# Patient Record
Sex: Female | Born: 1935 | Race: White | Hispanic: No | Smoking: Never smoker
Health system: Southern US, Community
[De-identification: ages and names within clinical notes are randomized; demographics above are authoritative.]

## PROBLEM LIST (undated history)

## (undated) DIAGNOSIS — D689 Coagulation defect, unspecified: Secondary | ICD-10-CM

## (undated) DIAGNOSIS — Z5189 Encounter for other specified aftercare: Secondary | ICD-10-CM

## (undated) DIAGNOSIS — K750 Abscess of liver: Secondary | ICD-10-CM

## (undated) DIAGNOSIS — I38 Endocarditis, valve unspecified: Secondary | ICD-10-CM

## (undated) DIAGNOSIS — I10 Essential (primary) hypertension: Secondary | ICD-10-CM

## (undated) DIAGNOSIS — E785 Hyperlipidemia, unspecified: Secondary | ICD-10-CM

## (undated) DIAGNOSIS — J189 Pneumonia, unspecified organism: Secondary | ICD-10-CM

## (undated) DIAGNOSIS — T7840XA Allergy, unspecified, initial encounter: Secondary | ICD-10-CM

## (undated) DIAGNOSIS — J45909 Unspecified asthma, uncomplicated: Secondary | ICD-10-CM

## (undated) DIAGNOSIS — F419 Anxiety disorder, unspecified: Secondary | ICD-10-CM

## (undated) DIAGNOSIS — S22009A Unspecified fracture of unspecified thoracic vertebra, initial encounter for closed fracture: Secondary | ICD-10-CM

## (undated) DIAGNOSIS — N3090 Cystitis, unspecified without hematuria: Secondary | ICD-10-CM

## (undated) DIAGNOSIS — M1990 Unspecified osteoarthritis, unspecified site: Secondary | ICD-10-CM

## (undated) DIAGNOSIS — M810 Age-related osteoporosis without current pathological fracture: Secondary | ICD-10-CM

## (undated) HISTORY — DX: Coagulation defect, unspecified: D68.9

## (undated) HISTORY — DX: Encounter for other specified aftercare: Z51.89

## (undated) HISTORY — DX: Anxiety disorder, unspecified: F41.9

## (undated) HISTORY — PX: COLONOSCOPY: SHX174

## (undated) HISTORY — PX: APPENDECTOMY: SHX54

## (undated) HISTORY — DX: Hyperlipidemia, unspecified: E78.5

## (undated) HISTORY — PX: FRACTURE SURGERY: SHX138

## (undated) HISTORY — DX: Unspecified asthma, uncomplicated: J45.909

## (undated) HISTORY — DX: Abscess of liver: K75.0

## (undated) HISTORY — PX: SHOULDER FUSION SURGERY: SHX775

## (undated) HISTORY — DX: Unspecified osteoarthritis, unspecified site: M19.90

## (undated) HISTORY — DX: Allergy, unspecified, initial encounter: T78.40XA

## (undated) HISTORY — DX: Age-related osteoporosis without current pathological fracture: M81.0

## (undated) HISTORY — DX: Endocarditis, valve unspecified: I38

---

## 2013-12-10 ENCOUNTER — Encounter (HOSPITAL_COMMUNITY): Payer: Self-pay | Admitting: Emergency Medicine

## 2013-12-10 ENCOUNTER — Emergency Department (HOSPITAL_COMMUNITY): Payer: Medicare Other

## 2013-12-10 ENCOUNTER — Ambulatory Visit (HOSPITAL_COMMUNITY): Payer: Medicare Other

## 2013-12-10 ENCOUNTER — Inpatient Hospital Stay (HOSPITAL_COMMUNITY)
Admission: EM | Admit: 2013-12-10 | Discharge: 2013-12-13 | DRG: 194 | Disposition: A | Discharge status: Home / Self Care | Payer: Medicare Other | Attending: Internal Medicine | Admitting: Internal Medicine

## 2013-12-10 DIAGNOSIS — I10 Essential (primary) hypertension: Secondary | ICD-10-CM | POA: Diagnosis present

## 2013-12-10 DIAGNOSIS — J1289 Other viral pneumonia: Secondary | ICD-10-CM | POA: Diagnosis present

## 2013-12-10 DIAGNOSIS — J101 Influenza due to other identified influenza virus with other respiratory manifestations: Secondary | ICD-10-CM | POA: Diagnosis present

## 2013-12-10 DIAGNOSIS — R112 Nausea with vomiting, unspecified: Secondary | ICD-10-CM

## 2013-12-10 DIAGNOSIS — E871 Hypo-osmolality and hyponatremia: Secondary | ICD-10-CM | POA: Diagnosis present

## 2013-12-10 DIAGNOSIS — Z79899 Other long term (current) drug therapy: Secondary | ICD-10-CM

## 2013-12-10 DIAGNOSIS — D72829 Elevated white blood cell count, unspecified: Secondary | ICD-10-CM | POA: Diagnosis present

## 2013-12-10 DIAGNOSIS — R197 Diarrhea, unspecified: Secondary | ICD-10-CM

## 2013-12-10 DIAGNOSIS — J1000 Influenza due to other identified influenza virus with unspecified type of pneumonia: Principal | ICD-10-CM | POA: Diagnosis present

## 2013-12-10 DIAGNOSIS — J159 Unspecified bacterial pneumonia: Secondary | ICD-10-CM | POA: Diagnosis present

## 2013-12-10 DIAGNOSIS — J189 Pneumonia, unspecified organism: Secondary | ICD-10-CM | POA: Diagnosis present

## 2013-12-10 HISTORY — DX: Essential (primary) hypertension: I10

## 2013-12-10 HISTORY — DX: Pneumonia, unspecified organism: J18.9

## 2013-12-10 HISTORY — DX: Cystitis, unspecified without hematuria: N30.90

## 2013-12-10 LAB — COMPREHENSIVE METABOLIC PANEL
ALT: 32 U/L (ref 0–35)
AST: 40 U/L — ABNORMAL HIGH (ref 0–37)
CO2: 26 mEq/L (ref 19–32)
Calcium: 9 mg/dL (ref 8.4–10.5)
Chloride: 89 mEq/L — ABNORMAL LOW (ref 96–112)
GFR calc non Af Amer: 78 mL/min — ABNORMAL LOW (ref >90)
Sodium: 126 mEq/L — ABNORMAL LOW (ref 135–145)

## 2013-12-10 LAB — URINE MICROSCOPIC-ADD ON

## 2013-12-10 LAB — CBC WITH DIFFERENTIAL/PLATELET
Eosinophils Relative: 0 % (ref 0–5)
Lymphocytes Relative: 6 % — ABNORMAL LOW (ref 12–46)
Monocytes Absolute: 1.4 10*3/uL — ABNORMAL HIGH (ref 0.1–1.0)
Monocytes Relative: 5 % (ref 3–12)
Neutro Abs: 24 10*3/uL — ABNORMAL HIGH (ref 1.7–7.7)
Neutrophils Relative %: 89 % — ABNORMAL HIGH (ref 43–77)
Platelets: 269 10*3/uL (ref 150–400)
RBC: 3.16 MIL/uL — ABNORMAL LOW (ref 3.87–5.11)
WBC: 27 10*3/uL — ABNORMAL HIGH (ref 4.0–10.5)

## 2013-12-10 LAB — URINALYSIS, ROUTINE W REFLEX MICROSCOPIC
Bilirubin Urine: NEGATIVE
Specific Gravity, Urine: 1.018 (ref 1.005–1.030)
Urobilinogen, UA: 1 mg/dL (ref 0.0–1.0)
pH: 6.5 (ref 5.0–8.0)

## 2013-12-10 LAB — INFLUENZA PANEL BY PCR (TYPE A & B): Influenza B By PCR: NEGATIVE

## 2013-12-10 MED ORDER — SODIUM CHLORIDE 0.9 % IV SOLN
INTRAVENOUS | Status: DC
  Administered 2013-12-10 – 2013-12-12 (×4): via INTRAVENOUS

## 2013-12-10 MED ORDER — ALBUTEROL SULFATE (2.5 MG/3ML) 0.083% IN NEBU
2.5000 mg | INHALATION_SOLUTION | Freq: Four times a day (QID) | RESPIRATORY_TRACT | Status: DC
  Administered 2013-12-10: 2.5 mg via RESPIRATORY_TRACT
  Filled 2013-12-10 (×2): qty 3

## 2013-12-10 MED ORDER — ENOXAPARIN SODIUM 40 MG/0.4ML ~~LOC~~ SOLN
40.0000 mg | SUBCUTANEOUS | Status: DC
  Administered 2013-12-10 – 2013-12-12 (×3): 40 mg via SUBCUTANEOUS
  Filled 2013-12-10 (×4): qty 0.4

## 2013-12-10 MED ORDER — HYDROCOD POLST-CHLORPHEN POLST 10-8 MG/5ML PO LQCR
5.0000 mL | Freq: Every day | ORAL | Status: DC
  Filled 2013-12-10 (×2): qty 5

## 2013-12-10 MED ORDER — SODIUM CHLORIDE 0.9 % IV SOLN: INTRAVENOUS | Status: DC

## 2013-12-10 MED ORDER — METOPROLOL SUCCINATE ER 25 MG PO TB24
25.0000 mg | ORAL_TABLET | Freq: Every day | ORAL | Status: DC
  Administered 2013-12-10 – 2013-12-13 (×4): 25 mg via ORAL
  Filled 2013-12-10 (×5): qty 1

## 2013-12-10 MED ORDER — DOXYCYCLINE HYCLATE 100 MG IV SOLR
100.0000 mg | Freq: Two times a day (BID) | INTRAVENOUS | Status: DC
  Administered 2013-12-10 – 2013-12-12 (×6): 100 mg via INTRAVENOUS
  Filled 2013-12-10 (×10): qty 100

## 2013-12-10 MED ORDER — NITROFURANTOIN MACROCRYSTAL 50 MG PO CAPS
50.0000 mg | ORAL_CAPSULE | ORAL | Status: DC
  Filled 2013-12-10: qty 1

## 2013-12-10 MED ORDER — ONDANSETRON HCL 4 MG/2ML IJ SOLN
4.0000 mg | Freq: Once | INTRAMUSCULAR | Status: AC
  Administered 2013-12-10: 4 mg via INTRAVENOUS
  Filled 2013-12-10: qty 2

## 2013-12-10 MED ORDER — OSELTAMIVIR PHOSPHATE 75 MG PO CAPS
75.0000 mg | ORAL_CAPSULE | Freq: Every day | ORAL | Status: DC
  Administered 2013-12-11: 75 mg via ORAL
  Filled 2013-12-10 (×3): qty 1

## 2013-12-10 MED ORDER — ONDANSETRON HCL 4 MG PO TABS: 4.0000 mg | ORAL_TABLET | Freq: Four times a day (QID) | ORAL | Status: DC | PRN

## 2013-12-10 MED ORDER — SODIUM CHLORIDE 0.9 % IV BOLUS (SEPSIS)
1000.0000 mL | Freq: Once | INTRAVENOUS | Status: AC
  Administered 2013-12-10: 1000 mL via INTRAVENOUS

## 2013-12-10 MED ORDER — ONDANSETRON HCL 4 MG/2ML IJ SOLN: 4.0000 mg | Freq: Four times a day (QID) | INTRAMUSCULAR | Status: DC | PRN

## 2013-12-10 MED ORDER — METOCLOPRAMIDE HCL 5 MG/ML IJ SOLN: 10.0000 mg | Freq: Four times a day (QID) | INTRAMUSCULAR | Status: DC | PRN

## 2013-12-10 MED ORDER — GUAIFENESIN ER 600 MG PO TB12
1200.0000 mg | ORAL_TABLET | Freq: Two times a day (BID) | ORAL | Status: DC
  Administered 2013-12-10 – 2013-12-13 (×7): 1200 mg via ORAL
  Filled 2013-12-10 (×8): qty 2

## 2013-12-10 MED ORDER — PROCHLORPERAZINE EDISYLATE 5 MG/ML IJ SOLN
10.0000 mg | INTRAMUSCULAR | Status: DC | PRN
Start: 2013-12-10 — End: 2013-12-13
  Administered 2013-12-10 – 2013-12-12 (×3): 10 mg via INTRAVENOUS
  Filled 2013-12-10 (×3): qty 2

## 2013-12-10 MED ORDER — PROMETHAZINE HCL 25 MG PO TABS: 12.5000 mg | ORAL_TABLET | Freq: Four times a day (QID) | ORAL | Status: DC | PRN

## 2013-12-10 MED ORDER — DEXTROSE 5 % IV SOLN
1.0000 g | INTRAVENOUS | Status: DC
  Administered 2013-12-10 – 2013-12-12 (×3): 1 g via INTRAVENOUS
  Filled 2013-12-10 (×4): qty 10

## 2013-12-10 MED ORDER — AMLODIPINE BESYLATE 5 MG PO TABS
5.0000 mg | ORAL_TABLET | Freq: Every day | ORAL | Status: DC
  Administered 2013-12-10 – 2013-12-13 (×4): 5 mg via ORAL
  Filled 2013-12-10 (×5): qty 1

## 2013-12-10 MED ORDER — PROMETHAZINE HCL 25 MG/ML IJ SOLN
12.5000 mg | Freq: Four times a day (QID) | INTRAMUSCULAR | Status: DC | PRN
  Administered 2013-12-10 – 2013-12-11 (×2): 12.5 mg via INTRAVENOUS
  Filled 2013-12-10 (×3): qty 1

## 2013-12-10 MED ORDER — IOHEXOL 300 MG/ML  SOLN
80.0000 mL | Freq: Once | INTRAMUSCULAR | Status: AC | PRN
  Administered 2013-12-10: 80 mL via INTRAVENOUS

## 2013-12-10 MED ORDER — PROMETHAZINE HCL 12.5 MG RE SUPP
12.5000 mg | Freq: Four times a day (QID) | RECTAL | Status: DC | PRN
  Filled 2013-12-10: qty 2

## 2013-12-10 MED ORDER — HEPARIN SODIUM (PORCINE) 5000 UNIT/ML IJ SOLN
5000.0000 [IU] | Freq: Three times a day (TID) | INTRAMUSCULAR | Status: DC
  Filled 2013-12-10 (×4): qty 1

## 2013-12-10 MED ORDER — OSELTAMIVIR PHOSPHATE 75 MG PO CAPS
75.0000 mg | ORAL_CAPSULE | Freq: Two times a day (BID) | ORAL | Status: DC
  Administered 2013-12-10: 75 mg via ORAL
  Filled 2013-12-10 (×2): qty 1

## 2013-12-10 NOTE — Progress Notes (Signed)
[facility] PROGRESS NOTE  Shonna Deiter ZOX:[id number] DOB: July 03, 1924 DOA: 12/10/2013 PCP: No primary provider on file.  Assessment/Plan:  Influenza PCR positive Low grade fever Tami flu  Bilateral CAP Pneumonia Patient with history of pneumonia CT scan significant for multilobar ground glass opacities On Rocephin and Doxy.  Allergic to Azith. Added Nebs, Mucinex, Tussionex.  Pancreatic / Kidney lesions Being followed by an MD in [place].   Will send copy of CT with patient at discharge for comparison to previous scans.   DVT Prophylaxis: lovenox  Code Status: full Family Communication: Dtr at bedside Disposition Plan: From [place].  Visiting daughter locally.   Antibiotics: Doxy rocephin   HPI/Subjective: Patient improving.  "I felt fine at christmas and got bad SOO quickly"  Objective: Filed Vitals:   12/10/13 0303 12/10/13 0727  BP: 153/69 154/72  Pulse: 105 115  Temp: 99.4 F (37.4 C) 99.9 F (37.7 C)  TempSrc: Oral Oral  Resp: 16 20  Height: 5\' 6"  (1.676 m)   Weight: 58.968 kg (130 lb)   SpO2: 95% 93%    Intake/Output Summary (Last 24 hours) at 12/10/13 1313 Last data filed at 12/10/13 0830  Gross per 24 hour  Intake      0 ml  Output    200 ml  Net   -200 ml   Filed Weights   12/10/13 0303  Weight: 58.968 kg (130 lb)    Exam: General: Well developed, well nourished, NAD, appears stated age  HEENT:  PERR, EOMI, Anicteic Sclera, MMM. No pharyngeal erythema or exudates  Neck: Supple, no JVD, no masses  Cardiovascular: RRR, S1 S2 auscultated, no rubs, murmurs or gallops.   Respiratory: bilateral wheeze, no accessory muscle use.  Hacking cough. Abdomen: Soft, nontender, nondistended, + bowel sounds  Extremities: warm dry without cyanosis clubbing or edema.  Neuro: AAOx3, cranial nerves grossly intact. Strength 5/5 in upper and lower extremities  Skin: Without rashes exudates or nodules.   Psych: Normal affect and demeanor with intact  judgement and insight       Data Reviewed: Basic Metabolic Panel:  Recent Labs Lab 12/10/13 0440  NA 126*  K 3.9  CL 89*  CO2 26  GLUCOSE 123*  BUN 12  CREATININE 0.64  CALCIUM 9.0   Liver Function Tests:  Recent Labs Lab 12/10/13 0440  AST 40*  ALT 32  ALKPHOS 60  BILITOT 3.5*  PROT 6.8  ALBUMIN 3.2*   CBC:  Recent Labs Lab 12/10/13 0440  WBC 27.0*  NEUTROABS 24.0*  HGB 10.2*  HCT 29.3*  MCV 92.7  PLT 269     Studies: Dg Chest 2 View  12/10/2013   CLINICAL DATA:  Flu symptoms.  EXAM: CHEST  2 VIEW  COMPARISON:  None available for comparison at time of study interpretation.  FINDINGS: The cardiac silhouette appears mildly enlarged, mediastinal silhouette is nonsuspicious ; mildly calcified aortic knob. Moderate chronic interstitial changes. 13 mm nodular density projects in right upper lobe. Patchy airspace opacity in right middle lobe. No pneumothorax.  Patient is osteopenic. Soft tissue planes are nonsuspicious. Mild symmetric calcifications in the neck are likely vascular.  IMPRESSION: Right middle lobe airspace opacity may reflect pneumonia. Recommend followup chest radiograph after treatment to verify improvement.  Mild cardiomegaly and moderate chronic interstitial changes.  13 mm right upper lobe nodule would be better characterized on CT of the chest as clinically indicated ; if not performed, recommend close attention to this area on follow-up imaging.   Electronically Signed  By: Awilda Metro   On: 12/10/2013 04:15   Ct Chest W Contrast  12/10/2013   CLINICAL DATA:  Nausea, vomiting and diarrhea for 2 days. Influenza. Right middle lobe airspace disease and pulmonary nodule on chest radiographs.  EXAM: CT CHEST WITH CONTRAST  TECHNIQUE: Multidetector CT imaging of the chest was performed during intravenous contrast administration.  CONTRAST:  80mL OMNIPAQUE IOHEXOL 300 MG/ML  SOLN  COMPARISON:  Chest radiographs 12/10/2013.  FINDINGS: Small right  hilar and subcarinal lymph nodes are likely reactive. No pathologically enlarged mediastinal, hilar or axillary lymph nodes are identified. There is mild atherosclerosis of the aorta, great vessels and coronary arteries.  Minimal pleural fluid is present on the right. There is no significant left pleural or pericardial effusion.  There are patchy airspace and ground-glass opacities in both lungs with areas of consolidation in the right upper, middle and lower lobes as well as the lingula. There is a 1.4 cm nodular density in the right middle lobe on image number 36. In the right lower lobe, there is a 6 mm nodular density on image 47. No endobronchial lesion is identified.  Images through the upper abdomen demonstrate no abnormality of the liver or adrenal glands. There are low density lesions within the pancreatic tail measuring 6 mm on image 5 of series 5 and 13 mm on image 2 of series 7. There is another smaller lesion within the pancreatic body on image number 4 of series 7. There is a 1.4 cm low-density lesion in the interpolar region of the left kidney, measuring 23 HU on image 4 of series 9.  Mild thoracic spondylosis is noted. There are no worrisome osseous findings.  IMPRESSION: 1. There are patchy airspace and ground-glass opacities in both lungs most consistent with multilobar pneumonia. Radiographic follow up recommended. If these opacities fail to resolve, CT follow-up may be warranted. 2. Indeterminate low-density lesions within the pancreatic tail and left kidney. In this 79 year old, these findings are of questionable significance. Management options include in further characterization with abdominal MRI or CT follow up in 6 months (which could be performed in conjunction with follow-up chest CT) to assess stability.   Electronically Signed   By: Roxy Horseman M.D.   On: 12/10/2013 10:38    Scheduled Meds: . albuterol  2.5 mg Nebulization Q6H  . amLODipine  5 mg Oral Daily  . cefTRIAXone  (ROCEPHIN)  IV  1 g Intravenous Q24H  . chlorpheniramine-HYDROcodone  5 mL Oral QHS  . doxycycline (VIBRAMYCIN) IV  100 mg Intravenous Q12H  . enoxaparin (LOVENOX) injection  40 mg Subcutaneous Q24H  . guaiFENesin  1,200 mg Oral BID  . metoprolol succinate  25 mg Oral Daily  . oseltamivir  75 mg Oral BID   Continuous Infusions: . sodium chloride 125 mL/hr at 12/10/13 1204    Principal Problem:   Influenza A (H1N1) Active Problems:   Nausea vomiting and diarrhea   CAP (community acquired pneumonia)    Conley Canal  [facility] Pager [phone]. If 7PM-7AM, please contact night-coverage at [email or web address], password [facility] 12/10/2013, 1:13 PM  LOS: 0 days

## 2013-12-10 NOTE — ED Notes (Signed)
Pt presents with to ED, having been diagnosed with flu in [facility] 3 days ago, c/o n/v and weakness.

## 2013-12-10 NOTE — ED Notes (Signed)
LACTIC ACID RESULTS GIVEN TO EDP [place] FOR REVIEW

## 2013-12-10 NOTE — ED Notes (Signed)
Symptoms started on Saturday. DX flu.. N/V/D started thereafter. Unable to tolerate PO. Here to be evaluated

## 2013-12-10 NOTE — H&P (Signed)
[facility] History and Physical  Artha Stavros ZOX:[id number] DOB: 21-Dec-1923 DOA: 12/10/2013  Referring physician: EDP PCP: No primary provider on file.   Chief Complaint: Influenza, N/V/D   HPI: Brianna [name] is a 79 y.o. female who presents to the ED with uncontrolled N/V/D.  She has been having just over 48 hours of ILI symptoms including cough, rhinorrhea, sore throat.  No blood in stool or vomit, no melena.  This occurs in the context of laboratory confirmed influenza A H1N1 virus infection as seen in an ED in [place] yesterday.  Unfortunately, the pharmacy she tried to fill her tamiflu prescription at was out of tamiflu due to a massive outbreak of influenza in the local area.  Her N/V/D onset after receiving Levaquin in the ED yesterday and has been persistent since that time.  Levaquin was given to treat presumptive PNA seen on CXR.  Review of Systems: Systems reviewed.  As above, otherwise negative  Past Medical History  Diagnosis Date  . Hypertension   . Pneumonia   . Bladder infection    Past Surgical History  Procedure Laterality Date  . Joint replacement    . Appendectomy     Social History:  reports that she has never smoked. She does not have any smokeless tobacco history on file. She reports that she drinks alcohol. Her drug history is not on file.  Allergies  Allergen Reactions  . Erythromycin     "passed out"    No family history on file.   Prior to Admission medications   Medication Sig Start Date End Date Taking? Authorizing Provider  amLODipine (NORVASC) 5 MG tablet Take 5 mg by mouth daily.  10/01/13  Yes Historical Provider, MD  metoprolol succinate (TOPROL-XL) 25 MG 24 hr tablet Take 25 mg by mouth daily.  10/01/13  Yes Historical Provider, MD  nitrofurantoin (MACRODANTIN) 50 MG capsule Take 50 mg by mouth 3 (three) times a week. Take on Mondays Wednesday and Fridays-Name brand only   Yes Historical Provider, MD  ondansetron (ZOFRAN-ODT)  4 MG disintegrating tablet Take 4 mg by mouth every 8 (eight) hours as needed.  12/08/13  Yes Historical Provider, MD   Physical Exam: Filed Vitals:   12/10/13 0303  BP: 153/69  Pulse: 105  Temp: 99.4 F (37.4 C)  Resp: 16    BP 153/69  Pulse 105  Temp(Src) 99.4 F (37.4 C) (Oral)  Resp 16  Ht 5\' 6"  (1.676 m)  Wt 58.968 kg (130 lb)  BMI 20.99 kg/m2  SpO2 95%  General Appearance:    Alert, oriented, no distress, appears stated age  Head:    Normocephalic, atraumatic  Eyes:    PERRL, EOMI, sclera non-icteric        Nose:   Nares without drainage or epistaxis. Mucosa, turbinates normal  Throat:   Moist mucous membranes. Oropharynx without erythema or exudate.  Neck:   Supple. No carotid bruits.  No thyromegaly.  No lymphadenopathy.   Back:     No CVA tenderness, no spinal tenderness  Lungs:     Clear to auscultation bilaterally, without wheezes, rhonchi or rales  Chest wall:    No tenderness to palpitation  Heart:    Regular rate and rhythm without murmurs, gallops, rubs  Abdomen:     Soft, non-tender, nondistended, normal bowel sounds, no organomegaly  Genitalia:    deferred  Rectal:    deferred  Extremities:   No clubbing, cyanosis or edema.  Pulses:   2+ and symmetric  all extremities  Skin:   Skin color, texture, turgor normal, no rashes or lesions  Lymph nodes:   Cervical, supraclavicular, and axillary nodes normal  Neurologic:   CNII-XII intact. Normal strength, sensation and reflexes      throughout    Labs on Admission:  Basic Metabolic Panel:  Recent Labs Lab 12/10/13 0440  NA 126*  K 3.9  CL 89*  CO2 26  GLUCOSE 123*  BUN 12  CREATININE 0.64  CALCIUM 9.0   Liver Function Tests:  Recent Labs Lab 12/10/13 0440  AST 40*  ALT 32  ALKPHOS 60  BILITOT 3.5*  PROT 6.8  ALBUMIN 3.2*   No results found for this basename: LIPASE, AMYLASE,  in the last 168 hours No results found for this basename: AMMONIA,  in the last 168 hours CBC: No results  found for this basename: WBC, NEUTROABS, HGB, HCT, MCV, PLT,  in the last 168 hours Cardiac Enzymes: No results found for this basename: CKTOTAL, CKMB, CKMBINDEX, TROPONINI,  in the last 168 hours  BNP (last 3 results) No results found for this basename: PROBNP,  in the last 8760 hours CBG: No results found for this basename: GLUCAP,  in the last 168 hours  Radiological Exams on Admission: Dg Chest 2 View  12/10/2013   CLINICAL DATA:  Flu symptoms.  EXAM: CHEST  2 VIEW  COMPARISON:  None available for comparison at time of study interpretation.  FINDINGS: The cardiac silhouette appears mildly enlarged, mediastinal silhouette is nonsuspicious ; mildly calcified aortic knob. Moderate chronic interstitial changes. 13 mm nodular density projects in right upper lobe. Patchy airspace opacity in right middle lobe. No pneumothorax.  Patient is osteopenic. Soft tissue planes are nonsuspicious. Mild symmetric calcifications in the neck are likely vascular.  IMPRESSION: Right middle lobe airspace opacity may reflect pneumonia. Recommend followup chest radiograph after treatment to verify improvement.  Mild cardiomegaly and moderate chronic interstitial changes.  13 mm right upper lobe nodule would be better characterized on CT of the chest as clinically indicated ; if not performed, recommend close attention to this area on follow-up imaging.   Electronically Signed   By: Awilda Metro   On: 12/10/2013 04:15    EKG: Independently reviewed.  Assessment/Plan Principal Problem:   Influenza A (H1N1) Active Problems:   Nausea vomiting and diarrhea   CAP (community acquired pneumonia)   1. Influenza A H1N1 - lab confirmed, starting tamiflu. 2. N/V/D - either due to upset stomach from levaquin or due to problem #1, will hold levaquin and use rocephin instead to treat PNA.  IVF, monitor intake and output especially as patient reports very poor UOP recently and no PO fluid intake. 3. CAP - most likely due  to influenza A H1N1 but will empirically cover bacterial organisms as well given WBC 27k, using rocephin and doxycycline (patient allergic to erythromycin, and dont want to use a fluroquinolone since that seems to have set off her N/V/D yesterday).    Code Status: Full  Family Communication: Daughter at bedside Disposition Plan: Admit to inpatient   Time spent: 70 min  GARDNER, JARED M. [facility] Pager [phone]  If 7AM-7PM, please contact the day team taking care of the patient [email or web address] Password [facility] 12/10/2013, 5:39 AM

## 2013-12-10 NOTE — ED Provider Notes (Signed)
CSN: [id number]     Arrival date & time 12/10/13  0246 History   First MD Initiated Contact with Patient 12/10/13 0258     Chief Complaint  Patient presents with  . Influenza  . Nausea  . Diarrhea  . Emesis  . Abdominal Pain   (Consider location/radiation/quality/duration/timing/severity/associated sxs/prior Treatment) HPI Comments: 79 year old female presents with uncontrolled nausea and vomiting. She has been having just over 48 hours of flulike symptoms. These include cough, rhinorrhea, sore throat, as well as nausea and vomiting and diarrhea. There are no bloody stools. She feels some crampy abdominal pain that is worse when she coughs. She's not have any shortness of breath. She was seen in [place] in the ED yesterday and diagnosed with influenza. She is while positive for H1N1. Unfortunately when she went to pharmacy they had run out of Tamiflu. She did pick up Levaquin with which they gave her to treat presumptively for pneumonia. She vomited approximately 45 minutes after taking her first dose today. She's been taking ODT Zofran at home without success. She took up to 8 mg a time but is still having vomiting.   Past Medical History  Diagnosis Date  . Hypertension   . Pneumonia   . Bladder infection    Past Surgical History  Procedure Laterality Date  . Joint replacement    . Appendectomy     No family history on file. History  Substance Use Topics  . Smoking status: Never Smoker   . Smokeless tobacco: Not on file  . Alcohol Use: Yes   OB History   Grav Para Term Preterm Abortions TAB SAB Ect Mult Living                 Review of Systems  Constitutional: Positive for fever.  HENT: Positive for rhinorrhea and sore throat.   Respiratory: Positive for cough. Negative for shortness of breath.   Gastrointestinal: Positive for nausea, vomiting, abdominal pain and diarrhea. Negative for blood in stool.  Genitourinary: Positive for decreased urine volume. Negative for  dysuria.       Dark urine  Neurological: Positive for weakness.  All other systems reviewed and are negative.    Allergies  Erythromycin  Home Medications   Current Outpatient Rx  Name  Route  Sig  Dispense  Refill  . amLODipine (NORVASC) 5 MG tablet   Oral   Take 5 mg by mouth daily.          . metoprolol succinate (TOPROL-XL) 25 MG 24 hr tablet   Oral   Take 25 mg by mouth daily.          . nitrofurantoin (MACRODANTIN) 50 MG capsule   Oral   Take 50 mg by mouth 3 (three) times a week. Take on Mondays Wednesday and Fridays-Name brand only         . ondansetron (ZOFRAN-ODT) 4 MG disintegrating tablet   Oral   Take 4 mg by mouth every 8 (eight) hours as needed.           BP 153/69  Pulse 105  Temp(Src) 99.4 F (37.4 C) (Oral)  Resp 16  Ht 5\' 6"  (1.676 m)  Wt 130 lb (58.968 kg)  BMI 20.99 kg/m2  SpO2 95% Physical Exam  Nursing note and vitals reviewed. Constitutional: She is oriented to person, place, and time. She appears well-developed and well-nourished.  HENT:  Head: Normocephalic and atraumatic.  Right Ear: External ear normal.  Left Ear: External ear normal.  Nose:  Nose normal.  Eyes: Right eye exhibits no discharge. Left eye exhibits no discharge.  Cardiovascular: Regular rhythm and normal heart sounds.  Tachycardia present.   Pulmonary/Chest: Effort normal and breath sounds normal.  Abdominal: Soft. There is tenderness (mild).  Neurological: She is alert and oriented to person, place, and time.  Skin: Skin is warm and dry.    ED Course  Procedures (including critical care time) Labs Review Labs Reviewed  CBC WITH DIFFERENTIAL - Abnormal; Notable for the following:    WBC 27.0 (*)    RBC 3.16 (*)    Hemoglobin 10.2 (*)    HCT 29.3 (*)    Neutrophils Relative % 89 (*)    Lymphocytes Relative 6 (*)    Neutro Abs 24.0 (*)    Monocytes Absolute 1.4 (*)    All other components within normal limits  COMPREHENSIVE METABOLIC PANEL - Abnormal;  Notable for the following:    Sodium 126 (*)    Chloride 89 (*)    Glucose, Bld 123 (*)    Albumin 3.2 (*)    AST 40 (*)    Total Bilirubin 3.5 (*)    GFR calc non Af Amer 78 (*)    All other components within normal limits  URINALYSIS, ROUTINE W REFLEX MICROSCOPIC - Abnormal; Notable for the following:    Hgb urine dipstick MODERATE (*)    Ketones, ur 40 (*)    Protein, ur 30 (*)    Leukocytes, UA TRACE (*)    All other components within normal limits  URINE MICROSCOPIC-ADD ON  CG4 I-STAT (LACTIC ACID)   Imaging Review Dg Chest 2 View  12/10/2013   CLINICAL DATA:  Flu symptoms.  EXAM: CHEST  2 VIEW  COMPARISON:  None available for comparison at time of study interpretation.  FINDINGS: The cardiac silhouette appears mildly enlarged, mediastinal silhouette is nonsuspicious ; mildly calcified aortic knob. Moderate chronic interstitial changes. 13 mm nodular density projects in right upper lobe. Patchy airspace opacity in right middle lobe. No pneumothorax.  Patient is osteopenic. Soft tissue planes are nonsuspicious. Mild symmetric calcifications in the neck are likely vascular.  IMPRESSION: Right middle lobe airspace opacity may reflect pneumonia. Recommend followup chest radiograph after treatment to verify improvement.  Mild cardiomegaly and moderate chronic interstitial changes.  13 mm right upper lobe nodule would be better characterized on CT of the chest as clinically indicated ; if not performed, recommend close attention to this area on follow-up imaging.   Electronically Signed   By: Awilda Metro   On: 12/10/2013 04:15    EKG Interpretation   None       MDM   1. Influenza A (H1N1)   2. Nausea vomiting and diarrhea   3. CAP (community acquired pneumonia)    Patient will need to be admitted for fluids and nausea control. Discussed with Dr. Julian Reil who will admit. Patient tested positive for H1N1 yesterday. Discussed chest x-ray results with the hospitalist who  recommended holding off on antibiotics at this time as we know she has influenza    Audree Camel, MD 12/10/13 [phone]

## 2013-12-10 NOTE — ED Notes (Signed)
Family at bedside. 

## 2013-12-10 NOTE — Progress Notes (Signed)
[facility] PROGRESS NOTE  Ilya Ess ZOX:[id number] DOB: January 26, 1924 DOA: 12/10/2013 PCP: No primary provider on file.  Brief narrative: 79 y.o. female who presented to the [facility] ED 12/10/2013 with uncontrolled N/V/D. She was found to have positive influenza test was started on tamiflu. In addition, her CXR was significant for right middle lobe airspace opacity concerning for pneumonia. She was additionally started on doxycyline and rocephin. Her WBC count was elevated at 27 and hemoglobin was 1. Sodium was 126 on the admission. Further evaluation included CT chest which was significant for patchy airspace and ground-glass opacities in both lungs most consistent with multilobar pneumonia.There were also indeterminate low-density lesions within the pancreatic tail and left kidney of questionable significance.   Assessment/Plan:  Principal Problem:   Influenza A (H1N1) - on tamiflu - continue supportive care with IV fluids, antiemetics (zofran and phenergan did not help so we started compazine) Active Problems:   Nausea vomiting and diarrhea - likely due to viral illness - continue IV fluids, antiemetics    CAP (community acquired pneumonia) - blood cultures not obtained at the time of the admission - check legionella and strep pneumonia   Leukocytosis - due to viral and bacterial pneumonia - management as above    Hyponatremia - likely dehydration - continue IV fluids - follow up BMP in am   Code Status: full code Family Communication: daughter at the bedside Disposition Plan: home when stable   Manson Passey, MD  [facility] Pager [phone]  If 7PM-7AM, please contact night-coverage [email or web address] Password [email or web address] 12/10/2013, 4:09 PM   LOS: 0 days   Consultants:  None   Procedures:  None   Antibiotics:  Rocephin 12/10/2013 -->  Doxycycline 12/10/2013 -->  Tamiflu 12/10/2013 -->  HPI/Subjective: Still congested, nauseous..   Objective: Filed  Vitals:   12/10/13 0303 12/10/13 0727 12/10/13 1455  BP: 153/69 154/72 156/65  Pulse: 105 115 84  Temp: 99.4 F (37.4 C) 99.9 F (37.7 C) 99 F (37.2 C)  TempSrc: Oral Oral Oral  Resp: 16 20 18   Height: 5\' 6"  (1.676 m)    Weight: 58.968 kg (130 lb)    SpO2: 95% 93% 91%    Intake/Output Summary (Last 24 hours) at 12/10/13 1609 Last data filed at 12/10/13 1457  Gross per 24 hour  Intake    480 ml  Output    400 ml  Net     80 ml    Exam:   General:  Pt is alert, follows commands appropriately, not in acute distress  Cardiovascular: Regular rhythm, tachycardic, S1/S2 appreciated   Respiratory: Congested, rhodochrous throughout   Abdomen: Soft, non tender, non distended, bowel sounds present, no guarding  Extremities: No edema, pulses DP and PT palpable bilaterally  Neuro: Grossly nonfocal  Data Reviewed: Basic Metabolic Panel:  Recent Labs Lab 12/10/13 0440  NA 126*  K 3.9  CL 89*  CO2 26  GLUCOSE 123*  BUN 12  CREATININE 0.64  CALCIUM 9.0   Liver Function Tests:  Recent Labs Lab 12/10/13 0440  AST 40*  ALT 32  ALKPHOS 60  BILITOT 3.5*  PROT 6.8  ALBUMIN 3.2*   No results found for this basename: LIPASE, AMYLASE,  in the last 168 hours No results found for this basename: AMMONIA,  in the last 168 hours CBC:  Recent Labs Lab 12/10/13 0440  WBC 27.0*  NEUTROABS 24.0*  HGB 10.2*  HCT 29.3*  MCV 92.7  PLT 269   Cardiac Enzymes: No results found  for this basename: CKTOTAL, CKMB, CKMBINDEX, TROPONINI,  in the last 168 hours BNP: No components found with this basename: POCBNP,  CBG: No results found for this basename: GLUCAP,  in the last 168 hours  No results found for this or any previous visit (from the past 240 hour(s)).   Studies: Dg Chest 2 View 12/10/2013     IMPRESSION: Right middle lobe airspace opacity may reflect pneumonia. Recommend followup chest radiograph after treatment to verify improvement.  Mild cardiomegaly and  moderate chronic interstitial changes.  13 mm right upper lobe nodule would be better characterized on CT of the chest as clinically indicated ; if not performed, recommend close attention to this area on follow-up imaging.   Electronically Signed   By: Awilda Metro   On: 12/10/2013 04:15   Ct Chest W Contrast 12/10/2013    IMPRESSION: 1. There are patchy airspace and ground-glass opacities in both lungs most consistent with multilobar pneumonia. Radiographic follow up recommended. If these opacities fail to resolve, CT follow-up may be warranted. 2. Indeterminate low-density lesions within the pancreatic tail and left kidney. In this 79 year old, these findings are of questionable significance. Management options include in further characterization with abdominal MRI or CT follow up in 6 months (which could be performed in conjunction with follow-up chest CT) to assess stability.      Scheduled Meds: . albuterol  2.5 mg Nebulization Q6H  . amLODipine  5 mg Oral Daily  . cefTRIAXone (ROCEPHIN)  IV  1 g Intravenous Q24H  . chlorpheniramine-HYDROcodone  5 mL Oral QHS  . doxycycline (VIBRAMYCIN) IV  100 mg Intravenous Q12H  . enoxaparin (LOVENOX) injection  40 mg Subcutaneous Q24H  . guaiFENesin  1,200 mg Oral BID  . metoprolol succinate  25 mg Oral Daily  . oseltamivir  75 mg Oral BID   Continuous Infusions: . sodium chloride 125 mL/hr at 12/10/13 1204

## 2013-12-11 LAB — BASIC METABOLIC PANEL
BUN: 6 mg/dL (ref 6–23)
Calcium: 8.5 mg/dL (ref 8.4–10.5)
Chloride: 97 mEq/L (ref 96–112)
GFR calc Af Amer: >90 mL/min (ref >90)
GFR calc non Af Amer: 82 mL/min — ABNORMAL LOW (ref >90)
Glucose, Bld: 110 mg/dL — ABNORMAL HIGH (ref 70–99)
Potassium: 4 mEq/L (ref 3.7–5.3)
Sodium: 133 mEq/L — ABNORMAL LOW (ref 137–147)

## 2013-12-11 LAB — STREP PNEUMONIAE URINARY ANTIGEN: Strep Pneumo Urinary Antigen: NEGATIVE

## 2013-12-11 LAB — CBC
HCT: 28 % — ABNORMAL LOW (ref 36.0–46.0)
Hemoglobin: 9.6 g/dL — ABNORMAL LOW (ref 12.0–15.0)
MCH: 32.3 pg (ref 26.0–34.0)
Platelets: 291 10*3/uL (ref 150–400)
RBC: 2.97 MIL/uL — ABNORMAL LOW (ref 3.87–5.11)

## 2013-12-11 MED ORDER — BENZONATATE 100 MG PO CAPS
200.0000 mg | ORAL_CAPSULE | Freq: Two times a day (BID) | ORAL | Status: DC | PRN
  Administered 2013-12-11 – 2013-12-13 (×5): 200 mg via ORAL
  Filled 2013-12-11 (×4): qty 2

## 2013-12-11 MED ORDER — PROMETHAZINE HCL 25 MG/ML IJ SOLN: 6.2500 mg | Freq: Four times a day (QID) | INTRAMUSCULAR | Status: DC | PRN

## 2013-12-11 MED ORDER — DIPHENHYDRAMINE HCL 25 MG PO CAPS
25.0000 mg | ORAL_CAPSULE | Freq: Every day | ORAL | Status: DC
  Administered 2013-12-11: 25 mg via ORAL
  Filled 2013-12-11 (×4): qty 1

## 2013-12-11 MED ORDER — ALBUTEROL SULFATE (2.5 MG/3ML) 0.083% IN NEBU
2.5000 mg | INHALATION_SOLUTION | Freq: Three times a day (TID) | RESPIRATORY_TRACT | Status: DC
  Administered 2013-12-11 (×3): 2.5 mg via RESPIRATORY_TRACT
  Filled 2013-12-11 (×3): qty 3

## 2013-12-11 MED ORDER — ALBUTEROL SULFATE (2.5 MG/3ML) 0.083% IN NEBU: 2.5000 mg | INHALATION_SOLUTION | RESPIRATORY_TRACT | Status: DC | PRN

## 2013-12-11 NOTE — Progress Notes (Signed)
[facility] PROGRESS NOTE  Brianna [name] EAV:[id number] DOB: March 13, 1924 DOA: 12/10/2013 PCP: No primary provider on file.  Brief narrative: 79 y.o. female who presented to the [facility] ED 12/10/2013 with uncontrolled N/V/D. She was found to have positive influenza test was started on tamiflu. In addition, her CXR was significant for right middle lobe airspace opacity concerning for pneumonia. She was additionally started on doxycyline and rocephin. Her WBC count was elevated at 27 and hemoglobin was 1. Sodium was 126 on the admission. Further evaluation included CT chest which was significant for patchy airspace and ground-glass opacities in both lungs most consistent with multilobar pneumonia.There were also indeterminate low-density lesions within the pancreatic tail and left kidney of questionable significance.   Assessment/Plan:   Principal Problem:  Influenza A (H1N1)  - on tamiflu  - continue supportive care with IV fluids, antiemetics  - diet as tolerated   Active Problems:  Nausea vomiting and diarrhea  - likely due to viral illness  - continue IV fluids, antiemetics  - feels better this am CAP (community acquired pneumonia)  - blood cultures not obtained at the time of the admission  - legionella pending and strep pneumonia negative Leukocytosis  - due to viral and bacterial pneumonia  - management as above  Hyponatremia  - likely dehydration  - continue IV fluids  - sodium improve and 133 this am   Code Status: full code  Family Communication: daughter at the bedside  Disposition Plan: home when stable   Consultants:  None  Procedures:  None  Antibiotics:  Rocephin 12/10/2013 -->  Doxycycline 12/10/2013 -->  Tamiflu 12/10/2013 -->   Manson Passey, MD  [facility] Pager [phone]  If 7PM-7AM, please contact night-coverage [email or web address] Password [facility] 12/11/2013, 5:51 PM   LOS: 1 day    HPI/Subjective: Feels congested with dry cough but no  nausea or vomiting.   Objective: Filed Vitals:   12/11/13 0600 12/11/13 0853 12/11/13 1427 12/11/13 1445  BP: 136/74  135/74   Pulse: 100  95   Temp: 98.4 F (36.9 C)  98.4 F (36.9 C)   TempSrc: Oral  Oral   Resp: 20  18   Height:      Weight:      SpO2: 93% 95% 94% 94%    Intake/Output Summary (Last 24 hours) at 12/11/13 1751 Last data filed at 12/11/13 1428  Gross per 24 hour  Intake 2762.92 ml  Output      0 ml  Net 2762.92 ml    Exam:   General:  Pt is alert, follows commands appropriately, not in acute distress  Cardiovascular: Regular rate and rhythm, S1/S2, no murmurs, no rubs, no gallops  Respiratory: Congested but no wheezing   Abdomen: Soft, non tender, non distended, bowel sounds present, no guarding  Extremities: No edema, pulses DP and PT palpable bilaterally  Neuro: Grossly nonfocal  Data Reviewed: Basic Metabolic Panel:  Recent Labs Lab 12/10/13 0440 12/11/13 0540  NA 126* 133*  K 3.9 4.0  CL 89* 97  CO2 26 24  GLUCOSE 123* 110*  BUN 12 6  CREATININE 0.64 0.54  CALCIUM 9.0 8.5   Liver Function Tests:  Recent Labs Lab 12/10/13 0440  AST 40*  ALT 32  ALKPHOS 60  BILITOT 3.5*  PROT 6.8  ALBUMIN 3.2*   No results found for this basename: LIPASE, AMYLASE,  in the last 168 hours No results found for this basename: AMMONIA,  in the last 168 hours CBC:  Recent  Labs Lab 12/10/13 0440 12/11/13 0540  WBC 27.0* 17.5*  NEUTROABS 24.0*  --   HGB 10.2* 9.6*  HCT 29.3* 28.0*  MCV 92.7 94.3  PLT 269 291   Cardiac Enzymes: No results found for this basename: CKTOTAL, CKMB, CKMBINDEX, TROPONINI,  in the last 168 hours BNP: No components found with this basename: POCBNP,  CBG: No results found for this basename: GLUCAP,  in the last 168 hours  No results found for this or any previous visit (from the past 240 hour(s)).   Studies: Dg Chest 2 View  12/10/2013   CLINICAL DATA:  Flu symptoms.  EXAM: CHEST  2 VIEW  COMPARISON:   None available for comparison at time of study interpretation.  FINDINGS: The cardiac silhouette appears mildly enlarged, mediastinal silhouette is nonsuspicious ; mildly calcified aortic knob. Moderate chronic interstitial changes. 13 mm nodular density projects in right upper lobe. Patchy airspace opacity in right middle lobe. No pneumothorax.  Patient is osteopenic. Soft tissue planes are nonsuspicious. Mild symmetric calcifications in the neck are likely vascular.  IMPRESSION: Right middle lobe airspace opacity may reflect pneumonia. Recommend followup chest radiograph after treatment to verify improvement.  Mild cardiomegaly and moderate chronic interstitial changes.  13 mm right upper lobe nodule would be better characterized on CT of the chest as clinically indicated ; if not performed, recommend close attention to this area on follow-up imaging.   Electronically Signed   By: Awilda Metro   On: 12/10/2013 04:15   Ct Chest W Contrast  12/10/2013   CLINICAL DATA:  Nausea, vomiting and diarrhea for 2 days. Influenza. Right middle lobe airspace disease and pulmonary nodule on chest radiographs.  EXAM: CT CHEST WITH CONTRAST  TECHNIQUE: Multidetector CT imaging of the chest was performed during intravenous contrast administration.  CONTRAST:  80mL OMNIPAQUE IOHEXOL 300 MG/ML  SOLN  COMPARISON:  Chest radiographs 12/10/2013.  FINDINGS: Small right hilar and subcarinal lymph nodes are likely reactive. No pathologically enlarged mediastinal, hilar or axillary lymph nodes are identified. There is mild atherosclerosis of the aorta, great vessels and coronary arteries.  Minimal pleural fluid is present on the right. There is no significant left pleural or pericardial effusion.  There are patchy airspace and ground-glass opacities in both lungs with areas of consolidation in the right upper, middle and lower lobes as well as the lingula. There is a 1.4 cm nodular density in the right middle lobe on image number  36. In the right lower lobe, there is a 6 mm nodular density on image 47. No endobronchial lesion is identified.  Images through the upper abdomen demonstrate no abnormality of the liver or adrenal glands. There are low density lesions within the pancreatic tail measuring 6 mm on image 5 of series 5 and 13 mm on image 2 of series 7. There is another smaller lesion within the pancreatic body on image number 4 of series 7. There is a 1.4 cm low-density lesion in the interpolar region of the left kidney, measuring 23 HU on image 4 of series 9.  Mild thoracic spondylosis is noted. There are no worrisome osseous findings.  IMPRESSION: 1. There are patchy airspace and ground-glass opacities in both lungs most consistent with multilobar pneumonia. Radiographic follow up recommended. If these opacities fail to resolve, CT follow-up may be warranted. 2. Indeterminate low-density lesions within the pancreatic tail and left kidney. In this 79 year old, these findings are of questionable significance. Management options include in further characterization with abdominal MRI or CT  follow up in 6 months (which could be performed in conjunction with follow-up chest CT) to assess stability.   Electronically Signed   By: Roxy Horseman M.D.   On: 12/10/2013 10:38    Scheduled Meds: . albuterol  2.5 mg Nebulization TID  . amLODipine  5 mg Oral Daily  . cefTRIAXone (ROCEPHIN)  IV  1 g Intravenous Q24H  . chlorpheniramine-HYDROcodone  5 mL Oral QHS  . diphenhydrAMINE  25 mg Oral QHS  . doxycycline (VIBRAMYCIN) IV  100 mg Intravenous Q12H  . enoxaparin (LOVENOX) injection  40 mg Subcutaneous Q24H  . guaiFENesin  1,200 mg Oral BID  . metoprolol succinate  25 mg Oral Daily  . oseltamivir  75 mg Oral Daily   Continuous Infusions: . sodium chloride 75 mL/hr at 12/10/13 1829

## 2013-12-11 NOTE — Care Management Note (Signed)
    Page 1 of 1   12/11/2013     2:05:45 PM   CARE MANAGEMENT NOTE 12/11/2013  Patient:  Brianna [name], Brianna [name]   Account Number:  [id number]  Date Initiated:  12/11/2013  Documentation initiated by:  Lanier Clam  Subjective/Objective Assessment:   79 Y/O F ADMITTED W/INFLUENZA, N/V/D.     Action/Plan:   FROM HOME, ALONE.DTR-NURSE.   Anticipated DC Date:  12/14/2013   Anticipated DC Plan:  HOME W HOME HEALTH SERVICES      DC Planning Services  CM consult      Choice offered to / List presented to:             Status of service:  In process, will continue to follow Medicare Important Message given?   (If response is "NO", the following Medicare IM given date fields will be blank) Date Medicare IM given:   Date Additional Medicare IM given:    Discharge Disposition:    Per UR Regulation:  Reviewed for med. necessity/level of care/duration of stay  If discussed at Long Length of Stay Meetings, dates discussed:    Comments:  12/11/13 Challen Spainhour RN,BSN NCM [phone] WOULD RECOMMEND PT/OT CONS.

## 2013-12-12 LAB — COMPREHENSIVE METABOLIC PANEL
ALT: 67 U/L — ABNORMAL HIGH (ref 0–35)
Alkaline Phosphatase: 82 U/L (ref 39–117)
BUN: 7 mg/dL (ref 6–23)
Calcium: 8.4 mg/dL (ref 8.4–10.5)
GFR calc Af Amer: >90 mL/min (ref >90)
GFR calc non Af Amer: 81 mL/min — ABNORMAL LOW (ref >90)
Glucose, Bld: 120 mg/dL — ABNORMAL HIGH (ref 70–99)
Potassium: 3.3 mEq/L — ABNORMAL LOW (ref 3.7–5.3)
Total Protein: 6.6 g/dL (ref 6.0–8.3)

## 2013-12-12 MED ORDER — HYDROCOD POLST-CHLORPHEN POLST 10-8 MG/5ML PO LQCR
5.0000 mL | Freq: Two times a day (BID) | ORAL | Status: DC
  Administered 2013-12-12 (×2): 5 mL via ORAL
  Filled 2013-12-12 (×2): qty 5

## 2013-12-12 MED ORDER — DOCUSATE SODIUM 100 MG PO CAPS
100.0000 mg | ORAL_CAPSULE | Freq: Two times a day (BID) | ORAL | Status: DC
  Administered 2013-12-12 – 2013-12-13 (×2): 100 mg via ORAL
  Filled 2013-12-12 (×4): qty 1

## 2013-12-12 NOTE — Progress Notes (Signed)
[facility] PROGRESS NOTE  Brianna [name] ZOX:[id number] DOB: [birth year] DOA: 12/10/2013 PCP: No primary provider on file.  Brief narrative: 79 y.o. female who presented to the [facility] ED 12/10/2013 with uncontrolled N/V/D. She was found to have positive influenza test was started on tamiflu. In addition, her CXR was significant for right middle lobe airspace opacity concerning for pneumonia. She was additionally started on doxycyline and rocephin. Her WBC count was elevated at 27 and hemoglobin was 1. Sodium was 126 on the admission. Further evaluation included CT chest which was significant for patchy airspace and ground-glass opacities in both lungs most consistent with multilobar pneumonia.There were also indeterminate low-density lesions within the pancreatic tail and left kidney of questionable significance.   Assessment/Plan:   Principal Problem:  Influenza A (H1N1)  - never got tamiflu because she vomited  - flu test negative on this admission - continue supportive care with antiemetics  - diet as tolerated   Active Problems:  Nausea vomiting and diarrhea  - likely due to viral illness  - continue IV fluids, antiemetics  CAP (community acquired pneumonia)  - blood cultures not obtained at the time of the admission  - legionella and strep pneumonia negative  Leukocytosis  - due to viral and bacterial pneumonia  - management as above  Hyponatremia  - likely dehydration   - sodium improved  Code Status: full code  Family Communication: daughter at the bedside  Disposition Plan: home when stable   Consultants:  None  Procedures:  None  Antibiotics:  Rocephin 12/10/2013 -->  Doxycycline 12/10/2013 -->  Tamiflu 12/10/2013 -->  Manson Passey, MD  [facility] Pager [phone]  If 7PM-7AM, please contact night-coverage [email or web address] Password [email or web address] 12/12/2013, 5:10 PM   LOS: 2 days   HPI/Subjective: Still congested, dry cough.   Objective: Filed Vitals:    12/11/13 1947 12/11/13 2116 12/12/13 0610 12/12/13 1513  BP:  149/69 156/76 159/67  Pulse:  105 94 94  Temp:  100 F (37.8 C) 98.7 F (37.1 C) 98.8 F (37.1 C)  TempSrc:  Oral Oral Oral  Resp:  16 18 20   Height:      Weight:      SpO2: 92% 92% 97% 94%    Intake/Output Summary (Last 24 hours) at 12/12/13 1710 Last data filed at 12/12/13 0830  Gross per 24 hour  Intake    360 ml  Output      0 ml  Net    360 ml    Exam:   General:  Pt is alert, follows commands appropriately, not in acute distress  Cardiovascular: Regular rate and rhythm, S1/S2, no murmurs, no rubs, no gallops  Respiratory: Congested, no wheezing  Abdomen: Soft, non tender, non distended, bowel sounds present, no guarding  Extremities: No edema, pulses DP and PT palpable bilaterally  Neuro: Grossly nonfocal  Data Reviewed: Basic Metabolic Panel:  Recent Labs Lab 12/10/13 0440 12/11/13 0540 12/12/13 0842  NA 126* 133* 132*  K 3.9 4.0 3.3*  CL 89* 97 94*  CO2 26 24 25   GLUCOSE 123* 110* 120*  BUN 12 6 7   CREATININE 0.64 0.54 0.57  CALCIUM 9.0 8.5 8.4   Liver Function Tests:  Recent Labs Lab 12/10/13 0440 12/12/13 0842  AST 40* 66*  ALT 32 67*  ALKPHOS 60 82  BILITOT 3.5* 1.5*  PROT 6.8 6.6  ALBUMIN 3.2* 3.0*   No results found for this basename: LIPASE, AMYLASE,  in the last 168 hours No results found for  this basename: AMMONIA,  in the last 168 hours CBC:  Recent Labs Lab 12/10/13 0440 12/11/13 0540  WBC 27.0* 17.5*  NEUTROABS 24.0*  --   HGB 10.2* 9.6*  HCT 29.3* 28.0*  MCV 92.7 94.3  PLT 269 291   Cardiac Enzymes: No results found for this basename: CKTOTAL, CKMB, CKMBINDEX, TROPONINI,  in the last 168 hours BNP: No components found with this basename: POCBNP,  CBG: No results found for this basename: GLUCAP,  in the last 168 hours  No results found for this or any previous visit (from the past 240 hour(s)).   Studies: No results found.  Scheduled  Meds: . amLODipine  5 mg Oral Daily  . cefTRIAXone (ROCEPHIN)  IV  1 g Intravenous Q24H  . chlorpheniramine-HYDROcodone  5 mL Oral Q12H  . diphenhydrAMINE  25 mg Oral QHS  . docusate sodium  100 mg Oral BID  . doxycycline (VIBRAMYCIN) IV  100 mg Intravenous Q12H  . enoxaparin (LOVENOX) injection  40 mg Subcutaneous Q24H  . guaiFENesin  1,200 mg Oral BID  . metoprolol succinate  25 mg Oral Daily  . oseltamivir  75 mg Oral Daily   Continuous Infusions: . sodium chloride 75 mL/hr at 12/12/13 1012

## 2013-12-13 MED ORDER — PROCHLORPERAZINE MALEATE 10 MG PO TABS: 10.0000 mg | ORAL_TABLET | Freq: Four times a day (QID) | ORAL | Status: DC | PRN

## 2013-12-13 MED ORDER — DSS 100 MG PO CAPS: 100.0000 mg | ORAL_CAPSULE | Freq: Two times a day (BID) | ORAL | Status: DC

## 2013-12-13 MED ORDER — HYDROCOD POLST-CHLORPHEN POLST 10-8 MG/5ML PO LQCR: 5.0000 mL | Freq: Two times a day (BID) | ORAL | Status: DC

## 2013-12-13 MED ORDER — METOPROLOL SUCCINATE ER 25 MG PO TB24: 25.0000 mg | ORAL_TABLET | Freq: Every day | ORAL | Status: DC

## 2013-12-13 MED ORDER — NITROFURANTOIN MACROCRYSTAL 50 MG PO CAPS: 50.0000 mg | ORAL_CAPSULE | ORAL | Status: DC

## 2013-12-13 MED ORDER — LEVOFLOXACIN 750 MG PO TABS
750.0000 mg | ORAL_TABLET | Freq: Every day | ORAL | Status: DC
Start: 2013-12-13 — End: 2015-10-17

## 2013-12-13 MED ORDER — AMLODIPINE BESYLATE 5 MG PO TABS: 5.0000 mg | ORAL_TABLET | Freq: Every day | ORAL | Status: DC

## 2013-12-13 MED ORDER — GUAIFENESIN ER 600 MG PO TB12: 1200.0000 mg | ORAL_TABLET | Freq: Two times a day (BID) | ORAL | Status: DC | PRN

## 2013-12-13 NOTE — Discharge Summary (Signed)
Physician Discharge Summary  Brianna [name] ZHG:[id number] DOB: 1924/10/20 DOA: 12/10/2013  PCP: No primary provider on file.  Admit date: 12/10/2013 Discharge date: 12/13/2013  Recommendations for Outpatient Follow-up:  1. Take Levaquin for 5 more days on discharge for pneumonia 2. Influenza PCR on this admission is negative  Discharge Diagnoses:  Principal Problem:   Influenza A (H1N1) Active Problems:   Nausea vomiting and diarrhea   CAP (community acquired pneumonia)    Discharge Condition: medically stable for discharge home today   Diet recommendation: as tolerated   History of present illness:  80 y.o. female who presented to the [facility] ED 12/10/2013 with uncontrolled N/V/D. She was found to have positive influenza test was started on tamiflu. In addition, her CXR was significant for right middle lobe airspace opacity concerning for pneumonia. She was additionally started on doxycyline and rocephin. Her WBC count was elevated at 27 and hemoglobin was 1. Sodium was 126 on the admission. Further evaluation included CT chest which was significant for patchy airspace and ground-glass opacities in both lungs most consistent with multilobar pneumonia.There were also indeterminate low-density lesions within the pancreatic tail and left kidney of questionable significance.   Assessment/Plan:   Principal Problem:  Influenza A (H1N1)  - never got tamiflu because she vomited  - flu test negative on this admission  - continued supportive care with antiemetics  - diet as tolerated  Active Problems:  Nausea vomiting and diarrhea  - likely due to viral illness  - continued IV fluids, antiemetics in hospital  CAP (community acquired pneumonia)  - blood cultures not obtained at the time of the admission  - legionella and strep pneumonia negative  Leukocytosis  - due to viral and bacterial pneumonia  - management as above  Hyponatremia  - likely dehydration  - sodium improved   Code  Status: full code  Family Communication: daughter at the bedside   Consultants:  None  Procedures:  None  Antibiotics:  Rocephin 12/10/2013 -->  Doxycycline 12/10/2013 -->    Signed:  Leisa Lenz, MD  [facility] 12/13/2013, 8:14 AM  Pager #: [phone]    Discharge Exam: Filed Vitals:   12/13/13 0551  BP: 154/66  Pulse: 87  Temp: 98.2 F (36.8 C)  Resp: 18   Filed Vitals:   12/12/13 0610 12/12/13 1513 12/12/13 2104 12/13/13 0551  BP: 156/76 159/67 144/68 154/66  Pulse: 94 94 104 87  Temp: 98.7 F (37.1 C) 98.8 F (37.1 C) 100 F (37.8 C) 98.2 F (36.8 C)  TempSrc: Oral Oral Oral   Resp: 18 20 18 18   Height:      Weight:      SpO2: 97% 94% 98% 97%   Exam:  General: Pt is alert, follows commands appropriately, not in acute distress  Cardiovascular: Regular rate and rhythm, S1/S2, no murmurs, no rubs, no gallops  Respiratory: Congested, no wheezing  Abdomen: Soft, non tender, non distended, bowel sounds present, no guarding  Extremities: No edema, pulses DP and PT palpable bilaterally  Neuro: Grossly nonfocal   Discharge Instructions  Discharge Orders   Future Orders Complete By Expires   Call MD for:  difficulty breathing, headache or visual disturbances  As directed    Call MD for:  persistant dizziness or light-headedness  As directed    Call MD for:  persistant nausea and vomiting  As directed    Call MD for:  severe uncontrolled pain  As directed    Diet - low sodium heart  healthy  As directed    Discharge instructions  As directed    Comments:     1. Take Levaquin for 5 more days on discharge for pneumonia 2. Influenza PCR on this admission is negative   Increase activity slowly  As directed        Medication List    STOP taking these medications       ondansetron 4 MG disintegrating tablet  Commonly known as:  ZOFRAN-ODT      TAKE these medications       amLODipine 5 MG tablet  Commonly known as:  NORVASC  Take 1 tablet  (5 mg total) by mouth daily.     chlorpheniramine-HYDROcodone 10-8 MG/5ML Lqcr  Commonly known as:  TUSSIONEX  Take 5 mLs by mouth every 12 (twelve) hours.     DSS 100 MG Caps  Take 100 mg by mouth 2 (two) times daily.     guaiFENesin 600 MG 12 hr tablet  Commonly known as:  MUCINEX  Take 2 tablets (1,200 mg total) by mouth 2 (two) times daily as needed for to loosen phlegm.     levofloxacin 750 MG tablet  Commonly known as:  LEVAQUIN  Take 1 tablet (750 mg total) by mouth daily.     metoprolol succinate 25 MG 24 hr tablet  Commonly known as:  TOPROL-XL  Take 1 tablet (25 mg total) by mouth daily.     nitrofurantoin 50 MG capsule  Commonly known as:  MACRODANTIN  Take 1 capsule (50 mg total) by mouth 3 (three) times a week. Take on Mondays Wednesday and Fridays-Name brand only     prochlorperazine 10 MG tablet  Commonly known as:  COMPAZINE  Take 1 tablet (10 mg total) by mouth every 6 (six) hours as needed for nausea or vomiting.           Follow-up Information   Call [facility]    . (With Dr. Charlies Silvers - call to sch appt if needed)    Contact information:   [place] [place] [address] [phone]       The results of significant diagnostics from this hospitalization (including imaging, microbiology, ancillary and laboratory) are listed below for reference.    Significant Diagnostic Studies: Dg Chest 2 View  12/10/2013   CLINICAL DATA:  Flu symptoms.  EXAM: CHEST  2 VIEW  COMPARISON:  None available for comparison at time of study interpretation.  FINDINGS: The cardiac silhouette appears mildly enlarged, mediastinal silhouette is nonsuspicious ; mildly calcified aortic knob. Moderate chronic interstitial changes. 13 mm nodular density projects in right upper lobe. Patchy airspace opacity in right middle lobe. No pneumothorax.  Patient is osteopenic. Soft tissue planes are nonsuspicious. Mild symmetric calcifications in the  neck are likely vascular.  IMPRESSION: Right middle lobe airspace opacity may reflect pneumonia. Recommend followup chest radiograph after treatment to verify improvement.  Mild cardiomegaly and moderate chronic interstitial changes.  13 mm right upper lobe nodule would be better characterized on CT of the chest as clinically indicated ; if not performed, recommend close attention to this area on follow-up imaging.   Electronically Signed   By: Elon Alas   On: 12/10/2013 04:15   Ct Chest W Contrast  12/10/2013   CLINICAL DATA:  Nausea, vomiting and diarrhea for 2 days. Influenza. Right middle lobe airspace disease and pulmonary nodule on chest radiographs.  EXAM: CT CHEST WITH CONTRAST  TECHNIQUE: Multidetector CT imaging of  the chest was performed during intravenous contrast administration.  CONTRAST:  37mL OMNIPAQUE IOHEXOL 300 MG/ML  SOLN  COMPARISON:  Chest radiographs 12/10/2013.  FINDINGS: Small right hilar and subcarinal lymph nodes are likely reactive. No pathologically enlarged mediastinal, hilar or axillary lymph nodes are identified. There is mild atherosclerosis of the aorta, great vessels and coronary arteries.  Minimal pleural fluid is present on the right. There is no significant left pleural or pericardial effusion.  There are patchy airspace and ground-glass opacities in both lungs with areas of consolidation in the right upper, middle and lower lobes as well as the lingula. There is a 1.4 cm nodular density in the right middle lobe on image number 36. In the right lower lobe, there is a 6 mm nodular density on image 47. No endobronchial lesion is identified.  Images through the upper abdomen demonstrate no abnormality of the liver or adrenal glands. There are low density lesions within the pancreatic tail measuring 6 mm on image 5 of series 5 and 13 mm on image 2 of series 7. There is another smaller lesion within the pancreatic body on image number 4 of series 7. There is a 1.4 cm  low-density lesion in the interpolar region of the left kidney, measuring 23 HU on image 4 of series 9.  Mild thoracic spondylosis is noted. There are no worrisome osseous findings.  IMPRESSION: 1. There are patchy airspace and ground-glass opacities in both lungs most consistent with multilobar pneumonia. Radiographic follow up recommended. If these opacities fail to resolve, CT follow-up may be warranted. 2. Indeterminate low-density lesions within the pancreatic tail and left kidney. In this 80 year old, these findings are of questionable significance. Management options include in further characterization with abdominal MRI or CT follow up in 6 months (which could be performed in conjunction with follow-up chest CT) to assess stability.   Electronically Signed   By: Camie Patience M.D.   On: 12/10/2013 10:38    Microbiology: No results found for this or any previous visit (from the past 240 hour(s)).   Labs: Basic Metabolic Panel:  Recent Labs Lab 12/10/13 0440 12/11/13 0540 12/12/13 0842  NA 126* 133* 132*  K 3.9 4.0 3.3*  CL 89* 97 94*  CO2 26 24 25   GLUCOSE 123* 110* 120*  BUN 12 6 7   CREATININE 0.64 0.54 0.57  CALCIUM 9.0 8.5 8.4   Liver Function Tests:  Recent Labs Lab 12/10/13 0440 12/12/13 0842  AST 40* 66*  ALT 32 67*  ALKPHOS 60 82  BILITOT 3.5* 1.5*  PROT 6.8 6.6  ALBUMIN 3.2* 3.0*   No results found for this basename: LIPASE, AMYLASE,  in the last 168 hours No results found for this basename: AMMONIA,  in the last 168 hours CBC:  Recent Labs Lab 12/10/13 0440 12/11/13 0540  WBC 27.0* 17.5*  NEUTROABS 24.0*  --   HGB 10.2* 9.6*  HCT 29.3* 28.0*  MCV 92.7 94.3  PLT 269 291   Cardiac Enzymes: No results found for this basename: CKTOTAL, CKMB, CKMBINDEX, TROPONINI,  in the last 168 hours BNP: BNP (last 3 results) No results found for this basename: PROBNP,  in the last 8760 hours CBG: No results found for this basename: GLUCAP,  in the last 168  hours  Time coordinating discharge: Over 30 minutes

## 2013-12-13 NOTE — Discharge Instructions (Signed)

## 2013-12-14 LAB — LEGIONELLA ANTIGEN, URINE: Legionella Antigen, Urine: NEGATIVE

## 2014-01-02 ENCOUNTER — Ambulatory Visit: Payer: Medicare Other | Attending: Internal Medicine | Admitting: Internal Medicine

## 2014-01-02 ENCOUNTER — Encounter: Payer: Self-pay | Admitting: Internal Medicine

## 2014-01-02 VITALS — BP 118/75 | HR 99 | Temp 98.7°F | Resp 16 | Ht 66.0 in | Wt 129.0 lb

## 2014-01-02 DIAGNOSIS — J189 Pneumonia, unspecified organism: Secondary | ICD-10-CM

## 2014-01-02 LAB — COMPLETE METABOLIC PANEL WITH GFR
ALK PHOS: 64 U/L (ref 39–117)
ALT: 15 U/L (ref 0–35)
AST: 15 U/L (ref 0–37)
Albumin: 4 g/dL (ref 3.5–5.2)
BILIRUBIN TOTAL: 1.9 mg/dL — AB (ref 0.3–1.2)
BUN: 12 mg/dL (ref 6–23)
CO2: 29 meq/L (ref 19–32)
CREATININE: 0.57 mg/dL (ref 0.50–1.10)
Calcium: 9.6 mg/dL (ref 8.4–10.5)
Chloride: 98 mEq/L (ref 96–112)
GFR, EST NON AFRICAN AMERICAN: 84 mL/min
GFR, Est African American: >89 mL/min
GLUCOSE: 98 mg/dL (ref 70–99)
Potassium: 4.8 mEq/L (ref 3.5–5.3)
Sodium: 135 mEq/L (ref 135–145)
Total Protein: 6.9 g/dL (ref 6.0–8.3)

## 2014-01-02 NOTE — Progress Notes (Signed)
[facility] Pt is here following up on her bilateral pneumonia.

## 2014-01-02 NOTE — Progress Notes (Addendum)
Patient ID: Brianna [name], female   DOB: 02-08-1924, 80 y.o.   MRN: [id number]   CC:  HPI: 80 year old female recently admitted for community-acquired pneumonia, according to the daughter the patient had a positive flu test in [place] on her way to  [place]. However she tested negative [facility] and had only a couple of doses of Tamiflu. She completed her antibiotic course, has no residual symptoms She fainted a week ago. Daughter is a Marine scientist and checks her blood pressure and she had positive orthostatics. She has been holding her metoprolol and Norvasc. Blood pressures in the 474Q systolic  She intends to go back to [place] on Saturday   Allergies  Allergen Reactions  . Erythromycin     "passed out"   Past Medical History  Diagnosis Date  . Hypertension   . Pneumonia   . Bladder infection    Current Outpatient Prescriptions on File Prior to Visit  Medication Sig Dispense Refill  . nitrofurantoin (MACRODANTIN) 50 MG capsule Take 1 capsule (50 mg total) by mouth 3 (three) times a week. Take on Mondays Wednesday and Fridays-Name brand only  60 capsule  0  . amLODipine (NORVASC) 5 MG tablet Take 1 tablet (5 mg total) by mouth daily.  30 tablet  0  . chlorpheniramine-HYDROcodone (TUSSIONEX) 10-8 MG/5ML LQCR Take 5 mLs by mouth every 12 (twelve) hours.  1 Bottle  0  . docusate sodium 100 MG CAPS Take 100 mg by mouth 2 (two) times daily.  20 capsule  0  . guaiFENesin (MUCINEX) 600 MG 12 hr tablet Take 2 tablets (1,200 mg total) by mouth 2 (two) times daily as needed for to loosen phlegm.  60 tablet  0  . levofloxacin (LEVAQUIN) 750 MG tablet Take 1 tablet (750 mg total) by mouth daily.  5 tablet  0  . metoprolol succinate (TOPROL-XL) 25 MG 24 hr tablet Take 1 tablet (25 mg total) by mouth daily.  30 tablet  0  . prochlorperazine (COMPAZINE) 10 MG tablet Take 1 tablet (10 mg total) by mouth every 6 (six) hours as needed for nausea or vomiting.  30 tablet  0   No current  facility-administered medications on file prior to visit.   Family History  Problem Relation Age of Onset  . Heart disease Mother   . Heart disease Father   . Hypertension Sister   . Stroke Sister   . Cancer Sister    History   Social History  . Marital Status: Widowed    Spouse Name: N/A    Number of Children: N/A  . Years of Education: N/A   Occupational History  . Not on file.   Social History Main Topics  . Smoking status: Never Smoker   . Smokeless tobacco: Never Used  . Alcohol Use: Yes     Comment: 1 glass wine per day  . Drug Use: No  . Sexual Activity: Not on file   Other Topics Concern  . Not on file   Social History Narrative  . No narrative on file    Review of Systems  Constitutional: Negative for fever, chills, diaphoresis, activity change, appetite change and fatigue.  HENT: Negative for ear pain, nosebleeds, congestion, facial swelling, rhinorrhea, neck pain, neck stiffness and ear discharge.   Eyes: Negative for pain, discharge, redness, itching and visual disturbance.  Respiratory: Negative for cough, choking, chest tightness, shortness of breath, wheezing and stridor.   Cardiovascular: Negative for chest pain, palpitations and leg swelling.  Gastrointestinal: Negative for abdominal distention.  Genitourinary: Negative for dysuria, urgency, frequency, hematuria, flank pain, decreased urine volume, difficulty urinating and dyspareunia.  Musculoskeletal: Negative for back pain, joint swelling, arthralgias and gait problem.  Neurological: Negative for dizziness, tremors, seizures, syncope, facial asymmetry, speech difficulty, weakness, light-headedness, numbness and headaches.  Hematological: Negative for adenopathy. Does not bruise/bleed easily.  Psychiatric/Behavioral: Negative for hallucinations, behavioral problems, confusion, dysphoric mood, decreased concentration and agitation.    Objective:   Filed Vitals:   01/02/14 1205  BP: 155/76   Pulse: 86  Temp: 98.7 F (37.1 C)  Resp: 16    Physical Exam  Constitutional: Appears well-developed and well-nourished. No distress.  HENT: Normocephalic. External right and left ear normal. Oropharynx is clear and moist.  Eyes: Conjunctivae and EOM are normal. PERRLA, no scleral icterus.  Neck: Normal ROM. Neck supple. No JVD. No tracheal deviation. No thyromegaly.  CVS: RRR, S1/S2 +, no murmurs, no gallops, no carotid bruit.  Pulmonary: Effort and breath sounds normal, no stridor, rhonchi, wheezes, rales.  Abdominal: Soft. BS +,  no distension, tenderness, rebound or guarding.  Musculoskeletal: Normal range of motion. No edema and no tenderness.  Lymphadenopathy: No lymphadenopathy noted, cervical, inguinal. Neuro: Alert. Normal reflexes, muscle tone coordination. No cranial nerve deficit. Skin: Skin is warm and dry. No rash noted. Not diaphoretic. No erythema. No pallor.  Psychiatric: Normal mood and affect. Behavior, judgment, thought content normal.   Lab Results  Component Value Date   WBC 17.5* 12/11/2013   HGB 9.6* 12/11/2013   HCT 28.0* 12/11/2013   MCV 94.3 12/11/2013   PLT 291 12/11/2013   Lab Results  Component Value Date   CREATININE 0.57 12/12/2013   BUN 7 12/12/2013   NA 132* 12/12/2013   K 3.3* 12/12/2013   CL 94* 12/12/2013   CO2 25 12/12/2013    No results found for this basename: HGBA1C   Lipid Panel  No results found for this basename: chol, trig, hdl, cholhdl, vldl, ldlcalc       Assessment and plan:   Patient Active Problem List   Diagnosis Date Noted  . Influenza A (H1N1) 12/10/2013  . Nausea vomiting and diarrhea 12/10/2013  . CAP (community acquired pneumonia) 12/10/2013     Recent community-acquired pneumonia, positive influenza? Doing well, completed antibiotics No repeat chest x-ray indicated  Hypertension Patient had significant electrolyte abnormalities Therefore we'll check a CMP and CBC If her labs are stable today then  the patient can restart her metoprolol and her Norvasc Found to be orthostatic today. Patient advised to continue holding blood pressure medications and start wearing compression stockings    The patient was given clear instructions to go to ER or return to medical center if symptoms don't improve, worsen or new problems develop. The patient verbalized understanding. The patient was told to call to get any lab results if not heard anything in the next week.

## 2014-01-03 ENCOUNTER — Telehealth: Payer: Self-pay | Admitting: *Deleted

## 2014-01-03 LAB — CBC WITH DIFFERENTIAL/PLATELET
BASOS PCT: 1 % (ref 0–1)
Basophils Absolute: 0.1 10*3/uL (ref 0.0–0.1)
EOS ABS: 0.1 10*3/uL (ref 0.0–0.7)
Eosinophils Relative: 1 % (ref 0–5)
HCT: 27.3 % — ABNORMAL LOW (ref 36.0–46.0)
HEMOGLOBIN: 9.8 g/dL — AB (ref 12.0–15.0)
LYMPHS ABS: 3.3 10*3/uL (ref 0.7–4.0)
Lymphocytes Relative: 29 % (ref 12–46)
MCH: 32.1 pg (ref 26.0–34.0)
MCHC: 35.9 g/dL (ref 30.0–36.0)
MCV: 89.5 fL (ref 78.0–100.0)
MONOS PCT: 6 % (ref 3–12)
Monocytes Absolute: 0.7 10*3/uL (ref 0.1–1.0)
Neutro Abs: 7.1 10*3/uL (ref 1.7–7.7)
Neutrophils Relative %: 63 % (ref 43–77)
Platelets: 591 10*3/uL — ABNORMAL HIGH (ref 150–400)
RBC: 3.05 MIL/uL — AB (ref 3.87–5.11)
RDW: 17.3 % — ABNORMAL HIGH (ref 11.5–15.5)
WBC: 11.3 10*3/uL — ABNORMAL HIGH (ref 4.0–10.5)

## 2014-01-03 NOTE — Telephone Encounter (Signed)
Message copied by Nianna Igo, [place] R on Thu Jan 03, 2014 10:44 AM ------      Message from: Allyson Sabal MD, Ascencion Dike      Created: Thu Jan 03, 2014 10:27 AM       Notify patient of the labs are improving, white count was down to 11.3 hemoglobin 9.8 kidney function is stable, ------

## 2015-10-17 ENCOUNTER — Ambulatory Visit (INDEPENDENT_AMBULATORY_CARE_PROVIDER_SITE_OTHER): Payer: Medicare Other

## 2015-10-17 ENCOUNTER — Ambulatory Visit (INDEPENDENT_AMBULATORY_CARE_PROVIDER_SITE_OTHER): Payer: Medicare Other | Admitting: Physician Assistant

## 2015-10-17 VITALS — BP 152/82 | HR 102 | Temp 98.3°F | Resp 16 | Ht 65.0 in | Wt 122.0 lb

## 2015-10-17 DIAGNOSIS — M25552 Pain in left hip: Secondary | ICD-10-CM

## 2015-10-17 DIAGNOSIS — S22009A Unspecified fracture of unspecified thoracic vertebra, initial encounter for closed fracture: Secondary | ICD-10-CM | POA: Insufficient documentation

## 2015-10-17 DIAGNOSIS — M810 Age-related osteoporosis without current pathological fracture: Secondary | ICD-10-CM

## 2015-10-17 DIAGNOSIS — W19XXXA Unspecified fall, initial encounter: Secondary | ICD-10-CM | POA: Diagnosis not present

## 2015-10-17 DIAGNOSIS — M51369 Other intervertebral disc degeneration, lumbar region without mention of lumbar back pain or lower extremity pain: Secondary | ICD-10-CM | POA: Insufficient documentation

## 2015-10-17 DIAGNOSIS — M5136 Other intervertebral disc degeneration, lumbar region: Secondary | ICD-10-CM | POA: Insufficient documentation

## 2015-10-17 DIAGNOSIS — M48061 Spinal stenosis, lumbar region without neurogenic claudication: Secondary | ICD-10-CM | POA: Insufficient documentation

## 2015-10-17 DIAGNOSIS — I10 Essential (primary) hypertension: Secondary | ICD-10-CM | POA: Insufficient documentation

## 2015-10-17 NOTE — Progress Notes (Signed)
   Brianna [name]  MRN: [id number] DOB: 19-Apr-1924  Subjective:  Pt presents to clinic for evaluation of left hip pain. She fell off her bed (she slipped) and landed on her bottom and her left wrist.  The next day she was seen by an orthopedist but her groin was not hurting at that time.  She has been wearing a wrist brace for his wrist and it is getting some better.  She thought she would give it time and take some motrin but it has really not gotten better over the last 2 weeks and she thinks that the pain is worse with standing now than it was.    Patient Active Problem List   Diagnosis Date Noted  . Osteoporosis 10/17/2015  . Spinal stenosis of lumbar region 10/17/2015  . DDD (degenerative disc disease), lumbar 10/17/2015  . Fracture, thoracic vertebra ([place]) 10/17/2015  . Benign essential HTN 10/17/2015    Current Outpatient Prescriptions on File Prior to Visit  Medication Sig Dispense Refill  . metoprolol succinate (TOPROL-XL) 25 MG 24 hr tablet Take 1 tablet (25 mg total) by mouth daily. 30 tablet 0   No current facility-administered medications on file prior to visit.    Allergies  Allergen Reactions  . Erythromycin     "passed out"    Review of Systems Objective:  BP 152/82 mmHg  Pulse 102  Temp(Src) 98.3 F (36.8 C) (Oral)  Resp 16  Ht 5\' 5"  (1.651 m)  Wt 122 lb (55.339 kg)  BMI 20.30 kg/m2  SpO2 96%  Physical Exam  Constitutional: She is oriented to person, place, and time and well-developed, well-nourished, and in no distress.  HENT:  Head: Normocephalic and atraumatic.  Right Ear: Hearing and external ear normal.  Left Ear: Hearing and external ear normal.  Eyes: Conjunctivae are normal.  Neck: Normal range of motion.  Pulmonary/Chest: Effort normal.  Musculoskeletal:       Legs: Legs equal lengths. No pain in sitting position with hip flexion but pain with abduction and adduction.  In the laying position patient has pain with hip flexion.  She is  tender to touch at the marked area.  Neurological: She is alert and oriented to person, place, and time. Gait normal.  Skin: Skin is warm and dry.  Psychiatric: Mood, memory, affect and judgment normal.  Vitals reviewed.   [facility] reading (PRIMARY) by  Dr. Brigitte Pulse.  Distal aspect of trochanter irregular? Fracture - xray reports showed no fracture but significant arthritis in both joints.  Assessment and Plan :  Pain, hip, left - Plan: DG HIP UNILAT W OR W/O PELVIS 2-3 VIEWS LEFT, Ambulatory referral to Orthopedic Surgery  Osteoporosis  Fall, initial encounter - Plan: Ambulatory referral to Orthopedic Surgery  No fracture on xray but I am concerned about the fact that her pain is worsening.  ? Need for [place] vs MRI - due to age and history of OA - encouraged her to use a walker for support and stability but with her wrist pain I know this is going to be difficult suggested decrease in her activity level until she can be further evaluated.  D/w Dr Jonna Clark PA-C  [facility] 10/17/2015 6:23 PM

## 2015-10-17 NOTE — Patient Instructions (Addendum)
Motrin with food  An appointment with ortho early next week with [facility]

## 2015-10-21 ENCOUNTER — Telehealth: Payer: Self-pay

## 2015-10-21 NOTE — Telephone Encounter (Signed)
Done, In pick up drawer, with ROI to be completed.

## 2015-10-21 NOTE — Telephone Encounter (Addendum)
Pt has possible fx hip and has an appt with [facility] at 3:30,needs copies of same visit and x-rays to take with her Daughter will pick up!   Best phone for pt is [phone]   Pt s daughter will pick up at 2 pm

## 2015-10-22 ENCOUNTER — Other Ambulatory Visit: Payer: Self-pay | Admitting: Orthopedic Surgery

## 2015-10-22 ENCOUNTER — Ambulatory Visit
Admission: RE | Admit: 2015-10-22 | Discharge: 2015-10-22 | Disposition: A | Discharge status: Home / Self Care | Payer: Medicare Other | Source: Ambulatory Visit | Attending: Orthopedic Surgery | Admitting: Orthopedic Surgery

## 2015-10-22 DIAGNOSIS — M25552 Pain in left hip: Secondary | ICD-10-CM

## 2015-10-30 ENCOUNTER — Emergency Department (HOSPITAL_COMMUNITY)
Admission: EM | Admit: 2015-10-30 | Discharge: 2015-10-30 | Disposition: A | Discharge status: Home / Self Care | Payer: Medicare Other | Attending: Emergency Medicine | Admitting: Emergency Medicine

## 2015-10-30 ENCOUNTER — Encounter (HOSPITAL_COMMUNITY): Payer: Self-pay | Admitting: *Deleted

## 2015-10-30 DIAGNOSIS — I10 Essential (primary) hypertension: Secondary | ICD-10-CM | POA: Insufficient documentation

## 2015-10-30 DIAGNOSIS — Z8701 Personal history of pneumonia (recurrent): Secondary | ICD-10-CM | POA: Insufficient documentation

## 2015-10-30 DIAGNOSIS — Z8781 Personal history of (healed) traumatic fracture: Secondary | ICD-10-CM | POA: Diagnosis not present

## 2015-10-30 DIAGNOSIS — Z87448 Personal history of other diseases of urinary system: Secondary | ICD-10-CM | POA: Insufficient documentation

## 2015-10-30 DIAGNOSIS — R112 Nausea with vomiting, unspecified: Secondary | ICD-10-CM | POA: Diagnosis not present

## 2015-10-30 DIAGNOSIS — Z79899 Other long term (current) drug therapy: Secondary | ICD-10-CM | POA: Diagnosis not present

## 2015-10-30 DIAGNOSIS — M545 Low back pain, unspecified: Secondary | ICD-10-CM

## 2015-10-30 HISTORY — DX: Unspecified fracture of unspecified thoracic vertebra, initial encounter for closed fracture: S22.009A

## 2015-10-30 LAB — COMPREHENSIVE METABOLIC PANEL
ALBUMIN: 4.2 g/dL (ref 3.5–5.0)
ALT: 17 U/L (ref 14–54)
AST: 22 U/L (ref 15–41)
Alkaline Phosphatase: 103 U/L (ref 38–126)
Anion gap: 7 (ref 5–15)
BUN: 16 mg/dL (ref 6–20)
CHLORIDE: 97 mmol/L — AB (ref 101–111)
CO2: 27 mmol/L (ref 22–32)
Calcium: 9 mg/dL (ref 8.9–10.3)
Creatinine, Ser: 0.47 mg/dL (ref 0.44–1.00)
GFR calc Af Amer: >60 mL/min (ref >60)
GFR calc non Af Amer: >60 mL/min (ref >60)
GLUCOSE: 127 mg/dL — AB (ref 65–99)
POTASSIUM: 5.2 mmol/L — AB (ref 3.5–5.1)
SODIUM: 131 mmol/L — AB (ref 135–145)
Total Bilirubin: 1.9 mg/dL — ABNORMAL HIGH (ref 0.3–1.2)
Total Protein: 7.6 g/dL (ref 6.5–8.1)

## 2015-10-30 LAB — CBC WITH DIFFERENTIAL/PLATELET
BASOS ABS: 0 10*3/uL (ref 0.0–0.1)
BASOS PCT: 0 %
EOS ABS: 0 10*3/uL (ref 0.0–0.7)
EOS PCT: 0 %
HCT: 30.3 % — ABNORMAL LOW (ref 36.0–46.0)
Hemoglobin: 10.3 g/dL — ABNORMAL LOW (ref 12.0–15.0)
Lymphocytes Relative: 6 %
Lymphs Abs: 0.9 10*3/uL (ref 0.7–4.0)
MCH: 32.5 pg (ref 26.0–34.0)
MCHC: 34 g/dL (ref 30.0–36.0)
MCV: 95.6 fL (ref 78.0–100.0)
MONO ABS: 0.6 10*3/uL (ref 0.1–1.0)
Monocytes Relative: 4 %
NEUTROS ABS: 12.5 10*3/uL — AB (ref 1.7–7.7)
Neutrophils Relative %: 90 %
PLATELETS: 388 10*3/uL (ref 150–400)
RBC: 3.17 MIL/uL — ABNORMAL LOW (ref 3.87–5.11)
RDW: 13.4 % (ref 11.5–15.5)
WBC: 14 10*3/uL — ABNORMAL HIGH (ref 4.0–10.5)

## 2015-10-30 MED ORDER — SODIUM CHLORIDE 0.9 % IV BOLUS (SEPSIS)
1000.0000 mL | Freq: Once | INTRAVENOUS | Status: AC
  Administered 2015-10-30: 1000 mL via INTRAVENOUS

## 2015-10-30 MED ORDER — ONDANSETRON 4 MG PO TBDP: ORAL_TABLET | ORAL | Status: DC

## 2015-10-30 MED ORDER — DIAZEPAM 5 MG PO TABS: 2.5000 mg | ORAL_TABLET | Freq: Three times a day (TID) | ORAL | Status: DC | PRN

## 2015-10-30 MED ORDER — ONDANSETRON HCL 4 MG/2ML IJ SOLN: 4.0000 mg | Freq: Once | INTRAMUSCULAR | Status: DC

## 2015-10-30 MED ORDER — DIAZEPAM 5 MG PO TABS: 5.0000 mg | ORAL_TABLET | Freq: Once | ORAL | Status: DC

## 2015-10-30 MED ORDER — ONDANSETRON HCL 4 MG PO TABS
4.0000 mg | ORAL_TABLET | Freq: Once | ORAL | Status: AC
  Administered 2015-10-30: 4 mg via ORAL
  Filled 2015-10-30: qty 1

## 2015-10-30 MED ORDER — DIAZEPAM 5 MG/ML IJ SOLN
2.5000 mg | Freq: Once | INTRAMUSCULAR | Status: AC
  Administered 2015-10-30: 2.5 mg via INTRAVENOUS
  Filled 2015-10-30: qty 2

## 2015-10-30 NOTE — ED Notes (Signed)
Pt alert and oriented x4. Respirations even and unlabored, bilateral symmetrical rise and fall of chest. Skin warm and dry. In no acute distress. Denies needs.   

## 2015-10-30 NOTE — ED Notes (Addendum)
Per ems pt is from home, pt reports lower back pain, 3  weeks ago pt slid off the bed, 4 days ago pt went to get a CT scan and when pt moving the back pain increased and has stayed increased. Pain 10/10 at present.   Upon rn assessment pt denies incontinence. Reports foot burning pain x3 days due to her back problems. Was prescribed tramadol yesterday, took a dose at 1830 and 0030, pt felt nauseas, today pt has vomited 5x.

## 2015-10-30 NOTE — Discharge Instructions (Signed)
°Emergency Department Resource Guide °1) Find a Doctor and Pay Out of Pocket °Although you won't have to find out who is covered by your insurance plan, it is a good idea to ask around and get recommendations. You will then need to call the office and see if the doctor you have chosen will accept you as a new patient and what types of options they offer for patients who are self-pay. Some doctors offer discounts or will set up payment plans for their patients who do not have insurance, but you will need to ask so you aren't surprised when you get to your appointment. ° °2) Contact Your Local Health Department °Not all health departments have doctors that can see patients for sick visits, but many do, so it is worth a call to see if yours does. If you don't know where your local health department is, you can check in your phone book. The CDC also has a tool to help you locate your state's health department, and many state websites also have listings of all of their local health departments. ° °3) Find a Walk-in Clinic °If your illness is not likely to be very severe or complicated, you may want to try a walk in clinic. These are popping up all over the country in pharmacies, drugstores, and shopping centers. They're usually staffed by nurse practitioners or physician assistants that have been trained to treat common illnesses and complaints. They're usually fairly quick and inexpensive. However, if you have serious medical issues or chronic medical problems, these are probably not your best option. ° °No Primary Care Doctor: °- Call [facility] at  832-8000 - they can help you locate a primary care doctor that  accepts your insurance, provides certain services, etc. °- Physician Referral Service- 1-800-533-3463 ° °Chronic Pain Problems: °Organization         Address  Phone   Notes  °[facility]  (336) 297-2271 Patients need to be referred by their primary care doctor.  ° °Medication  Assistance: °Organization         Address  Phone   Notes  °[place] [facility] [address] °[place], [place] [address] (336) 641-8030 --Must be a resident of [place] °-- Must have NO insurance coverage whatsoever (no Medicaid/ Medicare, etc.) °-- The pt. MUST have a primary care doctor that directs their care regularly and follows them in the community °  °[facility]  (866) 331-1348   °[employer]  (888) 892-1162   ° °Agencies that provide inexpensive medical care: °Organization         Address  Phone   Notes  °[facility]  (336) 832-8035   °[facility]    (336) 832-7272   °[facility] [address] °[place], [place] [address] (336) 832-4777   °[facility] [address], °[place] (336) 271-4999   °[facility]    (336) 373-0678   °[facility]    (336) 272-1050   °[facility] ° [address], [place] Phone:  (336) 832-4444, Fax:  (336) 832-4440 Hours of Operation:  9 am - 6 pm, M-F.  Also accepts Medicaid/Medicare and self-pay.  °[facility] ° [address], [place] Phone: (336) 832-3150, Fax: (336) 832-3151. Hours of Operation:  8:30 am - 5:30 pm, M-F.  Also accepts Medicaid and self-pay.  °[facility] [place] [address], [place] Phone: (336) 878-6027   °[facility] [address], [place], [place] (336)723-1848, Ext. 123 Mondays & Thursdays: 7-9 AM.  First 15 patients are seen on a first come, first serve basis. °  ° °Medicaid-accepting [place] Providers: ° °Organization         Address  Phone   Notes  °[facility] [address], [place] (336) 641-2100 Also accepts self-pay patients.  °[facility] [address], [place] ° (336) 856-9996   °[facility] [address], [place]  (336) 288-8857   °[facility] [address] [place] [address], [place] (336) 299-7000   °Veita Bland [address], [place]  ° (336) 373-1557 Only accepts [place] Access Medicaid patients after they have their name applied to their card.  ° °Self-Pay (no insurance) in [place]: ° °Organization         Address  Phone   Notes  °Sickle Cell Patients, [facility] [address], [place] (336) 832-1970   °[facility] [address], [place] (336) 832-4400   °[facility] ° [address], [place] (336) 992-4800   °[facility]/Dr. Osei-Bonsu ° [address] [place] [address], [place] or [address], [place] (336) 841-8500 Phone number for both [place] and [place] locations is the same.  °[facility] [address], [place] (336) 299-0000   °[facility] [address] [place] [address], [place] or [address] (336) 852-7530 °(336) 878-2260   °[facility] [address], [place] (336) 350-1642, phone; (336) 294-5005, fax Sees patients 1st and 3rd Saturday of every month.  Must not qualify for public or private insurance (i.e. Medicaid, Medicare, [place] Health Choice, Veterans' Benefits) • Household income should be no more than 200% of the poverty level •The clinic cannot treat you if you are pregnant or think you are pregnant • Sexually transmitted diseases are not treated at the clinic.  ° ° °Dental Care: °Organization         Address  Phone  Notes  °[place] [facility] [address], [place] (336) 641-6152 Accepts children up to age 21 who are enrolled in Medicaid or [place] Health Choice; pregnant women with a Medicaid card; and children who have applied for Medicaid or [place] Health Choice, but were declined, whose parents can pay a reduced fee at time of service.  °[place]  [facility] [place]  [address], [place] (336) 641-7733 Accepts children up to age 21 who are enrolled in Medicaid or [place] Health Choice; pregnant women with a Medicaid card; and children who have applied for Medicaid or [place] Health Choice, but were declined, whose parents can pay a reduced fee at time of service.  °[facility] ° [address],  (336) 641-4533 Patients are seen by appointment only. Walk-ins are not accepted. [facility] will see patients 18 years of age and older. °Monday - Tuesday (8am-5pm) °Most Wednesdays (8:30-5pm) °$30 per visit, cash only  °[facility] ° [address], [place] (336) 641-4533 Patients are seen by appointment only. Walk-ins are not accepted. [facility] will see patients 18 years of age and older. °One   Wednesday Evening (Monthly: Volunteer Based).  $30 per visit, cash only  °[facility]  (919) 537-3737 for adults; Children under age 4, call Graduate Pediatric Dentistry at (919) [phone]. Children aged 4-14, please call (919) [phone] to request a pediatric application. ° Dental services are provided in all areas of dental care including fillings, crowns and bridges, complete and partial dentures, implants, gum treatment, root canals, and extractions. Preventive care is also provided. Treatment is provided to both adults and children. °Patients are selected via a lottery and there is often a waiting list. °  °[facility] [address], °[place] ° (336) 763-8833 [email or web address] °  °[facility] [address], [place], [place] (336)723-1848, Ext. 123 Second and Fourth Thursday of each month, opens at 6:30 AM; Clinic ends at 9 AM.  Patients are seen on a first-come first-served basis, and a limited number are seen during each clinic.  ° °[facility] ° [address], [place], [place] (336) 723-7904    Eligibility Requirements °You must have lived in [place], [place], or [place] counties for at least the last three months. °  You cannot be eligible for state or federal sponsored healthcare insurance, including Veterans Administration, Medicaid, or Medicare. °  You generally cannot be eligible for healthcare insurance through your employer.  °  How to apply: °Eligibility screenings are held every Tuesday and Wednesday afternoon from 1:00 pm until 4:00 pm. You do not need an appointment for the interview!  °[facility] [address], [place], [place] 336-631-2330   °[facility] [facility]  336-342-8273   °[place]  336-703-3100   °[facility]  336-570-6415   ° °Behavioral Health Resources in the Community: °Intensive Outpatient Programs °Organization         Address  Phone  Notes  °[facility] [address], [place], [place] 336-878-6098   °[facility] [address], [place], [place] 336-832-9800   °[facility] [address], [place], [place] ° 336-882-2125   °[facility] [address],  °[place], [place] 1-800-853-5163 or 336-641-4981   °Substance Abuse Resources °Organization         Address  Phone  Notes  °[facility]  336-882-2125   °[facility]  336-784-9470   °[facility]  336-285-9073   °[facility]  336-845-3988   °Residential & Outpatient Substance Abuse Program  1-800-659-3381   °Psychological Services °Organization         Address  Phone  Notes  °[facility]  336- 832-9600   °[facility]  336- 378-7881   °[facility] [address], [place] 1-800-853-5163 or 336-641-4981   ° °Mobile Crisis Teams °Organization         Address  Phone  Notes  °[facility], Mobile Crisis Care Unit  1-877-626-1772   °Assertive °[facility] ° [address]  [place], [place] 336-834-9664   °Sharon DeEsch [address] °[place] 336-554-5454   ° °Self-Help/Support Groups °Organization         Address  Phone             Notes  °[facility] [place] - variety of support groups  336- 373-1402 Call for more information  °Narcotics Anonymous (NA), [facility] [address], °[place]   2 meetings at this location  ° °  Residential Treatment Programs °Organization         Address  Phone  Notes  °[facility] [address],    °[place]  1-866-801-8205   °[facility] ° [address], [place], [place] 704-293-8524   °[facility] [address], [place] 336-845-3988 Admissions: 8am-3pm M-F  °[facility] [address]    °[place], [place] 336-841-1104   °[facility] [address], [place], [place] 336-379-7146   °[facility] [address]  °[place], [place] 336-285-9073   °[facility] Intensive Outpatient [address], [place], [place] 336-852-3033   °[facility] [address]  °[place], [place] 1-877-615-2722 or 336-784-9470   °[facility] [address] [place], [place] 336-227-7417 Accepts Medicaid  °[facility] [address]  ° [place] 1-800-659-3381 Substance Abuse/Addiction Treatment  ° °[place] Behavioral Health Resources °Organization         Address  Phone  Notes  °[facility]  (888) 581-9988   °Julie Brannon, PhD [address] [place], [place]   (336) 349-5553 or (336) 951-0000   °[facility]   [address] °[place], [place] (336) 349-4454   °[facility] [address], [place], [place] (336) 342-8316 Insurance/Medicaid/sponsorship through [facility]  °[facility] [address]                                    [place], [place] (336) 342-8316 Therapy/tele-psych/case    °[facility] [address]  ° [place], [place] (336) 349-2233    °Dr. Arfeen  (336) 349-4544   °[facility] [place]  [employer] [place] [facility] 1) [address],  °2) [address], [place] °3)  [address], [place] (336) 349-3220 °(336) 342-7768 ° °(336) 342-8140   °[place] Child Abuse [facility] (336) 342-1394 or (336) 342-3537 (After Hours)    ° ° °

## 2015-10-30 NOTE — Progress Notes (Addendum)
   10/30/15 0000  CM Assessment  Expected Discharge [place]  In-house Referral NA  Discharge Planning Services CM Consult  [facility] Choice NA  Choice offered to / list presented to  Patient  [place]  Status of Service Completed, signed off  Discharge [place] Diaz    81 yr old female medicare pt from [place] about 6 weeks ago Daughter Rodena Piety has been in [employer] for about 20 years Pt lives across street from [place] On 10/30/15 Rodena Piety wants pt at her home to stay temporarily Rodena Piety wants all calls to her cell phone  Pt states this is her first experience with home health and [facility] Pt states she has walker, canes, crutches and bedside commode from a back surgery hospital stay in [place] states she prefers Dr Aleene Davidson to be Dr to get any further HHPT orders from at this time  Pt is scheduled to be seen by Kathyrn Lass to establish pcp services in 11/2015  EPIC updated for pcp and specialist  Pt has also been [facility] at [facility] by PA there since coming to [employer] see EPIC chart review notes   1554 Cm called Kristen of [facility] to provide HHPT referral   ED CNA states pt ws up to bedside commode only with him 1-2 steps taken CM reviewed in details medicare guidelines, home health [facility]) (length of stay in home, types of [facility] staff available, coverage, primary caregiver, up to 24 hrs before services may be started) and Private duty nursing (PDN-coverage, length of stay in the home types of staff available). CM reviewed availability of [place] [facility] to assist pcp to get pt to snf (if desired disposition) from the community level. CM provided pt/family with a list of [place] home health agencies and PDN.  Cm left CM business card for further questions/concerns

## 2015-10-30 NOTE — ED Provider Notes (Signed)
CSN: [id number]     Arrival date & time 10/30/15  1107 History   First MD Initiated Contact with Patient 10/30/15 1115     Chief Complaint  Patient presents with  . Back Pain  . Emesis     (Consider location/radiation/quality/duration/timing/severity/associated sxs/prior Treatment) Patient is a 81 y.o. female presenting with back pain.  Back Pain Pain location: left lumbar area. Quality:  Aching and cramping Radiates to:  Does not radiate Pain severity:  Mild Duration:  3 days Timing:  Constant Chronicity:  New Relieved by: ultram, but it makes her vomit. Worsened by:  Nothing tried Ineffective treatments:  None tried Associated symptoms: no abdominal pain     Past Medical History  Diagnosis Date  . Hypertension   . Pneumonia   . Bladder infection   . Thoracic vertebral fracture ([place])     T 11, unsure type of fracture   Past Surgical History  Procedure Laterality Date  . Joint replacement    . Appendectomy     Family History  Problem Relation Age of Onset  . Heart disease Mother   . Heart disease Father   . Hypertension Sister   . Stroke Sister   . Cancer Sister    Social History  Substance Use Topics  . Smoking status: Never Smoker   . Smokeless tobacco: Never Used  . Alcohol Use: Yes     Comment: 1 glass wine per day   OB History    No data available     Review of Systems  Gastrointestinal: Positive for nausea and vomiting. Negative for abdominal pain.  Musculoskeletal: Positive for back pain. Negative for neck pain.  All other systems reviewed and are negative.     Allergies  Compazine; Erythromycin; Lyrica; Promethazine; and Tramadol  Home Medications   Prior to Admission medications   Medication Sig Start Date End Date Taking? Authorizing Provider  ALPRAZolam Duanne Moron) 0.5 MG tablet Take 0.25-0.5 mg by mouth at bedtime as needed for anxiety.   Yes Historical Provider, MD  diphenhydrAMINE (SOMINEX) 25 MG tablet Take 25 mg by mouth at  bedtime as needed for sleep.   Yes Historical Provider, MD  Estrogens, Conjugated (PREMARIN VA) Place 1 application vaginally 3 (three) times a week.   Yes Historical Provider, MD  Melatonin 5 MG TABS Take 5 mg by mouth at bedtime as needed (sleep).   Yes Historical Provider, MD  metoprolol succinate (TOPROL-XL) 25 MG 24 hr tablet Take 1 tablet (25 mg total) by mouth daily. 12/13/13  Yes Robbie Lis, MD  Multiple Vitamin (MULTIVITAMIN WITH MINERALS) TABS tablet Take 1 tablet by mouth daily.   Yes Historical Provider, MD  Polyethyl Glycol-Propyl Glycol (SYSTANE OP) Apply 2 drops to eye 3 (three) times daily as needed (dry eyes).   Yes Historical Provider, MD  diazepam (VALIUM) 5 MG tablet Take 0.5-1 tablets (2.5-5 mg total) by mouth every 8 (eight) hours as needed for muscle spasms. 10/30/15   [name], MD  ondansetron (ZOFRAN ODT) 4 MG disintegrating tablet 4mg  ODT q4 hours prn nausea/vomit 10/30/15   [name], MD   BP 121/75 mmHg  Pulse 93  Temp(Src) 98 F (36.7 C) (Oral)  Resp 16  SpO2 98% Physical Exam  Constitutional: She is oriented to person, place, and time. She appears well-developed and well-nourished.  HENT:  Head: Normocephalic and atraumatic.  Neck: Normal range of motion.  Cardiovascular: Normal rate and regular rhythm.   Pulmonary/Chest: Effort normal and breath sounds normal. No  stridor. No respiratory distress.  Abdominal: Soft. She exhibits no distension.  Musculoskeletal: Normal range of motion. She exhibits tenderness (to right lower back, worse with movement of leg, no new neurology findings).  Neurological: She is alert and oriented to person, place, and time.  Lower extremities with normal strength and sensation and DTR's./   Skin: Skin is warm and dry. No rash noted. No erythema.  Nursing note and vitals reviewed.   ED Course  Procedures (including critical care time) Labs Review Labs Reviewed  CBC WITH DIFFERENTIAL/PLATELET - Abnormal; Notable for the  following:    WBC 14.0 (*)    RBC 3.17 (*)    Hemoglobin 10.3 (*)    HCT 30.3 (*)    Neutro Abs 12.5 (*)    All other components within normal limits  COMPREHENSIVE METABOLIC PANEL - Abnormal; Notable for the following:    Sodium 131 (*)    Potassium 5.2 (*)    Chloride 97 (*)    Glucose, Bld 127 (*)    Total Bilirubin 1.9 (*)    All other components within normal limits    Imaging Review No results found. I have personally reviewed and evaluated these images and lab results as part of my medical decision-making.   EKG Interpretation None      MDM   Final diagnoses:  Right-sided low back pain without sciatica   Likely msk strain, improved with valium. H/o broken bones and this is not similar. Worse with anything that causes contraction of right paraspinal muscles. Able to ambulate. Symptoms improved piror to dc. Vomiting was after tramadol for which she has a history of the same so doubt intraabdominal pathology. No new neurologic changes. Will get PT in home to try and help out.      [name], MD 11/01/15 1101

## 2015-10-30 NOTE — ED Notes (Signed)
md at bedside

## 2015-11-03 ENCOUNTER — Telehealth: Payer: Self-pay | Admitting: *Deleted

## 2015-11-18 ENCOUNTER — Other Ambulatory Visit: Payer: Self-pay | Admitting: Orthopedic Surgery

## 2015-11-18 DIAGNOSIS — M25551 Pain in right hip: Secondary | ICD-10-CM

## 2015-11-18 DIAGNOSIS — M25552 Pain in left hip: Principal | ICD-10-CM

## 2015-11-25 ENCOUNTER — Other Ambulatory Visit: Payer: Medicare Other

## 2015-12-02 ENCOUNTER — Other Ambulatory Visit: Payer: Medicare Other

## 2015-12-18 DIAGNOSIS — S22088G Other fracture of T11-T12 vertebra, subsequent encounter for fracture with delayed healing: Secondary | ICD-10-CM

## 2016-06-16 ENCOUNTER — Emergency Department (HOSPITAL_COMMUNITY): Payer: Medicare Other

## 2016-06-16 ENCOUNTER — Encounter (HOSPITAL_COMMUNITY): Payer: Self-pay

## 2016-06-16 ENCOUNTER — Emergency Department (HOSPITAL_COMMUNITY)
Admission: EM | Admit: 2016-06-16 | Discharge: 2016-06-16 | Disposition: A | Discharge status: Home / Self Care | Payer: Medicare Other | Source: Home / Self Care | Attending: Emergency Medicine | Admitting: Emergency Medicine

## 2016-06-16 DIAGNOSIS — Z79899 Other long term (current) drug therapy: Secondary | ICD-10-CM

## 2016-06-16 DIAGNOSIS — R509 Fever, unspecified: Secondary | ICD-10-CM | POA: Diagnosis not present

## 2016-06-16 DIAGNOSIS — I10 Essential (primary) hypertension: Secondary | ICD-10-CM

## 2016-06-16 DIAGNOSIS — R55 Syncope and collapse: Secondary | ICD-10-CM | POA: Insufficient documentation

## 2016-06-16 DIAGNOSIS — A419 Sepsis, unspecified organism: Secondary | ICD-10-CM | POA: Diagnosis not present

## 2016-06-16 LAB — CBC
HCT: 28.8 % — ABNORMAL LOW (ref 36.0–46.0)
Hemoglobin: 9.6 g/dL — ABNORMAL LOW (ref 12.0–15.0)
MCH: 32.1 pg (ref 26.0–34.0)
MCHC: 33.3 g/dL (ref 30.0–36.0)
MCV: 96.3 fL (ref 78.0–100.0)
PLATELETS: 379 10*3/uL (ref 150–400)
RBC: 2.99 MIL/uL — AB (ref 3.87–5.11)
RDW: 13.2 % (ref 11.5–15.5)
WBC: 19.4 10*3/uL — ABNORMAL HIGH (ref 4.0–10.5)

## 2016-06-16 LAB — URINALYSIS, ROUTINE W REFLEX MICROSCOPIC
BILIRUBIN URINE: NEGATIVE
GLUCOSE, UA: NEGATIVE mg/dL
HGB URINE DIPSTICK: NEGATIVE
Ketones, ur: NEGATIVE mg/dL
Nitrite: NEGATIVE
Protein, ur: NEGATIVE mg/dL
SPECIFIC GRAVITY, URINE: 1.014 (ref 1.005–1.030)
pH: 7 (ref 5.0–8.0)

## 2016-06-16 LAB — BASIC METABOLIC PANEL
Anion gap: 5 (ref 5–15)
BUN: 14 mg/dL (ref 6–20)
CALCIUM: 8.8 mg/dL — AB (ref 8.9–10.3)
CHLORIDE: 100 mmol/L — AB (ref 101–111)
CO2: 28 mmol/L (ref 22–32)
CREATININE: 0.64 mg/dL (ref 0.44–1.00)
GFR calc non Af Amer: >60 mL/min (ref >60)
GLUCOSE: 123 mg/dL — AB (ref 65–99)
Potassium: 4.6 mmol/L (ref 3.5–5.1)
Sodium: 133 mmol/L — ABNORMAL LOW (ref 135–145)

## 2016-06-16 LAB — URINE MICROSCOPIC-ADD ON

## 2016-06-16 MED ORDER — ONDANSETRON HCL 4 MG/2ML IJ SOLN
4.0000 mg | Freq: Once | INTRAMUSCULAR | Status: AC
  Administered 2016-06-16: 4 mg via INTRAVENOUS
  Filled 2016-06-16: qty 2

## 2016-06-16 MED ORDER — FENTANYL CITRATE (PF) 100 MCG/2ML IJ SOLN
100.0000 ug | Freq: Once | INTRAMUSCULAR | Status: DC
  Filled 2016-06-16: qty 2

## 2016-06-16 MED ORDER — ACETAMINOPHEN 325 MG PO TABS
325.0000 mg | ORAL_TABLET | Freq: Once | ORAL | Status: AC
  Administered 2016-06-16: 325 mg via ORAL
  Filled 2016-06-16: qty 1

## 2016-06-16 MED ORDER — KETOROLAC TROMETHAMINE 15 MG/ML IJ SOLN
15.0000 mg | Freq: Once | INTRAMUSCULAR | Status: AC
  Administered 2016-06-16: 15 mg via INTRAVENOUS
  Filled 2016-06-16: qty 1

## 2016-06-16 MED ORDER — IBUPROFEN 800 MG PO TABS: 400.0000 mg | ORAL_TABLET | Freq: Three times a day (TID) | ORAL | Status: DC

## 2016-06-16 NOTE — ED Notes (Signed)
MD at bedside. [place] 

## 2016-06-16 NOTE — ED Notes (Signed)
CBG 132 per EMS.

## 2016-06-16 NOTE — ED Provider Notes (Signed)
CSN: [id number]     Arrival date & time 06/16/16  [id number] History   First MD Initiated Contact with Patient 06/16/16 0957     Chief Complaint  Patient presents with  . Near Syncope     (Consider location/radiation/quality/duration/timing/severity/associated sxs/prior Treatment) Patient is a 82 y.o. female presenting with syncope.  Loss of Consciousness Episode history:  Multiple Most recent episode:  Today Timing:  Unable to specify Progression:  Resolved Chronicity:  Recurrent Context: exertion   Witnessed: no   Relieved by:  None tried Worsened by:  Nothing tried Ineffective treatments:  None tried Associated symptoms: chest pain (afterwards)   Associated symptoms: no anxiety, no fever, no nausea, no shortness of breath, no vomiting and no weakness     Past Medical History  Diagnosis Date  . Hypertension   . Pneumonia   . Bladder infection   . Thoracic vertebral fracture ([place])     T 11, unsure type of fracture   Past Surgical History  Procedure Laterality Date  . Appendectomy    . Fracture surgery     Family History  Problem Relation Age of Onset  . Heart disease Mother   . Heart disease Father   . Hypertension Sister   . Stroke Sister   . Cancer Sister    Social History  Substance Use Topics  . Smoking status: Never Smoker   . Smokeless tobacco: Never Used  . Alcohol Use: Yes     Comment: 1 glass wine per day   OB History    No data available     Review of Systems  Constitutional: Negative for fever and chills.  Eyes: Negative for pain.  Respiratory: Negative for shortness of breath.   Cardiovascular: Positive for chest pain (afterwards) and syncope.  Gastrointestinal: Negative for nausea and vomiting.  Neurological: Positive for syncope. Negative for weakness.  All other systems reviewed and are negative.     Allergies  Compazine; Erythromycin; Lyrica; Promethazine; and Tramadol  Home Medications   Prior to Admission medications   Medication  Sig Start Date End Date Taking? Authorizing Provider  ALPRAZolam Duanne Moron) 0.5 MG tablet Take 0.25-0.5 mg by mouth at bedtime as needed for anxiety.   Yes Historical Provider, MD  diazepam (VALIUM) 5 MG tablet Take 0.5-1 tablets (2.5-5 mg total) by mouth every 8 (eight) hours as needed for muscle spasms. 10/30/15  Yes [name], MD  diphenhydrAMINE (BENADRYL) 25 MG tablet Take 25 mg by mouth every 6 (six) hours as needed for sleep.   Yes Historical Provider, MD  Estrogens, Conjugated (PREMARIN VA) Place 1 application vaginally 3 (three) times a week.   Yes Historical Provider, MD  Melatonin 5 MG TABS Take 5 mg by mouth at bedtime as needed (sleep).   Yes Historical Provider, MD  metoprolol succinate (TOPROL-XL) 25 MG 24 hr tablet Take 1 tablet (25 mg total) by mouth daily. 12/13/13  Yes Robbie Lis, MD  Multiple Vitamin (MULTIVITAMIN WITH MINERALS) TABS tablet Take 1 tablet by mouth daily.   Yes Historical Provider, MD  Polyethyl Glycol-Propyl Glycol (SYSTANE OP) Apply 2 drops to eye 3 (three) times daily as needed (dry eyes).   Yes Historical Provider, MD  ibuprofen (ADVIL,MOTRIN) 800 MG tablet Take 0.5 tablets (400 mg total) by mouth 3 (three) times daily. 06/16/16   [name], MD   BP 145/64 mmHg  Pulse 97  Temp(Src) 98 F (36.7 C) (Oral)  Resp 15  Ht 5\' 3"  (1.6 m)  Wt 115 lb (  52.164 kg)  BMI 20.38 kg/m2  SpO2 97% Physical Exam  Constitutional: She is oriented to person, place, and time. She appears well-developed and well-nourished.  HENT:  Head: Normocephalic and atraumatic.  Eyes: Conjunctivae are normal. Pupils are equal, round, and reactive to light.  Neck: Normal range of motion.  Cardiovascular: Normal rate and regular rhythm.   Pulmonary/Chest: Effort normal. No stridor. No respiratory distress. She exhibits tenderness ( severe ttp in right mid chest).  Abdominal: She exhibits no distension.  Musculoskeletal: Normal range of motion. She exhibits no edema or tenderness.   Neurological: She is alert and oriented to person, place, and time.  Skin: Skin is warm and dry.  Nursing note and vitals reviewed.   ED Course  Procedures (including critical care time) Labs Review Labs Reviewed  BASIC METABOLIC PANEL - Abnormal; Notable for the following:    Sodium 133 (*)    Chloride 100 (*)    Glucose, Bld 123 (*)    Calcium 8.8 (*)    All other components within normal limits  CBC - Abnormal; Notable for the following:    WBC 19.4 (*)    RBC 2.99 (*)    Hemoglobin 9.6 (*)    HCT 28.8 (*)    All other components within normal limits  URINALYSIS, ROUTINE W REFLEX MICROSCOPIC (NOT AT [facility]) - Abnormal; Notable for the following:    Leukocytes, UA TRACE (*)    All other components within normal limits  URINE MICROSCOPIC-ADD ON - Abnormal; Notable for the following:    Squamous Epithelial / LPF 0-5 (*)    Bacteria, UA RARE (*)    Casts HYALINE CASTS (*)    All other components within normal limits    Imaging Review Dg Chest 2 View  06/16/2016  CLINICAL DATA:  Lightheadedness EXAM: CHEST  2 VIEW COMPARISON:  12/10/2013 FINDINGS: Mild cardiac enlargement. Aortic atherosclerosis. There is no pleural effusion or edema. There are coarsened interstitial markings identified bilaterally. No airspace consolidation. There is a compression fracture within the lower thoracic spine, new from previous exam. IMPRESSION: 1. Mild cardiac enlargement and aortic atherosclerosis. 2. Age-indeterminate lower thoracic spine compression fracture. New from 12/10/2013. Electronically Signed   By: Kerby Moors M.D.   On: 06/16/2016 11:14   Dg Pelvis 1-2 Views  06/16/2016  CLINICAL DATA:  Evaluate for pelvis fracture after fall. Initial encounter. EXAM: PELVIS - 1-2 VIEW COMPARISON:  None. FINDINGS: No evidence of pelvic ring fracture or diastasis. Previous sacral insufficiency fractures without current visualization. Foreshortened appearance of the right femoral neck attributed to  rotation. No definitive hip fracture. No dislocation. Osteopenia. IMPRESSION: No acute finding.  If hip pain recommend dedicated hip series. Electronically Signed   By: Monte Fantasia M.D.   On: 06/16/2016 11:06   Ct Head Wo Contrast  06/16/2016  CLINICAL DATA:  Fall. EXAM: CT HEAD WITHOUT CONTRAST TECHNIQUE: Contiguous axial images were obtained from the base of the skull through the vertex without intravenous contrast. COMPARISON:  None. FINDINGS: Brain: There is prominence of the sulci and ventricles. Mild low attenuation within the periventricular and subcortical white matter identified compatible with chronic microvascular disease. No evidence of acute infarction, hemorrhage, extra-axial collection, ventriculomegaly, or mass effect. Vascular: No hyperdense vessel or unexpected calcification. Skull: Negative for fracture or focal lesion. Sinuses/Orbits: No acute findings. Other: None. IMPRESSION: 1. Mild brain atrophy and chronic microvascular disease. 2. No acute findings. Electronically Signed   By: Kerby Moors M.D.   On: 06/16/2016 11:20  I have personally reviewed and evaluated these images and lab results as part of my medical decision-making.   EKG Interpretation   Date/Time:  Wednesday June 16 2016 09:35:59 EDT Ventricular Rate:  87 PR Interval:    QRS Duration: 81 QT Interval:  349 QTC Calculation: 420 R Axis:   32 Text Interpretation:  Sinus rhythm Atrial premature complex Borderline low  voltage, extremity leads No previous ECGs available Confirmed by [facility]  MD, Corene Cornea [phone]) on 06/16/2016 9:45:42 AM Also confirmed by [facility] MD,  Corene Cornea [phone]), editor [place], [place], [place] [id number])  on 06/16/2016 10:01:29 AM      MDM   Final diagnoses:  Syncope, unspecified syncope type    82 year old female here with cecal episode. Similar to multiple previous episodes. Had chest pain afterwards and is tender to palpation suspect this likely incompletely imaged rib fracture versus  musculoskeletal strain. I don't pick is a pulmonary embolus nor do I think it is cardiac related. She's had multiple workups for syncope in the past and all have been negative. I think this episode is likely related to mild hypovolemia. She will continue to follow up with pcp in the next few days for reevaluation.   New Prescriptions: Discharge Medication List as of 06/16/2016  1:30 PM    START taking these medications   Details  ibuprofen (ADVIL,MOTRIN) 800 MG tablet Take 0.5 tablets (400 mg total) by mouth 3 (three) times daily., Starting 06/16/2016, Until Discontinued, Print         I have personally and contemperaneously reviewed labs and imaging and used in my decision making as above.   A medical screening exam was performed and I feel the patient has had an appropriate workup for their chief complaint at this time and likelihood of emergent condition existing is low and thus workup can continue on an outpatient basis.. Their vital signs are stable. They have been counseled on decision, discharge, follow up and which symptoms necessitate immediate return to the emergency department.  They verbally stated understanding and agreement with plan and discharged in stable condition.      [name], MD 06/16/16 [phone]

## 2016-06-16 NOTE — ED Notes (Signed)
Pt placed in fall risks socks and arm band applied.

## 2016-06-16 NOTE — ED Notes (Signed)
Pt. sts she was sitting down for breakfast and then remembers being on the floor. SHe tried to pull herself up and was unable to do so. Pt. sts she has only had some orange juice and water this AM. Pt. States pain in the chest with deep inspiration that she says feels muscular.

## 2016-06-16 NOTE — ED Notes (Signed)
Family and pt states any narcotic pain medication will make her vomit and requests a different medication.

## 2016-06-16 NOTE — ED Notes (Signed)
Pt placed on bedside commode instructed to use cal bell before standing.

## 2016-06-19 ENCOUNTER — Emergency Department (HOSPITAL_COMMUNITY): Payer: Medicare Other

## 2016-06-19 ENCOUNTER — Inpatient Hospital Stay (HOSPITAL_COMMUNITY): Payer: Medicare Other

## 2016-06-19 ENCOUNTER — Inpatient Hospital Stay (HOSPITAL_COMMUNITY)
Admission: EM | Admit: 2016-06-19 | Discharge: 2016-07-06 | DRG: 871 | Disposition: A | Discharge status: Skilled Nursing Facility | Payer: Medicare Other | Attending: Family Medicine | Admitting: Family Medicine

## 2016-06-19 ENCOUNTER — Encounter (HOSPITAL_COMMUNITY): Payer: Self-pay | Admitting: *Deleted

## 2016-06-19 DIAGNOSIS — E875 Hyperkalemia: Secondary | ICD-10-CM

## 2016-06-19 DIAGNOSIS — K7200 Acute and subacute hepatic failure without coma: Secondary | ICD-10-CM

## 2016-06-19 DIAGNOSIS — R1032 Left lower quadrant pain: Secondary | ICD-10-CM | POA: Diagnosis not present

## 2016-06-19 DIAGNOSIS — E43 Unspecified severe protein-calorie malnutrition: Secondary | ICD-10-CM | POA: Diagnosis present

## 2016-06-19 DIAGNOSIS — R17 Unspecified jaundice: Secondary | ICD-10-CM | POA: Insufficient documentation

## 2016-06-19 DIAGNOSIS — Z8249 Family history of ischemic heart disease and other diseases of the circulatory system: Secondary | ICD-10-CM

## 2016-06-19 DIAGNOSIS — A419 Sepsis, unspecified organism: Secondary | ICD-10-CM | POA: Diagnosis present

## 2016-06-19 DIAGNOSIS — K750 Abscess of liver: Secondary | ICD-10-CM | POA: Diagnosis present

## 2016-06-19 DIAGNOSIS — R7989 Other specified abnormal findings of blood chemistry: Secondary | ICD-10-CM | POA: Insufficient documentation

## 2016-06-19 DIAGNOSIS — K769 Liver disease, unspecified: Secondary | ICD-10-CM | POA: Diagnosis not present

## 2016-06-19 DIAGNOSIS — D591 Other autoimmune hemolytic anemias: Secondary | ICD-10-CM | POA: Diagnosis present

## 2016-06-19 DIAGNOSIS — D72829 Elevated white blood cell count, unspecified: Secondary | ICD-10-CM | POA: Insufficient documentation

## 2016-06-19 DIAGNOSIS — E8770 Fluid overload, unspecified: Secondary | ICD-10-CM | POA: Diagnosis not present

## 2016-06-19 DIAGNOSIS — R748 Abnormal levels of other serum enzymes: Secondary | ICD-10-CM | POA: Diagnosis not present

## 2016-06-19 DIAGNOSIS — K5900 Constipation, unspecified: Secondary | ICD-10-CM | POA: Diagnosis present

## 2016-06-19 DIAGNOSIS — R509 Fever, unspecified: Secondary | ICD-10-CM | POA: Diagnosis present

## 2016-06-19 DIAGNOSIS — L0291 Cutaneous abscess, unspecified: Secondary | ICD-10-CM

## 2016-06-19 DIAGNOSIS — D589 Hereditary hemolytic anemia, unspecified: Secondary | ICD-10-CM | POA: Insufficient documentation

## 2016-06-19 DIAGNOSIS — Z6823 Body mass index (BMI) 23.0-23.9, adult: Secondary | ICD-10-CM

## 2016-06-19 DIAGNOSIS — J189 Pneumonia, unspecified organism: Secondary | ICD-10-CM

## 2016-06-19 DIAGNOSIS — D472 Monoclonal gammopathy: Secondary | ICD-10-CM | POA: Insufficient documentation

## 2016-06-19 DIAGNOSIS — R945 Abnormal results of liver function studies: Secondary | ICD-10-CM

## 2016-06-19 DIAGNOSIS — R3911 Hesitancy of micturition: Secondary | ICD-10-CM | POA: Diagnosis not present

## 2016-06-19 DIAGNOSIS — E871 Hypo-osmolality and hyponatremia: Secondary | ICD-10-CM

## 2016-06-19 DIAGNOSIS — M48061 Spinal stenosis, lumbar region without neurogenic claudication: Secondary | ICD-10-CM | POA: Diagnosis present

## 2016-06-19 DIAGNOSIS — Z66 Do not resuscitate: Secondary | ICD-10-CM | POA: Diagnosis present

## 2016-06-19 DIAGNOSIS — R0902 Hypoxemia: Secondary | ICD-10-CM | POA: Insufficient documentation

## 2016-06-19 DIAGNOSIS — Z87440 Personal history of urinary (tract) infections: Secondary | ICD-10-CM

## 2016-06-19 DIAGNOSIS — Z9049 Acquired absence of other specified parts of digestive tract: Secondary | ICD-10-CM | POA: Diagnosis not present

## 2016-06-19 DIAGNOSIS — I10 Essential (primary) hypertension: Secondary | ICD-10-CM | POA: Diagnosis present

## 2016-06-19 DIAGNOSIS — R000 Tachycardia, unspecified: Secondary | ICD-10-CM | POA: Diagnosis present

## 2016-06-19 DIAGNOSIS — D5912 Cold autoimmune hemolytic anemia: Secondary | ICD-10-CM | POA: Insufficient documentation

## 2016-06-19 DIAGNOSIS — R160 Hepatomegaly, not elsewhere classified: Secondary | ICD-10-CM

## 2016-06-19 DIAGNOSIS — M4806 Spinal stenosis, lumbar region: Secondary | ICD-10-CM | POA: Diagnosis present

## 2016-06-19 DIAGNOSIS — N390 Urinary tract infection, site not specified: Secondary | ICD-10-CM

## 2016-06-19 DIAGNOSIS — R5081 Fever presenting with conditions classified elsewhere: Secondary | ICD-10-CM | POA: Diagnosis not present

## 2016-06-19 DIAGNOSIS — K7290 Hepatic failure, unspecified without coma: Secondary | ICD-10-CM | POA: Diagnosis present

## 2016-06-19 DIAGNOSIS — D5919 Other autoimmune hemolytic anemia: Secondary | ICD-10-CM

## 2016-06-19 DIAGNOSIS — R932 Abnormal findings on diagnostic imaging of liver and biliary tract: Secondary | ICD-10-CM | POA: Diagnosis not present

## 2016-06-19 DIAGNOSIS — E860 Dehydration: Secondary | ICD-10-CM | POA: Diagnosis present

## 2016-06-19 DIAGNOSIS — D649 Anemia, unspecified: Secondary | ICD-10-CM | POA: Diagnosis not present

## 2016-06-19 DIAGNOSIS — E440 Moderate protein-calorie malnutrition: Secondary | ICD-10-CM | POA: Insufficient documentation

## 2016-06-19 LAB — BILIRUBIN, FRACTIONATED(TOT/DIR/INDIR)
BILIRUBIN TOTAL: 6 mg/dL — AB (ref 0.3–1.2)
Bilirubin, Direct: 2 mg/dL — ABNORMAL HIGH (ref 0.1–0.5)
Indirect Bilirubin: 4 mg/dL — ABNORMAL HIGH (ref 0.3–0.9)

## 2016-06-19 LAB — CBC WITH DIFFERENTIAL/PLATELET
BAND NEUTROPHILS: 0 %
BASOS PCT: 0 %
BLASTS: 0 %
Basophils Absolute: 0 10*3/uL (ref 0.0–0.1)
EOS PCT: 0 %
Eosinophils Absolute: 0 10*3/uL (ref 0.0–0.7)
HCT: 20.3 % — ABNORMAL LOW (ref 36.0–46.0)
HEMOGLOBIN: 7.2 g/dL — AB (ref 12.0–15.0)
LYMPHS PCT: 1 %
Lymphs Abs: 0.6 10*3/uL — ABNORMAL LOW (ref 0.7–4.0)
MCH: 32.9 pg (ref 26.0–34.0)
MCHC: 35.5 g/dL (ref 30.0–36.0)
MCV: 92.7 fL (ref 78.0–100.0)
METAMYELOCYTES PCT: 0 %
MONO ABS: 2 10*3/uL — AB (ref 0.1–1.0)
MONOS PCT: 5 %
Myelocytes: 0 %
NRBC: 0 /100{WBCs}
Neutro Abs: 37.4 10*3/uL — ABNORMAL HIGH (ref 1.7–7.7)
Neutrophils Relative %: 94 %
PLATELETS: 456 10*3/uL — AB (ref 150–400)
PROMYELOCYTES ABS: 0 %
RBC: 2.19 MIL/uL — ABNORMAL LOW (ref 3.87–5.11)
RDW: 13.2 % (ref 11.5–15.5)
WBC: 40 10*3/uL — ABNORMAL HIGH (ref 4.0–10.5)

## 2016-06-19 LAB — I-STAT CG4 LACTIC ACID, ED
LACTIC ACID, VENOUS: 1.56 mmol/L (ref 0.5–1.9)
LACTIC ACID, VENOUS: 0.68 mmol/L (ref 0.5–1.9)

## 2016-06-19 LAB — URINALYSIS, ROUTINE W REFLEX MICROSCOPIC
Glucose, UA: NEGATIVE mg/dL
KETONES UR: NEGATIVE mg/dL
NITRITE: POSITIVE — AB
Protein, ur: 100 mg/dL — AB
SPECIFIC GRAVITY, URINE: 1.019 (ref 1.005–1.030)
pH: 7 (ref 5.0–8.0)

## 2016-06-19 LAB — COMPREHENSIVE METABOLIC PANEL
ALK PHOS: 190 U/L — AB (ref 38–126)
ALT: 216 U/L — AB (ref 14–54)
AST: 148 U/L — AB (ref 15–41)
Albumin: 3.3 g/dL — ABNORMAL LOW (ref 3.5–5.0)
Anion gap: 8 (ref 5–15)
BILIRUBIN TOTAL: 7 mg/dL — AB (ref 0.3–1.2)
BUN: 22 mg/dL — AB (ref 6–20)
CALCIUM: 9.3 mg/dL (ref 8.9–10.3)
CHLORIDE: 89 mmol/L — AB (ref 101–111)
CO2: 28 mmol/L (ref 22–32)
CREATININE: 0.72 mg/dL (ref 0.44–1.00)
Glucose, Bld: 138 mg/dL — ABNORMAL HIGH (ref 65–99)
Potassium: 5.7 mmol/L — ABNORMAL HIGH (ref 3.5–5.1)
Sodium: 125 mmol/L — ABNORMAL LOW (ref 135–145)
Total Protein: 6.8 g/dL (ref 6.5–8.1)

## 2016-06-19 LAB — BASIC METABOLIC PANEL
Anion gap: 9 (ref 5–15)
BUN: 18 mg/dL (ref 6–20)
CO2: 20 mmol/L — ABNORMAL LOW (ref 22–32)
CREATININE: 0.56 mg/dL (ref 0.44–1.00)
Calcium: 8.2 mg/dL — ABNORMAL LOW (ref 8.9–10.3)
Chloride: 96 mmol/L — ABNORMAL LOW (ref 101–111)
GFR calc Af Amer: >60 mL/min (ref >60)
Glucose, Bld: 115 mg/dL — ABNORMAL HIGH (ref 65–99)
POTASSIUM: 4.8 mmol/L (ref 3.5–5.1)
SODIUM: 125 mmol/L — AB (ref 135–145)

## 2016-06-19 LAB — URINE MICROSCOPIC-ADD ON

## 2016-06-19 LAB — MRSA PCR SCREENING: MRSA BY PCR: NEGATIVE

## 2016-06-19 LAB — RETICULOCYTES
RBC.: 2.19 MIL/uL — ABNORMAL LOW (ref 3.87–5.11)
Retic Count, Absolute: 92 10*3/uL (ref 19.0–186.0)
Retic Ct Pct: 4.2 % — ABNORMAL HIGH (ref 0.4–3.1)

## 2016-06-19 LAB — ACETAMINOPHEN LEVEL: Acetaminophen (Tylenol), Serum: <10 ug/mL — ABNORMAL LOW (ref 10–30)

## 2016-06-19 LAB — LACTATE DEHYDROGENASE: LDH: 163 U/L (ref 98–192)

## 2016-06-19 MED ORDER — IOPAMIDOL (ISOVUE-300) INJECTION 61%
100.0000 mL | Freq: Once | INTRAVENOUS | Status: AC | PRN
  Administered 2016-06-19: 80 mL via INTRAVENOUS

## 2016-06-19 MED ORDER — ONDANSETRON HCL 8 MG PO TABS
8.0000 mg | ORAL_TABLET | Freq: Three times a day (TID) | ORAL | Status: DC | PRN
  Administered 2016-06-22 (×2): 8 mg via ORAL
  Filled 2016-06-19 (×2): qty 2

## 2016-06-19 MED ORDER — SODIUM POLYSTYRENE SULFONATE 15 GM/60ML PO SUSP
15.0000 g | Freq: Once | ORAL | Status: AC
  Administered 2016-06-19: 15 g via ORAL
  Filled 2016-06-19: qty 60

## 2016-06-19 MED ORDER — DIPHENHYDRAMINE HCL 25 MG PO CAPS
25.0000 mg | ORAL_CAPSULE | Freq: Every evening | ORAL | Status: DC | PRN
  Administered 2016-06-19 – 2016-07-05 (×6): 25 mg via ORAL
  Filled 2016-06-19 (×6): qty 1

## 2016-06-19 MED ORDER — SODIUM CHLORIDE 0.9 % IV SOLN: Freq: Once | INTRAVENOUS | Status: DC

## 2016-06-19 MED ORDER — ONDANSETRON HCL 4 MG PO TABS
4.0000 mg | ORAL_TABLET | Freq: Four times a day (QID) | ORAL | Status: DC | PRN
  Administered 2016-06-21 – 2016-06-30 (×2): 4 mg via ORAL
  Filled 2016-06-19 (×2): qty 1

## 2016-06-19 MED ORDER — KETOROLAC TROMETHAMINE 15 MG/ML IJ SOLN
7.5000 mg | Freq: Three times a day (TID) | INTRAMUSCULAR | Status: DC | PRN
  Administered 2016-06-19 – 2016-06-22 (×6): 7.5 mg via INTRAVENOUS
  Filled 2016-06-19 (×7): qty 1

## 2016-06-19 MED ORDER — SODIUM CHLORIDE 0.9 % IV SOLN
INTRAVENOUS | Status: DC
  Administered 2016-06-19 – 2016-06-24 (×5): via INTRAVENOUS

## 2016-06-19 MED ORDER — VANCOMYCIN HCL 500 MG IV SOLR
500.0000 mg | INTRAVENOUS | Status: DC
  Administered 2016-06-20 – 2016-06-22 (×3): 500 mg via INTRAVENOUS
  Filled 2016-06-19 (×4): qty 500

## 2016-06-19 MED ORDER — SODIUM CHLORIDE 0.9 % IV SOLN
INTRAVENOUS | Status: AC
  Administered 2016-06-19: 17:00:00 via INTRAVENOUS

## 2016-06-19 MED ORDER — ACETAMINOPHEN 650 MG RE SUPP: 650.0000 mg | Freq: Four times a day (QID) | RECTAL | Status: DC | PRN

## 2016-06-19 MED ORDER — METOPROLOL SUCCINATE ER 25 MG PO TB24
25.0000 mg | ORAL_TABLET | Freq: Every day | ORAL | Status: DC
  Administered 2016-06-21 – 2016-07-06 (×16): 25 mg via ORAL
  Filled 2016-06-19 (×16): qty 1

## 2016-06-19 MED ORDER — SODIUM CHLORIDE 0.9 % IV BOLUS (SEPSIS)
500.0000 mL | Freq: Once | INTRAVENOUS | Status: AC
  Administered 2016-06-19: 500 mL via INTRAVENOUS

## 2016-06-19 MED ORDER — SODIUM CHLORIDE 0.9 % IV BOLUS (SEPSIS)
1000.0000 mL | Freq: Once | INTRAVENOUS | Status: AC
  Administered 2016-06-19 (×2): 1000 mL via INTRAVENOUS

## 2016-06-19 MED ORDER — SODIUM CHLORIDE 0.9 % IV BOLUS (SEPSIS)
250.0000 mL | Freq: Once | INTRAVENOUS | Status: AC
  Administered 2016-06-19: 250 mL via INTRAVENOUS

## 2016-06-19 MED ORDER — SODIUM CHLORIDE 0.9% FLUSH
3.0000 mL | Freq: Two times a day (BID) | INTRAVENOUS | Status: DC
  Administered 2016-06-19 – 2016-07-04 (×19): 3 mL via INTRAVENOUS

## 2016-06-19 MED ORDER — VANCOMYCIN HCL IN DEXTROSE 1-5 GM/200ML-% IV SOLN
1000.0000 mg | Freq: Once | INTRAVENOUS | Status: AC
  Administered 2016-06-19: 1000 mg via INTRAVENOUS
  Filled 2016-06-19: qty 200

## 2016-06-19 MED ORDER — IBUPROFEN 200 MG PO TABS
400.0000 mg | ORAL_TABLET | Freq: Four times a day (QID) | ORAL | Status: DC | PRN
  Administered 2016-06-28: 400 mg via ORAL
  Filled 2016-06-19: qty 2

## 2016-06-19 MED ORDER — ESTROGENS, CONJUGATED 0.625 MG/GM VA CREA
1.0000 | TOPICAL_CREAM | VAGINAL | Status: DC
  Administered 2016-06-25: 1 via VAGINAL
  Filled 2016-06-19 (×2): qty 30

## 2016-06-19 MED ORDER — PIPERACILLIN-TAZOBACTAM 3.375 G IVPB 30 MIN
3.3750 g | Freq: Once | INTRAVENOUS | Status: AC
  Administered 2016-06-19: 3.375 g via INTRAVENOUS
  Filled 2016-06-19: qty 50

## 2016-06-19 MED ORDER — CETYLPYRIDINIUM CHLORIDE 0.05 % MT LIQD
7.0000 mL | Freq: Two times a day (BID) | OROMUCOSAL | Status: DC
  Administered 2016-06-19 – 2016-07-05 (×27): 7 mL via OROMUCOSAL

## 2016-06-19 MED ORDER — BENZONATATE 100 MG PO CAPS
100.0000 mg | ORAL_CAPSULE | Freq: Three times a day (TID) | ORAL | Status: DC | PRN
  Administered 2016-06-23 – 2016-06-25 (×2): 100 mg via ORAL
  Filled 2016-06-19 (×2): qty 1

## 2016-06-19 MED ORDER — ACETAMINOPHEN 325 MG PO TABS
650.0000 mg | ORAL_TABLET | Freq: Four times a day (QID) | ORAL | Status: DC | PRN
  Administered 2016-06-19 – 2016-06-20 (×3): 650 mg via ORAL
  Filled 2016-06-19 (×3): qty 2

## 2016-06-19 MED ORDER — DIATRIZOATE MEGLUMINE & SODIUM 66-10 % PO SOLN
30.0000 mL | Freq: Once | ORAL | Status: AC
  Administered 2016-06-19: 30 mL via ORAL
  Filled 2016-06-19: qty 30

## 2016-06-19 MED ORDER — ONDANSETRON HCL 4 MG/2ML IJ SOLN
4.0000 mg | Freq: Four times a day (QID) | INTRAMUSCULAR | Status: DC | PRN
  Administered 2016-06-19 – 2016-07-03 (×3): 4 mg via INTRAVENOUS
  Filled 2016-06-19 (×3): qty 2

## 2016-06-19 MED ORDER — PIPERACILLIN-TAZOBACTAM 3.375 G IVPB
3.3750 g | Freq: Three times a day (TID) | INTRAVENOUS | Status: DC
  Administered 2016-06-19 – 2016-06-27 (×23): 3.375 g via INTRAVENOUS
  Filled 2016-06-19 (×24): qty 50

## 2016-06-19 MED ORDER — ADULT MULTIVITAMIN W/MINERALS CH
1.0000 | ORAL_TABLET | Freq: Every day | ORAL | Status: DC
  Administered 2016-06-20 – 2016-07-06 (×13): 1 via ORAL
  Filled 2016-06-19 (×17): qty 1

## 2016-06-19 NOTE — ED Notes (Signed)
Started 20 minute timer with Amy, in ICU

## 2016-06-19 NOTE — ED Notes (Signed)
Placed patient on 2 liters of O2.

## 2016-06-19 NOTE — ED Notes (Signed)
Bed: WA15 Expected date:  Expected time:  Means of arrival:  Comments: EMS-sepsis 

## 2016-06-19 NOTE — ED Provider Notes (Signed)
CSN: [id number]     Arrival date & time 06/19/16  1219 History   First MD Initiated Contact with Patient 06/19/16 1227     Chief Complaint  Patient presents with  . Weakness  . Fever     (Consider location/radiation/quality/duration/timing/severity/associated sxs/prior Treatment) HPI Patient presents 3 days after being evaluated here, now with concern for persistent weakness, pain in her upper back, anterior chest. Patient recalls a near syncope event 3 days ago, following which she was seen here. She notes that since returning home she has had persistent weakness, pain in the aforementioned areas per Pain is sore, moderate, persistent. She also complains of fever and anorexia, but denies vomiting, diarrhea per She denies confusion, disorientation. No new medication, diet, activity.  Past Medical History  Diagnosis Date  . Hypertension   . Pneumonia   . Bladder infection   . Thoracic vertebral fracture ([place])     T 11, unsure type of fracture   Past Surgical History  Procedure Laterality Date  . Appendectomy    . Fracture surgery     Family History  Problem Relation Age of Onset  . Heart disease Mother   . Heart disease Father   . Hypertension Sister   . Stroke Sister   . Cancer Sister    Social History  Substance Use Topics  . Smoking status: Never Smoker   . Smokeless tobacco: Never Used  . Alcohol Use: Yes     Comment: 1 glass wine per day   OB History    No data available     Review of Systems  Constitutional:       Per HPI, otherwise negative  HENT:       Per HPI, otherwise negative  Respiratory:       Per HPI, otherwise negative  Cardiovascular:       Per HPI, otherwise negative  Gastrointestinal: Negative for vomiting.  Endocrine:       Negative aside from HPI  Genitourinary:       Neg aside from HPI   Musculoskeletal:       Per HPI, otherwise negative  Skin: Negative.   Neurological: Positive for weakness and light-headedness. Negative for  syncope.      Allergies  Compazine; Erythromycin; Lyrica; Promethazine; and Tramadol  Home Medications   Prior to Admission medications   Medication Sig Start Date End Date Taking? Authorizing Provider  ALPRAZolam Duanne Moron) 0.5 MG tablet Take 0.25-0.5 mg by mouth at bedtime as needed for anxiety.    Historical Provider, MD  diazepam (VALIUM) 5 MG tablet Take 0.5-1 tablets (2.5-5 mg total) by mouth every 8 (eight) hours as needed for muscle spasms. 10/30/15   [name], MD  diphenhydrAMINE (BENADRYL) 25 MG tablet Take 25 mg by mouth every 6 (six) hours as needed for sleep.    Historical Provider, MD  Estrogens, Conjugated (PREMARIN VA) Place 1 application vaginally 3 (three) times a week.    Historical Provider, MD  ibuprofen (ADVIL,MOTRIN) 800 MG tablet Take 0.5 tablets (400 mg total) by mouth 3 (three) times daily. 06/16/16   [name], MD  Melatonin 5 MG TABS Take 5 mg by mouth at bedtime as needed (sleep).    Historical Provider, MD  metoprolol succinate (TOPROL-XL) 25 MG 24 hr tablet Take 1 tablet (25 mg total) by mouth daily. 12/13/13   Robbie Lis, MD  Multiple Vitamin (MULTIVITAMIN WITH MINERALS) TABS tablet Take 1 tablet by mouth daily.    Historical Provider, MD  Polyethyl Glycol-Propyl Glycol (SYSTANE OP) Apply 2 drops to eye 3 (three) times daily as needed (dry eyes).    Historical Provider, MD   BP 155/79 mmHg  Pulse 137  Temp(Src) 102.9 F (39.4 C) (Oral)  Resp 24  SpO2 93% Physical Exam  Constitutional: She is oriented to person, place, and time. She has a sickly appearance. No distress.  HENT:  Head: Normocephalic and atraumatic.  Eyes: Conjunctivae and EOM are normal.  Cardiovascular: Regular rhythm.  Tachycardia present.   Pulmonary/Chest: Effort normal and breath sounds normal. No stridor. Tachypnea noted. No respiratory distress.  Abdominal: She exhibits no distension.  Musculoskeletal: She exhibits no edema.  Neurological: She is alert and oriented to  person, place, and time. No cranial nerve deficit.  Skin: Skin is warm and dry.     Psychiatric: She has a normal mood and affect.  Nursing note and vitals reviewed.   ED Course  Procedures (including critical care time)  Labs Review Labs Reviewed  COMPREHENSIVE METABOLIC PANEL - Abnormal; Notable for the following:    Sodium 125 (*)    Potassium 5.7 (*)    Chloride 89 (*)    Glucose, Bld 138 (*)    BUN 22 (*)    Albumin 3.3 (*)    AST 148 (*)    ALT 216 (*)    Alkaline Phosphatase 190 (*)    Total Bilirubin 7.0 (*)    All other components within normal limits  URINALYSIS, ROUTINE W REFLEX MICROSCOPIC (NOT AT [facility]) - Abnormal; Notable for the following:    Color, Urine ORANGE (*)    APPearance CLOUDY (*)    Hgb urine dipstick MODERATE (*)    Bilirubin Urine MODERATE (*)    Protein, ur 100 (*)    Nitrite POSITIVE (*)    Leukocytes, UA SMALL (*)    All other components within normal limits  CBC WITH DIFFERENTIAL/PLATELET - Abnormal; Notable for the following:    WBC 40.0 (*)    RBC 2.19 (*)    Hemoglobin 7.2 (*)    HCT 20.3 (*)    Platelets 456 (*)    Neutro Abs 37.4 (*)    Lymphs Abs 0.6 (*)    Monocytes Absolute 2.0 (*)    All other components within normal limits  URINE MICROSCOPIC-ADD ON - Abnormal; Notable for the following:    Squamous Epithelial / LPF 6-30 (*)    Bacteria, UA MANY (*)    Casts GRANULAR CAST (*)    All other components within normal limits  CULTURE, BLOOD (ROUTINE X 2)  CULTURE, BLOOD (ROUTINE X 2)  URINE CULTURE  I-STAT CG4 LACTIC ACID, ED  I-STAT CG4 LACTIC ACID, ED       Imaging Review Dg Chest 2 View  06/19/2016  CLINICAL DATA:  Fever EXAM: CHEST  2 VIEW COMPARISON:  06/16/2016 chest radiograph. FINDINGS: Stable cardiomediastinal silhouette with mild cardiomegaly. No pneumothorax. No pleural effusion. No overt pulmonary edema. New mild patchy opacity at the right lung base. Interval stability of moderate to severe lower thoracic  vertebral compression fracture. IMPRESSION: 1. New mild patchy right lung base opacity, cannot exclude aspiration or pneumonia. Recommend follow-up PA and lateral post treatment chest radiographs in 4-6 weeks. 2. Stable mild cardiomegaly without overt pulmonary edema. Electronically Signed   By: Ilona Sorrel M.D.   On: 06/19/2016 13:57   US Abdomen Complete  06/19/2016  CLINICAL DATA:  Hyperbilirubinemia, history hypertension EXAM: ABDOMEN ULTRASOUND COMPLETE COMPARISON:  None FINDINGS: Gallbladder:  Normally distended. Few tiny dependent calculi at gallbladder neck. No gallbladder wall thickening pericholecystic fluid. Non shadowing non mobile echogenic focus at mid gallbladder most consistent with polyp 6 mm diameter. No sonographic Murphy sign. Common bile duct: Diameter: Question leaned visualized, question 2 mm diameter Liver: Normal echogenicity. Mass lesion centrally RIGHT lobe 3.6 x 3.6 x 3.6 cm, complex hypoechoic with few areas partial septation. No internal blood flow on color Doppler imaging within this lesion. A second lesion posteriorly RIGHT lobe liver, 5.1 x 4.1 x 5.0 cm, similar characteristics. These require further assessment by CT or MR. No additional focal hepatic abnormalities. Hepatopetal portal venous flow. IVC: Normal appearance Pancreas: Normal parenchymal echogenicity. Small cystic lesion at pancreatic tail 10 x 8 x 9 mm with an additional questionable tiny cystic lesion at pancreatic tail 9 x 5 x 8 mm. Spleen: Normal appearance, 5.8 cm length Right Kidney: Length: 9.1 cm. Mild collecting system dilatation/hydronephrosis. No mass or calcification. Left Kidney: Length: 10.9 cm. Suboptimal visualization to body habitus. No gross evidence of mass or hydronephrosis. Abdominal aorta: Normal caliber with scattered atherosclerotic change. Other findings: No free fluid IMPRESSION: 2 hepatic masses are identified measuring 3.6cm nad 5.1 cm in greatest sizes, both complex predominately hypoechoic  without internal blood flow, uncertain etiology; further characterization of these lesions by MR or CT imaging with and without contrast recommended. Tiny nonspecific cystic lesions at pancreatic tail. Cholelithiasis with additional 6 mm gallbladder polyp, likely benign based on size, no further workup required; This recommendation follows ACR consensus guidelines: White Paper of the ACR Incidental Findings Committee II on Gallbladder and Biliary Findings. J Am Coll Radiol 2013:;10:953-956. Electronically Signed   By: Lavonia Dana M.D.   On: 06/19/2016 15:15   I have personally reviewed and evaluated these images and lab results as part of my medical decision-making.   EKG Interpretation   Date/Time:  Saturday June 19 2016 12:29:59 EDT Ventricular Rate:  142 PR Interval:    QRS Duration: 82 QT Interval:  257 QTC Calculation: 394 R Axis:   11 Text Interpretation:  Sinus tachycardia T wave abnormality abn Confirmed  by Carmin Muskrat  MD [phone]) on 06/19/2016 12:46:29 PM     On arrival the patient is tachypneic, tachycardic, febrile, and with increased work of breathing, patient received empiric therapy for sepsis, with appropriate fluid bolus, broad-spectrum antibiotics.  Initial lab values notable for acute liver failure, leukocytosis, 40,000.  Patient also has urinary tract infection, possible pneumonia.  3:38 PM Repeat exam the patient states that she feels better. She remains tachycardic. Discussed all findings with patient and her daughter, and measures. We discussed the need for admission, continued monitoring, additional studies of her liver dysfunction.   MDM   Final diagnoses:  Acute liver failure without hepatic coma  UTI (lower urinary tract infection)  CAP (community acquired pneumonia)   Old female presents with concern for ongoing weakness. On exam the patient is notably tachycardic, as well as jaundiced. Given concern of sepsis, given the initial fever,  tachycardia, tachypnea, patient had broad-spectrum antibiotics, fluid resuscitation per protocol. Patient significant improvement. However, the patient was found in multiple notable abnormalities, including critical cytosis of greater than 40,000, hyperkalemia, hyponatremia, acute liver failure. Patient also has possible pneumonia. Given the patient's multiple abnormalities, she required admission to the stepdown unit for further evaluation, management.  CRITICAL CARE Performed by: Carmin Muskrat Total critical care time: 40 minutes Critical care time was exclusive of separately billable procedures and treating other patients. Critical  care was necessary to treat or prevent imminent or life-threatening deterioration. Critical care was time spent personally by me on the following activities: development of treatment plan with patient and/or surrogate as well as nursing, discussions with consultants, evaluation of patient's response to treatment, examination of patient, obtaining history from patient or surrogate, ordering and performing treatments and interventions, ordering and review of laboratory studies, ordering and review of radiographic studies, pulse oximetry and re-evaluation of patient's condition.   Carmin Muskrat, MD 06/19/16 1540

## 2016-06-19 NOTE — ED Notes (Addendum)
EMS reports pts daughter called due to mother having fever and weakness, pt has been feeling worse over the last little while, recently seen at [facility] for Syncope, pt appearance weak and jaundice with fever and tachycardia, pain reported upper abd radiating to back. EMS 103 (o) - 130- 18 150/90

## 2016-06-19 NOTE — Progress Notes (Signed)
Pharmacy Antibiotic Note  Brianna [name] is a 82 y.o. female admitted on 06/19/2016 with sepsis.  Pharmacy has been consulted for Vancomycin and Zosyn dosing.  SCr appears at baseline, CrCl ~30 ml/min CG / 43 N using rounded SCr 1  Plan: Zosyn 3.375g IV x1 over 30 min in ED, then 3.375g q8h (infuse over 4 hours) Vancomycin 1g x1 ordered in ED, then 500mg  IV q24h F/u renal fxn, cultures, clinical course    Temp (24hrs), Avg:102.9 F (39.4 C), Min:102.9 F (39.4 C), Max:102.9 F (39.4 C)   Recent Labs Lab 06/16/16 0958  WBC 19.4*  CREATININE 0.64    Estimated Creatinine Clearance: 38.5 mL/min (by C-G formula based on Cr of 0.64).    Allergies  Allergen Reactions  . Compazine [Prochlorperazine] Other (See Comments)    Restless, hallucinations  . Erythromycin     "passed out"  . Lyrica [Pregabalin] Other (See Comments)    Sleep walking  . Promethazine Other (See Comments)    Restless, hallucinations  . Tramadol Nausea And Vomiting    Antimicrobials this admission: 7/8 vancomycin >>  7/8 zosyn >>   Dose adjustments this admission:   Microbiology results: 7/8 BCx: ordered 7/8 UCx: ordered   Thank you for allowing pharmacy to be a part of this patient's care.  Ralene Bathe, PharmD, BCPS 06/19/2016, 12:51 PM  Pager: [phone]

## 2016-06-19 NOTE — ED Notes (Signed)
[facility] called for code sepsis

## 2016-06-19 NOTE — H&P (Signed)
History and Physical    Brianna [name] [id number] DOB: 05-25-1924 DOA: 06/19/2016  PCP: Tawanna Solo, MD  Patient coming from: Home   Chief Complaint: fever.   HPI: Brianna [name] is a 82 y.o. female with medical history significant of HTN, Thoracic vertebral fracture, who presents today with fevers, jaundice that started during last 24 hours. Patient relates that she was not feeling well on July 5, she pass out that day. She has been having ribs, chest pain since that day after she try to pull her self up.   She was evaluated in the ED at that time , had negative CT and was discharge home.  She was not feeling well today, she has been having nausea, poor oral intake and appetite. Patient had a tempeture at 103, dark urine and was notice to be jaundice by daughter. Patient denies abdominal pain or dysuria.   Daughter report 10 pounds weight loss in short period of time.  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    ED Course: Found to be febrile, sodium 125, k at 5.7, alkaline phosphatase 190, AST 148, ALT 216, bili 7.   Review of Systems: As per HPI otherwise 10 point review of systems negative.    Past Medical History  Diagnosis Date  . Hypertension   . Pneumonia   . Bladder infection   . Thoracic vertebral fracture ([place])     T 11, unsure type of fracture    Past Surgical History  Procedure Laterality Date  . Appendectomy    . Fracture surgery       reports that she has never smoked. She has never used smokeless tobacco. She reports that she drinks alcohol. She reports that she does not use illicit drugs.  Allergies  Allergen Reactions  . Other Nausea And Vomiting    *All pain medications*  . Compazine [Prochlorperazine] Other (See Comments)    Restless, hallucinations  . Erythromycin     "passed out"  . Lyrica [Pregabalin] Other (See Comments)    Sleep walking  . Promethazine Other (See Comments)    Restless, hallucinations  .  Tramadol Nausea And Vomiting    Family History  Problem Relation Age of Onset  . Heart disease Mother   . Heart disease Father   . Hypertension Sister   . Stroke Sister   . Cancer Sister      Prior to Admission medications   Medication Sig Start Date End Date Taking? Authorizing Provider  acetaminophen (TYLENOL) 500 MG tablet Take 1,000 mg by mouth every 6 (six) hours as needed for mild pain, moderate pain, fever or headache.   Yes Historical Provider, MD  conjugated estrogens (PREMARIN) vaginal cream Place 1 Applicatorful vaginally 3 (three) times a week.   Yes Historical Provider, MD  diphenhydrAMINE (BENADRYL) 25 MG tablet Take 25 mg by mouth at bedtime as needed for sleep.    Yes Historical Provider, MD  famotidine (PEPCID) 10 MG tablet Take 10 mg by mouth daily as needed for heartburn or indigestion.   Yes Historical Provider, MD  ibuprofen (ADVIL,MOTRIN) 200 MG tablet Take 400 mg by mouth every 6 (six) hours as needed for fever, headache, mild pain, moderate pain or cramping.   Yes Historical Provider, MD  Melatonin 5 MG TABS Take 5 mg by mouth at bedtime as needed (sleep).   Yes Historical Provider, MD  metoprolol succinate (TOPROL-XL) 25 MG 24 hr tablet Take 1 tablet (25 mg total) by mouth daily. 12/13/13  Yes Robbie Lis, MD  Multiple Vitamin (MULTIVITAMIN WITH MINERALS) TABS tablet Take 1 tablet by mouth daily.   Yes Historical Provider, MD  ondansetron (ZOFRAN) 8 MG tablet Take 8 mg by mouth every 8 (eight) hours as needed for nausea or vomiting.   Yes Historical Provider, MD  Polyethyl Glycol-Propyl Glycol (SYSTANE OP) Apply 2 drops to eye 3 (three) times daily as needed (dry eyes).   Yes Historical Provider, MD  ibuprofen (ADVIL,MOTRIN) 800 MG tablet Take 0.5 tablets (400 mg total) by mouth 3 (three) times daily. Patient not taking: Reported on 06/19/2016 06/16/16   [name], MD    Physical Exam: Filed Vitals:   06/19/16 1455 06/19/16 1501 06/19/16 1602 06/19/16 1639  BP:  140/67 136/66 134/60 129/73  Pulse: 128 128 121 122  Temp:  99.6 F (37.6 C)    TempSrc:      Resp: 23 15 19 21   Height:      Weight:      SpO2: 100% 100% 98% 100%      Constitutional: NAD, calm, comfortable, icteric  Filed Vitals:   06/19/16 1455 06/19/16 1501 06/19/16 1602 06/19/16 1639  BP: 140/67 136/66 134/60 129/73  Pulse: 128 128 121 122  Temp:  99.6 F (37.6 C)    TempSrc:      Resp: 23 15 19 21   Height:      Weight:      SpO2: 100% 100% 98% 100%   Eyes: PERRL, lids and conjunctivae normal ENMT: Mucous membranes are moist. Posterior pharynx clear of any exudate or lesions.Normal dentition.  Neck: normal, supple, no masses, no thyromegaly Respiratory: clear to auscultation bilaterally, no wheezing, no crackles. Normal respiratory effort. No accessory muscle use.  Cardiovascular: Regular rate and rhythm, no murmurs / rubs / gallops. No extremity edema. 2+ pedal pulses. No carotid bruits.  Abdomen: no tenderness, no masses palpated. No hepatosplenomegaly. Bowel sounds positive. Distended.  Musculoskeletal: no clubbing / cyanosis. No joint deformity upper and lower extremities. Good ROM, no contractures. Normal muscle tone.  Skin: no rashes, lesions, ulcers. No induration Neurologic: CN 2-12 grossly intact. Sensation intact, DTR normal. Strength 5/5 in all 4.  Psychiatric: Normal judgment and insight. Alert and oriented x 3. Normal mood.     Labs on Admission: I have personally reviewed following labs and imaging studies  CBC:  Recent Labs Lab 06/16/16 0958 06/19/16 1256  WBC 19.4* 40.0*  NEUTROABS  --  37.4*  HGB 9.6* 7.2*  HCT 28.8* 20.3*  MCV 96.3 92.7  PLT 379 [id number]*   Basic Metabolic Panel:  Recent Labs Lab 06/16/16 0958 06/19/16 1256  NA 133* 125*  K 4.6 5.7*  CL 100* 89*  CO2 28 28  GLUCOSE 123* 138*  BUN 14 22*  CREATININE 0.64 0.72  CALCIUM 8.8* 9.3   GFR: Estimated Creatinine Clearance: 38.5 mL/min (by C-G formula based on Cr of  0.72). Liver Function Tests:  Recent Labs Lab 06/19/16 1256  AST 148*  ALT 216*  ALKPHOS 190*  BILITOT 7.0*  PROT 6.8  ALBUMIN 3.3*   No results for input(s): LIPASE, AMYLASE in the last 168 hours. No results for input(s): AMMONIA in the last 168 hours. Coagulation Profile: No results for input(s): INR, PROTIME in the last 168 hours. Cardiac Enzymes: No results for input(s): CKTOTAL, CKMB, CKMBINDEX, TROPONINI in the last 168 hours. BNP (last 3 results) No results for input(s): PROBNP in the last 8760 hours. HbA1C: No results for input(s): HGBA1C in the last  72 hours. CBG: No results for input(s): GLUCAP in the last 168 hours. Lipid Profile: No results for input(s): CHOL, HDL, LDLCALC, TRIG, CHOLHDL, LDLDIRECT in the last 72 hours. Thyroid Function Tests: No results for input(s): TSH, T4TOTAL, FREET4, T3FREE, THYROIDAB in the last 72 hours. Anemia Panel: No results for input(s): VITAMINB12, FOLATE, FERRITIN, TIBC, IRON, RETICCTPCT in the last 72 hours. Urine analysis:    Component Value Date/Time   COLORURINE ORANGE* 06/19/2016 1331   APPEARANCEUR CLOUDY* 06/19/2016 1331   LABSPEC 1.019 06/19/2016 1331   PHURINE 7.0 06/19/2016 1331   GLUCOSEU NEGATIVE 06/19/2016 1331   HGBUR MODERATE* 06/19/2016 1331   BILIRUBINUR MODERATE* 06/19/2016 1331   KETONESUR NEGATIVE 06/19/2016 1331   PROTEINUR 100* 06/19/2016 1331   UROBILINOGEN 1.0 12/10/2013 0452   NITRITE POSITIVE* 06/19/2016 1331   LEUKOCYTESUR SMALL* 06/19/2016 1331   Sepsis Labs: !!!!!!!!!!!!!!!!!!!!!!!!!!!!!!!!!!!!!!!!!!!! @LABRCNTIP (procalcitonin:4,lacticidven:4) )No results found for this or any previous visit (from the past 240 hour(s)).   Radiological Exams on Admission: Dg Chest 2 View  06/19/2016  CLINICAL DATA:  Fever EXAM: CHEST  2 VIEW COMPARISON:  06/16/2016 chest radiograph. FINDINGS: Stable cardiomediastinal silhouette with mild cardiomegaly. No pneumothorax. No pleural effusion. No overt pulmonary  edema. New mild patchy opacity at the right lung base. Interval stability of moderate to severe lower thoracic vertebral compression fracture. IMPRESSION: 1. New mild patchy right lung base opacity, cannot exclude aspiration or pneumonia. Recommend follow-up PA and lateral post treatment chest radiographs in 4-6 weeks. 2. Stable mild cardiomegaly without overt pulmonary edema. Electronically Signed   By: Ilona Sorrel M.D.   On: 06/19/2016 13:57   US Abdomen Complete  06/19/2016  CLINICAL DATA:  Hyperbilirubinemia, history hypertension EXAM: ABDOMEN ULTRASOUND COMPLETE COMPARISON:  None FINDINGS: Gallbladder: Normally distended. Few tiny dependent calculi at gallbladder neck. No gallbladder wall thickening pericholecystic fluid. Non shadowing non mobile echogenic focus at mid gallbladder most consistent with polyp 6 mm diameter. No sonographic Murphy sign. Common bile duct: Diameter: Question leaned visualized, question 2 mm diameter Liver: Normal echogenicity. Mass lesion centrally RIGHT lobe 3.6 x 3.6 x 3.6 cm, complex hypoechoic with few areas partial septation. No internal blood flow on color Doppler imaging within this lesion. A second lesion posteriorly RIGHT lobe liver, 5.1 x 4.1 x 5.0 cm, similar characteristics. These require further assessment by CT or MR. No additional focal hepatic abnormalities. Hepatopetal portal venous flow. IVC: Normal appearance Pancreas: Normal parenchymal echogenicity. Small cystic lesion at pancreatic tail 10 x 8 x 9 mm with an additional questionable tiny cystic lesion at pancreatic tail 9 x 5 x 8 mm. Spleen: Normal appearance, 5.8 cm length Right Kidney: Length: 9.1 cm. Mild collecting system dilatation/hydronephrosis. No mass or calcification. Left Kidney: Length: 10.9 cm. Suboptimal visualization to body habitus. No gross evidence of mass or hydronephrosis. Abdominal aorta: Normal caliber with scattered atherosclerotic change. Other findings: No free fluid IMPRESSION: 2  hepatic masses are identified measuring 3.6cm nad 5.1 cm in greatest sizes, both complex predominately hypoechoic without internal blood flow, uncertain etiology; further characterization of these lesions by MR or CT imaging with and without contrast recommended. Tiny nonspecific cystic lesions at pancreatic tail. Cholelithiasis with additional 6 mm gallbladder polyp, likely benign based on size, no further workup required; This recommendation follows ACR consensus guidelines: White Paper of the ACR Incidental Findings Committee II on Gallbladder and Biliary Findings. J Am Coll Radiol 2013:;10:953-956. Electronically Signed   By: Lavonia Dana M.D.   On: 06/19/2016 15:15    EKG: Independently  reviewed. Sinus tachycardia/   Assessment/Plan Active Problems:   Spinal stenosis of lumbar region   Liver failure (HCC)   Sepsis (HCC)   Hyponatremia   Hyperkalemia   Liver masses   1-Sepsis;  Presents with leukocytosis, transaminases, fever.  Source ? UTI, PNA vs Liver abscess.  IV vancomycin and Zosyn.  Blood culture, urine culture.  IV fluids.   2-Hyperbilirubinemia, transaminases, liver Masses;  Concern for liver abscess, vs malignancy.  No bile duct obstruction on US/. Discussed with GI, will do CT abdomen and pelvis with contrast./  GI will see patient in am.   3-Anemia; drop in hemoglobin since last labs done 2 days ago.  Has prior history of cold agglutinin  Will check bili fraction and LDH to evaluate for hemolysis.  Will transfuse one unit.   4-PNA; chest x ray with possible PNA>  IV antibiotics.   5-Hyponatremia; Suspect related to hypovolemia.  IV fluids.  Repeat labs in am.   6-Hyperkalemia; will order kayexalate. Repeat labs tonight.    DVT prophylaxis: scd.  Code Status: wishes to be DNR  Family Communication: care discussed with daughter and granddaughter  Disposition Plan: to be determine Consults called: GI, they will see patient in am.  Admission status:  inpatient,step down unit    Niel Hummer A MD [facility] Pager [phone]  If 7PM-7AM, please contact night-coverage [email or web address] Password [email or web address]  06/19/2016, 5:05 PM

## 2016-06-20 DIAGNOSIS — R748 Abnormal levels of other serum enzymes: Secondary | ICD-10-CM

## 2016-06-20 DIAGNOSIS — R7989 Other specified abnormal findings of blood chemistry: Secondary | ICD-10-CM

## 2016-06-20 DIAGNOSIS — D72829 Elevated white blood cell count, unspecified: Secondary | ICD-10-CM

## 2016-06-20 DIAGNOSIS — D649 Anemia, unspecified: Secondary | ICD-10-CM

## 2016-06-20 DIAGNOSIS — R509 Fever, unspecified: Secondary | ICD-10-CM

## 2016-06-20 DIAGNOSIS — R160 Hepatomegaly, not elsewhere classified: Secondary | ICD-10-CM

## 2016-06-20 DIAGNOSIS — R932 Abnormal findings on diagnostic imaging of liver and biliary tract: Secondary | ICD-10-CM

## 2016-06-20 DIAGNOSIS — R5081 Fever presenting with conditions classified elsewhere: Secondary | ICD-10-CM

## 2016-06-20 DIAGNOSIS — D591 Other autoimmune hemolytic anemias: Secondary | ICD-10-CM

## 2016-06-20 LAB — URINE CULTURE: Culture: <10000 — AB

## 2016-06-20 LAB — BASIC METABOLIC PANEL
ANION GAP: 7 (ref 5–15)
BUN: 21 mg/dL — ABNORMAL HIGH (ref 6–20)
CHLORIDE: 97 mmol/L — AB (ref 101–111)
CO2: 24 mmol/L (ref 22–32)
CREATININE: 0.61 mg/dL (ref 0.44–1.00)
Calcium: 7.5 mg/dL — ABNORMAL LOW (ref 8.9–10.3)
Glucose, Bld: 122 mg/dL — ABNORMAL HIGH (ref 65–99)
POTASSIUM: 3.8 mmol/L (ref 3.5–5.1)
Sodium: 128 mmol/L — ABNORMAL LOW (ref 135–145)

## 2016-06-20 LAB — PROTIME-INR
INR: 1.31 (ref 0.00–1.49)
INR: 1.22 (ref 0.00–1.49)
PROTHROMBIN TIME: 16.4 s — AB (ref 11.6–15.2)
PROTHROMBIN TIME: 15.5 s — AB (ref 11.6–15.2)

## 2016-06-20 LAB — CBC
HCT: 15.4 % — ABNORMAL LOW (ref 36.0–46.0)
HEMOGLOBIN: 5.6 g/dL — AB (ref 12.0–15.0)
MCH: 33.3 pg (ref 26.0–34.0)
MCHC: 36.4 g/dL — AB (ref 30.0–36.0)
MCV: 91.7 fL (ref 78.0–100.0)
Platelets: 333 10*3/uL (ref 150–400)
RBC: 1.68 MIL/uL — ABNORMAL LOW (ref 3.87–5.11)
RDW: 13.5 % (ref 11.5–15.5)
WBC: 28.3 10*3/uL — ABNORMAL HIGH (ref 4.0–10.5)

## 2016-06-20 LAB — PREPARE RBC (CROSSMATCH)

## 2016-06-20 LAB — SAVE SMEAR

## 2016-06-20 LAB — HEMOGLOBIN AND HEMATOCRIT, BLOOD
HCT: 27.9 % — ABNORMAL LOW (ref 36.0–46.0)
Hemoglobin: 9.5 g/dL — ABNORMAL LOW (ref 12.0–15.0)

## 2016-06-20 LAB — ABO/RH: ABO/RH(D): A POS

## 2016-06-20 LAB — LACTATE DEHYDROGENASE: LDH: 163 U/L (ref 98–192)

## 2016-06-20 MED ORDER — POLYETHYLENE GLYCOL 3350 17 G PO PACK
17.0000 g | PACK | Freq: Two times a day (BID) | ORAL | Status: DC
  Administered 2016-06-22 – 2016-07-03 (×10): 17 g via ORAL
  Filled 2016-06-20 (×19): qty 1

## 2016-06-20 MED ORDER — ALPRAZOLAM 0.25 MG PO TABS
0.2500 mg | ORAL_TABLET | Freq: Once | ORAL | Status: AC
  Administered 2016-06-20: 0.25 mg via ORAL
  Filled 2016-06-20: qty 1

## 2016-06-20 MED ORDER — SODIUM CHLORIDE 0.9 % IV BOLUS (SEPSIS)
500.0000 mL | Freq: Once | INTRAVENOUS | Status: AC
  Administered 2016-06-20: 500 mL via INTRAVENOUS

## 2016-06-20 MED ORDER — HYDROXYZINE HCL 25 MG PO TABS: 25.0000 mg | ORAL_TABLET | Freq: Once | ORAL | Status: DC

## 2016-06-20 MED ORDER — SODIUM CHLORIDE 0.9 % IV SOLN: Freq: Once | INTRAVENOUS | Status: DC

## 2016-06-20 MED ORDER — DIPHENHYDRAMINE HCL 25 MG PO CAPS
25.0000 mg | ORAL_CAPSULE | Freq: Once | ORAL | Status: AC
  Administered 2016-06-20: 25 mg via ORAL
  Filled 2016-06-20: qty 1

## 2016-06-20 NOTE — Consult Note (Signed)
New Hematology/Oncology Consult   Referral MD: Velvet Bathe     Reason for Referral: Anemia    HPI: [name] was admitted on 06/19/2016 with fever. The admission CBC was remarkable for a white count of 19.4, heme of 9.6, MCV 96.3, and platelets 379,000. The chemistry panel returned with a creatinine of 0.72, alk phosphatase 190, albumin 3.3, AST 148, ALT 216, and bilirubin 7.  She reports a history of chronic anemia and a serum "M "protein. She has been evaluated by a hematologist in [place]. She reports no known history of cold agglutinin disease.  She reports not feeling well for the past week. She had malaise and nausea. She was seen in the emergency room after a syncope event on 06/16/2016. She returned to the emergency room 06/19/2016 with nausea, anorexia, and fever. Her family had noted jaundice. She was admitted for further evaluation.       Past Medical History  Diagnosis Date  . Hypertension   . Pneumonia/H1 N1   December 2014   . Bladder infection-recurrent urinary tract infections    . Thoracic vertebral fracture ([place])     T 11, unsure type of fracture  : .  G4 P4    .  Raynaud's disease    .  History of a serum M protein   Past Surgical History  Procedure Laterality Date  . Appendectomy    . Fracture surgery    :   Current facility-administered medications:  .  0.9 %  sodium chloride infusion, , Intravenous, Continuous, Belkys A Regalado, MD, Last Rate: 100 mL/hr at 06/19/16 1736 .  0.9 %  sodium chloride infusion, , Intravenous, Once, Belkys A Regalado, MD .  0.9 %  sodium chloride infusion, , Intravenous, Once, Lily Kocher, MD .  acetaminophen (TYLENOL) tablet 650 mg, 650 mg, Oral, Q6H PRN, 650 mg at 06/20/16 1016 **OR** acetaminophen (TYLENOL) suppository 650 mg, 650 mg, Rectal, Q6H PRN, Belkys A Regalado, MD .  antiseptic oral rinse (CPC / CETYLPYRIDINIUM CHLORIDE 0.05%) solution 7 mL, 7 mL, Mouth Rinse, BID, Belkys A Regalado, MD, 7 mL at 06/19/16  2200 .  benzonatate (TESSALON) capsule 100 mg, 100 mg, Oral, TID PRN, Elmarie Shiley, MD .  Derrill Memo ON 06/21/2016] conjugated estrogens (PREMARIN) vaginal cream 1 Applicatorful, 1 Applicatorful, Vaginal, Once per day on Mon Wed Fri, Belkys A Regalado, MD .  diphenhydrAMINE (BENADRYL) capsule 25 mg, 25 mg, Oral, QHS PRN, Belkys A Regalado, MD, 25 mg at 06/19/16 2121 .  ibuprofen (ADVIL,MOTRIN) tablet 400 mg, 400 mg, Oral, Q6H PRN, Belkys A Regalado, MD .  ketorolac (TORADOL) 15 MG/ML injection 7.5 mg, 7.5 mg, Intravenous, Q8H PRN, Belkys A Regalado, MD, 7.5 mg at 06/20/16 0507 .  metoprolol succinate (TOPROL-XL) 24 hr tablet 25 mg, 25 mg, Oral, Daily, Belkys A Regalado, MD, 25 mg at 06/20/16 1000 .  multivitamin with minerals tablet 1 tablet, 1 tablet, Oral, Daily, Belkys A Regalado, MD, 1 tablet at 06/20/16 1016 .  ondansetron (ZOFRAN) tablet 4 mg, 4 mg, Oral, Q6H PRN **OR** ondansetron (ZOFRAN) injection 4 mg, 4 mg, Intravenous, Q6H PRN, Belkys A Regalado, MD, 4 mg at 06/19/16 1839 .  ondansetron (ZOFRAN) tablet 8 mg, 8 mg, Oral, Q8H PRN, Belkys A Regalado, MD .  piperacillin-tazobactam (ZOSYN) IVPB 3.375 g, 3.375 g, Intravenous, Q8H, Carmin Muskrat, MD, 3.375 g at 06/20/16 0544 .  polyethylene glycol (MIRALAX / GLYCOLAX) packet 17 g, 17 g, Oral, BID, Amy S Esterwood, PA-C .  sodium chloride  flush (NS) 0.9 % injection 3 mL, 3 mL, Intravenous, Q12H, Belkys A Regalado, MD, 3 mL at 06/20/16 1000 .  vancomycin (VANCOCIN) 500 mg in sodium chloride 0.9 % 100 mL IVPB, 500 mg, Intravenous, Q24H, Carmin Muskrat, MD:  . sodium chloride   Intravenous Once  . sodium chloride   Intravenous Once  . antiseptic oral rinse  7 mL Mouth Rinse BID  . [START ON 06/21/2016] conjugated estrogens  1 Applicatorful Vaginal Once per day on Mon Wed Fri  . metoprolol succinate  25 mg Oral Daily  . multivitamin with minerals  1 tablet Oral Daily  . piperacillin-tazobactam (ZOSYN)  IV  3.375 g Intravenous Q8H  .  polyethylene glycol  17 g Oral BID  . sodium chloride flush  3 mL Intravenous Q12H  . vancomycin  500 mg Intravenous Q24H  :  Allergies  Allergen Reactions  . Other Nausea And Vomiting    *All pain medications*  . Compazine [Prochlorperazine] Other (See Comments)    Restless, hallucinations  . Erythromycin     "passed out"  . Lyrica [Pregabalin] Other (See Comments)    Sleep walking  . Promethazine Other (See Comments)    Restless, hallucinations  . Tramadol Nausea And Vomiting  :  Family History  Problem Relation Age of Onset  . Heart disease Mother   . Heart disease Father   . Hypertension Sister   . Stroke Sister   . Cancer Sister   :  Social History   Social History  . Marital Status: Widowed    Spouse Name: N/A  . Number of Children: N/A  . Years of Education: N/A   Occupational History    retired from a research occupation with a [employer]  . Smoking status: Never Smoker   . Smokeless tobacco: Never Used  . Alcohol Use: Yes     Comment: 1 glass wine per day  . Drug Use: No  . Sexual Activity: Not on file    Review of Systems:  Positives include:Nausea, anorexia, malaise for the past week. Jaundice noted on day of admission, chronic numbness in the feet, history of Raynaud's phenomena  A complete ROS was otherwise negative.   Physical Exam:  Blood pressure 110/46, pulse 106, temperature 98.7 F (37.1 C), temperature source Oral, resp. rate 26, height 5' 3.5" (1.613 m), weight 115 lb (52.164 kg), SpO2 98 %.  HEENT: Scleral icterus, oral cavity without thrush or bleeding, neck without mass  Lungs: Decreased breath sounds at the lower chest, no respiratory distress  Cardiac: Regular rate and rhythm  Abdomen: No hepatosplenomegaly   Vascular: No leg edema  Lymph nodes: No cervical, supraclavicular, axillary, or inguinal nodes  Neurologic: Alert and oriented, the motor exam appears intact in the upper and  lower extremities  Skin: Jaundice, no rash   LABS:   Recent Labs  06/19/16 1256 06/20/16 0231  WBC 40.0* 28.3*  HGB 7.2* 5.6*  HCT 20.3* 15.4*  PLT 456* 333     Recent Labs  06/19/16 1854 06/20/16 0231  NA 125* 127*  K 4.8 3.8  CL 96* 96*  CO2 20* 25  GLUCOSE 115* 113*  BUN 18 20  CREATININE 0.56 0.75  CALCIUM 8.2* 7.7*    DAT-negative, antibody screen positive Reticulocyte count 92, 4.2%  PT 16.4 seconds  LDH 163  Smear- rbcs are clumped, polychromasia not increased, increased wbc's with numerous polys, few bands and myelocytes, platelets are increased  RADIOLOGY:  Dg Chest 2 View  06/19/2016  CLINICAL DATA:  Fever EXAM: CHEST  2 VIEW COMPARISON:  06/16/2016 chest radiograph. FINDINGS: Stable cardiomediastinal silhouette with mild cardiomegaly. No pneumothorax. No pleural effusion. No overt pulmonary edema. New mild patchy opacity at the right lung base. Interval stability of moderate to severe lower thoracic vertebral compression fracture. IMPRESSION: 1. New mild patchy right lung base opacity, cannot exclude aspiration or pneumonia. Recommend follow-up PA and lateral post treatment chest radiographs in 4-6 weeks. 2. Stable mild cardiomegaly without overt pulmonary edema. Electronically Signed   By: Ilona Sorrel M.D.   On: 06/19/2016 13:57   Dg Chest 2 View  06/16/2016  CLINICAL DATA:  Lightheadedness EXAM: CHEST  2 VIEW COMPARISON:  12/10/2013 FINDINGS: Mild cardiac enlargement. Aortic atherosclerosis. There is no pleural effusion or edema. There are coarsened interstitial markings identified bilaterally. No airspace consolidation. There is a compression fracture within the lower thoracic spine, new from previous exam. IMPRESSION: 1. Mild cardiac enlargement and aortic atherosclerosis. 2. Age-indeterminate lower thoracic spine compression fracture. New from 12/10/2013. Electronically Signed   By: Kerby Moors M.D.   On: 06/16/2016 11:14   Dg Pelvis 1-2  Views  06/16/2016  CLINICAL DATA:  Evaluate for pelvis fracture after fall. Initial encounter. EXAM: PELVIS - 1-2 VIEW COMPARISON:  None. FINDINGS: No evidence of pelvic ring fracture or diastasis. Previous sacral insufficiency fractures without current visualization. Foreshortened appearance of the right femoral neck attributed to rotation. No definitive hip fracture. No dislocation. Osteopenia. IMPRESSION: No acute finding.  If hip pain recommend dedicated hip series. Electronically Signed   By: Monte Fantasia M.D.   On: 06/16/2016 11:06   Ct Head Wo Contrast  06/16/2016  CLINICAL DATA:  Fall. EXAM: CT HEAD WITHOUT CONTRAST TECHNIQUE: Contiguous axial images were obtained from the base of the skull through the vertex without intravenous contrast. COMPARISON:  None. FINDINGS: Brain: There is prominence of the sulci and ventricles. Mild low attenuation within the periventricular and subcortical white matter identified compatible with chronic microvascular disease. No evidence of acute infarction, hemorrhage, extra-axial collection, ventriculomegaly, or mass effect. Vascular: No hyperdense vessel or unexpected calcification. Skull: Negative for fracture or focal lesion. Sinuses/Orbits: No acute findings. Other: None. IMPRESSION: 1. Mild brain atrophy and chronic microvascular disease. 2. No acute findings. Electronically Signed   By: Kerby Moors M.D.   On: 06/16/2016 11:20   US Abdomen Complete  06/19/2016  CLINICAL DATA:  Hyperbilirubinemia, history hypertension EXAM: ABDOMEN ULTRASOUND COMPLETE COMPARISON:  None FINDINGS: Gallbladder: Normally distended. Few tiny dependent calculi at gallbladder neck. No gallbladder wall thickening pericholecystic fluid. Non shadowing non mobile echogenic focus at mid gallbladder most consistent with polyp 6 mm diameter. No sonographic Murphy sign. Common bile duct: Diameter: Question leaned visualized, question 2 mm diameter Liver: Normal echogenicity. Mass lesion  centrally RIGHT lobe 3.6 x 3.6 x 3.6 cm, complex hypoechoic with few areas partial septation. No internal blood flow on color Doppler imaging within this lesion. A second lesion posteriorly RIGHT lobe liver, 5.1 x 4.1 x 5.0 cm, similar characteristics. These require further assessment by CT or MR. No additional focal hepatic abnormalities. Hepatopetal portal venous flow. IVC: Normal appearance Pancreas: Normal parenchymal echogenicity. Small cystic lesion at pancreatic tail 10 x 8 x 9 mm with an additional questionable tiny cystic lesion at pancreatic tail 9 x 5 x 8 mm. Spleen: Normal appearance, 5.8 cm length Right Kidney: Length: 9.1 cm. Mild collecting system dilatation/hydronephrosis. No mass or calcification. Left Kidney: Length:  10.9 cm. Suboptimal visualization to body habitus. No gross evidence of mass or hydronephrosis. Abdominal aorta: Normal caliber with scattered atherosclerotic change. Other findings: No free fluid IMPRESSION: 2 hepatic masses are identified measuring 3.6cm nad 5.1 cm in greatest sizes, both complex predominately hypoechoic without internal blood flow, uncertain etiology; further characterization of these lesions by MR or CT imaging with and without contrast recommended. Tiny nonspecific cystic lesions at pancreatic tail. Cholelithiasis with additional 6 mm gallbladder polyp, likely benign based on size, no further workup required; This recommendation follows ACR consensus guidelines: White Paper of the ACR Incidental Findings Committee II on Gallbladder and Biliary Findings. J Am Coll Radiol 2013:;10:953-956. Electronically Signed   By: Lavonia Dana M.D.   On: 06/19/2016 15:15   Ct Abdomen Pelvis W Contrast  06/19/2016  CLINICAL DATA:  Worsening upper abdominal pain and fever. EXAM: CT ABDOMEN AND PELVIS WITH CONTRAST TECHNIQUE: Multidetector CT imaging of the abdomen and pelvis was performed using the standard protocol following bolus administration of intravenous contrast. CONTRAST:   72m ISOVUE-300 IOPAMIDOL (ISOVUE-300) INJECTION 61% COMPARISON:  CT scan of the chest December 10, 2013 FINDINGS: Small bilateral pleural effusions are identified. There is mild atelectasis in the left base. Streaky opacity posteriorly on the right is likely atelectasis as well. No infiltrate suspicious for pneumonia. No nodule or mass. The lung bases are otherwise unremarkable. No free air identified. There is a small amount of free fluid in the pelvis which is nonspecific. There are 2 masses in the right hepatic lobe with the first seen on series 3, image 25 measuring 4.7 by 4.1 cm and the other seen on image 28, a little smaller. There is no definitive peripheral nodular enhancement associated with either of these masses, including on delayed images. The masses demonstrate attenuations between 10 and 20 Hounsfield units. These masses are new when compared to the December 10, 2013 study. A few tiny foci of low attenuation in the liver likely represent tiny cysts. Periportal edema is identified. The portal vein is patent. A rounded region of low attenuation is seen in the pancreatic tail on series 3, image 33 measuring 7.5 mm and too small to characterize. Another smaller similar region is seen on image 33 in the body of the pancreas. No other pancreatic masses. The spleen and adrenal glands are normal. 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 There is a probable cyst in the right kidney on image 51, too small to characterize. No solid masses are seen in the right kidney. Low-attenuation in the left kidney on series 3, image 32 is too small to characterize as well. A larger rounded region of low attenuation in the left kidney on image 34 measures up to 17 mm is not definitively cystic on this study with an attenuation of 28 Hounsfield units. No other suspicious abnormality seen in either kidney. There is atherosclerotic change in the tortuous abdominal aorta with no aneurysm or dissection. No  adenopathy. The cholelithiasis and probable polyp in the gallbladder seen on recent ultrasound are not seen on the CT scan. The gallbladder is unremarkable. There is a small hiatal hernia. The stomach is decompressed limiting evaluation but no gastric abnormalities are seen. The stomach and small bowel are unremarkable. There is fecal loading throughout the colon. A region of relative narrowing in the sigmoid colon is thought to be due to peristalsis with no identified colonic mass. The patient is status post appendectomy. No adenopathy is seen in the pelvis. The uterus and adnexae  are unremarkable. The bladder is normal. Degenerative changes are seen in the spine. There is anterior wedging of T11 which is new compared to 2014 with approximately 70% loss of height. There is mild anterior wedging of L1 which is age indeterminate but may be chronic. No other bony abnormalities. IMPRESSION: 1. There are 2 low-attenuation masses in the right hepatic lobe new since December 2014. These do not meet the criteria for hemangiomas. The low-attenuation would suggest cystic components. Recommended MRI for further characterisation. Cystic neoplasms or abscesses are not excluded on this study. 2. Indeterminate rounded low-attenuation measuring 17 mm in the left kidney. This could represent a hyperdense cyst or a solid neoplasm based on this CT scan. Recommend attention to this region on the MRI to exclude an underlying solid neoplasm. Other smaller regions of low attenuation in the kidneys are probably cystic. 3. 2 small regions of low attenuation in the pancreatic body and tail are nonspecific. Recommend attention to these regions on the recommended MRI. These are of low suspicion. 4. Age indeterminate compression fractures of T11 and L1. Recommend clinical correlation. 5. There is a small amount of fluid in the pelvis which is nonspecific. 6. Mild periportal edema, nonspecific. 7. Small bilateral effusions with associated  atelectasis. 8. Moderate to severe fecal loading throughout the colon. Electronically Signed   By: Dorise Bullion III M.D   On: 06/19/2016 21:37    Assessment and Plan:   1. Fever 2. Abnormal liver enzymes, hypodense liver lesions noted on CT 06/19/2016 3. Anemia 4. Nonspecific cold agglutinin noted on a blood bank and a measuring  Ms. Jha is admitted with a febrile illness. She may have liver abscesses. There is a marked leukocytosis and reactive appearing peripheral blood smear consistent with an acute infection.  She is anemic. Review of the clinical chart is consistent with a history of anemia. 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 The anemia has likely been exacerbated in the setting of an acute infection. There is no apparent underlying lymphoproliferative disorder, but this is commonly found in her age group.  Recommendations: 1. Transfuse packed red blood cells for acute management of the anemia 2. Check serum cold agglutinin titer, serum protein electrophoresis, serum immunofixation, and serum free light chains 3. Workup of the "liver lesions "and treatment of infection for internal medicine and GI 4. Hematology will continue following her in the hospital     Betsy Coder, MD 06/20/2016, 12:17 PM

## 2016-06-20 NOTE — Consult Note (Signed)
Consultation  Referring Provider: [facility] hospitalist/ Regalado Primary Care Physician:  Tawanna Solo, MD Primary Gastroenterologist:  None/unassigned.  Reason for Consultation:   Liver masses  HPI: Brianna [name] is a 82 y.o. female  who was admitted last evening from home, brought in by her family complaining of feeling poorly over the past 3 days, with decreased oral intake and fever to 103 per EMS Her daughter had noticed that she appeared jaundiced within the past 24 hours. Pt  actually had an episode on Wednesday afternoon, stating that she was in her kitchen and all of a sudden felt very poorly, sat down in a chair but then actually passed out briefly. She was then evaluated in the emergency room, had scan of her head etc. and no worrisome findings. She was noted to have a WBC of 19,000, but with no other symptoms was allowed discharged home.  On presentation WBC was 40,000 hemoglobin 7.2 hematocrit of 20 platelets 333, pro time 16.1/INR 1.3 Total bili is now 4.2 alkaline phosphatase 135 AST 81 and ALT  148 CT of the abdomen and pelvis done on admission showed small bilateral pleural effusions, small amount of free fluid in the pelvis, and 2 masses in the right hepatic lobe the largest measuring 4.7 x 4.1 cm. These are not felt to have characteristics of hemangiomas, there is associated mild periportal edema. Cannot rule out cystic neoplasms or abscesses Blood cultures have been done, she is being covered with vancomycin and Zosyn.  Patient feels a little bit better today, denies any nausea or abdominal pain. 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 She has a tendency to hemolyze when she is ill, and has a chronic anemia. Patient has not had any prior GI issues though she has had screening colonoscopies etc. when she lived in [place]. Patient had just moved to [facility] in October 2016 and is actually planning  on relocating again to [place].    Past Medical History  Diagnosis Date  . Hypertension   . Pneumonia   . Bladder infection   . Thoracic vertebral fracture ([place])     T 11, unsure type of fracture    Past Surgical History  Procedure Laterality Date  . Appendectomy    . Fracture surgery      Prior to Admission medications   Medication Sig Start Date End Date Taking? Authorizing Provider  acetaminophen (TYLENOL) 500 MG tablet Take 1,000 mg by mouth every 6 (six) hours as needed for mild pain, moderate pain, fever or headache.   Yes Historical Provider, MD  conjugated estrogens (PREMARIN) vaginal cream Place 1 Applicatorful vaginally 3 (three) times a week.   Yes Historical Provider, MD  diphenhydrAMINE (BENADRYL) 25 MG tablet Take 25 mg by mouth at bedtime as needed for sleep.    Yes Historical Provider, MD  famotidine (PEPCID) 10 MG tablet Take 10 mg by mouth daily as needed for heartburn or indigestion.   Yes Historical Provider, MD  ibuprofen (ADVIL,MOTRIN) 200 MG tablet Take 400 mg by mouth every 6 (six) hours as needed for fever, headache, mild pain, moderate pain or cramping.   Yes Historical Provider, MD  Melatonin 5 MG TABS Take 5 mg by mouth at bedtime as needed (sleep).   Yes Historical Provider, MD  metoprolol succinate (TOPROL-XL) 25 MG 24 hr tablet Take 1 tablet (25 mg total) by mouth daily. 12/13/13  Yes Robbie Lis,  MD  Multiple Vitamin (MULTIVITAMIN WITH MINERALS) TABS tablet Take 1 tablet by mouth daily.   Yes Historical Provider, MD  ondansetron (ZOFRAN) 8 MG tablet Take 8 mg by mouth every 8 (eight) hours as needed for nausea or vomiting.   Yes Historical Provider, MD  Polyethyl Glycol-Propyl Glycol (SYSTANE OP) Apply 2 drops to eye 3 (three) times daily as needed (dry eyes).   Yes Historical Provider, MD  ibuprofen (ADVIL,MOTRIN) 800 MG tablet Take 0.5 tablets (400 mg total) by mouth 3 (three) times daily. Patient not taking: Reported on 06/19/2016 06/16/16   [name], MD    Current Facility-Administered Medications  Medication Dose Route Frequency Provider Last Rate Last Dose  . 0.9 %  sodium chloride infusion   Intravenous Continuous Belkys A Regalado, MD 100 mL/hr at 06/19/16 1736    . 0.9 %  sodium chloride infusion   Intravenous Once Belkys A Regalado, MD      . 0.9 %  sodium chloride infusion   Intravenous Once Lily Kocher, MD      . acetaminophen (TYLENOL) tablet 650 mg  650 mg Oral Q6H PRN Belkys A Regalado, MD   650 mg at 06/20/16 1016   Or  . acetaminophen (TYLENOL) suppository 650 mg  650 mg Rectal Q6H PRN Belkys A Regalado, MD      . antiseptic oral rinse (CPC / CETYLPYRIDINIUM CHLORIDE 0.05%) solution 7 mL  7 mL Mouth Rinse BID Belkys A Regalado, MD   7 mL at 06/19/16 2200  . benzonatate (TESSALON) capsule 100 mg  100 mg Oral TID PRN Belkys A Regalado, MD      . Derrill Memo ON 06/21/2016] conjugated estrogens (PREMARIN) vaginal cream 1 Applicatorful  1 Applicatorful Vaginal Once per day on Mon Wed Fri Belkys A Regalado, MD      . diphenhydrAMINE (BENADRYL) capsule 25 mg  25 mg Oral QHS PRN Belkys A Regalado, MD   25 mg at 06/19/16 2121  . ibuprofen (ADVIL,MOTRIN) tablet 400 mg  400 mg Oral Q6H PRN Belkys A Regalado, MD      . ketorolac (TORADOL) 15 MG/ML injection 7.5 mg  7.5 mg Intravenous Q8H PRN Belkys A Regalado, MD   7.5 mg at 06/20/16 0507  . metoprolol succinate (TOPROL-XL) 24 hr tablet 25 mg  25 mg Oral Daily Belkys A Regalado, MD   25 mg at 06/20/16 1000  . multivitamin with minerals tablet 1 tablet  1 tablet Oral Daily Belkys A Regalado, MD   1 tablet at 06/20/16 1016  . ondansetron (ZOFRAN) tablet 4 mg  4 mg Oral Q6H PRN Belkys A Regalado, MD       Or  . ondansetron (ZOFRAN) injection 4 mg  4 mg Intravenous Q6H PRN Belkys A Regalado, MD   4 mg at 06/19/16 1839  . ondansetron (ZOFRAN) tablet 8 mg  8 mg Oral Q8H PRN Belkys A Regalado, MD      . piperacillin-tazobactam (ZOSYN) IVPB 3.375 g  3.375 g Intravenous Q8H Carmin Muskrat, MD    3.375 g at 06/20/16 0544  . sodium chloride flush (NS) 0.9 % injection 3 mL  3 mL Intravenous Q12H Belkys A Regalado, MD   3 mL at 06/20/16 1000  . vancomycin (VANCOCIN) 500 mg in sodium chloride 0.9 % 100 mL IVPB  500 mg Intravenous Q24H Carmin Muskrat, MD        Allergies as of 06/19/2016 - Review Complete 06/19/2016  Allergen Reaction Noted  . Other Nausea And Vomiting 06/19/2016  .  Compazine [prochlorperazine] Other (See Comments) 10/30/2015  . Erythromycin  12/10/2013  . Lyrica [pregabalin] Other (See Comments) 10/30/2015  . Promethazine Other (See Comments) 10/30/2015  . Tramadol Nausea And Vomiting 10/30/2015    Family History  Problem Relation Age of Onset  . Heart disease Mother   . Heart disease Father   . Hypertension Sister   . Stroke Sister   . Cancer Sister     Social History   Social History  . Marital Status: Widowed    Spouse Name: N/A  . Number of Children: N/A  . Years of Education: N/A   Occupational History  . Not on file.   Social History Main Topics  . Smoking status: Never Smoker   . Smokeless tobacco: Never Used  . Alcohol Use: Yes     Comment: 1 glass wine per day  . Drug Use: No  . Sexual Activity: Not on file   Other Topics Concern  . Not on file   Social History Narrative    Review of Systems: Pertinent positive and negative review of systems were noted in the above HPI section.  All other review of systems was otherwise negative. Physical Exam: Vital signs in last 24 hours: Temp:  [97.8 F (36.6 C)-102.9 F (39.4 C)] 98 F (36.7 C) (07/09 0716) Pulse Rate:  [82-145] 106 (07/09 1000) Resp:  [14-26] 26 (07/09 1000) BP: (92-177)/(28-82) 110/46 mmHg (07/09 1000) SpO2:  [90 %-100 %] 98 % (07/09 1000) Weight:  [115 lb (52.164 kg)] 115 lb (52.164 kg) (07/08 1255)   General:   Alert,  Well-developed,elderly ,jaundiced  well-nourished, pleasant and cooperative in NAD Head:  Normocephalic and atraumatic. Eyes:  Sclera icteric ,    Conjunctiva pale  Ears:  Normal auditory acuity. Nose:  No deformity, discharge,  or lesions. Mouth:  No deformity or lesions.   Neck:  Supple; no masses or thyromegaly. Lungs:  Clear throughout to auscultation.   No wheezes, crackles, or rhonchi. Heart:  Regular rate and rhythm; no murmurs, clicks, rubs,  or gallops. Abdomen:  Soft,nontender, BS active,nonpalp mass or hsm.,low midline incisional scar   Rectal:  Deferred  Msk:  Symmetrical without gross deformities. . Pulses:  Normal pulses noted. Extremities:  Without clubbing or edema. Neurologic:  Alert and  oriented x4;  grossly normal neurologically. Skin:  Intact without significant lesions or rashes./jaundiced . Psych:  Alert and cooperative. Normal mood and affect.  Intake/Output from previous day: 07/08 0701 - 07/09 0700 In: 1170 [I.V.:1040; Blood:30; IV Piggyback:100] Out: 825 [Urine:825] Intake/Output this shift: Total I/O In: 620 [I.V.:300; Blood:320] Out: 150 [Urine:150]  Lab Results:  Recent Labs  06/19/16 1256 06/20/16 0231  WBC 40.0* 28.3*  HGB 7.2* 5.6*  HCT 20.3* 15.4*  PLT 456* 333   BMET  Recent Labs  06/19/16 1256 06/19/16 1854 06/20/16 0231  NA 125* 125* 127*  K 5.7* 4.8 3.8  CL 89* 96* 96*  CO2 28 20* 25  GLUCOSE 138* 115* 113*  BUN 22* 18 20  CREATININE 0.72 0.56 0.75  CALCIUM 9.3 8.2* 7.7*   LFT  Recent Labs  06/19/16 1655 06/20/16 0231  PROT  --  5.2*  ALBUMIN  --  2.4*  AST  --  81*  ALT  --  148*  ALKPHOS  --  135*  BILITOT 6.0* 4.2*  BILIDIR 2.0*  --   IBILI 4.0*  --    PT/INR  Recent Labs  06/20/16 0231  LABPROT 16.4*  INR 1.31  IMPRESSION:   #28 82 year old female with 2 day history of acute illness with general malaise, nausea, development of jaundice, fever to 103, and WBC of 40,000. CT imaging reveals 2 right hepatic lobe "masses" with some fluid attenuation-rule out cystic neoplasms or possible hepatic abscesses Patient's presentation is most concerning  for hepatic abscesses #2 cold agglutinin disease #3 anemia with drop in hemoglobin since admission-consistent with hemolysis #4  Jaundice-likely secondary to hemolysis #5 hypertension #6 history of vertebral fractures #7Constipation  PLAN: Continue  IV Zosyn and IV vancomycin Await blood cultures Transfusions to keep hemoglobin in 7 range Have consult IR for management and will defer to IR guarding indication for MR of the liver versus proceeding directly to IR aspiration and possible drainage of the hepatic lesions. Plans discussed in detail with the patient and her daughter. Start MiraLAX 17 g in 8 ounces of water twice daily for obstipation Thank you- Will follow with you   Amy Esterwood  06/20/2016, 11:19 AM  GI ATTENDING  History, laboratories, x-rays personally reviewed. Patient personally seen and examined. Family members in room. Agree with comprehensive consultation note as outlined above. Patient presents with jaundice and fevers. Imaging reveals right liver abnormalities worrisome for abscess. History of cold agglutinin disease has led to interval anemia and elevated indirect bilirubin from hemolysis. We have consulted with interventional radiology regarding evaluation of the hepatic lesions and percutaneous drainage if abscesses confirmed. As well, if abscess confirmed, would consult infectious diseases. GI will follow until these issues are sorted out. Discussed with patient and her family. Thank you.  Docia Chuck. Geri Seminole., M.D. [facility] Division of Gastroenterology

## 2016-06-20 NOTE — Progress Notes (Signed)
PROGRESS NOTE   Brianna [name]  [id number] DOB: 10-25-1924 DOA: 06/19/2016 PCP: Tawanna Solo, MD    Brief Narrative:  Pt is an 82 year old with history of cold agglutinin and reportedly scarred lung tissue secondary to pneumonia per patient. Presenting with leukocytosis, sepsis, profound anemia. Patient status post transfusion. GI consulted for CT scan results.   Assessment & Plan:    Sepsis ([place]) -Most likely secondary to urinary tract infection. Reviewed imaging studies and patient has had the image results reported on prior x-rays including x-ray as far back as 2014. Also patient is not complaining of increase in cough or sputum production which goes against active infection in her lungs.   Liver masses - GI on board and will evaluate patient. Most likely will obtain MRI to further assess. Will await for GI to evaluate patient as they may want to do other testing/imaging studies  Profound anemia - Pt with history of cold agglutinin  - I suspect bone marrow suppression secondary to active infection - discussed with hematologist  Active Problems:   Spinal stenosis of lumbar region   Liver failure ([place]) - GI consulted    Hyponatremia - Most likely due to poor oral solute intake. Improved with IV fluids. - Reassess this afternoon    Hyperkalemia - Potassium within normal limits currently  DVT prophylaxis: SCD's Code Status: DNR Family Communication: d/c daughter at bedside.  Disposition Plan: Pending improvement in condition   Consultants:   GI  Hematology   Procedures: None   Antimicrobials: Pip Tazo, vancomycin   Subjective: Pt has no new complaints. Daughter had many question regarding CT scan and current medical condition  Objective: Filed Vitals:   06/20/16 0716 06/20/16 0800 06/20/16 0900 06/20/16 1000  BP: 120/50 128/50 125/47 110/46  Pulse: 95 101 104 106  Temp: 98 F (36.7 C)     TempSrc: Oral     Resp: 25 26 24 26   Height:        Weight:      SpO2: 98% 97% 99% 98%    Intake/Output Summary (Last 24 hours) at 06/20/16 1125 Last data filed at 06/20/16 1000  Gross per 24 hour  Intake   1790 ml  Output    975 ml  Net    815 ml   Filed Weights   06/19/16 1255  Weight: 52.164 kg (115 lb)    Examination:  General exam: Appears calm and comfortable  Respiratory system: Clear to auscultation. Respiratory effort normal. Cardiovascular system: S1 & S2 heard, RRR. No JVD, murmurs, rubs, gallops or clicks. . Gastrointestinal system: Abdomen is nondistended, soft and nontender. No organomegaly or masses felt. Normal bowel sounds heard. Central nervous system: Alert and oriented. No focal neurological deficits. Extremities: Symmetric 5 x 5 power. Skin: No rashes, lesions or ulcers on limited exam. Psychiatry: Judgement and insight appear normal. Mood & affect appropriate.     Data Reviewed: I have personally reviewed following labs and imaging studies  CBC:  Recent Labs Lab 06/16/16 0958 06/19/16 1256 06/20/16 0231  WBC 19.4* 40.0* 28.3*  NEUTROABS  --  37.4*  --   HGB 9.6* 7.2* 5.6*  HCT 28.8* 20.3* 15.4*  MCV 96.3 92.7 91.7  PLT 379 456* 0000000   Basic Metabolic Panel:  Recent Labs Lab 06/16/16 0958 06/19/16 1256 06/19/16 1854 06/20/16 0231  NA 133* 125* 125* 127*  K 4.6 5.7* 4.8 3.8  CL 100* 89* 96* 96*  CO2 28 28 20* 25  GLUCOSE 123* 138* 115* 113*  BUN 14 22* 18 20  CREATININE 0.64 0.72 0.56 0.75  CALCIUM 8.8* 9.3 8.2* 7.7*   GFR: Estimated Creatinine Clearance: 38.5 mL/min (by C-G formula based on Cr of 0.75). Liver Function Tests:  Recent Labs Lab 06/19/16 1256 06/19/16 1655 06/20/16 0231  AST 148*  --  81*  ALT 216*  --  148*  ALKPHOS 190*  --  135*  BILITOT 7.0* 6.0* 4.2*  PROT 6.8  --  5.2*  ALBUMIN 3.3*  --  2.4*   No results for input(s): LIPASE, AMYLASE in the last 168 hours. No results for input(s): AMMONIA in the last 168 hours. Coagulation Profile:  Recent  Labs Lab 06/20/16 0231  INR 1.31   Cardiac Enzymes: No results for input(s): CKTOTAL, CKMB, CKMBINDEX, TROPONINI in the last 168 hours. BNP (last 3 results) No results for input(s): PROBNP in the last 8760 hours. HbA1C: No results for input(s): HGBA1C in the last 72 hours. CBG: No results for input(s): GLUCAP in the last 168 hours. Lipid Profile: No results for input(s): CHOL, HDL, LDLCALC, TRIG, CHOLHDL, LDLDIRECT in the last 72 hours. Thyroid Function Tests: No results for input(s): TSH, T4TOTAL, FREET4, T3FREE, THYROIDAB in the last 72 hours. Anemia Panel:  Recent Labs  06/19/16 1854  RETICCTPCT 4.2*   Sepsis Labs:  Recent Labs Lab 06/19/16 1308 06/19/16 1656  LATICACIDVEN 1.56 0.68    Recent Results (from the past 240 hour(s))  MRSA PCR Screening     Status: None   Collection Time: 06/19/16  5:18 PM  Result Value Ref Range Status   MRSA by PCR NEGATIVE NEGATIVE Final    Comment:        The GeneXpert MRSA Assay (FDA approved for NASAL specimens only), is one component of a comprehensive MRSA colonization surveillance program. It is not intended to diagnose MRSA infection nor to guide or monitor treatment for MRSA infections.          Radiology Studies: Dg Chest 2 View  06/19/2016  CLINICAL DATA:  Fever EXAM: CHEST  2 VIEW COMPARISON:  06/16/2016 chest radiograph. FINDINGS: Stable cardiomediastinal silhouette with mild cardiomegaly. No pneumothorax. No pleural effusion. No overt pulmonary edema. New mild patchy opacity at the right lung base. Interval stability of moderate to severe lower thoracic vertebral compression fracture. IMPRESSION: 1. New mild patchy right lung base opacity, cannot exclude aspiration or pneumonia. Recommend follow-up PA and lateral post treatment chest radiographs in 4-6 weeks. 2. Stable mild cardiomegaly without overt pulmonary edema. Electronically Signed   By: Ilona Sorrel M.D.   On: 06/19/2016 13:57   US Abdomen  Complete  06/19/2016  CLINICAL DATA:  Hyperbilirubinemia, history hypertension EXAM: ABDOMEN ULTRASOUND COMPLETE COMPARISON:  None FINDINGS: Gallbladder: Normally distended. Few tiny dependent calculi at gallbladder neck. No gallbladder wall thickening pericholecystic fluid. Non shadowing non mobile echogenic focus at mid gallbladder most consistent with polyp 6 mm diameter. No sonographic Murphy sign. Common bile duct: Diameter: Question leaned visualized, question 2 mm diameter Liver: Normal echogenicity. Mass lesion centrally RIGHT lobe 3.6 x 3.6 x 3.6 cm, complex hypoechoic with few areas partial septation. No internal blood flow on color Doppler imaging within this lesion. A second lesion posteriorly RIGHT lobe liver, 5.1 x 4.1 x 5.0 cm, similar characteristics. These require further assessment by CT or MR. No additional focal hepatic abnormalities. Hepatopetal portal venous flow. IVC: Normal appearance Pancreas: Normal parenchymal echogenicity. Small cystic lesion at pancreatic tail 10 x 8 x 9 mm with an additional questionable tiny cystic  lesion at pancreatic tail 9 x 5 x 8 mm. Spleen: Normal appearance, 5.8 cm length Right Kidney: Length: 9.1 cm. Mild collecting system dilatation/hydronephrosis. No mass or calcification. Left Kidney: Length: 10.9 cm. Suboptimal visualization to body habitus. No gross evidence of mass or hydronephrosis. Abdominal aorta: Normal caliber with scattered atherosclerotic change. Other findings: No free fluid IMPRESSION: 2 hepatic masses are identified measuring 3.6cm nad 5.1 cm in greatest sizes, both complex predominately hypoechoic without internal blood flow, uncertain etiology; further characterization of these lesions by MR or CT imaging with and without contrast recommended. Tiny nonspecific cystic lesions at pancreatic tail. Cholelithiasis with additional 6 mm gallbladder polyp, likely benign based on size, no further workup required; This recommendation follows ACR  consensus guidelines: White Paper of the ACR Incidental Findings Committee II on Gallbladder and Biliary Findings. J Am Coll Radiol 2013:;10:953-956. Electronically Signed   By: Lavonia Dana M.D.   On: 06/19/2016 15:15   Ct Abdomen Pelvis W Contrast  06/19/2016  CLINICAL DATA:  Worsening upper abdominal pain and fever. EXAM: CT ABDOMEN AND PELVIS WITH CONTRAST TECHNIQUE: Multidetector CT imaging of the abdomen and pelvis was performed using the standard protocol following bolus administration of intravenous contrast. CONTRAST:  62mL ISOVUE-300 IOPAMIDOL (ISOVUE-300) INJECTION 61% COMPARISON:  CT scan of the chest December 10, 2013 FINDINGS: Small bilateral pleural effusions are identified. There is mild atelectasis in the left base. Streaky opacity posteriorly on the right is likely atelectasis as well. No infiltrate suspicious for pneumonia. No nodule or mass. The lung bases are otherwise unremarkable. No free air identified. There is a small amount of free fluid in the pelvis which is nonspecific. There are 2 masses in the right hepatic lobe with the first seen on series 3, image 25 measuring 4.7 by 4.1 cm and the other seen on image 28, a little smaller. There is no definitive peripheral nodular enhancement associated with either of these masses, including on delayed images. The masses demonstrate attenuations between 10 and 20 Hounsfield units. These masses are new when compared to the December 10, 2013 study. A few tiny foci of low attenuation in the liver likely represent tiny cysts. Periportal edema is identified. The portal vein is patent. A rounded region of low attenuation is seen in the pancreatic tail on series 3, image 33 measuring 7.5 mm and too small to characterize. Another smaller similar region is seen on image 33 in the body of the pancreas. No other pancreatic masses. The spleen and adrenal glands are normal. 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 There is a probable cyst in the  right kidney on image 51, too small to characterize. No solid masses are seen in the right kidney. Low-attenuation in the left kidney on series 3, image 32 is too small to characterize as well. A larger rounded region of low attenuation in the left kidney on image 34 measures up to 17 mm is not definitively cystic on this study with an attenuation of 28 Hounsfield units. No other suspicious abnormality seen in either kidney. There is atherosclerotic change in the tortuous abdominal aorta with no aneurysm or dissection. No adenopathy. The cholelithiasis and probable polyp in the gallbladder seen on recent ultrasound are not seen on the CT scan. The gallbladder is unremarkable. There is a small hiatal hernia. The stomach is decompressed limiting evaluation but no gastric abnormalities are seen. The stomach and small bowel are unremarkable. There is fecal loading throughout the colon. A region of relative narrowing in  the sigmoid colon is thought to be due to peristalsis with no identified colonic mass. The patient is status post appendectomy. No adenopathy is seen in the pelvis. The uterus and adnexae are unremarkable. The bladder is normal. Degenerative changes are seen in the spine. There is anterior wedging of T11 which is new compared to 2014 with approximately 70% loss of height. There is mild anterior wedging of L1 which is age indeterminate but may be chronic. No other bony abnormalities. IMPRESSION: 1. There are 2 low-attenuation masses in the right hepatic lobe new since December 2014. These do not meet the criteria for hemangiomas. The low-attenuation would suggest cystic components. Recommended MRI for further characterisation. Cystic neoplasms or abscesses are not excluded on this study. 2. Indeterminate rounded low-attenuation measuring 17 mm in the left kidney. This could represent a hyperdense cyst or a solid neoplasm based on this CT scan. Recommend attention to this region on the MRI to exclude an  underlying solid neoplasm. Other smaller regions of low attenuation in the kidneys are probably cystic. 3. 2 small regions of low attenuation in the pancreatic body and tail are nonspecific. Recommend attention to these regions on the recommended MRI. These are of low suspicion. 4. Age indeterminate compression fractures of T11 and L1. Recommend clinical correlation. 5. There is a small amount of fluid in the pelvis which is nonspecific. 6. Mild periportal edema, nonspecific. 7. Small bilateral effusions with associated atelectasis. 8. Moderate to severe fecal loading throughout the colon. Electronically Signed   By: Dorise Bullion III M.D   On: 06/19/2016 21:37        Scheduled Meds: . sodium chloride   Intravenous Once  . sodium chloride   Intravenous Once  . antiseptic oral rinse  7 mL Mouth Rinse BID  . [START ON 06/21/2016] conjugated estrogens  1 Applicatorful Vaginal Once per day on Mon Wed Fri  . metoprolol succinate  25 mg Oral Daily  . multivitamin with minerals  1 tablet Oral Daily  . piperacillin-tazobactam (ZOSYN)  IV  3.375 g Intravenous Q8H  . sodium chloride flush  3 mL Intravenous Q12H  . vancomycin  500 mg Intravenous Q24H   Continuous Infusions: . sodium chloride 100 mL/hr at 06/19/16 1736     LOS: 1 day   Time spent: > 35 minutes  Velvet Bathe, MD [facility] Pager [phone]  If 7PM-7AM, please contact night-coverage [email or web address] Password [email or web address] 06/20/2016, 11:25 AM

## 2016-06-20 NOTE — Progress Notes (Signed)
CRITICAL VALUE ALERT  Critical value received:  hbg 5.6  Date of notification:  06/20/16  Time of notification: 0300  Critical value read back:Yes.    Nurse who received alert:  Darrin Nipper, RN  MD notified (1st page):  [facility] on call  Time of first page:  0300  Responding MD:  Eulas Post  Time MD responded:  [phone]

## 2016-06-21 ENCOUNTER — Inpatient Hospital Stay (HOSPITAL_COMMUNITY): Payer: Medicare Other

## 2016-06-21 DIAGNOSIS — K7200 Acute and subacute hepatic failure without coma: Secondary | ICD-10-CM

## 2016-06-21 DIAGNOSIS — R17 Unspecified jaundice: Secondary | ICD-10-CM

## 2016-06-21 DIAGNOSIS — J189 Pneumonia, unspecified organism: Secondary | ICD-10-CM

## 2016-06-21 DIAGNOSIS — K750 Abscess of liver: Secondary | ICD-10-CM

## 2016-06-21 LAB — COMPREHENSIVE METABOLIC PANEL
ALK PHOS: 135 U/L — AB (ref 38–126)
ALT: 148 U/L — ABNORMAL HIGH (ref 14–54)
ALT: 175 U/L — ABNORMAL HIGH (ref 14–54)
ANION GAP: 6 (ref 5–15)
AST: 81 U/L — ABNORMAL HIGH (ref 15–41)
AST: 99 U/L — AB (ref 15–41)
Albumin: 2.4 g/dL — ABNORMAL LOW (ref 3.5–5.0)
Albumin: 2.6 g/dL — ABNORMAL LOW (ref 3.5–5.0)
Alkaline Phosphatase: 243 U/L — ABNORMAL HIGH (ref 38–126)
Anion gap: 8 (ref 5–15)
BUN: 20 mg/dL (ref 6–20)
BUN: 21 mg/dL — AB (ref 6–20)
CALCIUM: 7.7 mg/dL — AB (ref 8.9–10.3)
CHLORIDE: 96 mmol/L — AB (ref 101–111)
CO2: 25 mmol/L (ref 22–32)
CO2: 23 mmol/L (ref 22–32)
Calcium: 8 mg/dL — ABNORMAL LOW (ref 8.9–10.3)
Chloride: 96 mmol/L — ABNORMAL LOW (ref 101–111)
Creatinine, Ser: 0.75 mg/dL (ref 0.44–1.00)
Creatinine, Ser: 0.49 mg/dL (ref 0.44–1.00)
GFR calc Af Amer: >60 mL/min (ref >60)
GFR calc non Af Amer: >60 mL/min (ref >60)
GLUCOSE: 113 mg/dL — AB (ref 65–99)
Glucose, Bld: 109 mg/dL — ABNORMAL HIGH (ref 65–99)
POTASSIUM: 4.1 mmol/L (ref 3.5–5.1)
Potassium: 3.8 mmol/L (ref 3.5–5.1)
SODIUM: 127 mmol/L — AB (ref 135–145)
Sodium: 127 mmol/L — ABNORMAL LOW (ref 135–145)
Total Bilirubin: 4.2 mg/dL — ABNORMAL HIGH (ref 0.3–1.2)
Total Bilirubin: 9.8 mg/dL — ABNORMAL HIGH (ref 0.3–1.2)
Total Protein: 5.2 g/dL — ABNORMAL LOW (ref 6.5–8.1)
Total Protein: 5.6 g/dL — ABNORMAL LOW (ref 6.5–8.1)

## 2016-06-21 LAB — HAPTOGLOBIN: HAPTOGLOBIN: 117 mg/dL (ref 34–200)

## 2016-06-21 LAB — CBC
HEMATOCRIT: 29.1 % — AB (ref 36.0–46.0)
HEMOGLOBIN: 10.2 g/dL — AB (ref 12.0–15.0)
MCH: 31.5 pg (ref 26.0–34.0)
MCHC: 35.1 g/dL (ref 30.0–36.0)
MCV: 89.8 fL (ref 78.0–100.0)
Platelets: 339 10*3/uL (ref 150–400)
RBC: 3.24 MIL/uL — AB (ref 3.87–5.11)
RDW: 15.4 % (ref 11.5–15.5)
WBC: 36.8 10*3/uL — AB (ref 4.0–10.5)

## 2016-06-21 MED ORDER — ONDANSETRON HCL 4 MG/2ML IJ SOLN
INTRAMUSCULAR | Status: AC
  Filled 2016-06-21: qty 2

## 2016-06-21 MED ORDER — MIDAZOLAM HCL 2 MG/2ML IJ SOLN
INTRAMUSCULAR | Status: AC
  Filled 2016-06-21: qty 4

## 2016-06-21 MED ORDER — MIDAZOLAM HCL 2 MG/2ML IJ SOLN
INTRAMUSCULAR | Status: AC | PRN
Start: 2016-06-21 — End: 2016-06-21
  Administered 2016-06-21: 1 mg via INTRAVENOUS
  Administered 2016-06-21 (×2): 0.5 mg via INTRAVENOUS

## 2016-06-21 MED ORDER — FENTANYL CITRATE (PF) 100 MCG/2ML IJ SOLN
INTRAMUSCULAR | Status: AC
  Filled 2016-06-21: qty 2

## 2016-06-21 MED ORDER — FENTANYL CITRATE (PF) 100 MCG/2ML IJ SOLN
INTRAMUSCULAR | Status: AC | PRN
  Administered 2016-06-21: 25 ug via INTRAVENOUS

## 2016-06-21 MED ORDER — BOOST / RESOURCE BREEZE PO LIQD
1.0000 | Freq: Three times a day (TID) | ORAL | Status: DC
  Administered 2016-06-22 – 2016-06-23 (×6): 1 via ORAL

## 2016-06-21 NOTE — Progress Notes (Signed)
IP PROGRESS NOTE  Subjective:   She reports nausea and right upper abdomen discomfort.  Objective: Vital signs in last 24 hours: Blood pressure 160/68, pulse 119, temperature 98.5 F (36.9 C), temperature source Oral, resp. rate 24, height 5' 3.5" (1.613 m), weight 136 lb 3.9 oz (61.8 kg), SpO2 99 %.  Intake/Output from previous day: 07/09 0701 - 07/10 0700 In: 2640 [I.V.:1400; Blood:990; IV Piggyback:250] Out: 1625 [Urine:1625]  Physical Exam:  Abdomen: No hepatomegaly, nontender    Lab Results:  Recent Labs  06/20/16 0231 06/20/16 2157 06/21/16 0508  WBC 28.3*  --  36.8*  HGB 5.6* 9.5* 10.2*  HCT 15.4* 27.9* 29.1*  PLT 333  --  339    BMET  Recent Labs  06/20/16 1330 06/21/16 0324  NA 128* 127*  K 3.8 4.1  CL 97* 96*  CO2 24 23  GLUCOSE 122* 109*  BUN 21* 21*  CREATININE 0.61 0.49  CALCIUM 7.5* 8.0*   Bilirubin 9.8 Studies/Results: US Abdomen Complete  06/19/2016  CLINICAL DATA:  Hyperbilirubinemia, history hypertension EXAM: ABDOMEN ULTRASOUND COMPLETE COMPARISON:  None FINDINGS: Gallbladder: Normally distended. Few tiny dependent calculi at gallbladder neck. No gallbladder wall thickening pericholecystic fluid. Non shadowing non mobile echogenic focus at mid gallbladder most consistent with polyp 6 mm diameter. No sonographic Murphy sign. Common bile duct: Diameter: Question leaned visualized, question 2 mm diameter Liver: Normal echogenicity. Mass lesion centrally RIGHT lobe 3.6 x 3.6 x 3.6 cm, complex hypoechoic with few areas partial septation. No internal blood flow on color Doppler imaging within this lesion. A second lesion posteriorly RIGHT lobe liver, 5.1 x 4.1 x 5.0 cm, similar characteristics. These require further assessment by CT or MR. No additional focal hepatic abnormalities. Hepatopetal portal venous flow. IVC: Normal appearance Pancreas: Normal parenchymal echogenicity. Small cystic lesion at pancreatic tail 10 x 8 x 9 mm with an additional  questionable tiny cystic lesion at pancreatic tail 9 x 5 x 8 mm. Spleen: Normal appearance, 5.8 cm length Right Kidney: Length: 9.1 cm. Mild collecting system dilatation/hydronephrosis. No mass or calcification. Left Kidney: Length: 10.9 cm. Suboptimal visualization to body habitus. No gross evidence of mass or hydronephrosis. Abdominal aorta: Normal caliber with scattered atherosclerotic change. Other findings: No free fluid IMPRESSION: 2 hepatic masses are identified measuring 3.6cm nad 5.1 cm in greatest sizes, both complex predominately hypoechoic without internal blood flow, uncertain etiology; further characterization of these lesions by MR or CT imaging with and without contrast recommended. Tiny nonspecific cystic lesions at pancreatic tail. Cholelithiasis with additional 6 mm gallbladder polyp, likely benign based on size, no further workup required; This recommendation follows ACR consensus guidelines: White Paper of the ACR Incidental Findings Committee II on Gallbladder and Biliary Findings. J Am Coll Radiol 2013:;10:953-956. Electronically Signed   By: Lavonia Dana M.D.   On: 06/19/2016 15:15   Ct Abdomen Pelvis W Contrast  06/19/2016  CLINICAL DATA:  Worsening upper abdominal pain and fever. EXAM: CT ABDOMEN AND PELVIS WITH CONTRAST TECHNIQUE: Multidetector CT imaging of the abdomen and pelvis was performed using the standard protocol following bolus administration of intravenous contrast. CONTRAST:  36mL ISOVUE-300 IOPAMIDOL (ISOVUE-300) INJECTION 61% COMPARISON:  CT scan of the chest December 10, 2013 FINDINGS: Small bilateral pleural effusions are identified. There is mild atelectasis in the left base. Streaky opacity posteriorly on the right is likely atelectasis as well. No infiltrate suspicious for pneumonia. No nodule or mass. The lung bases are otherwise unremarkable. No free air identified. There is a small  amount of free fluid in the pelvis which is nonspecific. There are 2 masses in the  right hepatic lobe with the first seen on series 3, image 25 measuring 4.7 by 4.1 cm and the other seen on image 28, a little smaller. There is no definitive peripheral nodular enhancement associated with either of these masses, including on delayed images. The masses demonstrate attenuations between 10 and 20 Hounsfield units. These masses are new when compared to the December 10, 2013 study. A few tiny foci of low attenuation in the liver likely represent tiny cysts. Periportal edema is identified. The portal vein is patent. A rounded region of low attenuation is seen in the pancreatic tail on series 3, image 33 measuring 7.5 mm and too small to characterize. Another smaller similar region is seen on image 33 in the body of the pancreas. No other pancreatic masses. The spleen and adrenal glands are normal. 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 There is a probable cyst in the right kidney on image 51, too small to characterize. No solid masses are seen in the right kidney. Low-attenuation in the left kidney on series 3, image 32 is too small to characterize as well. A larger rounded region of low attenuation in the left kidney on image 34 measures up to 17 mm is not definitively cystic on this study with an attenuation of 28 Hounsfield units. No other suspicious abnormality seen in either kidney. There is atherosclerotic change in the tortuous abdominal aorta with no aneurysm or dissection. No adenopathy. The cholelithiasis and probable polyp in the gallbladder seen on recent ultrasound are not seen on the CT scan. The gallbladder is unremarkable. There is a small hiatal hernia. The stomach is decompressed limiting evaluation but no gastric abnormalities are seen. The stomach and small bowel are unremarkable. There is fecal loading throughout the colon. A region of relative narrowing in the sigmoid colon is thought to be due to peristalsis with no identified colonic mass. The patient is status post  appendectomy. No adenopathy is seen in the pelvis. The uterus and adnexae are unremarkable. The bladder is normal. Degenerative changes are seen in the spine. There is anterior wedging of T11 which is new compared to 2014 with approximately 70% loss of height. There is mild anterior wedging of L1 which is age indeterminate but may be chronic. No other bony abnormalities. IMPRESSION: 1. There are 2 low-attenuation masses in the right hepatic lobe new since December 2014. These do not meet the criteria for hemangiomas. The low-attenuation would suggest cystic components. Recommended MRI for further characterisation. Cystic neoplasms or abscesses are not excluded on this study. 2. Indeterminate rounded low-attenuation measuring 17 mm in the left kidney. This could represent a hyperdense cyst or a solid neoplasm based on this CT scan. Recommend attention to this region on the MRI to exclude an underlying solid neoplasm. Other smaller regions of low attenuation in the kidneys are probably cystic. 3. 2 small regions of low attenuation in the pancreatic body and tail are nonspecific. Recommend attention to these regions on the recommended MRI. These are of low suspicion. 4. Age indeterminate compression fractures of T11 and L1. Recommend clinical correlation. 5. There is a small amount of fluid in the pelvis which is nonspecific. 6. Mild periportal edema, nonspecific. 7. Small bilateral effusions with associated atelectasis. 8. Moderate to severe fecal loading throughout the colon. Electronically Signed   By: Dorise Bullion III M.D   On: 06/19/2016 21:37    Medications:  I have reviewed the patient's current medications.  Assessment/Plan:  1. Fever 2. Abnormal liver enzymes, hyperbilirubinemia, hypodense liver lesions noted on CT 06/19/2016 3. Anemia 4. Nonspecific cold agglutinin noted on a blood bank and a measuring  The anemia has improved following a red cell transfusion yesterday. The fever curve is  improved. Cultures are negative to date.  The workup for a cold agglutinin and lymphoproliferative disorder is in process.  The elevated bilirubin is most likely not related to hemolysis in the absence of other markers of significant hemolysis. The alkaline phosphatase is elevated today.  Recommendations: 1. Continue antibiotics and follow-up cultures 2. Workup of the elevated bilirubin and potential liver abscesses per gastroenterology and interventional radiology 3. I will follow-up on the cold agglutinin/lymphoproliferative disorder studies.   LOS: 2 days   Betsy Coder, MD   06/21/2016, 1:52 PM

## 2016-06-21 NOTE — Procedures (Signed)
US/CT gudied liver abscess drain placement 37F 37ml purulent aspirate, sample sent for GS, C&S No complication No blood loss. See complete dictation in [facility].

## 2016-06-21 NOTE — Progress Notes (Signed)
Progress Note   Subjective  Brianna [name] is a 82 year old Caucasian female who was admitted to the hospital on 06/19/16 for complaint of feeling poorly over the past 3 days with a fever and apparent jaundice.  This morning, the patient is found laying in bed with her daughter by her bedside. They are very thankful for the efficiency of this hospital so far. The patient explains that she does feel quite nauseous and has a decreased appetite. She also feels very "sore", up to the level of her sternum and across her abdomen. She tells me this is "likely from the fall" she had a few days ago. The patient and her daughter are aware of plans for interventional radiology to see the patient today. They deny any questions or concerns at this time.   Objective   Vital signs in last 24 hours: Temp:  [97.9 F (36.6 C)-101.5 F (38.6 C)] 98.7 F (37.1 C) (07/10 0800) Pulse Rate:  [82-111] 104 (07/10 0900) Resp:  [12-31] 24 (07/10 0900) BP: (110-174)/(42-72) 138/52 mmHg (07/10 0800) SpO2:  [91 %-100 %] 93 % (07/10 0900) Weight:  [136 lb 3.9 oz (61.8 kg)] 136 lb 3.9 oz (61.8 kg) (07/10 0500) Last BM Date: 06/20/13 General:    Ill appearing jaundiced Caucasian female in NAD Heart:  Tachycardic, Regular rhythm; no murmurs Lungs: Respirations even and unlabored, lungs CTA bilaterally Abdomen:  Soft, generalized tenderness to palpation and nondistended. Normal bowel sounds. Extremities:  Without edema. Neurologic:  Alert and oriented,  grossly normal neurologically. Psych:  Cooperative. Normal mood and affect.  Intake/Output from previous day: 07/09 0701 - 07/10 0700 In: 2640 [I.V.:1400; Blood:990; IV Piggyback:250] Out: 1625 [Urine:1625]  Lab Results:  Recent Labs  06/19/16 1256 06/20/16 0231 06/20/16 2157 06/21/16 0508  WBC 40.0* 28.3*  --  36.8*  HGB 7.2* 5.6* 9.5* 10.2*  HCT 20.3* 15.4* 27.9* 29.1*  PLT 456* 333  --  339   BMET  Recent Labs  06/20/16 0231 06/20/16 1330  06/21/16 0324  NA 127* 128* 127*  K 3.8 3.8 4.1  CL 96* 97* 96*  CO2 25 24 23   GLUCOSE 113* 122* 109*  BUN 20 21* 21*  CREATININE 0.75 0.61 0.49  CALCIUM 7.7* 7.5* 8.0*   LFT  Recent Labs  06/19/16 1655  06/21/16 0324  PROT  --   < > 5.6*  ALBUMIN  --   < > 2.6*  AST  --   < > 99*  ALT  --   < > 175*  ALKPHOS  --   < > 243*  BILITOT 6.0*  < > 9.8*  BILIDIR 2.0*  --   --   IBILI 4.0*  --   --   < > = values in this interval not displayed. PT/INR  Recent Labs  06/20/16 0231 06/20/16 1330  LABPROT 16.4* 15.5*  INR 1.31 1.22    Studies/Results: Dg Chest 2 View  06/19/2016  CLINICAL DATA:  Fever EXAM: CHEST  2 VIEW COMPARISON:  06/16/2016 chest radiograph. FINDINGS: Stable cardiomediastinal silhouette with mild cardiomegaly. No pneumothorax. No pleural effusion. No overt pulmonary edema. New mild patchy opacity at the right lung base. Interval stability of moderate to severe lower thoracic vertebral compression fracture. IMPRESSION: 1. New mild patchy right lung base opacity, cannot exclude aspiration or pneumonia. Recommend follow-up PA and lateral post treatment chest radiographs in 4-6 weeks. 2. Stable mild cardiomegaly without overt pulmonary edema. Electronically Signed   By: Janina Mayo.D.  On: 06/19/2016 13:57   US Abdomen Complete  06/19/2016  CLINICAL DATA:  Hyperbilirubinemia, history hypertension EXAM: ABDOMEN ULTRASOUND COMPLETE COMPARISON:  None FINDINGS: Gallbladder: Normally distended. Few tiny dependent calculi at gallbladder neck. No gallbladder wall thickening pericholecystic fluid. Non shadowing non mobile echogenic focus at mid gallbladder most consistent with polyp 6 mm diameter. No sonographic Murphy sign. Common bile duct: Diameter: Question leaned visualized, question 2 mm diameter Liver: Normal echogenicity. Mass lesion centrally RIGHT lobe 3.6 x 3.6 x 3.6 cm, complex hypoechoic with few areas partial septation. No internal blood flow on color Doppler  imaging within this lesion. A second lesion posteriorly RIGHT lobe liver, 5.1 x 4.1 x 5.0 cm, similar characteristics. These require further assessment by CT or MR. No additional focal hepatic abnormalities. Hepatopetal portal venous flow. IVC: Normal appearance Pancreas: Normal parenchymal echogenicity. Small cystic lesion at pancreatic tail 10 x 8 x 9 mm with an additional questionable tiny cystic lesion at pancreatic tail 9 x 5 x 8 mm. Spleen: Normal appearance, 5.8 cm length Right Kidney: Length: 9.1 cm. Mild collecting system dilatation/hydronephrosis. No mass or calcification. Left Kidney: Length: 10.9 cm. Suboptimal visualization to body habitus. No gross evidence of mass or hydronephrosis. Abdominal aorta: Normal caliber with scattered atherosclerotic change. Other findings: No free fluid IMPRESSION: 2 hepatic masses are identified measuring 3.6cm nad 5.1 cm in greatest sizes, both complex predominately hypoechoic without internal blood flow, uncertain etiology; further characterization of these lesions by MR or CT imaging with and without contrast recommended. Tiny nonspecific cystic lesions at pancreatic tail. Cholelithiasis with additional 6 mm gallbladder polyp, likely benign based on size, no further workup required; This recommendation follows ACR consensus guidelines: White Paper of the ACR Incidental Findings Committee II on Gallbladder and Biliary Findings. J Am Coll Radiol 2013:;10:953-956. Electronically Signed   By: Lavonia Dana M.D.   On: 06/19/2016 15:15   Ct Abdomen Pelvis W Contrast  06/19/2016  CLINICAL DATA:  Worsening upper abdominal pain and fever. EXAM: CT ABDOMEN AND PELVIS WITH CONTRAST TECHNIQUE: Multidetector CT imaging of the abdomen and pelvis was performed using the standard protocol following bolus administration of intravenous contrast. CONTRAST:  77mL ISOVUE-300 IOPAMIDOL (ISOVUE-300) INJECTION 61% COMPARISON:  CT scan of the chest December 10, 2013 FINDINGS: Small bilateral  pleural effusions are identified. There is mild atelectasis in the left base. Streaky opacity posteriorly on the right is likely atelectasis as well. No infiltrate suspicious for pneumonia. No nodule or mass. The lung bases are otherwise unremarkable. No free air identified. There is a small amount of free fluid in the pelvis which is nonspecific. There are 2 masses in the right hepatic lobe with the first seen on series 3, image 25 measuring 4.7 by 4.1 cm and the other seen on image 28, a little smaller. There is no definitive peripheral nodular enhancement associated with either of these masses, including on delayed images. The masses demonstrate attenuations between 10 and 20 Hounsfield units. These masses are new when compared to the December 10, 2013 study. A few tiny foci of low attenuation in the liver likely represent tiny cysts. Periportal edema is identified. The portal vein is patent. A rounded region of low attenuation is seen in the pancreatic tail on series 3, image 33 measuring 7.5 mm and too small to characterize. Another smaller similar region is seen on image 33 in the body of the pancreas. No other pancreatic masses. The spleen and adrenal glands are normal. The right kidney is malrotated with no  hydronephrosis. There is a probable cyst in the right kidney on image 51, too small to characterize. No solid masses are seen in the right kidney. Low-attenuation in the left kidney on series 3, image 32 is too small to characterize as well. A larger rounded region of low attenuation in the left kidney on image 34 measures up to 17 mm is not definitively cystic on this study with an attenuation of 28 Hounsfield units. No other suspicious abnormality seen in either kidney. There is atherosclerotic change in the tortuous abdominal aorta with no aneurysm or dissection. No adenopathy. The cholelithiasis and probable polyp in the gallbladder seen on recent ultrasound are not seen on the CT scan. The  gallbladder is unremarkable. There is a small hiatal hernia. The stomach is decompressed limiting evaluation but no gastric abnormalities are seen. The stomach and small bowel are unremarkable. There is fecal loading throughout the colon. A region of relative narrowing in the sigmoid colon is thought to be due to peristalsis with no identified colonic mass. The patient is status post appendectomy. No adenopathy is seen in the pelvis. The uterus and adnexae are unremarkable. The bladder is normal. Degenerative changes are seen in the spine. There is anterior wedging of T11 which is new compared to 2014 with approximately 70% loss of height. There is mild anterior wedging of L1 which is age indeterminate but may be chronic. No other bony abnormalities. IMPRESSION: 1. There are 2 low-attenuation masses in the right hepatic lobe new since December 2014. These do not meet the criteria for hemangiomas. The low-attenuation would suggest cystic components. Recommended MRI for further characterisation. Cystic neoplasms or abscesses are not excluded on this study. 2. Indeterminate rounded low-attenuation measuring 17 mm in the left kidney. This could represent a hyperdense cyst or a solid neoplasm based on this CT scan. Recommend attention to this region on the MRI to exclude an underlying solid neoplasm. Other smaller regions of low attenuation in the kidneys are probably cystic. 3. 2 small regions of low attenuation in the pancreatic body and tail are nonspecific. Recommend attention to these regions on the recommended MRI. These are of low suspicion. 4. Age indeterminate compression fractures of T11 and L1. Recommend clinical correlation. 5. There is a small amount of fluid in the pelvis which is nonspecific. 6. Mild periportal edema, nonspecific. 7. Small bilateral effusions with associated atelectasis. 8. Moderate to severe fecal loading throughout the colon. Electronically Signed   By: Dorise Bullion III M.D   On:  06/19/2016 21:37       Assessment / Plan:   Assessment: 1. Jaundice: Jaundice thought secondary to hemolysis, CT revealing to right hepatic lobe "masses" with some fluid attenuation, bilirubin increased from 6-9.8 this morning; will need to rule out cystic neoplasm versus hepatic abscess 2. Abnormal CT: See above 3. Cold agglutinin disease 4. Anemia: Hemoglobin now up to 10.2 after 2 units PRBCs 5. Leukocytosis: WBC 40 initially down to 28.3 yesterday and increased back to 36.8 this morning- blood cultures still pending 6. Hypertension 7. History of vertebral fractures 8. Constipation: Patient reports no bowel movement yet after addition of MiraLAX twice daily yesterday  Plan:  1. Spoke with IR this morning, their PA will be around to discuss percutaneous drainage of abscess via CT which is scheduled for later this afternoon-discussed with the patient and her daughter 2. Continue MiraLAX 3. Continue IV antibiotics 4. Continue to monitor hemoglobin with transfusion as necessary 5. Will discuss above with Dr. Loletha Carrow, please  await any further recommendations  Thank you for your kind consultation, we will continue to follow.  Active Problems:   Spinal stenosis of lumbar region   Liver failure (HCC)   Sepsis ([place])   Hyponatremia   Hyperkalemia   Liver masses     LOS: 2 days   Levin Erp  06/21/2016, 9:43 AM  Pager # [phone]   I have reviewed the entire case in detail with the above APP and discussed the plan in detail.  Therefore, I agree with the diagnoses recorded above. In addition,  I have personally interviewed and examined the patient and have personally reviewed any abdominal/pelvic CT scan images.  My additional thoughts are as follows:  CC: jaundice  Related to hepatic abscesses (confirmed on CT guided drainage today), and hemolysis.  There is no evidence of biliary obstruction on [place] or CT scan.  Please consider infectious disease consult if the the  source of this infection remains a mystery.  We will sign off - call as the need arises    Nelida Meuse III Pager [phone]  Mon-Fri 8a-5p [phone] after 5p, weekends, holidays

## 2016-06-21 NOTE — Progress Notes (Signed)
Initial Nutrition Assessment  DOCUMENTATION CODES:   Not applicable  INTERVENTION:  -Ensure Enlive po BID, each supplement provides 350 kcal and 20 grams of protein -RD to continue to monitor  NUTRITION DIAGNOSIS:   Inadequate oral intake related to poor appetite, nausea as evidenced by energy intake < 75% for > 7 days.  GOAL:   Patient will meet greater than or equal to 90% of their needs  MONITOR:   PO intake, I & O's, Skin, Labs, Weight trends  REASON FOR ASSESSMENT:   Malnutrition Screening Tool    ASSESSMENT:   Brianna [name] is a 82 y.o. female with medical history significant of HTN, Thoracic vertebral fracture, who presents today with fevers, jaundice that started during last 24 hours. Patient relates that she was not feeling well on July 5, she pass out that day. She has been having ribs, chest pain since that day after she try to pull her self up. She was evaluated in the ED at that time , had negative CT and was discharge home.  Spoke with Brianna [name], family at bedside. She endorses poor appetite, severe nausea, since 06/15/2016 She is unsure of wt loss at this time, claims normal weight to be about 115-117# Currently she is 136. Daughter, who was visiting her from [place], states patient was eating well prior to onset of this illness. However, patient was forcing herself to eat because she knew she needed to. Appetite has been relatively poor for an extended period time. Patient thought it was stress related, she was supposed to move to [place] soon. Underlying abscesses vs malignancies have likely depleted her appetite.  Nutrition-Focused physical exam completed. Findings are mild fat depletion, mild muscle depletion, and no edema.    Patient is still experiencing severe nausea, to undergo percutaneous drainage of abscess via CT this afternoon.   Labs & medications reviewed: Bilirubin 9.8, Na 127, BUN 21 Multivitamin with minerals  Diet Order:  Diet  Heart Room service appropriate?: Yes; Fluid consistency:: Thin  Skin:  Reviewed, no issues  Last BM:  7/9  Height:   Ht Readings from Last 1 Encounters:  06/19/16 5' 3.5" (1.613 m)    Weight:   Wt Readings from Last 1 Encounters:  06/21/16 136 lb 3.9 oz (61.8 kg)    Ideal Body Weight:  53.4 kg  BMI:  Body mass index is 23.75 kg/(m^2).  Estimated Nutritional Needs:   Kcal:  1350-1550  Protein:  75-95 grams  Fluid:  >/= 1.35L  EDUCATION NEEDS:   No education needs identified at this time  Satira Anis. Vannessa Godown, MS, RD LDN Inpatient Clinical Dietitian Pager [phone]

## 2016-06-21 NOTE — Progress Notes (Signed)
PROGRESS NOTE   Brianna [name]  [id number] DOB: [birth year] DOA: 06/19/2016 PCP: Tawanna Solo, MD    Brief Narrative:  Pt is an 82 year old with history of cold agglutinin and reportedly scarred lung tissue secondary to pneumonia per patient. Presenting with leukocytosis, sepsis, profound anemia. Patient status post transfusion. GI consulted for CT scan results.   Assessment & Plan:    Sepsis present on admission, secondary to liver abscess ([place]) -CT abdomen with findings suggestive of liver abscess. IR consulted. Plan for drain placement later this afternoon. For now, patient is continued on empiric vancomycin and Zosyn. White blood count did initially improve, however has risen to over 30,000. Presently, patient afebrile although she is still septic (tachycardic and with high white count).   Liver masses - GI on board. CT findings are suggestive of liver abscess. Per above, IR consulted and patient is planned for drain placement this afternoon. For now, continue empiric antibiotics per above  Profound anemia - Pt with history of cold agglutinin  - Oncology consulted. Recommendations noted. Patient status post PRBC transfusion.    Hyponatremia - Most likely due to poor oral solute intake. Improved with IV fluids. - Continue to monitor closely. Overall stable    Hyperkalemia - Follow electrolytes. Continue to correct as needed -Potassium currently normal  DVT prophylaxis: SCD's Code Status: DNR Family Communication: Patient in room, multiple family members at bedside  Disposition Plan: Uncertain at this time   Consultants:   GI  Hematology  Interventional radiology   Procedures: None   Antimicrobials: Antibiotics Given (last 72 hours)    Date/Time Action Medication Dose Rate   06/19/16 2121 Given   piperacillin-tazobactam (ZOSYN) IVPB 3.375 g 3.375 g 12.5 mL/hr   06/20/16 0544 Given   piperacillin-tazobactam (ZOSYN) IVPB 3.375 g 3.375 g 12.5 mL/hr   06/20/16 1338 Given   vancomycin (VANCOCIN) 500 mg in sodium chloride 0.9 % 100 mL IVPB 500 mg 100 mL/hr   06/20/16 1507 Given   piperacillin-tazobactam (ZOSYN) IVPB 3.375 g 3.375 g 12.5 mL/hr   06/20/16 2112 Given   piperacillin-tazobactam (ZOSYN) IVPB 3.375 g 3.375 g 12.5 mL/hr   06/21/16 0509 Given   piperacillin-tazobactam (ZOSYN) IVPB 3.375 g 3.375 g 12.5 mL/hr   06/21/16 1348 Given   piperacillin-tazobactam (ZOSYN) IVPB 3.375 g 3.375 g 12.5 mL/hr   06/21/16 1447 Given   vancomycin (VANCOCIN) 500 mg in sodium chloride 0.9 % 100 mL IVPB 500 mg 100 mL/hr        Subjective: Patient with generalized malaise at present. Denies abdominal pain. Eager for drain to be placed.  Objective: Filed Vitals:   06/21/16 1100 06/21/16 1200 06/21/16 1300 06/21/16 1400  BP:  160/68  152/57  Pulse: 115 119 96 96  Temp:  98.5 F (36.9 C)    TempSrc:  Oral    Resp: 26 24 22 14   Height:      Weight:      SpO2: 92% 99% 96% 100%    Intake/Output Summary (Last 24 hours) at 06/21/16 1511 Last data filed at 06/21/16 1100  Gross per 24 hour  Intake   1470 ml  Output   1525 ml  Net    -55 ml   Filed Weights   06/19/16 1255 06/21/16 0500  Weight: 52.164 kg (115 lb) 61.8 kg (136 lb 3.9 oz)    Examination:  General exam: Appears calm and comfortable, Lying in bed  Respiratory system: Clear to auscultation. Respiratory effort normal. Cardiovascular system: S1 & S2 heard, RRR.  Gastrointestinal system: Abdomen is nondistended, soft and nontender. No organomegaly or masses felt. Normal bowel sounds heard. Central nervous system: Alert and oriented. No focal neurological deficits. Extremities: Symmetric 5 x 5 power. Skin: No rashes, lesions Psychiatry: Judgement and insight appear normal. Mood & affect appropriate.     Data Reviewed: I have personally reviewed following labs and imaging studies  CBC:  Recent Labs Lab 06/16/16 0958 06/19/16 1256 06/20/16 0231 06/20/16 2157  06/21/16 0508  WBC 19.4* 40.0* 28.3*  --  36.8*  NEUTROABS  --  37.4*  --   --   --   HGB 9.6* 7.2* 5.6* 9.5* 10.2*  HCT 28.8* 20.3* 15.4* 27.9* 29.1*  MCV 96.3 92.7 91.7  --  89.8  PLT 379 456* 333  --  99991111   Basic Metabolic Panel:  Recent Labs Lab 06/19/16 1256 06/19/16 1854 06/20/16 0231 06/20/16 1330 06/21/16 0324  NA 125* 125* 127* 128* 127*  K 5.7* 4.8 3.8 3.8 4.1  CL 89* 96* 96* 97* 96*  CO2 28 20* 25 24 23   GLUCOSE 138* 115* 113* 122* 109*  BUN 22* 18 20 21* 21*  CREATININE 0.72 0.56 0.75 0.61 0.49  CALCIUM 9.3 8.2* 7.7* 7.5* 8.0*   GFR: Estimated Creatinine Clearance: 39.5 mL/min (by C-G formula based on Cr of 0.49). Liver Function Tests:  Recent Labs Lab 06/19/16 1256 06/19/16 1655 06/20/16 0231 06/21/16 0324  AST 148*  --  81* 99*  ALT 216*  --  148* 175*  ALKPHOS 190*  --  135* 243*  BILITOT 7.0* 6.0* 4.2* 9.8*  PROT 6.8  --  5.2* 5.6*  ALBUMIN 3.3*  --  2.4* 2.6*   No results for input(s): LIPASE, AMYLASE in the last 168 hours. No results for input(s): AMMONIA in the last 168 hours. Coagulation Profile:  Recent Labs Lab 06/20/16 0231 06/20/16 1330  INR 1.31 1.22   Cardiac Enzymes: No results for input(s): CKTOTAL, CKMB, CKMBINDEX, TROPONINI in the last 168 hours. BNP (last 3 results) No results for input(s): PROBNP in the last 8760 hours. HbA1C: No results for input(s): HGBA1C in the last 72 hours. CBG: No results for input(s): GLUCAP in the last 168 hours. Lipid Profile: No results for input(s): CHOL, HDL, LDLCALC, TRIG, CHOLHDL, LDLDIRECT in the last 72 hours. Thyroid Function Tests: No results for input(s): TSH, T4TOTAL, FREET4, T3FREE, THYROIDAB in the last 72 hours. Anemia Panel:  Recent Labs  06/19/16 1854  RETICCTPCT 4.2*   Sepsis Labs:  Recent Labs Lab 06/19/16 1308 06/19/16 1656  LATICACIDVEN 1.56 0.68    Recent Results (from the past 240 hour(s))  Culture, blood (Routine X 2)     Status: None (Preliminary  result)   Collection Time: 06/19/16 12:56 PM  Result Value Ref Range Status   Specimen Description BLOOD RIGHT ARM  Final   Special Requests BOTTLES DRAWN AEROBIC AND ANAEROBIC 5CC  Final   Culture   Final    NO GROWTH 2 DAYS Performed at [facility]    Report Status PENDING  Incomplete  Culture, blood (Routine X 2)     Status: None (Preliminary result)   Collection Time: 06/19/16 12:56 PM  Result Value Ref Range Status   Specimen Description BLOOD LEFT HAND  Final   Special Requests BOTTLES DRAWN AEROBIC AND ANAEROBIC 5CC  Final   Culture   Final    NO GROWTH 2 DAYS Performed at [facility]    Report Status PENDING  Incomplete  Urine culture  Status: Abnormal   Collection Time: 06/19/16  1:31 PM  Result Value Ref Range Status   Specimen Description URINE, CLEAN CATCH  Final   Special Requests NONE  Final   Culture (A)  Final    <10,000 COLONIES/mL INSIGNIFICANT GROWTH Performed at [facility]    Report Status 06/20/2016 FINAL  Final  MRSA PCR Screening     Status: None   Collection Time: 06/19/16  5:18 PM  Result Value Ref Range Status   MRSA by PCR NEGATIVE NEGATIVE Final    Comment:        The GeneXpert MRSA Assay (FDA approved for NASAL specimens only), is one component of a comprehensive MRSA colonization surveillance program. It is not intended to diagnose MRSA infection nor to guide or monitor treatment for MRSA infections.          Radiology Studies: Ct Abdomen Pelvis W Contrast  06/19/2016  CLINICAL DATA:  Worsening upper abdominal pain and fever. EXAM: CT ABDOMEN AND PELVIS WITH CONTRAST TECHNIQUE: Multidetector CT imaging of the abdomen and pelvis was performed using the standard protocol following bolus administration of intravenous contrast. CONTRAST:  6mL ISOVUE-300 IOPAMIDOL (ISOVUE-300) INJECTION 61% COMPARISON:  CT scan of the chest December 10, 2013 FINDINGS: Small bilateral pleural effusions are identified. There is  mild atelectasis in the left base. Streaky opacity posteriorly on the right is likely atelectasis as well. No infiltrate suspicious for pneumonia. No nodule or mass. The lung bases are otherwise unremarkable. No free air identified. There is a small amount of free fluid in the pelvis which is nonspecific. There are 2 masses in the right hepatic lobe with the first seen on series 3, image 25 measuring 4.7 by 4.1 cm and the other seen on image 28, a little smaller. There is no definitive peripheral nodular enhancement associated with either of these masses, including on delayed images. The masses demonstrate attenuations between 10 and 20 Hounsfield units. These masses are new when compared to the December 10, 2013 study. A few tiny foci of low attenuation in the liver likely represent tiny cysts. Periportal edema is identified. The portal vein is patent. A rounded region of low attenuation is seen in the pancreatic tail on series 3, image 33 measuring 7.5 mm and too small to characterize. Another smaller similar region is seen on image 33 in the body of the pancreas. No other pancreatic masses. The spleen and adrenal glands are normal. 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 There is a probable cyst in the right kidney on image 51, too small to characterize. No solid masses are seen in the right kidney. Low-attenuation in the left kidney on series 3, image 32 is too small to characterize as well. A larger rounded region of low attenuation in the left kidney on image 34 measures up to 17 mm is not definitively cystic on this study with an attenuation of 28 Hounsfield units. No other suspicious abnormality seen in either kidney. There is atherosclerotic change in the tortuous abdominal aorta with no aneurysm or dissection. No adenopathy. The cholelithiasis and probable polyp in the gallbladder seen on recent ultrasound are not seen on the CT scan. The gallbladder is unremarkable. There is a small hiatal  hernia. The stomach is decompressed limiting evaluation but no gastric abnormalities are seen. The stomach and small bowel are unremarkable. There is fecal loading throughout the colon. A region of relative narrowing in the sigmoid colon is thought to be due to peristalsis with no  identified colonic mass. The patient is status post appendectomy. No adenopathy is seen in the pelvis. The uterus and adnexae are unremarkable. The bladder is normal. Degenerative changes are seen in the spine. There is anterior wedging of T11 which is new compared to 2014 with approximately 70% loss of height. There is mild anterior wedging of L1 which is age indeterminate but may be chronic. No other bony abnormalities. IMPRESSION: 1. There are 2 low-attenuation masses in the right hepatic lobe new since December 2014. These do not meet the criteria for hemangiomas. The low-attenuation would suggest cystic components. Recommended MRI for further characterisation. Cystic neoplasms or abscesses are not excluded on this study. 2. Indeterminate rounded low-attenuation measuring 17 mm in the left kidney. This could represent a hyperdense cyst or a solid neoplasm based on this CT scan. Recommend attention to this region on the MRI to exclude an underlying solid neoplasm. Other smaller regions of low attenuation in the kidneys are probably cystic. 3. 2 small regions of low attenuation in the pancreatic body and tail are nonspecific. Recommend attention to these regions on the recommended MRI. These are of low suspicion. 4. Age indeterminate compression fractures of T11 and L1. Recommend clinical correlation. 5. There is a small amount of fluid in the pelvis which is nonspecific. 6. Mild periportal edema, nonspecific. 7. Small bilateral effusions with associated atelectasis. 8. Moderate to severe fecal loading throughout the colon. Electronically Signed   By: Dorise Bullion III M.D   On: 06/19/2016 21:37        Scheduled Meds: . sodium  chloride   Intravenous Once  . sodium chloride   Intravenous Once  . antiseptic oral rinse  7 mL Mouth Rinse BID  . conjugated estrogens  1 Applicatorful Vaginal Once per day on Mon Wed Fri  . feeding supplement  1 Container Oral TID BM  . metoprolol succinate  25 mg Oral Daily  . multivitamin with minerals  1 tablet Oral Daily  . piperacillin-tazobactam (ZOSYN)  IV  3.375 g Intravenous Q8H  . polyethylene glycol  17 g Oral BID  . sodium chloride flush  3 mL Intravenous Q12H  . vancomycin  500 mg Intravenous Q24H   Continuous Infusions: . sodium chloride 100 mL/hr at 06/20/16 2104     LOS: 2 days   Time spent: > 35 minutes  Pressley Tadesse, Orpah Melter, MD [facility] Pager [phone]  If 7PM-7AM, please contact night-coverage [email or web address] Password [email or web address] 06/21/2016, 3:11 PM

## 2016-06-21 NOTE — Consult Note (Signed)
Chief Complaint: Patient was seen in consultation today for CT-guided drainage of hepatic abscesses Chief Complaint  Patient presents with  . Weakness  . Fever    Referring Physician(s): Perry,J  Supervising Physician: Arne Cleveland  Patient Status: Inpatient  History of Present Illness: Brianna [name] is a 82 y.o. female with past medical history of hypertension, T 11 fracture who was admitted to the hospital on 7/8 with malaise, fever, intermittent abdominal/substernal discomfort, diminished appetite/oral intake, weakness, jaundice and occasional nausea. Subsequent laboratory evaluation revealed marked leukocytosis, as well as anemia and elevated LFTs. She has received blood transfusion. Current laboratory values reveal WBC of 36.8, hemoglobin 10.2, platelets 339 k, creatinine 0.49, total bilirubin 9.8 , PT 15.5, INR 1.22 . CT of the abdomen and pelvis on 7/8 revealed 2 low attenuation areas in the right hepatic lobe which are new since December 2014 and suspicious for hepatic abscesses and less likely neoplasm. Request now received for CT guided drainage of the above-mentioned abscesses.  Past Medical History  Diagnosis Date  . Hypertension   . Pneumonia   . Bladder infection   . Thoracic vertebral fracture ([place])     T 11, unsure type of fracture    Past Surgical History  Procedure Laterality Date  . Appendectomy    . Fracture surgery      Allergies: Other; Compazine; Erythromycin; Lyrica; Promethazine; Tramadol; and Vistaril  Medications: Prior to Admission medications   Medication Sig Start Date End Date Taking? Authorizing Provider  acetaminophen (TYLENOL) 500 MG tablet Take 1,000 mg by mouth every 6 (six) hours as needed for mild pain, moderate pain, fever or headache.   Yes Historical Provider, MD  conjugated estrogens (PREMARIN) vaginal cream Place 1 Applicatorful vaginally 3 (three) times a week.   Yes Historical Provider, MD  diphenhydrAMINE (BENADRYL)  25 MG tablet Take 25 mg by mouth at bedtime as needed for sleep.    Yes Historical Provider, MD  famotidine (PEPCID) 10 MG tablet Take 10 mg by mouth daily as needed for heartburn or indigestion.   Yes Historical Provider, MD  ibuprofen (ADVIL,MOTRIN) 200 MG tablet Take 400 mg by mouth every 6 (six) hours as needed for fever, headache, mild pain, moderate pain or cramping.   Yes Historical Provider, MD  Melatonin 5 MG TABS Take 5 mg by mouth at bedtime as needed (sleep).   Yes Historical Provider, MD  metoprolol succinate (TOPROL-XL) 25 MG 24 hr tablet Take 1 tablet (25 mg total) by mouth daily. 12/13/13  Yes Robbie Lis, MD  Multiple Vitamin (MULTIVITAMIN WITH MINERALS) TABS tablet Take 1 tablet by mouth daily.   Yes Historical Provider, MD  ondansetron (ZOFRAN) 8 MG tablet Take 8 mg by mouth every 8 (eight) hours as needed for nausea or vomiting.   Yes Historical Provider, MD  Polyethyl Glycol-Propyl Glycol (SYSTANE OP) Apply 2 drops to eye 3 (three) times daily as needed (dry eyes).   Yes Historical Provider, MD  ibuprofen (ADVIL,MOTRIN) 800 MG tablet Take 0.5 tablets (400 mg total) by mouth 3 (three) times daily. Patient not taking: Reported on 06/19/2016 06/16/16   [name], MD     Family History  Problem Relation Age of Onset  . Heart disease Mother   . Heart disease Father   . Hypertension Sister   . Stroke Sister   . Cancer Sister     Social History   Social History  . Marital Status: Widowed    Spouse Name: N/A  .  Number of Children: N/A  . Years of Education: N/A   Social History Main Topics  . Smoking status: Never Smoker   . Smokeless tobacco: Never Used  . Alcohol Use: Yes     Comment: 1 glass wine per day  . Drug Use: No  . Sexual Activity: Not Asked   Other Topics Concern  . None   Social History Narrative     Review of Systems see above  Vital Signs: BP 160/68 mmHg  Pulse 119  Temp(Src) 98.5 F (36.9 C) (Oral)  Resp 24  Ht 5' 3.5" (1.613 m)  Wt 136  lb 3.9 oz (61.8 kg)  BMI 23.75 kg/m2  SpO2 99%  Physical Exam patient awake, alert. Scleral icterus noted. Chest with diminished breath sounds at bases; heart slightly tachycardic but regular rhythm; abdomen soft, positive bowel sounds, mild epigastric tenderness; lower extremities with no significant edema.  Mallampati Score:     Imaging: Dg Chest 2 View  06/19/2016  CLINICAL DATA:  Fever EXAM: CHEST  2 VIEW COMPARISON:  06/16/2016 chest radiograph. FINDINGS: Stable cardiomediastinal silhouette with mild cardiomegaly. No pneumothorax. No pleural effusion. No overt pulmonary edema. New mild patchy opacity at the right lung base. Interval stability of moderate to severe lower thoracic vertebral compression fracture. IMPRESSION: 1. New mild patchy right lung base opacity, cannot exclude aspiration or pneumonia. Recommend follow-up PA and lateral post treatment chest radiographs in 4-6 weeks. 2. Stable mild cardiomegaly without overt pulmonary edema. Electronically Signed   By: Ilona Sorrel M.D.   On: 06/19/2016 13:57   Dg Chest 2 View  06/16/2016  CLINICAL DATA:  Lightheadedness EXAM: CHEST  2 VIEW COMPARISON:  12/10/2013 FINDINGS: Mild cardiac enlargement. Aortic atherosclerosis. There is no pleural effusion or edema. There are coarsened interstitial markings identified bilaterally. No airspace consolidation. There is a compression fracture within the lower thoracic spine, new from previous exam. IMPRESSION: 1. Mild cardiac enlargement and aortic atherosclerosis. 2. Age-indeterminate lower thoracic spine compression fracture. New from 12/10/2013. Electronically Signed   By: Kerby Moors M.D.   On: 06/16/2016 11:14   Dg Pelvis 1-2 Views  06/16/2016  CLINICAL DATA:  Evaluate for pelvis fracture after fall. Initial encounter. EXAM: PELVIS - 1-2 VIEW COMPARISON:  None. FINDINGS: No evidence of pelvic ring fracture or diastasis. Previous sacral insufficiency fractures without current visualization.  Foreshortened appearance of the right femoral neck attributed to rotation. No definitive hip fracture. No dislocation. Osteopenia. IMPRESSION: No acute finding.  If hip pain recommend dedicated hip series. Electronically Signed   By: Monte Fantasia M.D.   On: 06/16/2016 11:06   Ct Head Wo Contrast  06/16/2016  CLINICAL DATA:  Fall. EXAM: CT HEAD WITHOUT CONTRAST TECHNIQUE: Contiguous axial images were obtained from the base of the skull through the vertex without intravenous contrast. COMPARISON:  None. FINDINGS: Brain: There is prominence of the sulci and ventricles. Mild low attenuation within the periventricular and subcortical white matter identified compatible with chronic microvascular disease. No evidence of acute infarction, hemorrhage, extra-axial collection, ventriculomegaly, or mass effect. Vascular: No hyperdense vessel or unexpected calcification. Skull: Negative for fracture or focal lesion. Sinuses/Orbits: No acute findings. Other: None. IMPRESSION: 1. Mild brain atrophy and chronic microvascular disease. 2. No acute findings. Electronically Signed   By: Kerby Moors M.D.   On: 06/16/2016 11:20   US Abdomen Complete  06/19/2016  CLINICAL DATA:  Hyperbilirubinemia, history hypertension EXAM: ABDOMEN ULTRASOUND COMPLETE COMPARISON:  None FINDINGS: Gallbladder: Normally distended. Few tiny dependent calculi at gallbladder  neck. No gallbladder wall thickening pericholecystic fluid. Non shadowing non mobile echogenic focus at mid gallbladder most consistent with polyp 6 mm diameter. No sonographic Murphy sign. Common bile duct: Diameter: Question leaned visualized, question 2 mm diameter Liver: Normal echogenicity. Mass lesion centrally RIGHT lobe 3.6 x 3.6 x 3.6 cm, complex hypoechoic with few areas partial septation. No internal blood flow on color Doppler imaging within this lesion. A second lesion posteriorly RIGHT lobe liver, 5.1 x 4.1 x 5.0 cm, similar characteristics. These require further  assessment by CT or MR. No additional focal hepatic abnormalities. Hepatopetal portal venous flow. IVC: Normal appearance Pancreas: Normal parenchymal echogenicity. Small cystic lesion at pancreatic tail 10 x 8 x 9 mm with an additional questionable tiny cystic lesion at pancreatic tail 9 x 5 x 8 mm. Spleen: Normal appearance, 5.8 cm length Right Kidney: Length: 9.1 cm. Mild collecting system dilatation/hydronephrosis. No mass or calcification. Left Kidney: Length: 10.9 cm. Suboptimal visualization to body habitus. No gross evidence of mass or hydronephrosis. Abdominal aorta: Normal caliber with scattered atherosclerotic change. Other findings: No free fluid IMPRESSION: 2 hepatic masses are identified measuring 3.6cm nad 5.1 cm in greatest sizes, both complex predominately hypoechoic without internal blood flow, uncertain etiology; further characterization of these lesions by MR or CT imaging with and without contrast recommended. Tiny nonspecific cystic lesions at pancreatic tail. Cholelithiasis with additional 6 mm gallbladder polyp, likely benign based on size, no further workup required; This recommendation follows ACR consensus guidelines: White Paper of the ACR Incidental Findings Committee II on Gallbladder and Biliary Findings. J Am Coll Radiol 2013:;10:953-956. Electronically Signed   By: Lavonia Dana M.D.   On: 06/19/2016 15:15   Ct Abdomen Pelvis W Contrast  06/19/2016  CLINICAL DATA:  Worsening upper abdominal pain and fever. EXAM: CT ABDOMEN AND PELVIS WITH CONTRAST TECHNIQUE: Multidetector CT imaging of the abdomen and pelvis was performed using the standard protocol following bolus administration of intravenous contrast. CONTRAST:  10mL ISOVUE-300 IOPAMIDOL (ISOVUE-300) INJECTION 61% COMPARISON:  CT scan of the chest December 10, 2013 FINDINGS: Small bilateral pleural effusions are identified. There is mild atelectasis in the left base. Streaky opacity posteriorly on the right is likely atelectasis  as well. No infiltrate suspicious for pneumonia. No nodule or mass. The lung bases are otherwise unremarkable. No free air identified. There is a small amount of free fluid in the pelvis which is nonspecific. There are 2 masses in the right hepatic lobe with the first seen on series 3, image 25 measuring 4.7 by 4.1 cm and the other seen on image 28, a little smaller. There is no definitive peripheral nodular enhancement associated with either of these masses, including on delayed images. The masses demonstrate attenuations between 10 and 20 Hounsfield units. These masses are new when compared to the December 10, 2013 study. A few tiny foci of low attenuation in the liver likely represent tiny cysts. Periportal edema is identified. The portal vein is patent. A rounded region of low attenuation is seen in the pancreatic tail on series 3, image 33 measuring 7.5 mm and too small to characterize. Another smaller similar region is seen on image 33 in the body of the pancreas. No other pancreatic masses. The spleen and adrenal glands are normal. 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 There is a probable cyst in the right kidney on image 51, too small to characterize. No solid masses are seen in the right kidney. Low-attenuation in the left kidney on series 3,  image 32 is too small to characterize as well. A larger rounded region of low attenuation in the left kidney on image 34 measures up to 17 mm is not definitively cystic on this study with an attenuation of 28 Hounsfield units. No other suspicious abnormality seen in either kidney. There is atherosclerotic change in the tortuous abdominal aorta with no aneurysm or dissection. No adenopathy. The cholelithiasis and probable polyp in the gallbladder seen on recent ultrasound are not seen on the CT scan. The gallbladder is unremarkable. There is a small hiatal hernia. The stomach is decompressed limiting evaluation but no gastric abnormalities are seen. The  stomach and small bowel are unremarkable. There is fecal loading throughout the colon. A region of relative narrowing in the sigmoid colon is thought to be due to peristalsis with no identified colonic mass. The patient is status post appendectomy. No adenopathy is seen in the pelvis. The uterus and adnexae are unremarkable. The bladder is normal. Degenerative changes are seen in the spine. There is anterior wedging of T11 which is new compared to 2014 with approximately 70% loss of height. There is mild anterior wedging of L1 which is age indeterminate but may be chronic. No other bony abnormalities. IMPRESSION: 1. There are 2 low-attenuation masses in the right hepatic lobe new since December 2014. These do not meet the criteria for hemangiomas. The low-attenuation would suggest cystic components. Recommended MRI for further characterisation. Cystic neoplasms or abscesses are not excluded on this study. 2. Indeterminate rounded low-attenuation measuring 17 mm in the left kidney. This could represent a hyperdense cyst or a solid neoplasm based on this CT scan. Recommend attention to this region on the MRI to exclude an underlying solid neoplasm. Other smaller regions of low attenuation in the kidneys are probably cystic. 3. 2 small regions of low attenuation in the pancreatic body and tail are nonspecific. Recommend attention to these regions on the recommended MRI. These are of low suspicion. 4. Age indeterminate compression fractures of T11 and L1. Recommend clinical correlation. 5. There is a small amount of fluid in the pelvis which is nonspecific. 6. Mild periportal edema, nonspecific. 7. Small bilateral effusions with associated atelectasis. 8. Moderate to severe fecal loading throughout the colon. Electronically Signed   By: Dorise Bullion III M.D   On: 06/19/2016 21:37    Labs:  CBC:  Recent Labs  06/16/16 [id number] 06/19/16 1256 06/20/16 0231 06/20/16 2157 06/21/16 0508  WBC 19.4* 40.0* 28.3*  --   36.8*  HGB 9.6* 7.2* 5.6* 9.5* 10.2*  HCT 28.8* 20.3* 15.4* 27.9* 29.1*  PLT 379 456* 333  --  339    COAGS:  Recent Labs  06/20/16 0231 06/20/16 1330  INR 1.31 1.22    BMP:  Recent Labs  06/19/16 1854 06/20/16 0231 06/20/16 1330 06/21/16 0324  NA 125* 127* 128* 127*  K 4.8 3.8 3.8 4.1  CL 96* 96* 97* 96*  CO2 20* 25 24 23   GLUCOSE 115* 113* 122* 109*  BUN 18 20 21* 21*  CALCIUM 8.2* 7.7* 7.5* 8.0*  CREATININE 0.56 0.75 0.61 0.49  GFRNONAA >60 >60 >60 >60  GFRAA >60 >60 >60 >60    LIVER FUNCTION TESTS:  Recent Labs  10/30/15 1155 06/19/16 1256 06/19/16 1655 06/20/16 0231 06/21/16 0324  BILITOT 1.9* 7.0* 6.0* 4.2* 9.8*  AST 22 148*  --  81* 99*  ALT 17 216*  --  148* 175*  ALKPHOS 103 190*  --  135* 243*  PROT 7.6  6.8  --  5.2* 5.6*  ALBUMIN 4.2 3.3*  --  2.4* 2.6*    TUMOR MARKERS: No results for input(s): AFPTM, CEA, CA199, CHROMGRNA in the last 8760 hours.  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 Request now received for CT guided drainage of hepatic abscesses. Imaging studies have been reviewed by Dr. Vernard Gambles. Details/risks of procedure, including but not limited to, internal bleeding, infection/sepsis, injury to adjacent organs, inability to place drain discussed with patient and family with their understanding and consent. Procedure scheduled for later today.   Thank you for this interesting consult.  I greatly enjoyed meeting Jolianna Endres and look forward to participating in their care.  A copy of this report was sent to the requesting provider on this date.  Electronically Signed: D. Rowe Robert 06/21/2016, 1:36 PM   Approximately 40 minutes were spent  in face to face in clinical consultation, greater than 50% of which was  counseling/coordinating care for hepatic abscess drainage

## 2016-06-22 LAB — SODIUM, URINE, RANDOM

## 2016-06-22 LAB — CBC
HCT: 22.9 % — ABNORMAL LOW (ref 36.0–46.0)
HEMOGLOBIN: 8.2 g/dL — AB (ref 12.0–15.0)
MCH: 31.5 pg (ref 26.0–34.0)
MCHC: 35.8 g/dL (ref 30.0–36.0)
MCV: 88.1 fL (ref 78.0–100.0)
Platelets: 413 10*3/uL — ABNORMAL HIGH (ref 150–400)
RBC: 2.6 MIL/uL — ABNORMAL LOW (ref 3.87–5.11)
RDW: 15.2 % (ref 11.5–15.5)
WBC: 30.7 10*3/uL — ABNORMAL HIGH (ref 4.0–10.5)

## 2016-06-22 LAB — TYPE AND SCREEN
ABO/RH(D): A POS
Antibody Screen: POSITIVE
DAT, IgG: NEGATIVE
Unit division: 0
Unit division: 0
Unit division: 0

## 2016-06-22 LAB — COMPREHENSIVE METABOLIC PANEL
ALBUMIN: 2.3 g/dL — AB (ref 3.5–5.0)
ALK PHOS: 235 U/L — AB (ref 38–126)
ALT: 142 U/L — AB (ref 14–54)
ANION GAP: 8 (ref 5–15)
AST: 72 U/L — ABNORMAL HIGH (ref 15–41)
BUN: 20 mg/dL (ref 6–20)
CALCIUM: 7.5 mg/dL — AB (ref 8.9–10.3)
CHLORIDE: 96 mmol/L — AB (ref 101–111)
CO2: 22 mmol/L (ref 22–32)
CREATININE: 0.41 mg/dL — AB (ref 0.44–1.00)
GFR calc Af Amer: >60 mL/min (ref >60)
GFR calc non Af Amer: >60 mL/min (ref >60)
GLUCOSE: 125 mg/dL — AB (ref 65–99)
Potassium: 3.3 mmol/L — ABNORMAL LOW (ref 3.5–5.1)
SODIUM: 126 mmol/L — AB (ref 135–145)
Total Bilirubin: 13.3 mg/dL — ABNORMAL HIGH (ref 0.3–1.2)
Total Protein: 5.5 g/dL — ABNORMAL LOW (ref 6.5–8.1)

## 2016-06-22 LAB — KAPPA/LAMBDA LIGHT CHAINS
KAPPA FREE LGHT CHN: 28 mg/L — AB (ref 3.3–19.4)
KAPPA, LAMDA LIGHT CHAIN RATIO: 2.41 — AB (ref 0.26–1.65)
LAMDA FREE LIGHT CHAINS: 11.6 mg/L (ref 5.7–26.3)

## 2016-06-22 LAB — COLD AGGLUTININ TITER

## 2016-06-22 LAB — C3 COMPLEMENT: C3 COMPLEMENT: 94 mg/dL (ref 82–167)

## 2016-06-22 LAB — OSMOLALITY, URINE: Osmolality, Ur: 515 mOsm/kg (ref 300–900)

## 2016-06-22 LAB — AFP TUMOR MARKER: AFP-Tumor Marker: 7.9 ng/mL (ref 0.0–8.3)

## 2016-06-22 LAB — C4 COMPLEMENT

## 2016-06-22 MED ORDER — ALPRAZOLAM 0.5 MG PO TABS
0.5000 mg | ORAL_TABLET | Freq: Every evening | ORAL | Status: DC | PRN
  Administered 2016-06-22 – 2016-07-05 (×13): 0.5 mg via ORAL
  Filled 2016-06-22 (×14): qty 1

## 2016-06-22 MED ORDER — FENTANYL CITRATE (PF) 100 MCG/2ML IJ SOLN
12.5000 ug | Freq: Three times a day (TID) | INTRAMUSCULAR | Status: DC | PRN
  Administered 2016-06-23: 12.5 ug via INTRAVENOUS
  Filled 2016-06-22: qty 2

## 2016-06-22 MED ORDER — POTASSIUM CHLORIDE CRYS ER 20 MEQ PO TBCR
40.0000 meq | EXTENDED_RELEASE_TABLET | Freq: Once | ORAL | Status: AC
Start: 2016-06-22 — End: 2016-06-22
  Administered 2016-06-22: 40 meq via ORAL
  Filled 2016-06-22: qty 2

## 2016-06-22 MED ORDER — SIMETHICONE 80 MG PO CHEW
80.0000 mg | CHEWABLE_TABLET | Freq: Four times a day (QID) | ORAL | Status: DC | PRN
  Administered 2016-06-25: 80 mg via ORAL

## 2016-06-22 MED ORDER — SACCHAROMYCES BOULARDII 250 MG PO CAPS
250.0000 mg | ORAL_CAPSULE | Freq: Two times a day (BID) | ORAL | Status: DC
  Administered 2016-06-22 – 2016-06-27 (×11): 250 mg via ORAL
  Filled 2016-06-22 (×11): qty 1

## 2016-06-22 MED ORDER — KETOROLAC TROMETHAMINE 15 MG/ML IJ SOLN
15.0000 mg | Freq: Three times a day (TID) | INTRAMUSCULAR | Status: DC | PRN
Start: 2016-06-22 — End: 2016-06-23
  Administered 2016-06-22 – 2016-06-23 (×2): 15 mg via INTRAVENOUS
  Filled 2016-06-22 (×2): qty 1

## 2016-06-22 NOTE — Progress Notes (Signed)
Pharmacy Antibiotic Note  Brianna [name] is a 82 y.o. female admitted on 06/19/2016 with c/o fever and weakness.  Vancomycin and zosyn were started on admission for broad coverage for suspected sepsis. She was also found to have elevated LFTs and now s/p perc drainage of liver abscess on 7/10.  Today, 06/22/2016: - s/p perc drainage of liver abscess on 7/10 - afeb, wbc down 30.7, scr stable 0.41 (crcl~39, pt's 82 y.o) - GI recommends to cont with current abx regimen for now  Plan: - continue zosyn 3.375 gm IV q8h (infuse over 4 hours) - continue vancomycin 500 mg IV q24h.  If to continue with vancomycin, pharmacy will plan on checking level on 7/12 - f/u cultures  _________________________________  Height: 5' 3.5" (161.3 cm) Weight: 143 lb 4.8 oz (65 kg) IBW/kg (Calculated) : 53.55  Temp (24hrs), Avg:98.8 F (37.1 C), Min:97.9 F (36.6 C), Max:99.8 F (37.7 C)   Recent Labs Lab 06/16/16 0958 06/19/16 1256 06/19/16 1308 06/19/16 1656 06/19/16 1854 06/20/16 0231 06/20/16 1330 06/21/16 0324 06/21/16 0508 06/22/16 0316  WBC 19.4* 40.0*  --   --   --  28.3*  --   --  36.8* 30.7*  CREATININE 0.64 0.72  --   --  0.56 0.75 0.61 0.49  --  0.41*  LATICACIDVEN  --   --  1.56 0.68  --   --   --   --   --   --     Estimated Creatinine Clearance: 42.9 mL/min (by C-G formula based on Cr of 0.41).    Allergies  Allergen Reactions  . Other Nausea And Vomiting    *All pain medications*  . Compazine [Prochlorperazine] Other (See Comments)    Restless, hallucinations  . Erythromycin     "passed out"  . Lyrica [Pregabalin] Other (See Comments)    Sleep walking  . Promethazine Other (See Comments)    Restless, hallucinations  . Tramadol Nausea And Vomiting  . Vistaril [Hydroxyzine Hcl] Other (See Comments)    Stated she had hallucinations.    Antimicrobials this admission: 7/8 vancomycin >>   7/8 zosyn >>   Dose adjustments this admission: N/a  Microbiology results: 7/8  BCx x2: sent 7/8 UCx: insignif growth FINAL 7/8 MRSA PCR neg 7/10 liver abscesss:  Thank you for allowing pharmacy to be a part of this patient's care.  Lynelle Doctor 06/22/2016 11:58 AM

## 2016-06-22 NOTE — Progress Notes (Signed)
IP PROGRESS NOTE  Subjective:   She complains of abdominal pain and dyspnea. She underwent placement of a hepatic drain yesterday.  Objective: Vital signs in last 24 hours: Blood pressure 143/66, pulse 103, temperature 97.9 F (36.6 C), temperature source Oral, resp. rate 25, height 5' 3.5" (1.613 m), weight 143 lb 4.8 oz (65 kg), SpO2 94 %.  Intake/Output from previous day: 07/10 0701 - 07/11 0700 In: 1350 [I.V.:1100; IV Piggyback:250] Out: 850 [Urine:850]  Physical Exam: Lungs: Clear bilaterally Cardiac: Regular rate and rhythm Abdomen: No hepatomegaly, right upper quadrant drain with a gauze dressing, tender in the right and left abdomen, no mass    Lab Results:  Recent Labs  06/21/16 0508 06/22/16 0316  WBC 36.8* 30.7*  HGB 10.2* 8.2*  HCT 29.1* 22.9*  PLT 339 413*    BMET  Recent Labs  06/21/16 0324 06/22/16 0316  NA 127* 126*  K 4.1 3.3*  CL 96* 96*  CO2 23 22  GLUCOSE 109* 125*  BUN 21* 20  CREATININE 0.49 0.41*  CALCIUM 8.0* 7.5*  Alkaline phosphatase 235, albumin 2.3, AST 72, ALT 142, bilirubin 13.3   06/21/2016-C3 94, C4 less than 2  Cold agglutinin titer greater than 1:4096  Studies/Results: Ct Image Guided Drainage Percut Cath  Peritoneal Retroperit  06/21/2016  CLINICAL DATA:  past medical history of hypertension, T 11 fracture who was admitted to the hospital on 7/8 with malaise, fever, intermittent abdominal/substernal discomfort, diminished appetite/oral intake, weakness, jaundice and occasional nausea. Subsequent laboratory evaluation revealed marked leukocytosis, as well as anemia and elevated LFTs. She has received blood transfusion. Current laboratory values reveal WBC of 36.8, hemoglobin 10.2, platelets 339 k, creatinine 0.49, total bilirubin 9.8 , PT 15.5, INR 1.22 . CT of the abdomen and pelvis on 7/8 revealed 2 low attenuation areas in the right hepatic lobe which are new since December 2014 and suspicious for hepatic abscesses and less  likely neoplasm. Request now received for CT guided drainage of the above-mentioned abscesses. " EXAM: CT AND ULTRASOUND GUIDED DRAINAGE OF LIVER ABSCESSES ANESTHESIA/SEDATION: Intravenous Fentanyl and Versed were administered as conscious sedation during continuous monitoring of the patient's level of consciousness and physiological / cardiorespiratory status by the radiology RN, with a total moderate sedation time of 25 minutes. PROCEDURE: The procedure, risks, benefits, and alternatives were explained to the patient. Questions regarding the procedure were encouraged and answered. The patient understands and consents to the procedure. The lesions were better localized with ultrasound so survey ultrasound of the level was used to determine appropriate skin entry site. The operative field was prepped with chlorhexidinein a sterile fashion, and a sterile drape was applied covering the operative field. A sterile gown and sterile gloves were used for the procedure. Local anesthesia was provided with 1% Lidocaine. Under real-time ultrasound guidance, a 25 gauge trocar needle was advanced through the segment 5 lesion into the segment 7 lesion. Purulent material could be aspirated. An Amplatz guidewire advanced easily. Limited CT was performed to confirm appropriate needle a guidewire positioning. The tract was then dilated to allow placement of a 12 French multi side-hole biliary drain catheter, positioned with the distal sideholes and the segment 7 collection, proximal side holes in the segment 5 collection. A total of 75 mL purulent material were aspirated. A sample was sent for Gram stain, culture and sensitivity. Catheter was secured externally with 0 Prolene suture and StatLock, and placed to gravity drain after flushing. The patient tolerated the procedure well. COMPLICATIONS: None immediate FINDINGS:  Two complex right lobe fluid collections were identified on ultrasound corresponding to the lesions seen on prior  CT. Multi side hole drain catheter was placed across both lesions as detailed above. IMPRESSION: 1. Technically successful CT-guided drain catheter placement across 2 right lobe hepatic abscesses. Electronically Signed   By: Lucrezia Europe M.D.   On: 06/21/2016 17:30    Medications: I have reviewed the patient's current medications.  Assessment/Plan:  1. Fever 2. Abnormal liver enzymes, hyperbilirubinemia, hypodense liver lesions noted on CT 06/19/2016  Status post placement of a liver drain on 06/21/2010 3. Anemia 4. High titer cold agglutinin  The hemoglobin is lower today. I suspect this is related to phlebotomy, the drain procedure, and equilibration following the transfusion 2 days ago.  She has marked hyperbilirubinemia. I continue to think this is unlikely to be solely related to hemolysis. We will check a fractionated bilirubin again today. The hyperbilirubinemia may be related to septicemia, liver abscesses, and possible underlying liver disease. She does not have evidence of brisk hemolysis on review of the laboratory evaluation to date.  The source for the hepatic abscesses is unclear.  She may have an underlying lymphoproliferative disorder accounting for the cold agglutinin, but this has not been identified on physical exam or imaging to date. We can consider a bone marrow biopsy when she recovers from the acute infection.  Recommendations: 1. Continue antibiotics and follow-up cultures 2. Check fractionated bilirubin, reticulocyte count, and LDH 3. Repeat CBC 06/23/2016   LOS: 3 days   Betsy Coder, MD   06/22/2016, 11:40 AM

## 2016-06-22 NOTE — Progress Notes (Signed)
Patient ID: Brianna [name], female   DOB: October 23, 1924, 82 y.o.   MRN: [id number]    Referring Physician(s): Perry,J  Supervising Physician: Aletta Edouard  Patient Status:  Inpatient  Chief Complaint:  Hepatic abscesses  Subjective: Patient doing fair; still weak with intermittent abdominal cramping, occasional nausea, persistent jaundice, some dyspnea.   Allergies: Other; Compazine; Erythromycin; Lyrica; Promethazine; Tramadol; and Vistaril  Medications: Prior to Admission medications   Medication Sig Start Date End Date Taking? Authorizing Provider  acetaminophen (TYLENOL) 500 MG tablet Take 1,000 mg by mouth every 6 (six) hours as needed for mild pain, moderate pain, fever or headache.   Yes Historical Provider, MD  conjugated estrogens (PREMARIN) vaginal cream Place 1 Applicatorful vaginally 3 (three) times a week.   Yes Historical Provider, MD  diphenhydrAMINE (BENADRYL) 25 MG tablet Take 25 mg by mouth at bedtime as needed for sleep.    Yes Historical Provider, MD  famotidine (PEPCID) 10 MG tablet Take 10 mg by mouth daily as needed for heartburn or indigestion.   Yes Historical Provider, MD  ibuprofen (ADVIL,MOTRIN) 200 MG tablet Take 400 mg by mouth every 6 (six) hours as needed for fever, headache, mild pain, moderate pain or cramping.   Yes Historical Provider, MD  Melatonin 5 MG TABS Take 5 mg by mouth at bedtime as needed (sleep).   Yes Historical Provider, MD  metoprolol succinate (TOPROL-XL) 25 MG 24 hr tablet Take 1 tablet (25 mg total) by mouth daily. 12/13/13  Yes Robbie Lis, MD  Multiple Vitamin (MULTIVITAMIN WITH MINERALS) TABS tablet Take 1 tablet by mouth daily.   Yes Historical Provider, MD  ondansetron (ZOFRAN) 8 MG tablet Take 8 mg by mouth every 8 (eight) hours as needed for nausea or vomiting.   Yes Historical Provider, MD  Polyethyl Glycol-Propyl Glycol (SYSTANE OP) Apply 2 drops to eye 3 (three) times daily as needed (dry eyes).   Yes Historical Provider,  MD  ibuprofen (ADVIL,MOTRIN) 800 MG tablet Take 0.5 tablets (400 mg total) by mouth 3 (three) times daily. Patient not taking: Reported on 06/19/2016 06/16/16   [name], MD     Vital Signs: BP 138/53 mmHg  Pulse 102  Temp(Src) 97.9 F (36.6 C) (Oral)  Resp 26  Ht 5' 3.5" (1.613 m)  Wt 143 lb 4.8 oz (65 kg)  BMI 24.98 kg/m2  SpO2 94%  Physical Exam awake, alert, jaundiced; hepatic drain intact, insertion site okay, mildly tender, output about 100 mL bloody fluid in bag; cultures/cytology pending. Drain irrigated with 5 mL sterile normal saline.  Imaging: Dg Chest 2 View  06/19/2016  CLINICAL DATA:  Fever EXAM: CHEST  2 VIEW COMPARISON:  06/16/2016 chest radiograph. FINDINGS: Stable cardiomediastinal silhouette with mild cardiomegaly. No pneumothorax. No pleural effusion. No overt pulmonary edema. New mild patchy opacity at the right lung base. Interval stability of moderate to severe lower thoracic vertebral compression fracture. IMPRESSION: 1. New mild patchy right lung base opacity, cannot exclude aspiration or pneumonia. Recommend follow-up PA and lateral post treatment chest radiographs in 4-6 weeks. 2. Stable mild cardiomegaly without overt pulmonary edema. Electronically Signed   By: Ilona Sorrel M.D.   On: 06/19/2016 13:57   US Abdomen Complete  06/19/2016  CLINICAL DATA:  Hyperbilirubinemia, history hypertension EXAM: ABDOMEN ULTRASOUND COMPLETE COMPARISON:  None FINDINGS: Gallbladder: Normally distended. Few tiny dependent calculi at gallbladder neck. No gallbladder wall thickening pericholecystic fluid. Non shadowing non mobile echogenic focus at mid gallbladder most consistent with polyp 6 mm diameter. No  sonographic Murphy sign. Common bile duct: Diameter: Question leaned visualized, question 2 mm diameter Liver: Normal echogenicity. Mass lesion centrally RIGHT lobe 3.6 x 3.6 x 3.6 cm, complex hypoechoic with few areas partial septation. No internal blood flow on color Doppler  imaging within this lesion. A second lesion posteriorly RIGHT lobe liver, 5.1 x 4.1 x 5.0 cm, similar characteristics. These require further assessment by CT or MR. No additional focal hepatic abnormalities. Hepatopetal portal venous flow. IVC: Normal appearance Pancreas: Normal parenchymal echogenicity. Small cystic lesion at pancreatic tail 10 x 8 x 9 mm with an additional questionable tiny cystic lesion at pancreatic tail 9 x 5 x 8 mm. Spleen: Normal appearance, 5.8 cm length Right Kidney: Length: 9.1 cm. Mild collecting system dilatation/hydronephrosis. No mass or calcification. Left Kidney: Length: 10.9 cm. Suboptimal visualization to body habitus. No gross evidence of mass or hydronephrosis. Abdominal aorta: Normal caliber with scattered atherosclerotic change. Other findings: No free fluid IMPRESSION: 2 hepatic masses are identified measuring 3.6cm nad 5.1 cm in greatest sizes, both complex predominately hypoechoic without internal blood flow, uncertain etiology; further characterization of these lesions by MR or CT imaging with and without contrast recommended. Tiny nonspecific cystic lesions at pancreatic tail. Cholelithiasis with additional 6 mm gallbladder polyp, likely benign based on size, no further workup required; This recommendation follows ACR consensus guidelines: White Paper of the ACR Incidental Findings Committee II on Gallbladder and Biliary Findings. J Am Coll Radiol 2013:;10:953-956. Electronically Signed   By: Lavonia Dana M.D.   On: 06/19/2016 15:15   Ct Abdomen Pelvis W Contrast  06/19/2016  CLINICAL DATA:  Worsening upper abdominal pain and fever. EXAM: CT ABDOMEN AND PELVIS WITH CONTRAST TECHNIQUE: Multidetector CT imaging of the abdomen and pelvis was performed using the standard protocol following bolus administration of intravenous contrast. CONTRAST:  1mL ISOVUE-300 IOPAMIDOL (ISOVUE-300) INJECTION 61% COMPARISON:  CT scan of the chest December 10, 2013 FINDINGS: Small bilateral  pleural effusions are identified. There is mild atelectasis in the left base. Streaky opacity posteriorly on the right is likely atelectasis as well. No infiltrate suspicious for pneumonia. No nodule or mass. The lung bases are otherwise unremarkable. No free air identified. There is a small amount of free fluid in the pelvis which is nonspecific. There are 2 masses in the right hepatic lobe with the first seen on series 3, image 25 measuring 4.7 by 4.1 cm and the other seen on image 28, a little smaller. There is no definitive peripheral nodular enhancement associated with either of these masses, including on delayed images. The masses demonstrate attenuations between 10 and 20 Hounsfield units. These masses are new when compared to the December 10, 2013 study. A few tiny foci of low attenuation in the liver likely represent tiny cysts. Periportal edema is identified. The portal vein is patent. A rounded region of low attenuation is seen in the pancreatic tail on series 3, image 33 measuring 7.5 mm and too small to characterize. Another smaller similar region is seen on image 33 in the body of the pancreas. No other pancreatic masses. The spleen and adrenal glands are normal. 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 There is a probable cyst in the right kidney on image 51, too small to characterize. No solid masses are seen in the right kidney. Low-attenuation in the left kidney on series 3, image 32 is too small to characterize as well. A larger rounded region of low attenuation in the left kidney on image 34 measures  up to 17 mm is not definitively cystic on this study with an attenuation of 28 Hounsfield units. No other suspicious abnormality seen in either kidney. There is atherosclerotic change in the tortuous abdominal aorta with no aneurysm or dissection. No adenopathy. The cholelithiasis and probable polyp in the gallbladder seen on recent ultrasound are not seen on the CT scan. The  gallbladder is unremarkable. There is a small hiatal hernia. The stomach is decompressed limiting evaluation but no gastric abnormalities are seen. The stomach and small bowel are unremarkable. There is fecal loading throughout the colon. A region of relative narrowing in the sigmoid colon is thought to be due to peristalsis with no identified colonic mass. The patient is status post appendectomy. No adenopathy is seen in the pelvis. The uterus and adnexae are unremarkable. The bladder is normal. Degenerative changes are seen in the spine. There is anterior wedging of T11 which is new compared to 2014 with approximately 70% loss of height. There is mild anterior wedging of L1 which is age indeterminate but may be chronic. No other bony abnormalities. IMPRESSION: 1. There are 2 low-attenuation masses in the right hepatic lobe new since December 2014. These do not meet the criteria for hemangiomas. The low-attenuation would suggest cystic components. Recommended MRI for further characterisation. Cystic neoplasms or abscesses are not excluded on this study. 2. Indeterminate rounded low-attenuation measuring 17 mm in the left kidney. This could represent a hyperdense cyst or a solid neoplasm based on this CT scan. Recommend attention to this region on the MRI to exclude an underlying solid neoplasm. Other smaller regions of low attenuation in the kidneys are probably cystic. 3. 2 small regions of low attenuation in the pancreatic body and tail are nonspecific. Recommend attention to these regions on the recommended MRI. These are of low suspicion. 4. Age indeterminate compression fractures of T11 and L1. Recommend clinical correlation. 5. There is a small amount of fluid in the pelvis which is nonspecific. 6. Mild periportal edema, nonspecific. 7. Small bilateral effusions with associated atelectasis. 8. Moderate to severe fecal loading throughout the colon. Electronically Signed   By: Dorise Bullion III M.D   On:  06/19/2016 21:37   Ct Image Guided Drainage Percut Cath  Peritoneal Retroperit  06/21/2016  CLINICAL DATA:  past medical history of hypertension, T 11 fracture who was admitted to the hospital on 7/8 with malaise, fever, intermittent abdominal/substernal discomfort, diminished appetite/oral intake, weakness, jaundice and occasional nausea. Subsequent laboratory evaluation revealed marked leukocytosis, as well as anemia and elevated LFTs. She has received blood transfusion. Current laboratory values reveal WBC of 36.8, hemoglobin 10.2, platelets 339 k, creatinine 0.49, total bilirubin 9.8 , PT 15.5, INR 1.22 . CT of the abdomen and pelvis on 7/8 revealed 2 low attenuation areas in the right hepatic lobe which are new since December 2014 and suspicious for hepatic abscesses and less likely neoplasm. Request now received for CT guided drainage of the above-mentioned abscesses. " EXAM: CT AND ULTRASOUND GUIDED DRAINAGE OF LIVER ABSCESSES ANESTHESIA/SEDATION: Intravenous Fentanyl and Versed were administered as conscious sedation during continuous monitoring of the patient's level of consciousness and physiological / cardiorespiratory status by the radiology RN, with a total moderate sedation time of 25 minutes. PROCEDURE: The procedure, risks, benefits, and alternatives were explained to the patient. Questions regarding the procedure were encouraged and answered. The patient understands and consents to the procedure. The lesions were better localized with ultrasound so survey ultrasound of the level was used to determine appropriate  skin entry site. The operative field was prepped with chlorhexidinein a sterile fashion, and a sterile drape was applied covering the operative field. A sterile gown and sterile gloves were used for the procedure. Local anesthesia was provided with 1% Lidocaine. Under real-time ultrasound guidance, a 25 gauge trocar needle was advanced through the segment 5 lesion into the segment 7  lesion. Purulent material could be aspirated. An Amplatz guidewire advanced easily. Limited CT was performed to confirm appropriate needle a guidewire positioning. The tract was then dilated to allow placement of a 12 French multi side-hole biliary drain catheter, positioned with the distal sideholes and the segment 7 collection, proximal side holes in the segment 5 collection. A total of 75 mL purulent material were aspirated. A sample was sent for Gram stain, culture and sensitivity. Catheter was secured externally with 0 Prolene suture and StatLock, and placed to gravity drain after flushing. The patient tolerated the procedure well. COMPLICATIONS: None immediate FINDINGS: Two complex right lobe fluid collections were identified on ultrasound corresponding to the lesions seen on prior CT. Multi side hole drain catheter was placed across both lesions as detailed above. IMPRESSION: 1. Technically successful CT-guided drain catheter placement across 2 right lobe hepatic abscesses. Electronically Signed   By: Lucrezia Europe M.D.   On: 06/21/2016 17:30    Labs:  CBC:  Recent Labs  06/19/16 1256 06/20/16 0231 06/20/16 2157 06/21/16 0508 06/22/16 0316  WBC 40.0* 28.3*  --  36.8* 30.7*  HGB 7.2* 5.6* 9.5* 10.2* 8.2*  HCT 20.3* 15.4* 27.9* 29.1* 22.9*  PLT 456* 333  --  339 413*    COAGS:  Recent Labs  06/20/16 0231 06/20/16 1330  INR 1.31 1.22    BMP:  Recent Labs  06/20/16 0231 06/20/16 1330 06/21/16 0324 06/22/16 0316  NA 127* 128* 127* 126*  K 3.8 3.8 4.1 3.3*  CL 96* 97* 96* 96*  CO2 25 24 23 22   GLUCOSE 113* 122* 109* 125*  BUN 20 21* 21* 20  CALCIUM 7.7* 7.5* 8.0* 7.5*  CREATININE 0.75 0.61 0.49 0.41*  GFRNONAA >60 >60 >60 >60  GFRAA >60 >60 >60 >60    LIVER FUNCTION TESTS:  Recent Labs  06/19/16 1256 06/19/16 1655 06/20/16 0231 06/21/16 0324 06/22/16 0316  BILITOT 7.0* 6.0* 4.2* 9.8* 13.3*  AST 148*  --  81* 99* 72*  ALT 216*  --  148* 175* 142*  ALKPHOS  190*  --  135* 243* 235*  PROT 6.8  --  5.2* 5.6* 5.5*  ALBUMIN 3.3*  --  2.4* 2.6* 2.3*    Assessment and Plan: Status post drainage of hepatic abscesses 7/10; afebrile, check fluid cultures/cytology; afebrile; WBC 30.7(36.8), hemoglobin 8.2(10.2), K3.3, creatinine 0.41, total bilirubin 13.3(9.8); continue current treatment and drain irrigation; closely monitor labs; hepatic abscesses are typically slow to resolve; recommend follow-up CT in one week or sooner if clinical status worsens.   Electronically Signed: D. Rowe Robert 06/22/2016, 1:02 PM   I spent a total of 15 minutes at the the patient's bedside AND on the patient's hospital floor or unit, greater than 50% of which was counseling/coordinating care for hepatic abscess

## 2016-06-22 NOTE — Progress Notes (Addendum)
PROGRESS NOTE   Brianna [name]  [id number] DOB: 04-20-1924 DOA: 06/19/2016 PCP: Tawanna Solo, MD    Brief Narrative:  Pt is an 82 year old with history of cold agglutinin and reportedly scarred lung tissue secondary to pneumonia per patient. Presenting with leukocytosis, sepsis, profound anemia. Patient status post transfusion. GI consulted for CT scan results.   Assessment & Plan:    Sepsis present on admission, secondary to liver abscess ([place]) -CT abdomen with findings suggestive of liver abscess. IR consulted. Plan for drain placement later this afternoon. For now, patient is continued on empiric vancomycin and Zosyn. White blood count did initially improve, however has risen to over 30,000. Presently, patient afebrile although she is still septic (tachycardic and with high white count).  Elevated LFT's -No signs of obstruction per GI   Liver abscess - GI on board. CT findings are suggestive of liver abscess. Per above, IR consulted and patient is s/p drain placement yielding purulent fluid, pending culture. For now, continue empiric antibiotics per above  Profound anemia - Pt with history of cold agglutinin  - Oncology consulted. Recommendations noted. Patient status post PRBC transfusion.    Hyponatremia - Most likely due to poor oral solute intake. Improved with IV fluids. - Continue to monitor closely. Overall stable    Hyperkalemia - Follow electrolytes. Continue to correct as needed -Potassium currently normal  DVT prophylaxis: SCD's Code Status: DNR Family Communication: Patient in room, multiple family members at bedside  Disposition Plan: Uncertain at this time   Consultants:   GI  Hematology  Interventional radiology   Procedures: None   Antimicrobials: Antibiotics Given (last 72 hours)    Date/Time Action Medication Dose Rate   06/19/16 2121 Given   piperacillin-tazobactam (ZOSYN) IVPB 3.375 g 3.375 g 12.5 mL/hr   06/20/16 0544 Given    piperacillin-tazobactam (ZOSYN) IVPB 3.375 g 3.375 g 12.5 mL/hr   06/20/16 1338 Given   vancomycin (VANCOCIN) 500 mg in sodium chloride 0.9 % 100 mL IVPB 500 mg 100 mL/hr   06/20/16 1507 Given   piperacillin-tazobactam (ZOSYN) IVPB 3.375 g 3.375 g 12.5 mL/hr   06/20/16 2112 Given   piperacillin-tazobactam (ZOSYN) IVPB 3.375 g 3.375 g 12.5 mL/hr   06/21/16 0509 Given   piperacillin-tazobactam (ZOSYN) IVPB 3.375 g 3.375 g 12.5 mL/hr   06/21/16 1348 Given   piperacillin-tazobactam (ZOSYN) IVPB 3.375 g 3.375 g 12.5 mL/hr   06/21/16 1447 Given   vancomycin (VANCOCIN) 500 mg in sodium chloride 0.9 % 100 mL IVPB 500 mg 100 mL/hr   06/21/16 2225 Given   piperacillin-tazobactam (ZOSYN) IVPB 3.375 g 3.375 g 12.5 mL/hr   06/22/16 0606 Given   piperacillin-tazobactam (ZOSYN) IVPB 3.375 g 3.375 g 12.5 mL/hr   06/22/16 1312 Given   vancomycin (VANCOCIN) 500 mg in sodium chloride 0.9 % 100 mL IVPB 500 mg 100 mL/hr   06/22/16 1312 Given   piperacillin-tazobactam (ZOSYN) IVPB 3.375 g 3.375 g 12.5 mL/hr        Subjective: Complains of generalized abdominal pain  Objective: Filed Vitals:   06/22/16 0800 06/22/16 1000 06/22/16 1200 06/22/16 1400  BP:  143/66 138/53 145/59  Pulse:  103 102 105  Temp: 98.9 F (37.2 C)  99 F (37.2 C)   TempSrc: Oral  Oral   Resp:  25 26 20   Height:      Weight:      SpO2:  94% 94% 96%    Intake/Output Summary (Last 24 hours) at 06/22/16 1457 Last data filed at 06/22/16 1047  Gross per 24 hour  Intake   1200 ml  Output    700 ml  Net    500 ml   Filed Weights   06/19/16 1255 06/21/16 0500 06/22/16 0600  Weight: 52.164 kg (115 lb) 61.8 kg (136 lb 3.9 oz) 65 kg (143 lb 4.8 oz)    Examination:  General exam: Appears calm and comfortable, Lying in bed  Respiratory system: Clear to auscultation. Respiratory effort normal. Cardiovascular system: S1 & S2 heard, RRR.  Gastrointestinal system: Abdomen is nondistended, soft and nontender. No organomegaly  or masses felt. Normal bowel sounds heard. Central nervous system: Alert and oriented. No focal neurological deficits. Extremities: Symmetric 5 x 5 power. Skin: No rashes, lesions Psychiatry: Judgement and insight appear normal. Mood & affect appropriate.     Data Reviewed: I have personally reviewed following labs and imaging studies  CBC:  Recent Labs Lab 06/16/16 0958 06/19/16 1256 06/20/16 0231 06/20/16 2157 06/21/16 0508 06/22/16 0316  WBC 19.4* 40.0* 28.3*  --  36.8* 30.7*  NEUTROABS  --  37.4*  --   --   --   --   HGB 9.6* 7.2* 5.6* 9.5* 10.2* 8.2*  HCT 28.8* 20.3* 15.4* 27.9* 29.1* 22.9*  MCV 96.3 92.7 91.7  --  89.8 88.1  PLT 379 456* 333  --  339 123XX123*   Basic Metabolic Panel:  Recent Labs Lab 06/19/16 1854 06/20/16 0231 06/20/16 1330 06/21/16 0324 06/22/16 0316  NA 125* 127* 128* 127* 126*  K 4.8 3.8 3.8 4.1 3.3*  CL 96* 96* 97* 96* 96*  CO2 20* 25 24 23 22   GLUCOSE 115* 113* 122* 109* 125*  BUN 18 20 21* 21* 20  CREATININE 0.56 0.75 0.61 0.49 0.41*  CALCIUM 8.2* 7.7* 7.5* 8.0* 7.5*   GFR: Estimated Creatinine Clearance: 42.9 mL/min (by C-G formula based on Cr of 0.41). Liver Function Tests:  Recent Labs Lab 06/19/16 1256 06/19/16 1655 06/20/16 0231 06/21/16 0324 06/22/16 0316  AST 148*  --  81* 99* 72*  ALT 216*  --  148* 175* 142*  ALKPHOS 190*  --  135* 243* 235*  BILITOT 7.0* 6.0* 4.2* 9.8* 13.3*  PROT 6.8  --  5.2* 5.6* 5.5*  ALBUMIN 3.3*  --  2.4* 2.6* 2.3*   No results for input(s): LIPASE, AMYLASE in the last 168 hours. No results for input(s): AMMONIA in the last 168 hours. Coagulation Profile:  Recent Labs Lab 06/20/16 0231 06/20/16 1330  INR 1.31 1.22   Cardiac Enzymes: No results for input(s): CKTOTAL, CKMB, CKMBINDEX, TROPONINI in the last 168 hours. BNP (last 3 results) No results for input(s): PROBNP in the last 8760 hours. HbA1C: No results for input(s): HGBA1C in the last 72 hours. CBG: No results for input(s):  GLUCAP in the last 168 hours. Lipid Profile: No results for input(s): CHOL, HDL, LDLCALC, TRIG, CHOLHDL, LDLDIRECT in the last 72 hours. Thyroid Function Tests: No results for input(s): TSH, T4TOTAL, FREET4, T3FREE, THYROIDAB in the last 72 hours. Anemia Panel:  Recent Labs  06/19/16 1854  RETICCTPCT 4.2*   Sepsis Labs:  Recent Labs Lab 06/19/16 1308 06/19/16 1656  LATICACIDVEN 1.56 0.68    Recent Results (from the past 240 hour(s))  Culture, blood (Routine X 2)     Status: None (Preliminary result)   Collection Time: 06/19/16 12:56 PM  Result Value Ref Range Status   Specimen Description BLOOD RIGHT ARM  Final   Special Requests BOTTLES DRAWN AEROBIC AND ANAEROBIC 5CC  Final  Culture   Final    NO GROWTH 3 DAYS Performed at [facility]    Report Status PENDING  Incomplete  Culture, blood (Routine X 2)     Status: None (Preliminary result)   Collection Time: 06/19/16 12:56 PM  Result Value Ref Range Status   Specimen Description BLOOD LEFT HAND  Final   Special Requests BOTTLES DRAWN AEROBIC AND ANAEROBIC 5CC  Final   Culture   Final    NO GROWTH 3 DAYS Performed at [facility]    Report Status PENDING  Incomplete  Urine culture     Status: Abnormal   Collection Time: 06/19/16  1:31 PM  Result Value Ref Range Status   Specimen Description URINE, CLEAN CATCH  Final   Special Requests NONE  Final   Culture (A)  Final    <10,000 COLONIES/mL INSIGNIFICANT GROWTH Performed at [facility]    Report Status 06/20/2016 FINAL  Final  MRSA PCR Screening     Status: None   Collection Time: 06/19/16  5:18 PM  Result Value Ref Range Status   MRSA by PCR NEGATIVE NEGATIVE Final    Comment:        The GeneXpert MRSA Assay (FDA approved for NASAL specimens only), is one component of a comprehensive MRSA colonization surveillance program. It is not intended to diagnose MRSA infection nor to guide or monitor treatment for MRSA infections.    Aerobic/Anaerobic Culture (surgical/deep wound)     Status: None (Preliminary result)   Collection Time: 06/21/16  4:40 PM  Result Value Ref Range Status   Specimen Description LIVER ABSCESS  Final   Special Requests NONE  Final   Gram Stain   Final    ABUNDANT WBC PRESENT, PREDOMINANTLY PMN NO ORGANISMS SEEN    Culture   Final    NO GROWTH < 24 HOURS Performed at [facility]    Report Status PENDING  Incomplete         Radiology Studies: Ct Image Guided Drainage Percut Cath  Peritoneal Retroperit  06/21/2016  CLINICAL DATA:  past medical history of hypertension, T 11 fracture who was admitted to the hospital on 7/8 with malaise, fever, intermittent abdominal/substernal discomfort, diminished appetite/oral intake, weakness, jaundice and occasional nausea. Subsequent laboratory evaluation revealed marked leukocytosis, as well as anemia and elevated LFTs. She has received blood transfusion. Current laboratory values reveal WBC of 36.8, hemoglobin 10.2, platelets 339 k, creatinine 0.49, total bilirubin 9.8 , PT 15.5, INR 1.22 . CT of the abdomen and pelvis on 7/8 revealed 2 low attenuation areas in the right hepatic lobe which are new since December 2014 and suspicious for hepatic abscesses and less likely neoplasm. Request now received for CT guided drainage of the above-mentioned abscesses. " EXAM: CT AND ULTRASOUND GUIDED DRAINAGE OF LIVER ABSCESSES ANESTHESIA/SEDATION: Intravenous Fentanyl and Versed were administered as conscious sedation during continuous monitoring of the patient's level of consciousness and physiological / cardiorespiratory status by the radiology RN, with a total moderate sedation time of 25 minutes. PROCEDURE: The procedure, risks, benefits, and alternatives were explained to the patient. Questions regarding the procedure were encouraged and answered. The patient understands and consents to the procedure. The lesions were better localized with ultrasound so  survey ultrasound of the level was used to determine appropriate skin entry site. The operative field was prepped with chlorhexidinein a sterile fashion, and a sterile drape was applied covering the operative field. A sterile gown and sterile gloves were used for  the procedure. Local anesthesia was provided with 1% Lidocaine. Under real-time ultrasound guidance, a 25 gauge trocar needle was advanced through the segment 5 lesion into the segment 7 lesion. Purulent material could be aspirated. An Amplatz guidewire advanced easily. Limited CT was performed to confirm appropriate needle a guidewire positioning. The tract was then dilated to allow placement of a 12 French multi side-hole biliary drain catheter, positioned with the distal sideholes and the segment 7 collection, proximal side holes in the segment 5 collection. A total of 75 mL purulent material were aspirated. A sample was sent for Gram stain, culture and sensitivity. Catheter was secured externally with 0 Prolene suture and StatLock, and placed to gravity drain after flushing. The patient tolerated the procedure well. COMPLICATIONS: None immediate FINDINGS: Two complex right lobe fluid collections were identified on ultrasound corresponding to the lesions seen on prior CT. Multi side hole drain catheter was placed across both lesions as detailed above. IMPRESSION: 1. Technically successful CT-guided drain catheter placement across 2 right lobe hepatic abscesses. Electronically Signed   By: Lucrezia Europe M.D.   On: 06/21/2016 17:30        Scheduled Meds: . sodium chloride   Intravenous Once  . sodium chloride   Intravenous Once  . antiseptic oral rinse  7 mL Mouth Rinse BID  . conjugated estrogens  1 Applicatorful Vaginal Once per day on Mon Wed Fri  . feeding supplement  1 Container Oral TID BM  . metoprolol succinate  25 mg Oral Daily  . multivitamin with minerals  1 tablet Oral Daily  . piperacillin-tazobactam (ZOSYN)  IV  3.375 g  Intravenous Q8H  . polyethylene glycol  17 g Oral BID  . saccharomyces boulardii  250 mg Oral BID  . sodium chloride flush  3 mL Intravenous Q12H  . vancomycin  500 mg Intravenous Q24H   Continuous Infusions: . sodium chloride 100 mL/hr at 06/20/16 2104     LOS: 3 days   Time spent: > 35 minutes  Faizah Kandler, Orpah Melter, MD [facility] Pager [phone]  If 7PM-7AM, please contact night-coverage [email or web address] Password [facility] 06/22/2016, 2:57 PM

## 2016-06-23 DIAGNOSIS — K750 Abscess of liver: Secondary | ICD-10-CM

## 2016-06-23 LAB — MULTIPLE MYELOMA PANEL, SERUM
ALBUMIN SERPL ELPH-MCNC: 2.4 g/dL — AB (ref 2.9–4.4)
ALPHA2 GLOB SERPL ELPH-MCNC: 0.5 g/dL (ref 0.4–1.0)
Albumin/Glob SerPl: 0.9 (ref 0.7–1.7)
Alpha 1: 0.5 g/dL — ABNORMAL HIGH (ref 0.0–0.4)
B-GLOBULIN SERPL ELPH-MCNC: 0.6 g/dL — AB (ref 0.7–1.3)
GAMMA GLOB SERPL ELPH-MCNC: 1.2 g/dL (ref 0.4–1.8)
Globulin, Total: 2.8 g/dL (ref 2.2–3.9)
IGG (IMMUNOGLOBIN G), SERUM: 307 mg/dL — AB (ref 700–1600)
IgA: 59 mg/dL — ABNORMAL LOW (ref 64–422)
IgM, Serum: 1351 mg/dL — ABNORMAL HIGH (ref 26–217)
M Protein SerPl Elph-Mcnc: 0.8 g/dL — ABNORMAL HIGH
TOTAL PROTEIN ELP: 5.2 g/dL — AB (ref 6.0–8.5)

## 2016-06-23 LAB — OSMOLALITY: Osmolality: 275 mOsm/kg (ref 275–295)

## 2016-06-23 LAB — BILIRUBIN, FRACTIONATED(TOT/DIR/INDIR)
BILIRUBIN DIRECT: 3.5 mg/dL — AB (ref 0.1–0.5)
BILIRUBIN INDIRECT: 4.3 mg/dL — AB (ref 0.3–0.9)
Total Bilirubin: 7.8 mg/dL — ABNORMAL HIGH (ref 0.3–1.2)

## 2016-06-23 LAB — COMPREHENSIVE METABOLIC PANEL
ALT: 109 U/L — ABNORMAL HIGH (ref 14–54)
AST: 44 U/L — AB (ref 15–41)
Albumin: 1.9 g/dL — ABNORMAL LOW (ref 3.5–5.0)
Alkaline Phosphatase: 206 U/L — ABNORMAL HIGH (ref 38–126)
Anion gap: 5 (ref 5–15)
BILIRUBIN TOTAL: 7.8 mg/dL — AB (ref 0.3–1.2)
BUN: 23 mg/dL — AB (ref 6–20)
CHLORIDE: 100 mmol/L — AB (ref 101–111)
CO2: 23 mmol/L (ref 22–32)
CREATININE: 0.43 mg/dL — AB (ref 0.44–1.00)
Calcium: 7.4 mg/dL — ABNORMAL LOW (ref 8.9–10.3)
Glucose, Bld: 123 mg/dL — ABNORMAL HIGH (ref 65–99)
POTASSIUM: 4.1 mmol/L (ref 3.5–5.1)
Sodium: 128 mmol/L — ABNORMAL LOW (ref 135–145)
TOTAL PROTEIN: 4.6 g/dL — AB (ref 6.5–8.1)

## 2016-06-23 LAB — LACTIC ACID, PLASMA: Lactic Acid, Venous: 0.7 mmol/L (ref 0.5–1.9)

## 2016-06-23 LAB — RETICULOCYTES
RBC.: 2.16 MIL/uL — AB (ref 3.87–5.11)
RETIC COUNT ABSOLUTE: 54 10*3/uL (ref 19.0–186.0)
RETIC CT PCT: 2.5 % (ref 0.4–3.1)

## 2016-06-23 LAB — PREPARE RBC (CROSSMATCH)

## 2016-06-23 LAB — LACTATE DEHYDROGENASE: LDH: 224 U/L — AB (ref 98–192)

## 2016-06-23 MED ORDER — FENTANYL CITRATE (PF) 100 MCG/2ML IJ SOLN
12.5000 ug | INTRAMUSCULAR | Status: DC | PRN
  Administered 2016-06-23 – 2016-07-01 (×10): 12.5 ug via INTRAVENOUS
  Filled 2016-06-23 (×11): qty 2

## 2016-06-23 MED ORDER — SIMETHICONE 80 MG PO CHEW
80.0000 mg | CHEWABLE_TABLET | Freq: Four times a day (QID) | ORAL | Status: DC
  Administered 2016-06-23 – 2016-07-05 (×44): 80 mg via ORAL
  Filled 2016-06-23 (×47): qty 1

## 2016-06-23 MED ORDER — SODIUM CHLORIDE 0.9 % IV SOLN
Freq: Once | INTRAVENOUS | Status: AC
  Administered 2016-06-23: 12:00:00 via INTRAVENOUS

## 2016-06-23 NOTE — Progress Notes (Signed)
IP PROGRESS NOTE  Subjective:   She complains of upper abdominal pain.  Objective: Vital signs in last 24 hours: Blood pressure 122/48, pulse 94, temperature 97.9 F (36.6 C), temperature source Oral, resp. rate 22, height 5' 3.5" (1.613 m), weight 143 lb 4.8 oz (65 kg), SpO2 93 %.  Intake/Output from previous day: 07/11 0701 - 07/12 0700 In: 3865 [P.O.:360; I.V.:3400; IV Piggyback:100] Out: 1000 [Urine:950]  Physical Exam: Lungs: Clear anteriorly Cardiac: Regular rate and rhythm Abdomen: No hepatomegaly, right upper quadrant drain with a gauze dressing, tender in the right subcostal region, no mass, the abdomen is mildly distended    Lab Results:  Recent Labs  06/22/16 0316 06/23/16 0423  WBC 30.7* 34.1*  HGB 8.2* 6.7*  HCT 22.9* 18.9*  PLT 413* 479*    BMET  Recent Labs  06/22/16 0316 06/23/16 0326  NA 126* 128*  K 3.3* 4.1  CL 96* 100*  CO2 22 23  GLUCOSE 125* 123*  BUN 20 23*  CREATININE 0.41* 0.43*  CALCIUM 7.5* 7.4*  bili. 7.8,direct bili 3.5  7/12- retic. 54 06/21/2016-C3 94, C4 less than 2  Cold agglutinin titer greater than 1:4096  Studies/Results: Ct Image Guided Drainage Percut Cath  Peritoneal Retroperit  06/21/2016  CLINICAL DATA:  past medical history of hypertension, T 11 fracture who was admitted to the hospital on 7/8 with malaise, fever, intermittent abdominal/substernal discomfort, diminished appetite/oral intake, weakness, jaundice and occasional nausea. Subsequent laboratory evaluation revealed marked leukocytosis, as well as anemia and elevated LFTs. She has received blood transfusion. Current laboratory values reveal WBC of 36.8, hemoglobin 10.2, platelets 339 k, creatinine 0.49, total bilirubin 9.8 , PT 15.5, INR 1.22 . CT of the abdomen and pelvis on 7/8 revealed 2 low attenuation areas in the right hepatic lobe which are new since December 2014 and suspicious for hepatic abscesses and less likely neoplasm. Request now received for CT  guided drainage of the above-mentioned abscesses. " EXAM: CT AND ULTRASOUND GUIDED DRAINAGE OF LIVER ABSCESSES ANESTHESIA/SEDATION: Intravenous Fentanyl and Versed were administered as conscious sedation during continuous monitoring of the patient's level of consciousness and physiological / cardiorespiratory status by the radiology RN, with a total moderate sedation time of 25 minutes. PROCEDURE: The procedure, risks, benefits, and alternatives were explained to the patient. Questions regarding the procedure were encouraged and answered. The patient understands and consents to the procedure. The lesions were better localized with ultrasound so survey ultrasound of the level was used to determine appropriate skin entry site. The operative field was prepped with chlorhexidinein a sterile fashion, and a sterile drape was applied covering the operative field. A sterile gown and sterile gloves were used for the procedure. Local anesthesia was provided with 1% Lidocaine. Under real-time ultrasound guidance, a 25 gauge trocar needle was advanced through the segment 5 lesion into the segment 7 lesion. Purulent material could be aspirated. An Amplatz guidewire advanced easily. Limited CT was performed to confirm appropriate needle a guidewire positioning. The tract was then dilated to allow placement of a 12 French multi side-hole biliary drain catheter, positioned with the distal sideholes and the segment 7 collection, proximal side holes in the segment 5 collection. A total of 75 mL purulent material were aspirated. A sample was sent for Gram stain, culture and sensitivity. Catheter was secured externally with 0 Prolene suture and StatLock, and placed to gravity drain after flushing. The patient tolerated the procedure well. COMPLICATIONS: None immediate FINDINGS: Two complex right lobe fluid collections were identified on  ultrasound corresponding to the lesions seen on prior CT. Multi side hole drain catheter was placed  across both lesions as detailed above. IMPRESSION: 1. Technically successful CT-guided drain catheter placement across 2 right lobe hepatic abscesses. Electronically Signed   By: Lucrezia Europe M.D.   On: 06/21/2016 17:30    Medications: I have reviewed the patient's current medications.  Assessment/Plan:  1. Fever 2. Abnormal liver enzymes, hyperbilirubinemia, hypodense liver lesions noted on CT 06/19/2016  Status post placement of a liver drain on 06/21/2010 3. Anemia 4. High titer cold agglutinin  The hemoglobin is lower today. She likely has a degree of hemolysis in addition to bone marrow suppression. 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 She is not mounting an appropriate reticulocytosis. We will consider a bone marrow biopsy after the infection has been treated. The source for the hepatic abscesses is unclear.  She may have an underlying lymphoproliferative disorder accounting for the cold agglutinin, but this has not been identified on physical exam or imaging to date. We can consider a bone marrow biopsy when she recovers from the acute infection.   Recommendations: 1. Continue antibiotics and follow-up cultures 2. Transfuse packed red blood cells with a blood warmer today 3. Repeat CBC 06/24/2016 4. Evaluation of abdominal pain per the Medical service   LOS: 4 days   Betsy Coder, MD   06/23/2016, 7:11 AM

## 2016-06-23 NOTE — Progress Notes (Addendum)
[facility] Progress Note   Brianna [name]  [id number]  DOB: 1924/03/27  DOA: 06/19/2016 PCP: Tawanna Solo, MD  Brief narrative: Brianna [name] is a 82 y.o. female with HTN, cold agglutinin disease presenting with temp of 103, jaundice and dark urine and weight loss of about 10 lbs. She had been feeling bad since 7/5 and was seen in the ER for syncope and discharged home. She returned to there ER after she was noted to be jaundiced. She was found to have elevated LFTS, WBC count of 40 and a CT scan showing right lobe hepatic abscesses. She also had a sodium of 125 and potassium of 5.7. Tylenol level was normal.    Subjective: Abdominal pains that feel like gas pain. Generalized body aches. No appetite and intermittent nausea.   Assessment/Plan: Principal Problem:   Hepatic abscess/Sepsis/ Elevated LFTs - Vanc and Zosyn - drain placed on 7/10 per IR - f/u cultures - ID consulted today   Active Problems:  Anemia- h/o cold agglutinin disease - Hb dropped from 9 to 5.6 on 7/9- transfused 3 U PRBC - Hb again dropped to 6.7 today- hemolysis? - no signs of bleeding - cold agglutinin titer > 1: 4096 - last LDH and haptoglobin earlier in on the admission were not consistent with hemolysis  -  transfuse 2 U today - oncology assisting with management- questioning underlying proliferative disorder  Hyponatremia - due to dehydration, hypoalbuminemia and third spacing - U sodium < 10   - cont NS infusion  Hypoalbuminemia/ severe protein calorie malnutrition/ mild anasarca  - due to abscesses and poor appetite - Boost TID  Abdominal pain - current pain more gas like as it is sharp and in right upper abdomen- abdomen is distended and tympatic- start Gas x      Benign essential HTN   - Metoprolol    Hyperkalemia - cause? - resolved     Antibiotics: Anti-infectives    Start     Dose/Rate Route Frequency Ordered Stop   06/20/16 1400  vancomycin (VANCOCIN) 500 mg  in sodium chloride 0.9 % 100 mL IVPB     500 mg 100 mL/hr over 60 Minutes Intravenous Every 24 hours 06/19/16 1254     06/19/16 2200  piperacillin-tazobactam (ZOSYN) IVPB 3.375 g     3.375 g 12.5 mL/hr over 240 Minutes Intravenous Every 8 hours 06/19/16 1254     06/19/16 1245  piperacillin-tazobactam (ZOSYN) IVPB 3.375 g     3.375 g 100 mL/hr over 30 Minutes Intravenous  Once 06/19/16 1242 06/19/16 1335   06/19/16 1245  vancomycin (VANCOCIN) IVPB 1000 mg/200 mL premix     1,000 mg 200 mL/hr over 60 Minutes Intravenous  Once 06/19/16 1242 06/19/16 1456     Code Status:     Code Status Orders        Start     Ordered   06/19/16 1721  Do not attempt resuscitation (DNR)   Continuous    Question Answer Comment  In the event of cardiac or respiratory ARREST Do not call a "code blue"   In the event of cardiac or respiratory ARREST Do not perform Intubation, CPR, defibrillation or ACLS   In the event of cardiac or respiratory ARREST Use medication by any route, position, wound care, and other measures to relive pain and suffering. May use oxygen, suction and manual treatment of airway obstruction as needed for comfort.      06/19/16 1720    Code Status History  Date Active Date Inactive Code Status Order ID Comments User Context   12/10/2013  5:39 AM 12/13/2013  2:12 PM Full Code [id number]  Etta Quill, DO ED     Family Communication: daughters Disposition Plan: to be determined DVT prophylaxis: SCDS Consultants: hematology, GI, ID Procedures: RUQ drain    Objective: Filed Weights   06/19/16 1255 06/21/16 0500 06/22/16 0600  Weight: 52.164 kg (115 lb) 61.8 kg (136 lb 3.9 oz) 65 kg (143 lb 4.8 oz)    Intake/Output Summary (Last 24 hours) at 06/23/16 1539 Last data filed at 06/23/16 1430  Gross per 24 hour  Intake 4507.5 ml  Output   1400 ml  Net 3107.5 ml     Vitals Filed Vitals:   06/23/16 1350 06/23/16 1355 06/23/16 1400 06/23/16 1500  BP:  148/77 147/74    Pulse: 101 106 96 101  Temp:  99.1 F (37.3 C)    TempSrc:  Oral    Resp: 19 21 24 27   Height:      Weight:      SpO2: 100% 91% 100% 98%    Exam:  General:  Pt is alert, not in acute distress  HEENT: +++ icterus, No thrush, oral mucosa moist  Cardiovascular: regular rate and rhythm, S1/S2 No murmur  Respiratory: clear to auscultation bilaterally   Abdomen: Soft, +Bowel sounds,   Tender in LUQ and RUQ, tympanic/  distended, no guarding  MSK: No cyanosis or clubbing- mild pedal edema   Data Reviewed: Basic Metabolic Panel:  Recent Labs Lab 06/20/16 0231 06/20/16 1330 06/21/16 0324 06/22/16 0316 06/23/16 0326  NA 127* 128* 127* 126* 128*  K 3.8 3.8 4.1 3.3* 4.1  CL 96* 97* 96* 96* 100*  CO2 25 24 23 22 23   GLUCOSE 113* 122* 109* 125* 123*  BUN 20 21* 21* 20 23*  CREATININE 0.75 0.61 0.49 0.41* 0.43*  CALCIUM 7.7* 7.5* 8.0* 7.5* 7.4*   Liver Function Tests:  Recent Labs Lab 06/19/16 1256 06/19/16 1655 06/20/16 0231 06/21/16 0324 06/22/16 0316 06/23/16 0326  AST 148*  --  81* 99* 72* 44*  ALT 216*  --  148* 175* 142* 109*  ALKPHOS 190*  --  135* 243* 235* 206*  BILITOT 7.0* 6.0* 4.2* 9.8* 13.3* 7.8*  7.8*  PROT 6.8  --  5.2* 5.6* 5.5* 4.6*  ALBUMIN 3.3*  --  2.4* 2.6* 2.3* 1.9*   No results for input(s): LIPASE, AMYLASE in the last 168 hours. No results for input(s): AMMONIA in the last 168 hours. CBC:  Recent Labs Lab 06/19/16 1256 06/20/16 0231 06/20/16 2157 06/21/16 0508 06/22/16 0316 06/23/16 0423  WBC 40.0* 28.3*  --  36.8* 30.7* 34.1*  NEUTROABS 37.4*  --   --   --   --   --   HGB 7.2* 5.6* 9.5* 10.2* 8.2* 6.7*  HCT 20.3* 15.4* 27.9* 29.1* 22.9* 18.9*  MCV 92.7 91.7  --  89.8 88.1 87.5  PLT 456* 333  --  339 413* 479*   Cardiac Enzymes: No results for input(s): CKTOTAL, CKMB, CKMBINDEX, TROPONINI in the last 168 hours. BNP (last 3 results) No results for input(s): BNP in the last 8760 hours.  ProBNP (last 3 results) No results  for input(s): PROBNP in the last 8760 hours.  CBG: No results for input(s): GLUCAP in the last 168 hours.  Recent Results (from the past 240 hour(s))  Culture, blood (Routine X 2)     Status: None (Preliminary result)   Collection  Time: 06/19/16 12:56 PM  Result Value Ref Range Status   Specimen Description BLOOD RIGHT ARM  Final   Special Requests BOTTLES DRAWN AEROBIC AND ANAEROBIC 5CC  Final   Culture   Final    NO GROWTH 4 DAYS Performed at [facility]    Report Status PENDING  Incomplete  Culture, blood (Routine X 2)     Status: None (Preliminary result)   Collection Time: 06/19/16 12:56 PM  Result Value Ref Range Status   Specimen Description BLOOD LEFT HAND  Final   Special Requests BOTTLES DRAWN AEROBIC AND ANAEROBIC 5CC  Final   Culture   Final    NO GROWTH 4 DAYS Performed at [facility]    Report Status PENDING  Incomplete  Urine culture     Status: Abnormal   Collection Time: 06/19/16  1:31 PM  Result Value Ref Range Status   Specimen Description URINE, CLEAN CATCH  Final   Special Requests NONE  Final   Culture (A)  Final    <10,000 COLONIES/mL INSIGNIFICANT GROWTH Performed at [facility]    Report Status 06/20/2016 FINAL  Final  MRSA PCR Screening     Status: None   Collection Time: 06/19/16  5:18 PM  Result Value Ref Range Status   MRSA by PCR NEGATIVE NEGATIVE Final    Comment:        The GeneXpert MRSA Assay (FDA approved for NASAL specimens only), is one component of a comprehensive MRSA colonization surveillance program. It is not intended to diagnose MRSA infection nor to guide or monitor treatment for MRSA infections.   Aerobic/Anaerobic Culture (surgical/deep wound)     Status: None (Preliminary result)   Collection Time: 06/21/16  4:40 PM  Result Value Ref Range Status   Specimen Description LIVER ABSCESS  Final   Special Requests NONE  Final   Gram Stain   Final    ABUNDANT WBC PRESENT, PREDOMINANTLY PMN NO  ORGANISMS SEEN    Culture   Final    NO GROWTH 2 DAYS Performed at [facility]    Report Status PENDING  Incomplete     Studies: Ct Image Guided Drainage Percut Cath  Peritoneal Retroperit  06/21/2016  CLINICAL DATA:  past medical history of hypertension, T 11 fracture who was admitted to the hospital on 7/8 with malaise, fever, intermittent abdominal/substernal discomfort, diminished appetite/oral intake, weakness, jaundice and occasional nausea. Subsequent laboratory evaluation revealed marked leukocytosis, as well as anemia and elevated LFTs. She has received blood transfusion. Current laboratory values reveal WBC of 36.8, hemoglobin 10.2, platelets 339 k, creatinine 0.49, total bilirubin 9.8 , PT 15.5, INR 1.22 . CT of the abdomen and pelvis on 7/8 revealed 2 low attenuation areas in the right hepatic lobe which are new since December 2014 and suspicious for hepatic abscesses and less likely neoplasm. Request now received for CT guided drainage of the above-mentioned abscesses. " EXAM: CT AND ULTRASOUND GUIDED DRAINAGE OF LIVER ABSCESSES ANESTHESIA/SEDATION: Intravenous Fentanyl and Versed were administered as conscious sedation during continuous monitoring of the patient's level of consciousness and physiological / cardiorespiratory status by the radiology RN, with a total moderate sedation time of 25 minutes. PROCEDURE: The procedure, risks, benefits, and alternatives were explained to the patient. Questions regarding the procedure were encouraged and answered. The patient understands and consents to the procedure. The lesions were better localized with ultrasound so survey ultrasound of the level was used to determine appropriate skin entry site. The operative field  was prepped with chlorhexidinein a sterile fashion, and a sterile drape was applied covering the operative field. A sterile gown and sterile gloves were used for the procedure. Local anesthesia was provided with 1% Lidocaine.  Under real-time ultrasound guidance, a 25 gauge trocar needle was advanced through the segment 5 lesion into the segment 7 lesion. Purulent material could be aspirated. An Amplatz guidewire advanced easily. Limited CT was performed to confirm appropriate needle a guidewire positioning. The tract was then dilated to allow placement of a 12 French multi side-hole biliary drain catheter, positioned with the distal sideholes and the segment 7 collection, proximal side holes in the segment 5 collection. A total of 75 mL purulent material were aspirated. A sample was sent for Gram stain, culture and sensitivity. Catheter was secured externally with 0 Prolene suture and StatLock, and placed to gravity drain after flushing. The patient tolerated the procedure well. COMPLICATIONS: None immediate FINDINGS: Two complex right lobe fluid collections were identified on ultrasound corresponding to the lesions seen on prior CT. Multi side hole drain catheter was placed across both lesions as detailed above. IMPRESSION: 1. Technically successful CT-guided drain catheter placement across 2 right lobe hepatic abscesses. Electronically Signed   By: Lucrezia Europe M.D.   On: 06/21/2016 17:30    Scheduled Meds:  Scheduled Meds: . sodium chloride   Intravenous Once  . sodium chloride   Intravenous Once  . antiseptic oral rinse  7 mL Mouth Rinse BID  . conjugated estrogens  1 Applicatorful Vaginal Once per day on Mon Wed Fri  . feeding supplement  1 Container Oral TID BM  . metoprolol succinate  25 mg Oral Daily  . multivitamin with minerals  1 tablet Oral Daily  . piperacillin-tazobactam (ZOSYN)  IV  3.375 g Intravenous Q8H  . polyethylene glycol  17 g Oral BID  . saccharomyces boulardii  250 mg Oral BID  . simethicone  80 mg Oral QID  . sodium chloride flush  3 mL Intravenous Q12H  . vancomycin  500 mg Intravenous Q24H   Continuous Infusions: . sodium chloride 100 mL/hr at 06/22/16 2215    Time spent on care of this  patient: 91 min   [place], MD 06/23/2016, 3:39 PM  LOS: 4 days   [facility] Office  [phone] Pager - Text Page per [email or web address] If 7PM-7AM, please contact night-coverage [email or web address]

## 2016-06-23 NOTE — Progress Notes (Signed)
Patient ID: Brianna [name], female   DOB: [birth year], 82 y.o.   MRN: [id number]    Referring Physician(s): Perry,J  Supervising Physician: Shick,T  Patient Status: inpatient  Chief Complaint:  Hepatic abscesses  Subjective: Pt still having intermittent abd pain/nausea/chest discomfort when coughing; minimal appetite  Allergies: Other; Compazine; Erythromycin; Lyrica; Promethazine; Tramadol; and Vistaril  Medications: Prior to Admission medications   Medication Sig Start Date End Date Taking? Authorizing Provider  acetaminophen (TYLENOL) 500 MG tablet Take 1,000 mg by mouth every 6 (six) hours as needed for mild pain, moderate pain, fever or headache.   Yes Historical Provider, MD  conjugated estrogens (PREMARIN) vaginal cream Place 1 Applicatorful vaginally 3 (three) times a week.   Yes Historical Provider, MD  diphenhydrAMINE (BENADRYL) 25 MG tablet Take 25 mg by mouth at bedtime as needed for sleep.    Yes Historical Provider, MD  famotidine (PEPCID) 10 MG tablet Take 10 mg by mouth daily as needed for heartburn or indigestion.   Yes Historical Provider, MD  ibuprofen (ADVIL,MOTRIN) 200 MG tablet Take 400 mg by mouth every 6 (six) hours as needed for fever, headache, mild pain, moderate pain or cramping.   Yes Historical Provider, MD  Melatonin 5 MG TABS Take 5 mg by mouth at bedtime as needed (sleep).   Yes Historical Provider, MD  metoprolol succinate (TOPROL-XL) 25 MG 24 hr tablet Take 1 tablet (25 mg total) by mouth daily. 12/13/13  Yes Robbie Lis, MD  Multiple Vitamin (MULTIVITAMIN WITH MINERALS) TABS tablet Take 1 tablet by mouth daily.   Yes Historical Provider, MD  ondansetron (ZOFRAN) 8 MG tablet Take 8 mg by mouth every 8 (eight) hours as needed for nausea or vomiting.   Yes Historical Provider, MD  Polyethyl Glycol-Propyl Glycol (SYSTANE OP) Apply 2 drops to eye 3 (three) times daily as needed (dry eyes).   Yes Historical Provider, MD  ibuprofen (ADVIL,MOTRIN) 800 MG  tablet Take 0.5 tablets (400 mg total) by mouth 3 (three) times daily. Patient not taking: Reported on 06/19/2016 06/16/16   [name], MD     Vital Signs: BP 152/63 mmHg  Pulse 103  Temp(Src) 99.5 F (37.5 C) (Axillary)  Resp 26  Ht 5' 3.5" (1.613 m)  Wt 143 lb 4.8 oz (65 kg)  BMI 24.98 kg/m2  SpO2 100%  Physical ExamRUQ drain intact, insertion site ok, output small amt bloody fluid; drain irrigated with saline with little return; clot noted in tubing and removed; cx's pend; cytology neg for malig cells; abd sl dist  Imaging: Ct Abdomen Pelvis W Contrast  06/19/2016  CLINICAL DATA:  Worsening upper abdominal pain and fever. EXAM: CT ABDOMEN AND PELVIS WITH CONTRAST TECHNIQUE: Multidetector CT imaging of the abdomen and pelvis was performed using the standard protocol following bolus administration of intravenous contrast. CONTRAST:  20mL ISOVUE-300 IOPAMIDOL (ISOVUE-300) INJECTION 61% COMPARISON:  CT scan of the chest December 10, 2013 FINDINGS: Small bilateral pleural effusions are identified. There is mild atelectasis in the left base. Streaky opacity posteriorly on the right is likely atelectasis as well. No infiltrate suspicious for pneumonia. No nodule or mass. The lung bases are otherwise unremarkable. No free air identified. There is a small amount of free fluid in the pelvis which is nonspecific. There are 2 masses in the right hepatic lobe with the first seen on series 3, image 25 measuring 4.7 by 4.1 cm and the other seen on image 28, a little smaller. There is no definitive peripheral nodular enhancement associated  with either of these masses, including on delayed images. The masses demonstrate attenuations between 10 and 20 Hounsfield units. These masses are new when compared to the December 10, 2013 study. A few tiny foci of low attenuation in the liver likely represent tiny cysts. Periportal edema is identified. The portal vein is patent. A rounded region of low attenuation is seen  in the pancreatic tail on series 3, image 33 measuring 7.5 mm and too small to characterize. Another smaller similar region is seen on image 33 in the body of the pancreas. No other pancreatic masses. The spleen and adrenal glands are normal. 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 There is a probable cyst in the right kidney on image 51, too small to characterize. No solid masses are seen in the right kidney. Low-attenuation in the left kidney on series 3, image 32 is too small to characterize as well. A larger rounded region of low attenuation in the left kidney on image 34 measures up to 17 mm is not definitively cystic on this study with an attenuation of 28 Hounsfield units. No other suspicious abnormality seen in either kidney. There is atherosclerotic change in the tortuous abdominal aorta with no aneurysm or dissection. No adenopathy. The cholelithiasis and probable polyp in the gallbladder seen on recent ultrasound are not seen on the CT scan. The gallbladder is unremarkable. There is a small hiatal hernia. The stomach is decompressed limiting evaluation but no gastric abnormalities are seen. The stomach and small bowel are unremarkable. There is fecal loading throughout the colon. A region of relative narrowing in the sigmoid colon is thought to be due to peristalsis with no identified colonic mass. The patient is status post appendectomy. No adenopathy is seen in the pelvis. The uterus and adnexae are unremarkable. The bladder is normal. Degenerative changes are seen in the spine. There is anterior wedging of T11 which is new compared to 2014 with approximately 70% loss of height. There is mild anterior wedging of L1 which is age indeterminate but may be chronic. No other bony abnormalities. IMPRESSION: 1. There are 2 low-attenuation masses in the right hepatic lobe new since December 2014. These do not meet the criteria for hemangiomas. The low-attenuation would suggest cystic components.  Recommended MRI for further characterisation. Cystic neoplasms or abscesses are not excluded on this study. 2. Indeterminate rounded low-attenuation measuring 17 mm in the left kidney. This could represent a hyperdense cyst or a solid neoplasm based on this CT scan. Recommend attention to this region on the MRI to exclude an underlying solid neoplasm. Other smaller regions of low attenuation in the kidneys are probably cystic. 3. 2 small regions of low attenuation in the pancreatic body and tail are nonspecific. Recommend attention to these regions on the recommended MRI. These are of low suspicion. 4. Age indeterminate compression fractures of T11 and L1. Recommend clinical correlation. 5. There is a small amount of fluid in the pelvis which is nonspecific. 6. Mild periportal edema, nonspecific. 7. Small bilateral effusions with associated atelectasis. 8. Moderate to severe fecal loading throughout the colon. Electronically Signed   By: Dorise Bullion III M.D   On: 06/19/2016 21:37   Ct Image Guided Drainage Percut Cath  Peritoneal Retroperit  06/21/2016  CLINICAL DATA:  past medical history of hypertension, T 11 fracture who was admitted to the hospital on 7/8 with malaise, fever, intermittent abdominal/substernal discomfort, diminished appetite/oral intake, weakness, jaundice and occasional nausea. Subsequent laboratory evaluation revealed marked leukocytosis, as well  as anemia and elevated LFTs. She has received blood transfusion. Current laboratory values reveal WBC of 36.8, hemoglobin 10.2, platelets 339 k, creatinine 0.49, total bilirubin 9.8 , PT 15.5, INR 1.22 . CT of the abdomen and pelvis on 7/8 revealed 2 low attenuation areas in the right hepatic lobe which are new since December 2014 and suspicious for hepatic abscesses and less likely neoplasm. Request now received for CT guided drainage of the above-mentioned abscesses. " EXAM: CT AND ULTRASOUND GUIDED DRAINAGE OF LIVER ABSCESSES  ANESTHESIA/SEDATION: Intravenous Fentanyl and Versed were administered as conscious sedation during continuous monitoring of the patient's level of consciousness and physiological / cardiorespiratory status by the radiology RN, with a total moderate sedation time of 25 minutes. PROCEDURE: The procedure, risks, benefits, and alternatives were explained to the patient. Questions regarding the procedure were encouraged and answered. The patient understands and consents to the procedure. The lesions were better localized with ultrasound so survey ultrasound of the level was used to determine appropriate skin entry site. The operative field was prepped with chlorhexidinein a sterile fashion, and a sterile drape was applied covering the operative field. A sterile gown and sterile gloves were used for the procedure. Local anesthesia was provided with 1% Lidocaine. Under real-time ultrasound guidance, a 25 gauge trocar needle was advanced through the segment 5 lesion into the segment 7 lesion. Purulent material could be aspirated. An Amplatz guidewire advanced easily. Limited CT was performed to confirm appropriate needle a guidewire positioning. The tract was then dilated to allow placement of a 12 French multi side-hole biliary drain catheter, positioned with the distal sideholes and the segment 7 collection, proximal side holes in the segment 5 collection. A total of 75 mL purulent material were aspirated. A sample was sent for Gram stain, culture and sensitivity. Catheter was secured externally with 0 Prolene suture and StatLock, and placed to gravity drain after flushing. The patient tolerated the procedure well. COMPLICATIONS: None immediate FINDINGS: Two complex right lobe fluid collections were identified on ultrasound corresponding to the lesions seen on prior CT. Multi side hole drain catheter was placed across both lesions as detailed above. IMPRESSION: 1. Technically successful CT-guided drain catheter placement  across 2 right lobe hepatic abscesses. Electronically Signed   By: Lucrezia Europe M.D.   On: 06/21/2016 17:30    Labs:  CBC:  Recent Labs  06/20/16 0231 06/20/16 2157 06/21/16 0508 06/22/16 0316 06/23/16 0423  WBC 28.3*  --  36.8* 30.7* 34.1*  HGB 5.6* 9.5* 10.2* 8.2* 6.7*  HCT 15.4* 27.9* 29.1* 22.9* 18.9*  PLT 333  --  339 413* 479*    COAGS:  Recent Labs  06/20/16 0231 06/20/16 1330  INR 1.31 1.22    BMP:  Recent Labs  06/20/16 1330 06/21/16 0324 06/22/16 0316 06/23/16 0326  NA 128* 127* 126* 128*  K 3.8 4.1 3.3* 4.1  CL 97* 96* 96* 100*  CO2 24 23 22 23   GLUCOSE 122* 109* 125* 123*  BUN 21* 21* 20 23*  CALCIUM 7.5* 8.0* 7.5* 7.4*  CREATININE 0.61 0.49 0.41* 0.43*  GFRNONAA >60 >60 >60 >60  GFRAA >60 >60 >60 >60    LIVER FUNCTION TESTS:  Recent Labs  06/20/16 0231 06/21/16 0324 06/22/16 0316 06/23/16 0326  BILITOT 4.2* 9.8* 13.3* 7.8*  7.8*  AST 81* 99* 72* 44*  ALT 148* 175* 142* 109*  ALKPHOS 135* 243* 235* 206*  PROT 5.2* 5.6* 5.5* 4.6*  ALBUMIN 2.4* 2.6* 2.3* 1.9*    Assessment and  Plan: S/p drainage of hepatic abscesses 7/10; temp 99 ; fluid cx's pend; cytology neg for malig cells; WBC 34.1( 30.7); HGB 6.7(8.2); t bili 7.8(13.3); creat .43; transfuse; OOB to chair; incent spirometry; antbx per ID; would obtain f/u CT A/P with IV contrast (to include lung bases) on 7/14 esp if output cont to be minimal despite irrigation and abd pain not improved; BM bx once infection treated  Electronically Signed: D. Rowe Robert 06/23/2016, 6:14 PM   I spent a total of 20 minutes at the the patient's bedside AND on the patient's hospital floor or unit, greater than 50% of which was counseling/coordinating care for hepatic abscess drain

## 2016-06-23 NOTE — Progress Notes (Signed)
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   Comment- Cold agglutinins are associated with mono, listeria, and mycoplasma infections. I am not clear that she has s/s of these. I would suggest that her liver infection could be the cause of this.   Thank you so much for this interesting consult,   Bobby Rumpf (pager) [phone] [email or web address]  Brianna [name] is an 82 y.o. female.  HPI: 82 yo F with hx of HTN and diverticulitis, who "passed out" 7-5 at home. She was seen in ED and had CT head that was (-). H/H 9.6/28.8. WBC 19.4 She returned to health system on 7-8 with jaundice, dark urine, nausea and anorexia. Her temp was noted to be 103.  H/H 7.2/20.3. WBC 40.0. T bili 7, ALT 216/AST 148.  She was started on vanco/zosyn.  She underwent u/s and was found to have 2 liver lesions. These were seen on CT as well and were felt to be consistent with abscess. She underwent IR guided drain placement on 7-10 showing purulent material. The Cx of this is pending.  Her course has been further complicated by finding cold agglutinins as the cause of her anemia.   Her last colonoscopy was 2-3 yr ago in [place]. She was told not to eat corn, nuts, or seeds.   Past Medical History  Diagnosis Date  . Hypertension   . Pneumonia   . Bladder infection   . Thoracic vertebral fracture ([place])     T 11, unsure type of fracture    Past Surgical History  Procedure Laterality Date  . Appendectomy    . Fracture  surgery       Allergies  Allergen Reactions  . Other Nausea And Vomiting    *All pain medications*  . Compazine [Prochlorperazine] Other (See Comments)    Restless, hallucinations  . Erythromycin     "passed out"  . Lyrica [Pregabalin] Other (See Comments)    Sleep walking  . Promethazine Other (See Comments)    Restless, hallucinations  . Tramadol Nausea And Vomiting  . Vistaril [Hydroxyzine Hcl] Other (See Comments)    Stated she had hallucinations.    Medications:  Scheduled: . sodium chloride   Intravenous Once  . sodium chloride   Intravenous Once  . antiseptic oral rinse  7 mL Mouth Rinse BID  . conjugated estrogens  1 Applicatorful Vaginal Once per day on Mon Wed Fri  . feeding supplement  1 Container Oral TID BM  . metoprolol succinate  25 mg Oral Daily  . multivitamin with minerals  1 tablet Oral Daily  . piperacillin-tazobactam (ZOSYN)  IV  3.375 g Intravenous Q8H  . polyethylene glycol  17 g Oral BID  . saccharomyces boulardii  250 mg Oral BID  . simethicone  80 mg Oral QID  . sodium chloride flush  3 mL Intravenous Q12H  . vancomycin  500 mg Intravenous Q24H    Abtx:  Anti-infectives    Start     Dose/Rate Route Frequency Ordered Stop   06/20/16 1400  vancomycin (  VANCOCIN) 500 mg in sodium chloride 0.9 % 100 mL IVPB     500 mg 100 mL/hr over 60 Minutes Intravenous Every 24 hours 06/19/16 1254     06/19/16 2200  piperacillin-tazobactam (ZOSYN) IVPB 3.375 g     3.375 g 12.5 mL/hr over 240 Minutes Intravenous Every 8 hours 06/19/16 1254     06/19/16 1245  piperacillin-tazobactam (ZOSYN) IVPB 3.375 g     3.375 g 100 mL/hr over 30 Minutes Intravenous  Once 06/19/16 1242 06/19/16 1335   06/19/16 1245  vancomycin (VANCOCIN) IVPB 1000 mg/200 mL premix     1,000 mg 200 mL/hr over 60 Minutes Intravenous  Once 06/19/16 1242 06/19/16 1456      Total days of antibiotics: 3 vanco/zosyn          Social History:  reports that she has never smoked. She has never  used smokeless tobacco. She reports that she drinks alcohol. She reports that she does not use illicit drugs.  Family History  Problem Relation Age of Onset  . Heart disease Mother   . Heart disease Father   . Hypertension Sister   . Stroke Sister   . Cancer Sister     General ROS: denies CP, headaches, SOB, cough, melena, BRBPR, abd pain, fever since adm. +chronic constipation on miralax at home. Has been regular since adm. see HPI.   Blood pressure 147/74, pulse 96, temperature 99.1 F (37.3 C), temperature source Oral, resp. rate 24, height 5' 3.5" (1.613 m), weight 65 kg (143 lb 4.8 oz), SpO2 100 %. General appearance: alert, cooperative, icteric and no distress Eyes: negative findings: pupils equal, round, reactive to light and accomodation Throat: normal findings: oropharynx pink & moist without lesions or evidence of thrush Neck: no adenopathy and supple, symmetrical, trachea midline Lungs: clear to auscultation bilaterally Heart: regular rate and rhythm Abdomen: normal findings: bowel sounds normal and soft, non-tender Extremities: edema 1-2+ at ankles.    Results for orders placed or performed during the hospital encounter of 06/19/16 (from the past 48 hour(s))  Aerobic/Anaerobic Culture (surgical/deep wound)     Status: None (Preliminary result)   Collection Time: 06/21/16  4:40 PM  Result Value Ref Range   Specimen Description LIVER ABSCESS    Special Requests NONE    Gram Stain      ABUNDANT WBC PRESENT, PREDOMINANTLY PMN NO ORGANISMS SEEN    Culture      NO GROWTH 2 DAYS Performed at [facility]    Report Status PENDING   Comprehensive metabolic panel     Status: Abnormal   Collection Time: 06/22/16  3:16 AM  Result Value Ref Range   Sodium 126 (L) 135 - 145 mmol/L   Potassium 3.3 (L) 3.5 - 5.1 mmol/L    Comment: DELTA CHECK NOTED REPEATED TO VERIFY    Chloride 96 (L) 101 - 111 mmol/L   CO2 22 22 - 32 mmol/L   Glucose, Bld 125 (H) 65 - 99 mg/dL     BUN 20 6 - 20 mg/dL   Creatinine, Ser 0.41 (L) 0.44 - 1.00 mg/dL   Calcium 7.5 (L) 8.9 - 10.3 mg/dL   Total Protein 5.5 (L) 6.5 - 8.1 g/dL   Albumin 2.3 (L) 3.5 - 5.0 g/dL   AST 72 (H) 15 - 41 U/L   ALT 142 (H) 14 - 54 U/L   Alkaline Phosphatase 235 (H) 38 - 126 U/L   Total Bilirubin 13.3 (H) 0.3 - 1.2 mg/dL   GFR  calc non Af Amer >60 >60 mL/min   GFR calc Af Amer >60 >60 mL/min    Comment: (NOTE) The eGFR has been calculated using the CKD EPI equation. This calculation has not been validated in all clinical situations. eGFR's persistently <60 mL/min signify possible Chronic Kidney Disease.    Anion gap 8 5 - 15  CBC     Status: Abnormal   Collection Time: 06/22/16  3:16 AM  Result Value Ref Range   WBC 30.7 (H) 4.0 - 10.5 K/uL   RBC 2.60 (L) 3.87 - 5.11 MIL/uL   Hemoglobin 8.2 (L) 12.0 - 15.0 g/dL   HCT 22.9 (L) 36.0 - 46.0 %   MCV 88.1 78.0 - 100.0 fL   MCH 31.5 26.0 - 34.0 pg   MCHC 35.8 30.0 - 36.0 g/dL    Comment: CORRECTED FOR COLD AGGLUTININS PRE-WARMING TECHNIQUE USED    RDW 15.2 11.5 - 15.5 %   Platelets 413 (H) 150 - 400 K/uL  Sodium, urine, random     Status: None   Collection Time: 06/22/16 10:58 AM  Result Value Ref Range   Sodium, Ur <10 mmol/L    Comment: REPEATED TO VERIFY Performed at [facility]   Osmolality, urine     Status: None   Collection Time: 06/22/16 10:58 AM  Result Value Ref Range   Osmolality, Ur 515 300 - 900 mOsm/kg    Comment: Performed at [facility]  Osmolality     Status: None   Collection Time: 06/23/16  3:26 AM  Result Value Ref Range   Osmolality 275 275 - 295 mOsm/kg    Comment: Performed at [facility]  Bilirubin, fractionated(tot/dir/indir)     Status: Abnormal   Collection Time: 06/23/16  3:26 AM  Result Value Ref Range   Total Bilirubin 7.8 (H) 0.3 - 1.2 mg/dL   Bilirubin, Direct 3.5 (H) 0.1 - 0.5 mg/dL   Indirect Bilirubin 4.3 (H) 0.3 - 0.9 mg/dL  Reticulocytes     Status: Abnormal    Collection Time: 06/23/16  3:26 AM  Result Value Ref Range   Retic Ct Pct 2.5 0.4 - 3.1 %    Comment: HEMOGLOBIN IS 6.7 I CALLED A KORACZK RN [id number] 06/23/16 A NAVARRO   RBC. 2.16 (L) 3.87 - 5.11 MIL/uL   Retic Count, Manual 54.0 19.0 - 186.0 K/uL  Lactate dehydrogenase     Status: Abnormal   Collection Time: 06/23/16  3:26 AM  Result Value Ref Range   LDH 224 (H) 98 - 192 U/L  Comprehensive metabolic panel     Status: Abnormal   Collection Time: 06/23/16  3:26 AM  Result Value Ref Range   Sodium 128 (L) 135 - 145 mmol/L   Potassium 4.1 3.5 - 5.1 mmol/L    Comment: DELTA CHECK NOTED   Chloride 100 (L) 101 - 111 mmol/L   CO2 23 22 - 32 mmol/L   Glucose, Bld 123 (H) 65 - 99 mg/dL   BUN 23 (H) 6 - 20 mg/dL   Creatinine, Ser 0.43 (L) 0.44 - 1.00 mg/dL   Calcium 7.4 (L) 8.9 - 10.3 mg/dL   Total Protein 4.6 (L) 6.5 - 8.1 g/dL   Albumin 1.9 (L) 3.5 - 5.0 g/dL   AST 44 (H) 15 - 41 U/L   ALT 109 (H) 14 - 54 U/L   Alkaline Phosphatase 206 (H) 38 - 126 U/L   Total Bilirubin 7.8 (H) 0.3 - 1.2 mg/dL    Comment:  DELTA CHECK NOTED   GFR calc non Af Amer >60 >60 mL/min   GFR calc Af Amer >60 >60 mL/min    Comment: (NOTE) The eGFR has been calculated using the CKD EPI equation. This calculation has not been validated in all clinical situations. eGFR's persistently <60 mL/min signify possible Chronic Kidney Disease.    Anion gap 5 5 - 15  Lactic acid, plasma     Status: None   Collection Time: 06/23/16  3:26 AM  Result Value Ref Range   Lactic Acid, Venous 0.7 0.5 - 1.9 mmol/L  CBC     Status: Abnormal   Collection Time: 06/23/16  4:23 AM  Result Value Ref Range   WBC 34.1 (H) 4.0 - 10.5 K/uL   RBC 2.16 (L) 3.87 - 5.11 MIL/uL   Hemoglobin 6.7 (LL) 12.0 - 15.0 g/dL    Comment: REPEATED TO VERIFY CRITICAL RESULT CALLED TO, READ BACK BY AND VERIFIED WITH: A KORACZK RN [id number] 06/23/16 A NAVARRO    HCT 18.9 (L) 36.0 - 46.0 %   MCV 87.5 78.0 - 100.0 fL   MCH 31.0 26.0 - 34.0 pg   MCHC 35.4  30.0 - 36.0 g/dL    Comment: CORRECTED FOR COLD AGGLUTININS PRE-WARMING TECHNIQUE USED    RDW 15.2 11.5 - 15.5 %   Platelets 479 (H) 150 - 400 K/uL  Type and screen [place]     Status: None (Preliminary result)   Collection Time: 06/23/16  5:31 AM  Result Value Ref Range   ABO/RH(D) A POS    Antibody Screen POS    Sample Expiration 06/26/2016    DAT, IgG POS    Antibody Identification NON SPECIFIC COLD ANTIBODY    Antibody ID,T [place] NO SPECIFIC ANTIBODY DEMONSTRATED IN [place]    Unit Number [id number]    Blood Component Type RED CELLS,LR    Unit division 00    Status of Unit ISSUED    Transfusion Status OK TO TRANSFUSE    Crossmatch Result COMPATIBLE    Unit Number [id number]    Blood Component Type RBC LR PHER1    Unit division 00    Status of Unit ALLOCATED    Transfusion Status OK TO TRANSFUSE    Crossmatch Result COMPATIBLE   Prepare RBC     Status: None   Collection Time: 06/23/16  8:00 AM  Result Value Ref Range   Order Confirmation ORDER PROCESSED BY BLOOD BANK       Component Value Date/Time   SDES LIVER ABSCESS 06/21/2016 1640   SPECREQUEST NONE 06/21/2016 1640   CULT  06/21/2016 1640    NO GROWTH 2 DAYS Performed at [facility] PENDING 06/21/2016 1640   Ct Image Guided Drainage Percut Cath  Peritoneal Retroperit  06/21/2016  CLINICAL DATA:  past medical history of hypertension, T 11 fracture who was admitted to the hospital on 7/8 with malaise, fever, intermittent abdominal/substernal discomfort, diminished appetite/oral intake, weakness, jaundice and occasional nausea. Subsequent laboratory evaluation revealed marked leukocytosis, as well as anemia and elevated LFTs. She has received blood transfusion. Current laboratory values reveal WBC of 36.8, hemoglobin 10.2, platelets 339 k, creatinine 0.49, total bilirubin 9.8 , PT 15.5, INR 1.22 . CT of the abdomen and pelvis on 7/8 revealed 2 low attenuation areas in  the right hepatic lobe which are new since December 2014 and suspicious for hepatic abscesses and less likely neoplasm. Request now received for CT guided drainage of the  above-mentioned abscesses. " EXAM: CT AND ULTRASOUND GUIDED DRAINAGE OF LIVER ABSCESSES ANESTHESIA/SEDATION: Intravenous Fentanyl and Versed were administered as conscious sedation during continuous monitoring of the patient's level of consciousness and physiological / cardiorespiratory status by the radiology RN, with a total moderate sedation time of 25 minutes. PROCEDURE: The procedure, risks, benefits, and alternatives were explained to the patient. Questions regarding the procedure were encouraged and answered. The patient understands and consents to the procedure. The lesions were better localized with ultrasound so survey ultrasound of the level was used to determine appropriate skin entry site. The operative field was prepped with chlorhexidinein a sterile fashion, and a sterile drape was applied covering the operative field. A sterile gown and sterile gloves were used for the procedure. Local anesthesia was provided with 1% Lidocaine. Under real-time ultrasound guidance, a 25 gauge trocar needle was advanced through the segment 5 lesion into the segment 7 lesion. Purulent material could be aspirated. An Amplatz guidewire advanced easily. Limited CT was performed to confirm appropriate needle a guidewire positioning. The tract was then dilated to allow placement of a 12 French multi side-hole biliary drain catheter, positioned with the distal sideholes and the segment 7 collection, proximal side holes in the segment 5 collection. A total of 75 mL purulent material were aspirated. A sample was sent for Gram stain, culture and sensitivity. Catheter was secured externally with 0 Prolene suture and StatLock, and placed to gravity drain after flushing. The patient tolerated the procedure well. COMPLICATIONS: None immediate FINDINGS: Two complex  right lobe fluid collections were identified on ultrasound corresponding to the lesions seen on prior CT. Multi side hole drain catheter was placed across both lesions as detailed above. IMPRESSION: 1. Technically successful CT-guided drain catheter placement across 2 right lobe hepatic abscesses. Electronically Signed   By: Lucrezia Europe M.D.   On: 06/21/2016 17:30   Recent Results (from the past 240 hour(s))  Culture, blood (Routine X 2)     Status: None (Preliminary result)   Collection Time: 06/19/16 12:56 PM  Result Value Ref Range Status   Specimen Description BLOOD RIGHT ARM  Final   Special Requests BOTTLES DRAWN AEROBIC AND ANAEROBIC 5CC  Final   Culture   Final    NO GROWTH 4 DAYS Performed at [facility]    Report Status PENDING  Incomplete  Culture, blood (Routine X 2)     Status: None (Preliminary result)   Collection Time: 06/19/16 12:56 PM  Result Value Ref Range Status   Specimen Description BLOOD LEFT HAND  Final   Special Requests BOTTLES DRAWN AEROBIC AND ANAEROBIC 5CC  Final   Culture   Final    NO GROWTH 4 DAYS Performed at [facility]    Report Status PENDING  Incomplete  Urine culture     Status: Abnormal   Collection Time: 06/19/16  1:31 PM  Result Value Ref Range Status   Specimen Description URINE, CLEAN CATCH  Final   Special Requests NONE  Final   Culture (A)  Final    <10,000 COLONIES/mL INSIGNIFICANT GROWTH Performed at [facility]    Report Status 06/20/2016 FINAL  Final  MRSA PCR Screening     Status: None   Collection Time: 06/19/16  5:18 PM  Result Value Ref Range Status   MRSA by PCR NEGATIVE NEGATIVE Final    Comment:        The GeneXpert MRSA Assay (FDA approved for NASAL specimens only), is one component of a  comprehensive MRSA colonization surveillance program. It is not intended to diagnose MRSA infection nor to guide or monitor treatment for MRSA infections.   Aerobic/Anaerobic Culture (surgical/deep wound)      Status: None (Preliminary result)   Collection Time: 06/21/16  4:40 PM  Result Value Ref Range Status   Specimen Description LIVER ABSCESS  Final   Special Requests NONE  Final   Gram Stain   Final    ABUNDANT WBC PRESENT, PREDOMINANTLY PMN NO ORGANISMS SEEN    Culture   Final    NO GROWTH 2 DAYS Performed at [facility]    Report Status PENDING  Incomplete      06/23/2016, 3:09 PM     LOS: 4 days    Records and images were personally reviewed where available.

## 2016-06-23 NOTE — Progress Notes (Signed)
Awaiting a phone call from ED charge nurse about the availability of a blood warmer to start blood transfusion.

## 2016-06-23 NOTE — Progress Notes (Signed)
CRITICAL VALUE ALERT  Critical value received:  Hgb 6.7  Date of notification:  06/23/16  Time of notification:  0423  Critical value read back:Yes.    Nurse who received alert:  Reche Dixon  MD notified (1st page):  Tylene Fantasia NP  Time of first page:  0425  MD notified (2nd page):  Time of second page:  Responding MD:  Tylene Fantasia NP  Time MD responded:  [phone]

## 2016-06-24 ENCOUNTER — Inpatient Hospital Stay (HOSPITAL_COMMUNITY): Payer: Medicare Other

## 2016-06-24 DIAGNOSIS — E440 Moderate protein-calorie malnutrition: Secondary | ICD-10-CM | POA: Insufficient documentation

## 2016-06-24 DIAGNOSIS — R1032 Left lower quadrant pain: Secondary | ICD-10-CM

## 2016-06-24 DIAGNOSIS — K750 Abscess of liver: Secondary | ICD-10-CM | POA: Insufficient documentation

## 2016-06-24 DIAGNOSIS — R945 Abnormal results of liver function studies: Secondary | ICD-10-CM

## 2016-06-24 DIAGNOSIS — R7989 Other specified abnormal findings of blood chemistry: Secondary | ICD-10-CM | POA: Insufficient documentation

## 2016-06-24 DIAGNOSIS — E46 Unspecified protein-calorie malnutrition: Secondary | ICD-10-CM

## 2016-06-24 DIAGNOSIS — E8770 Fluid overload, unspecified: Secondary | ICD-10-CM

## 2016-06-24 DIAGNOSIS — R3911 Hesitancy of micturition: Secondary | ICD-10-CM

## 2016-06-24 DIAGNOSIS — R0902 Hypoxemia: Secondary | ICD-10-CM | POA: Insufficient documentation

## 2016-06-24 LAB — COMPREHENSIVE METABOLIC PANEL
ALK PHOS: 238 U/L — AB (ref 38–126)
ALT: 89 U/L — AB (ref 14–54)
AST: 49 U/L — ABNORMAL HIGH (ref 15–41)
Albumin: 2.1 g/dL — ABNORMAL LOW (ref 3.5–5.0)
Anion gap: 6 (ref 5–15)
BUN: 19 mg/dL (ref 6–20)
CALCIUM: 7.4 mg/dL — AB (ref 8.9–10.3)
CO2: 22 mmol/L (ref 22–32)
CREATININE: 0.41 mg/dL — AB (ref 0.44–1.00)
Chloride: 99 mmol/L — ABNORMAL LOW (ref 101–111)
Glucose, Bld: 119 mg/dL — ABNORMAL HIGH (ref 65–99)
Potassium: 4.7 mmol/L (ref 3.5–5.1)
Sodium: 127 mmol/L — ABNORMAL LOW (ref 135–145)
Total Bilirubin: 5.6 mg/dL — ABNORMAL HIGH (ref 0.3–1.2)
Total Protein: 5.2 g/dL — ABNORMAL LOW (ref 6.5–8.1)

## 2016-06-24 LAB — CBC
HCT: 18.9 % — ABNORMAL LOW (ref 36.0–46.0)
HCT: 25.1 % — ABNORMAL LOW (ref 36.0–46.0)
HEMOGLOBIN: 9.1 g/dL — AB (ref 12.0–15.0)
Hemoglobin: 6.7 g/dL — CL (ref 12.0–15.0)
MCH: 31 pg (ref 26.0–34.0)
MCH: 32.4 pg (ref 26.0–34.0)
MCHC: 35.4 g/dL (ref 30.0–36.0)
MCHC: 36.3 g/dL — ABNORMAL HIGH (ref 30.0–36.0)
MCV: 87.5 fL (ref 78.0–100.0)
MCV: 89.3 fL (ref 78.0–100.0)
PLATELETS: 479 10*3/uL — AB (ref 150–400)
Platelets: 506 10*3/uL — ABNORMAL HIGH (ref 150–400)
RBC: 2.16 MIL/uL — ABNORMAL LOW (ref 3.87–5.11)
RBC: 2.81 MIL/uL — AB (ref 3.87–5.11)
RDW: 15.2 % (ref 11.5–15.5)
RDW: 15.5 % (ref 11.5–15.5)
WBC: 34.1 10*3/uL — AB (ref 4.0–10.5)
WBC: 49.7 10*3/uL — ABNORMAL HIGH (ref 4.0–10.5)

## 2016-06-24 LAB — CULTURE, BLOOD (ROUTINE X 2)
Culture: NO GROWTH
Culture: NO GROWTH

## 2016-06-24 LAB — TYPE AND SCREEN
ABO/RH(D): A POS
ANTIBODY SCREEN: POSITIVE
DAT, IgG: POSITIVE
UNIT DIVISION: 0
UNIT DIVISION: 0

## 2016-06-24 MED ORDER — ENSURE ENLIVE PO LIQD
237.0000 mL | Freq: Three times a day (TID) | ORAL | Status: DC
  Administered 2016-06-24 – 2016-06-28 (×7): 237 mL via ORAL

## 2016-06-24 MED ORDER — FUROSEMIDE 10 MG/ML IJ SOLN
20.0000 mg | Freq: Two times a day (BID) | INTRAMUSCULAR | Status: AC
  Administered 2016-06-24 – 2016-06-25 (×3): 20 mg via INTRAVENOUS
  Filled 2016-06-24 (×3): qty 2

## 2016-06-24 MED ORDER — FOLIC ACID 1 MG PO TABS
1.0000 mg | ORAL_TABLET | Freq: Every day | ORAL | Status: DC
  Administered 2016-06-24 – 2016-07-06 (×12): 1 mg via ORAL
  Filled 2016-06-24 (×13): qty 1

## 2016-06-24 MED ORDER — PRO-STAT SUGAR FREE PO LIQD
30.0000 mL | Freq: Two times a day (BID) | ORAL | Status: DC
  Administered 2016-06-24 – 2016-06-29 (×4): 30 mL via ORAL
  Filled 2016-06-24 (×7): qty 30

## 2016-06-24 NOTE — Progress Notes (Addendum)
[facility] Progress Note  Chief Complaint: hepatic abscess  Subjective History: This patient's daughters asked me to re-visit their mother because she was concerned about the source of infection. Patient had scattered abd pain and over drain site. No fever. Denies bloody stools. She was having some LLQ pain a couple of weeks ago after eating some corn. ID consult reviewed - much appreciated.  ROS: Cardiovascular:  no chest pain Respiratory: no dyspnea  Objective:  Med list reviewed  Vital signs in last 24 hrs: Filed Vitals:   06/24/16 1200 06/24/16 1300  BP: 137/62 133/53  Pulse: 93 93  Temp: 98.1 F (36.7 C)   Resp: 31 21    Physical Exam   HEENT: sclera anicteric, oral mucosa moist without lesions  Neck: supple, no thyromegaly, JVD or lymphadenopathy  Cardiac: RRR without murmurs, S1S2 heard, no peripheral edema  Pulm: clear to auscultation bilaterally, normal RR and effort noted  Abdomen: soft, mild LLQ and RUQ tenderness, with active bowel sounds. No guarding or palpable hepatosplenomegaly.  Small amount of serosanguinous drainage in bag  Skin; warm and dry, +  jaundice , no rash  Recent Labs:   Recent Labs Lab 06/22/16 0316 06/23/16 0423 06/24/16 0342  WBC 30.7* 34.1* 49.7*  HGB 8.2* 6.7* 9.1*  HCT 22.9* 18.9* 25.1*  PLT 413* 479* 506*    Recent Labs Lab 06/24/16 0342  NA 127*  K 4.7  CL 99*  CO2 22  BUN 19  ALBUMIN 2.1*  ALKPHOS 238*  ALT 89*  AST 49*  GLUCOSE 119*    Recent Labs Lab 06/20/16 1330  INR 1.22   Urine, blood and abscess cultures have not yet grown out any organisms   @ASSESSMENTPLANBEGIN @ Assessment: Hepatic abscesses - source unclear.  The rising WBC is worrisome. There was no biliary obstruction on initial CT scan.  Sounds like another CT scan is planned for tomorrow to see if any progress has been made draining the abscesses.  Elevated LFTs - appear to be from liver abscesses and probably  hemolysis.   Plan: She does not have an obvious intra abdominal source of this infection, and she is covered with broad-spectrum Abx. Repeat imaging seems like a good idea.  I will sign off, as I do not think I can offer anything else at this juncture.  Feel free to call as the need arises.  Total 25 minutes , over half spent in discussion with patient and daughters. Nelida Meuse III Pager [phone] Mon-Fri 8a-5p [phone] after 5p, weekends, holidays

## 2016-06-24 NOTE — Progress Notes (Signed)
[facility] Progress Note   Brianna [name]  [id number]  DOB: [birth year]  DOA: 06/19/2016 PCP: Tawanna Solo, MD  Brief narrative: Brianna [name] is a 82 y.o. female with HTN, cold agglutinin disease presenting with temp of 103, jaundice and dark urine and weight loss of about 10 lbs. She had been feeling bad since 7/5 and was seen in the ER for syncope and discharged home. She returned to there ER after she was noted to be jaundiced. She was found to have elevated LFTS, WBC count of 40 and a CT scan showing right lobe hepatic abscesses. She also had a sodium of 125 and potassium of 5.7. Tylenol level was normal.    Subjective: Daughter concerned that pulse ox drops when patient get out of bed. No new complaints. Abdomen is still distended and tender.   Assessment/Plan: Principal Problem:   Hepatic abscess/Sepsis/ Elevated LFTs - Vanc and Zosyn - drain placed on 7/10 per IR - f/u cultures - ID consulted- antibiotics per ID - plan for repeat CT tomorrow AM   Active Problems:  Anemia- h/o cold agglutinin disease - Hb dropped from 9 to 5.6 on 7/9- transfused 3 U PRBC - Hb again dropped to 6.7 on 7/12-   no signs of bleeding- transfused 2 U PRBC - cold agglutinin titer > 1: 4096 - last LDH and haptoglobin earlier in on the admission were not consistent with hemolysis  - oncology assisting with management- questioning underlying proliferative disorder  Hyponatremia - U sodium < 10   - due to dehydration, hypoalbuminemia and third spacing - unfortunately, she is third spacing most of the IV fluid given - stop IVF and start Lasix today  Hypoalbuminemia/ severe protein calorie malnutrition/ anasarca  - due to abscesses and poor appetite - Boost is causing heartburn - change to Ensure - add Prostat  Hypoxia on exertion - - pulm edema due to third spacing and fluid overload - atelectasis due to being bed bound - start Lasix - IS  Abdominal pain - pain more gas  like as it is sharp and in right upper abdomen- abdomen is distended and tympatic - Gas x      Benign essential HTN   - Metoprolol    Hyperkalemia - cause? - resolved    Antibiotics: Anti-infectives    Start     Dose/Rate Route Frequency Ordered Stop   06/20/16 1400  vancomycin (VANCOCIN) 500 mg in sodium chloride 0.9 % 100 mL IVPB  Status:  Discontinued     500 mg 100 mL/hr over 60 Minutes Intravenous Every 24 hours 06/19/16 1254 06/23/16 1550   06/19/16 2200  piperacillin-tazobactam (ZOSYN) IVPB 3.375 g     3.375 g 12.5 mL/hr over 240 Minutes Intravenous Every 8 hours 06/19/16 1254     06/19/16 1245  piperacillin-tazobactam (ZOSYN) IVPB 3.375 g     3.375 g 100 mL/hr over 30 Minutes Intravenous  Once 06/19/16 1242 06/19/16 1335   06/19/16 1245  vancomycin (VANCOCIN) IVPB 1000 mg/200 mL premix     1,000 mg 200 mL/hr over 60 Minutes Intravenous  Once 06/19/16 1242 06/19/16 1456     Code Status:     Code Status Orders        Start     Ordered   06/19/16 1721  Do not attempt resuscitation (DNR)   Continuous    Question Answer Comment  In the event of cardiac or respiratory ARREST Do not call a "code blue"   In the event of cardiac  or respiratory ARREST Do not perform Intubation, CPR, defibrillation or ACLS   In the event of cardiac or respiratory ARREST Use medication by any route, position, wound care, and other measures to relive pain and suffering. May use oxygen, suction and manual treatment of airway obstruction as needed for comfort.      06/19/16 1720    Code Status History    Date Active Date Inactive Code Status Order ID Comments User Context   12/10/2013  5:39 AM 12/13/2013  2:12 PM Full Code [id number]  Etta Quill, DO ED     Family Communication: daughters Disposition Plan: to be determined- cont to follow in SDU DVT prophylaxis: SCDS Consultants: hematology, GI, ID Procedures: RUQ drain    Objective: Filed Weights   06/19/16 1255 06/21/16 0500  06/22/16 0600  Weight: 52.164 kg (115 lb) 61.8 kg (136 lb 3.9 oz) 65 kg (143 lb 4.8 oz)    Intake/Output Summary (Last 24 hours) at 06/24/16 1419 Last data filed at 06/24/16 1410  Gross per 24 hour  Intake 2668.33 ml  Output   2350 ml  Net 318.33 ml     Vitals Filed Vitals:   06/24/16 1000 06/24/16 1100 06/24/16 1200 06/24/16 1300  BP: 145/55 149/51 137/62 133/53  Pulse: 103 94 93 93  Temp:   98.1 F (36.7 C)   TempSrc:   Oral   Resp: 22 16 31 21   Height:      Weight:      SpO2: 96% 100% 96% 100%    Exam:  General:  Pt is alert, not in acute distress  HEENT: +++ icterus, No thrush, oral mucosa moist  Cardiovascular: regular rate and rhythm, S1/S2 No murmur  Respiratory: clear to auscultation bilaterally   Abdomen: Soft, +Bowel sounds,   Tender in LUQ and RUQ, tympanic/  distended, no guarding  MSK: No cyanosis or clubbing- mild pedal edema   Data Reviewed: Basic Metabolic Panel:  Recent Labs Lab 06/20/16 1330 06/21/16 0324 06/22/16 0316 06/23/16 0326 06/24/16 0342  NA 128* 127* 126* 128* 127*  K 3.8 4.1 3.3* 4.1 4.7  CL 97* 96* 96* 100* 99*  CO2 24 23 22 23 22   GLUCOSE 122* 109* 125* 123* 119*  BUN 21* 21* 20 23* 19  CREATININE 0.61 0.49 0.41* 0.43* 0.41*  CALCIUM 7.5* 8.0* 7.5* 7.4* 7.4*   Liver Function Tests:  Recent Labs Lab 06/20/16 0231 06/21/16 0324 06/22/16 0316 06/23/16 0326 06/24/16 0342  AST 81* 99* 72* 44* 49*  ALT 148* 175* 142* 109* 89*  ALKPHOS 135* 243* 235* 206* 238*  BILITOT 4.2* 9.8* 13.3* 7.8*  7.8* 5.6*  PROT 5.2* 5.6* 5.5* 4.6* 5.2*  ALBUMIN 2.4* 2.6* 2.3* 1.9* 2.1*   No results for input(s): LIPASE, AMYLASE in the last 168 hours. No results for input(s): AMMONIA in the last 168 hours. CBC:  Recent Labs Lab 06/19/16 1256 06/20/16 0231 06/20/16 2157 06/21/16 0508 06/22/16 0316 06/23/16 0423 06/24/16 0342  WBC 40.0* 28.3*  --  36.8* 30.7* 34.1* 49.7*  NEUTROABS 37.4*  --   --   --   --   --   --   HGB 7.2*  5.6* 9.5* 10.2* 8.2* 6.7* 9.1*  HCT 20.3* 15.4* 27.9* 29.1* 22.9* 18.9* 25.1*  MCV 92.7 91.7  --  89.8 88.1 87.5 89.3  PLT 456* 333  --  339 413* 479* 506*   Cardiac Enzymes: No results for input(s): CKTOTAL, CKMB, CKMBINDEX, TROPONINI in the last 168 hours. BNP (last  3 results) No results for input(s): BNP in the last 8760 hours.  ProBNP (last 3 results) No results for input(s): PROBNP in the last 8760 hours.  CBG: No results for input(s): GLUCAP in the last 168 hours.  Recent Results (from the past 240 hour(s))  Culture, blood (Routine X 2)     Status: None (Preliminary result)   Collection Time: 06/19/16 12:56 PM  Result Value Ref Range Status   Specimen Description BLOOD RIGHT ARM  Final   Special Requests BOTTLES DRAWN AEROBIC AND ANAEROBIC 5CC  Final   Culture   Final    NO GROWTH 4 DAYS Performed at [facility]    Report Status PENDING  Incomplete  Culture, blood (Routine X 2)     Status: None (Preliminary result)   Collection Time: 06/19/16 12:56 PM  Result Value Ref Range Status   Specimen Description BLOOD LEFT HAND  Final   Special Requests BOTTLES DRAWN AEROBIC AND ANAEROBIC 5CC  Final   Culture   Final    NO GROWTH 4 DAYS Performed at [facility]    Report Status PENDING  Incomplete  Urine culture     Status: Abnormal   Collection Time: 06/19/16  1:31 PM  Result Value Ref Range Status   Specimen Description URINE, CLEAN CATCH  Final   Special Requests NONE  Final   Culture (A)  Final    <10,000 COLONIES/mL INSIGNIFICANT GROWTH Performed at [facility]    Report Status 06/20/2016 FINAL  Final  MRSA PCR Screening     Status: None   Collection Time: 06/19/16  5:18 PM  Result Value Ref Range Status   MRSA by PCR NEGATIVE NEGATIVE Final    Comment:        The GeneXpert MRSA Assay (FDA approved for NASAL specimens only), is one component of a comprehensive MRSA colonization surveillance program. It is not intended to diagnose  MRSA infection nor to guide or monitor treatment for MRSA infections.   Aerobic/Anaerobic Culture (surgical/deep wound)     Status: None (Preliminary result)   Collection Time: 06/21/16  4:40 PM  Result Value Ref Range Status   Specimen Description LIVER ABSCESS  Final   Special Requests NONE  Final   Gram Stain   Final    ABUNDANT WBC PRESENT, PREDOMINANTLY PMN NO ORGANISMS SEEN    Culture   Final    NO GROWTH 3 DAYS Performed at [facility]    Report Status PENDING  Incomplete     Studies: Dg Chest Port 1 View  06/24/2016  CLINICAL DATA:  Shortness of breath today. EXAM: PORTABLE CHEST 1 VIEW COMPARISON:  06/19/2016. FINDINGS: 0950 hours. Patient is rotated to the right. Low volume film with bibasilar atelectasis or infiltrate and small bilateral pleural effusions. The cardio pericardial silhouette is enlarged. Bones are diffusely demineralized. Telemetry leads overlie the chest. Pigtail catheter over the right upper quadrant is compatible with recent hepatic abscess drainage. IMPRESSION: Rotated film with vascular congestion and bibasilar atelectasis or infiltrate. Small bilateral pleural effusions. Electronically Signed   By: Misty Stanley M.D.   On: 06/24/2016 10:10    Scheduled Meds:  Scheduled Meds: . sodium chloride   Intravenous Once  . sodium chloride   Intravenous Once  . antiseptic oral rinse  7 mL Mouth Rinse BID  . conjugated estrogens  1 Applicatorful Vaginal Once per day on Mon Wed Fri  . feeding supplement (ENSURE ENLIVE)  237 mL Oral TID BM  .  feeding supplement (PRO-STAT SUGAR FREE 64)  30 mL Oral BID  . folic acid  1 mg Oral Daily  . furosemide  20 mg Intravenous Q12H  . metoprolol succinate  25 mg Oral Daily  . multivitamin with minerals  1 tablet Oral Daily  . piperacillin-tazobactam (ZOSYN)  IV  3.375 g Intravenous Q8H  . polyethylene glycol  17 g Oral BID  . saccharomyces boulardii  250 mg Oral BID  . simethicone  80 mg Oral QID  . sodium  chloride flush  3 mL Intravenous Q12H   Continuous Infusions:    Time spent on care of this patient: 35 min   [place], MD 06/24/2016, 2:19 PM  LOS: 5 days   [facility] Office  [phone] Pager - Text Page per [email or web address] If 7PM-7AM, please contact night-coverage [email or web address]

## 2016-06-24 NOTE — Progress Notes (Signed)
IP PROGRESS NOTE  Subjective:   She reports feeling better, she continues to have right upper abdominal pain. She reports edema in the arms  Objective: Vital signs in last 24 hours: Blood pressure 134/60, pulse 85, temperature 97.6 F (36.4 C), temperature source Oral, resp. rate 21, height 5' 3.5" (1.613 m), weight 143 lb 4.8 oz (65 kg), SpO2 98 %.  Intake/Output from previous day: 07/12 0701 - 07/13 0700 In: 3360.8 [P.O.:120; I.V.:2300; Blood:785.8; IV Piggyback:150] Out: 1400 [Urine:1350; Drains:50]  Physical Exam:  Abdomen: No hepatomegaly, right upper quadrant drain with a gauze dressing, tender in the right subcostal region, no mass, the abdomen is mildly distended Vascular: Pitting edema at the arms and lower legs/feet   Lab Results:  Recent Labs  06/23/16 0423 06/24/16 0342  WBC 34.1* 49.7*  HGB 6.7* 9.1*  HCT 18.9* 25.1*  PLT 479* 506*    BMET  Recent Labs  06/23/16 0326 06/24/16 0342  NA 128* 127*  K 4.1 4.7  CL 100* 99*  CO2 23 22  GLUCOSE 123* 119*  BUN 23* 19  CREATININE 0.43* 0.41*  CALCIUM 7.4* 7.4*  Alkaline phosphatase 238, AST 49, ALT 89, bilirubin 5.6   06/21/2016: IgM 1351, kappa free light chains 28, 0.8 mg/dL monoclonal protein, IgM kappa  Medications: I have reviewed the patient's current medications.  Assessment/Plan:   1. Fever, Abnormal liver enzymes, hyperbilirubinemia, hypodense liver lesions noted on CT 06/19/2016  Status post placement of a liver drain on 06/21/2010 3. 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 High titer cold agglutinin, serum monoclonal IgM kappa protein  The hemoglobin has again responded to a red cell transfusion. The bilirubin is better today. I suspect she has an underlying cold agglutinin with exacerbation of anemia secondary to the acute infection.. She could have an underlying lymphoproliferative disorder, such as [place]'s macroglobulinemia. We will plan for a  diagnostic bone marrow biopsy when she has recovered from the acute infection.    Recommendations: 1. Continue antibiotics per infectious disease 2. Transfuse packed red blood cells as needed for symptomatic anemia 3. Begin folic acid  I will be out until 06/28/2016. Dr. Irene Limbo will follow her beginning 06/25/2016.   LOS: 5 days   Betsy Coder, MD   06/24/2016, 8:03 AM

## 2016-06-24 NOTE — Progress Notes (Signed)
INFECTIOUS DISEASE PROGRESS NOTE  ID: Brianna [name] is a 82 y.o. female with  Principal Problem:   Hepatic abscess Active Problems:   Spinal stenosis of lumbar region   Benign essential HTN   Sepsis ([place])   Hyponatremia   Hyperkalemia   Acute liver failure without hepatic coma   Jaundice  Subjective: Feels better although she has DOE, chest pain from prev fall.  Urinary hesitancy  Abtx:  Anti-infectives    Start     Dose/Rate Route Frequency Ordered Stop   06/20/16 1400  vancomycin (VANCOCIN) 500 mg in sodium chloride 0.9 % 100 mL IVPB  Status:  Discontinued     500 mg 100 mL/hr over 60 Minutes Intravenous Every 24 hours 06/19/16 1254 06/23/16 1550   06/19/16 2200  piperacillin-tazobactam (ZOSYN) IVPB 3.375 g     3.375 g 12.5 mL/hr over 240 Minutes Intravenous Every 8 hours 06/19/16 1254     06/19/16 1245  piperacillin-tazobactam (ZOSYN) IVPB 3.375 g     3.375 g 100 mL/hr over 30 Minutes Intravenous  Once 06/19/16 1242 06/19/16 1335   06/19/16 1245  vancomycin (VANCOCIN) IVPB 1000 mg/200 mL premix     1,000 mg 200 mL/hr over 60 Minutes Intravenous  Once 06/19/16 1242 06/19/16 1456      Medications:  Scheduled: . sodium chloride   Intravenous Once  . sodium chloride   Intravenous Once  . antiseptic oral rinse  7 mL Mouth Rinse BID  . conjugated estrogens  1 Applicatorful Vaginal Once per day on Mon Wed Fri  . feeding supplement (ENSURE ENLIVE)  237 mL Oral TID BM  . feeding supplement (PRO-STAT SUGAR FREE 64)  30 mL Oral BID  . folic acid  1 mg Oral Daily  . furosemide  20 mg Intravenous Q12H  . metoprolol succinate  25 mg Oral Daily  . multivitamin with minerals  1 tablet Oral Daily  . piperacillin-tazobactam (ZOSYN)  IV  3.375 g Intravenous Q8H  . polyethylene glycol  17 g Oral BID  . saccharomyces boulardii  250 mg Oral BID  . simethicone  80 mg Oral QID  . sodium chloride flush  3 mL Intravenous Q12H    Objective: Vital signs in last 24 hours: Temp:   [97.6 F (36.4 C)-99.5 F (37.5 C)] 98.8 F (37.1 C) (07/13 0800) Pulse Rate:  [85-109] 85 (07/13 0400) Resp:  [19-34] 21 (07/13 0400) BP: (110-160)/(44-101) 134/60 mmHg (07/13 0236) SpO2:  [90 %-100 %] 98 % (07/13 0400)   General appearance: alert, cooperative, icteric and no distress Resp: clear to auscultation bilaterally Cardio: regular rate and rhythm GI: normal findings: bowel sounds normal and soft, non-tender and abnormal findings:  distended  Lab Results  Recent Labs  06/23/16 0326 06/23/16 0423 06/24/16 0342  WBC  --  34.1* 49.7*  HGB  --  6.7* 9.1*  HCT  --  18.9* 25.1*  NA 128*  --  127*  K 4.1  --  4.7  CL 100*  --  99*  CO2 23  --  22  BUN 23*  --  19  CREATININE 0.43*  --  0.41*   Liver Panel  Recent Labs  06/23/16 0326 06/24/16 0342  PROT 4.6* 5.2*  ALBUMIN 1.9* 2.1*  AST 44* 49*  ALT 109* 89*  ALKPHOS 206* 238*  BILITOT 7.8*  7.8* 5.6*  BILIDIR 3.5*  --   IBILI 4.3*  --    Sedimentation Rate No results for input(s): ESRSEDRATE in the last  72 hours. C-Reactive Protein No results for input(s): CRP in the last 72 hours.  Microbiology: Recent Results (from the past 240 hour(s))  Culture, blood (Routine X 2)     Status: None (Preliminary result)   Collection Time: 06/19/16 12:56 PM  Result Value Ref Range Status   Specimen Description BLOOD RIGHT ARM  Final   Special Requests BOTTLES DRAWN AEROBIC AND ANAEROBIC 5CC  Final   Culture   Final    NO GROWTH 4 DAYS Performed at [facility]    Report Status PENDING  Incomplete  Culture, blood (Routine X 2)     Status: None (Preliminary result)   Collection Time: 06/19/16 12:56 PM  Result Value Ref Range Status   Specimen Description BLOOD LEFT HAND  Final   Special Requests BOTTLES DRAWN AEROBIC AND ANAEROBIC 5CC  Final   Culture   Final    NO GROWTH 4 DAYS Performed at [facility]    Report Status PENDING  Incomplete  Urine culture     Status: Abnormal   Collection  Time: 06/19/16  1:31 PM  Result Value Ref Range Status   Specimen Description URINE, CLEAN CATCH  Final   Special Requests NONE  Final   Culture (A)  Final    <10,000 COLONIES/mL INSIGNIFICANT GROWTH Performed at [facility]    Report Status 06/20/2016 FINAL  Final  MRSA PCR Screening     Status: None   Collection Time: 06/19/16  5:18 PM  Result Value Ref Range Status   MRSA by PCR NEGATIVE NEGATIVE Final    Comment:        The GeneXpert MRSA Assay (FDA approved for NASAL specimens only), is one component of a comprehensive MRSA colonization surveillance program. It is not intended to diagnose MRSA infection nor to guide or monitor treatment for MRSA infections.   Aerobic/Anaerobic Culture (surgical/deep wound)     Status: None (Preliminary result)   Collection Time: 06/21/16  4:40 PM  Result Value Ref Range Status   Specimen Description LIVER ABSCESS  Final   Special Requests NONE  Final   Gram Stain   Final    ABUNDANT WBC PRESENT, PREDOMINANTLY PMN NO ORGANISMS SEEN    Culture   Final    NO GROWTH 2 DAYS Performed at [facility]    Report Status PENDING  Incomplete    Studies/Results: No results found.   Assessment/Plan: Liver Abscess For repeat CT san tomorrow WBC up- multiple causes for this-  Liver abscess, cold Ag, recent txf No change in zosyn for now.   Diverticuliltis? Pt relates LLQ pain 2 weeks pta  Urinary hesitancy  Fluid overload  Cold Agglutinin anemia apprciate heme f/u  Protein Calorie Malnutrition Nutrition f/u  Total days of antibiotics: 4 zosyn         Bobby Rumpf Infectious Diseases (pager) [phone] [email or web address] 06/24/2016, 9:39 AM  LOS: 5 days

## 2016-06-24 NOTE — Progress Notes (Signed)
Nutrition Follow-up  DOCUMENTATION CODES:   Non-severe (moderate) malnutrition in context of acute illness/injury  INTERVENTION:  - Continue Ensure Enlive po BID, each supplement provides 350 kcal and 20 grams of protein - Continue 30 mL Prostat BID, each supplement provides 100 kcal and 15 grams of protein. - Will order Magic Cup with lunch trays, each supplement provides 290 kcal and 9 grams of protein - Continue to encourage PO intakes of meals and supplements. - RD will continue to monitor for additional needs.  NUTRITION DIAGNOSIS:   Inadequate oral intake related to poor appetite, nausea as evidenced by energy intake < 75% for > 7 days. -ongoing, slowly improving.  GOAL:   Patient will meet greater than or equal to 90% of their needs -unmet  MONITOR:   PO intake, Supplement acceptance, Weight trends, Labs, Skin, I & O's  ASSESSMENT:   Brianna [name] is a 82 y.o. female with medical history significant of HTN, Thoracic vertebral fracture, who presents today with fevers, jaundice that started during last 24 hours. Patient relates that she was not feeling well on July 5, she pass out that day. She has been having ribs, chest pain since that day after she try to pull her self up. She was evaluated in the ED at that time , had negative CT and was discharge home.  7/13 No intakes documented since previous assessment. Pt reports that this AM she ate more than she has at any meal since admission; she had 3/4 bowl of corn flakes and a dish of sliced peaches. Pt reports that nausea has resolved since previous assessment. She states that appetite is slowly coming back and she is beginning to feel better although she still feels weak.   Boost Breeze has been ordered TID. Pt reports she had heartburn overnight which is very unusual for her. She states MD felt it was related to [facility] and has encouraged pt to consume milk-like nutrition supplements. Pt is interested in trying Ensure  and Magic Cup and daughter, who is at bedside, plans to buy pt a milkshake from [employer]. Noted Prostat has been ordered BID earlier this AM.   No new weight since 7/11; will monitor for weight trends if new weight is able to be obtained. Pt meets criteria for moderate malnutrition in the acute setting based on findings of mild muscle and fat wasting and poor intakes from previous assessment.   Medications reviewed; 1 mg folic acid/day, 20 mg IV Lasix every 12 hours x3 doses, daily multivitamin with minerals, PRN Zofran. Labs reviewed; Na: 127 mmol/L, Cl: 99 mmol/L, creatinine: 0.41 mg/dL, Ca: 7.4 mg/dL, LFTs elevated.   IVF: NS @ 100 mL/hr.    7/10 - Spoke with Brianna [name], family at bedside. - She endorses poor appetite, severe nausea, since 06/15/2016 - She is unsure of wt loss at this time, claims normal weight to be about 115-117#  - Daughter states patient was eating well prior to onset of this illness. However, patient was forcing herself to eat because she knew she needed to.  - Appetite has been relatively poor for an extended period time.  - Patient thought it was stress related. - Underlying abscesses vs malignancies. - Nutrition-Focused physical exam completed. Findings are mild fat depletion, mild muscle depletion, and no edema.  - Patient is still experiencing severe nausea, to undergo percutaneous drainage of abscess via CT this afternoon.   Diet Order:  Diet regular Room service appropriate?: Yes; Fluid consistency:: Thin  Skin:  Reviewed,  no issues  Last BM:  7/11  Height:   Ht Readings from Last 1 Encounters:  06/19/16 5' 3.5" (1.613 m)    Weight:   Wt Readings from Last 1 Encounters:  06/22/16 143 lb 4.8 oz (65 kg)    Ideal Body Weight:  53.4 kg  BMI:  Body mass index is 24.98 kg/(m^2).  Estimated Nutritional Needs:   Kcal:  1350-1550  Protein:  75-85 grams  Fluid:  1.3-1.5 L/day  EDUCATION NEEDS:   No education needs identified at this  time    Jarome Matin, MS, RD, LDN Inpatient Clinical Dietitian Pager # [phone] After hours/weekend pager # [phone]

## 2016-06-25 ENCOUNTER — Inpatient Hospital Stay (HOSPITAL_COMMUNITY): Payer: Medicare Other

## 2016-06-25 DIAGNOSIS — D589 Hereditary hemolytic anemia, unspecified: Secondary | ICD-10-CM | POA: Insufficient documentation

## 2016-06-25 LAB — COMPREHENSIVE METABOLIC PANEL
ALBUMIN: 2.1 g/dL — AB (ref 3.5–5.0)
ALT: 87 U/L — AB (ref 14–54)
ANION GAP: 7 (ref 5–15)
AST: 51 U/L — ABNORMAL HIGH (ref 15–41)
Alkaline Phosphatase: 258 U/L — ABNORMAL HIGH (ref 38–126)
BUN: 14 mg/dL (ref 6–20)
CHLORIDE: 96 mmol/L — AB (ref 101–111)
CO2: 25 mmol/L (ref 22–32)
CREATININE: 0.43 mg/dL — AB (ref 0.44–1.00)
Calcium: 7.7 mg/dL — ABNORMAL LOW (ref 8.9–10.3)
GFR calc non Af Amer: >60 mL/min (ref >60)
GLUCOSE: 107 mg/dL — AB (ref 65–99)
Potassium: 3.7 mmol/L (ref 3.5–5.1)
SODIUM: 128 mmol/L — AB (ref 135–145)
Total Bilirubin: 5.5 mg/dL — ABNORMAL HIGH (ref 0.3–1.2)
Total Protein: 4.9 g/dL — ABNORMAL LOW (ref 6.5–8.1)

## 2016-06-25 LAB — CBC
HCT: 21.9 % — ABNORMAL LOW (ref 36.0–46.0)
HCT: 23.1 % — ABNORMAL LOW (ref 36.0–46.0)
HEMOGLOBIN: 7.8 g/dL — AB (ref 12.0–15.0)
HEMOGLOBIN: 8.3 g/dL — AB (ref 12.0–15.0)
MCH: 31.5 pg (ref 26.0–34.0)
MCH: 32.2 pg (ref 26.0–34.0)
MCHC: 35.6 g/dL (ref 30.0–36.0)
MCHC: 35.9 g/dL (ref 30.0–36.0)
MCV: 88.3 fL (ref 78.0–100.0)
MCV: 89.5 fL (ref 78.0–100.0)
PLATELETS: 582 10*3/uL — AB (ref 150–400)
Platelets: 645 10*3/uL — ABNORMAL HIGH (ref 150–400)
RBC: 2.48 MIL/uL — ABNORMAL LOW (ref 3.87–5.11)
RBC: 2.58 MIL/uL — AB (ref 3.87–5.11)
RDW: 15.2 % (ref 11.5–15.5)
RDW: 15.2 % (ref 11.5–15.5)
WBC: 44.9 10*3/uL — ABNORMAL HIGH (ref 4.0–10.5)
WBC: 52.1 10*3/uL (ref 4.0–10.5)

## 2016-06-25 LAB — URINE MICROSCOPIC-ADD ON

## 2016-06-25 LAB — URINALYSIS, ROUTINE W REFLEX MICROSCOPIC
Bilirubin Urine: NEGATIVE
Glucose, UA: NEGATIVE mg/dL
Hgb urine dipstick: NEGATIVE
Ketones, ur: NEGATIVE mg/dL
NITRITE: NEGATIVE
PH: 5 (ref 5.0–8.0)
Protein, ur: NEGATIVE mg/dL
SPECIFIC GRAVITY, URINE: 1.012 (ref 1.005–1.030)

## 2016-06-25 LAB — SAVE SMEAR

## 2016-06-25 MED ORDER — SODIUM CHLORIDE 1 G PO TABS
1.0000 g | ORAL_TABLET | Freq: Two times a day (BID) | ORAL | Status: DC
  Administered 2016-06-26 – 2016-07-06 (×16): 1 g via ORAL
  Filled 2016-06-25 (×23): qty 1

## 2016-06-25 MED ORDER — FLUCONAZOLE IN SODIUM CHLORIDE 100-0.9 MG/50ML-% IV SOLN
100.0000 mg | INTRAVENOUS | Status: DC
  Administered 2016-06-25 – 2016-06-26 (×2): 100 mg via INTRAVENOUS
  Filled 2016-06-25 (×3): qty 50

## 2016-06-25 MED ORDER — IOPAMIDOL (ISOVUE-300) INJECTION 61%
100.0000 mL | Freq: Once | INTRAVENOUS | Status: AC | PRN
  Administered 2016-06-25: 100 mL via INTRAVENOUS

## 2016-06-25 MED ORDER — RANITIDINE HCL 150 MG/10ML PO SYRP
150.0000 mg | ORAL_SOLUTION | Freq: Two times a day (BID) | ORAL | Status: DC
  Administered 2016-06-25 – 2016-07-05 (×12): 150 mg via ORAL
  Filled 2016-06-25 (×24): qty 10

## 2016-06-25 MED ORDER — FAMOTIDINE 40 MG/5ML PO SUSR
20.0000 mg | Freq: Two times a day (BID) | ORAL | Status: DC
  Filled 2016-06-25: qty 2.5

## 2016-06-25 NOTE — Progress Notes (Signed)
Patient ID: Brianna [name], female   DOB: 04/23/1924, 82 y.o.   MRN: [id number]    Referring Physician(s): Perry,J  Supervising Physician: Corrie Mckusick  Patient Status:  Inpatient  Chief Complaint:  Hepatic abscesses  Subjective:  Pt appears weak; little appetite; jaundice persists; still with some RUQ soreness but not worse; awaiting f/u CT scheduled for today  Allergies: Other; Compazine; Erythromycin; Lyrica; Promethazine; Tramadol; and Vistaril  Medications: Prior to Admission medications   Medication Sig Start Date End Date Taking? Authorizing Provider  acetaminophen (TYLENOL) 500 MG tablet Take 1,000 mg by mouth every 6 (six) hours as needed for mild pain, moderate pain, fever or headache.   Yes Historical Provider, MD  conjugated estrogens (PREMARIN) vaginal cream Place 1 Applicatorful vaginally 3 (three) times a week.   Yes Historical Provider, MD  diphenhydrAMINE (BENADRYL) 25 MG tablet Take 25 mg by mouth at bedtime as needed for sleep.    Yes Historical Provider, MD  famotidine (PEPCID) 10 MG tablet Take 10 mg by mouth daily as needed for heartburn or indigestion.   Yes Historical Provider, MD  ibuprofen (ADVIL,MOTRIN) 200 MG tablet Take 400 mg by mouth every 6 (six) hours as needed for fever, headache, mild pain, moderate pain or cramping.   Yes Historical Provider, MD  Melatonin 5 MG TABS Take 5 mg by mouth at bedtime as needed (sleep).   Yes Historical Provider, MD  metoprolol succinate (TOPROL-XL) 25 MG 24 hr tablet Take 1 tablet (25 mg total) by mouth daily. 12/13/13  Yes Robbie Lis, MD  Multiple Vitamin (MULTIVITAMIN WITH MINERALS) TABS tablet Take 1 tablet by mouth daily.   Yes Historical Provider, MD  ondansetron (ZOFRAN) 8 MG tablet Take 8 mg by mouth every 8 (eight) hours as needed for nausea or vomiting.   Yes Historical Provider, MD  Polyethyl Glycol-Propyl Glycol (SYSTANE OP) Apply 2 drops to eye 3 (three) times daily as needed (dry eyes).   Yes Historical  Provider, MD  ibuprofen (ADVIL,MOTRIN) 800 MG tablet Take 0.5 tablets (400 mg total) by mouth 3 (three) times daily. Patient not taking: Reported on 06/19/2016 06/16/16   [name], MD     Vital Signs: BP 156/76 mmHg  Pulse 129  Temp(Src) 98.3 F (36.8 C) (Oral)  Resp 27  Ht 5' 3.5" (1.613 m)  Wt 144 lb 2.9 oz (65.4 kg)  BMI 25.14 kg/m2  SpO2 95%  Physical Exam RUQ drain intact, dressing dry, site mildly tender, about 50 cc bloody fluid in bag  Imaging: Dg Chest Port 1 View  06/24/2016  CLINICAL DATA:  Shortness of breath today. EXAM: PORTABLE CHEST 1 VIEW COMPARISON:  06/19/2016. FINDINGS: 0950 hours. Patient is rotated to the right. Low volume film with bibasilar atelectasis or infiltrate and small bilateral pleural effusions. The cardio pericardial silhouette is enlarged. Bones are diffusely demineralized. Telemetry leads overlie the chest. Pigtail catheter over the right upper quadrant is compatible with recent hepatic abscess drainage. IMPRESSION: Rotated film with vascular congestion and bibasilar atelectasis or infiltrate. Small bilateral pleural effusions. Electronically Signed   By: Misty Stanley M.D.   On: 06/24/2016 10:10   Ct Image Guided Drainage Percut Cath  Peritoneal Retroperit  06/21/2016  CLINICAL DATA:  past medical history of hypertension, T 11 fracture who was admitted to the hospital on 7/8 with malaise, fever, intermittent abdominal/substernal discomfort, diminished appetite/oral intake, weakness, jaundice and occasional nausea. Subsequent laboratory evaluation revealed marked leukocytosis, as well as anemia and elevated LFTs. She has received blood transfusion.  Current laboratory values reveal WBC of 36.8, hemoglobin 10.2, platelets 339 k, creatinine 0.49, total bilirubin 9.8 , PT 15.5, INR 1.22 . CT of the abdomen and pelvis on 7/8 revealed 2 low attenuation areas in the right hepatic lobe which are new since December 2014 and suspicious for hepatic abscesses and less  likely neoplasm. Request now received for CT guided drainage of the above-mentioned abscesses. " EXAM: CT AND ULTRASOUND GUIDED DRAINAGE OF LIVER ABSCESSES ANESTHESIA/SEDATION: Intravenous Fentanyl and Versed were administered as conscious sedation during continuous monitoring of the patient's level of consciousness and physiological / cardiorespiratory status by the radiology RN, with a total moderate sedation time of 25 minutes. PROCEDURE: The procedure, risks, benefits, and alternatives were explained to the patient. Questions regarding the procedure were encouraged and answered. The patient understands and consents to the procedure. The lesions were better localized with ultrasound so survey ultrasound of the level was used to determine appropriate skin entry site. The operative field was prepped with chlorhexidinein a sterile fashion, and a sterile drape was applied covering the operative field. A sterile gown and sterile gloves were used for the procedure. Local anesthesia was provided with 1% Lidocaine. Under real-time ultrasound guidance, a 25 gauge trocar needle was advanced through the segment 5 lesion into the segment 7 lesion. Purulent material could be aspirated. An Amplatz guidewire advanced easily. Limited CT was performed to confirm appropriate needle a guidewire positioning. The tract was then dilated to allow placement of a 12 French multi side-hole biliary drain catheter, positioned with the distal sideholes and the segment 7 collection, proximal side holes in the segment 5 collection. A total of 75 mL purulent material were aspirated. A sample was sent for Gram stain, culture and sensitivity. Catheter was secured externally with 0 Prolene suture and StatLock, and placed to gravity drain after flushing. The patient tolerated the procedure well. COMPLICATIONS: None immediate FINDINGS: Two complex right lobe fluid collections were identified on ultrasound corresponding to the lesions seen on prior  CT. Multi side hole drain catheter was placed across both lesions as detailed above. IMPRESSION: 1. Technically successful CT-guided drain catheter placement across 2 right lobe hepatic abscesses. Electronically Signed   By: Lucrezia Europe M.D.   On: 06/21/2016 17:30    Labs:  CBC:  Recent Labs  06/22/16 0316 06/23/16 0423 06/24/16 0342 06/25/16 0510  WBC 30.7* 34.1* 49.7* 44.9*  HGB 8.2* 6.7* 9.1* 7.8*  HCT 22.9* 18.9* 25.1* 21.9*  PLT 413* 479* 506* 582*    COAGS:  Recent Labs  06/20/16 0231 06/20/16 1330  INR 1.31 1.22    BMP:  Recent Labs  06/22/16 0316 06/23/16 0326 06/24/16 0342 06/25/16 0510  NA 126* 128* 127* 128*  K 3.3* 4.1 4.7 3.7  CL 96* 100* 99* 96*  CO2 22 23 22 25   GLUCOSE 125* 123* 119* 107*  BUN 20 23* 19 14  CALCIUM 7.5* 7.4* 7.4* 7.7*  CREATININE 0.41* 0.43* 0.41* 0.43*  GFRNONAA >60 >60 >60 >60  GFRAA >60 >60 >60 >60    LIVER FUNCTION TESTS:  Recent Labs  06/22/16 0316 06/23/16 0326 06/24/16 0342 06/25/16 0510  BILITOT 13.3* 7.8*  7.8* 5.6* 5.5*  AST 72* 44* 49* 51*  ALT 142* 109* 89* 87*  ALKPHOS 235* 206* 238* 258*  PROT 5.5* 4.6* 5.2* 4.9*  ALBUMIN 2.3* 1.9* 2.1* 2.1*    Assessment and Plan: S/p hepatic abscess drainage 7/10; AF; WBC 44.9(49.7), HGB 7.8(9.1), t bili 5.5(5.6), creat ok ; fluid cx's neg  to date; for f/u CT today; other plans as per IM/ID/GI/ONC   Electronically Signed: D. Rowe Robert 06/25/2016, 4:24 PM   I spent a total of 15 minutes at the the patient's bedside AND on the patient's hospital floor or unit, greater than 50% of which was counseling/coordinating care for hepatic abscess drainage

## 2016-06-25 NOTE — Progress Notes (Signed)
INFECTIOUS DISEASE PROGRESS NOTE  ID: Brianna [name] is a 82 y.o. female with  Principal Problem:   Hepatic abscess Active Problems:   Spinal stenosis of lumbar region   Benign essential HTN   Sepsis ([place])   Hyponatremia   Hyperkalemia   Jaundice   Elevated LFTs   Malnutrition of moderate degree   Hypoxia   Liver abscess  Subjective: C/o intermittent abd pain  Abtx:  Anti-infectives    Start     Dose/Rate Route Frequency Ordered Stop   06/25/16 0930  fluconazole (DIFLUCAN) IVPB 100 mg     100 mg 50 mL/hr over 60 Minutes Intravenous Every 24 hours 06/25/16 0841     06/20/16 1400  vancomycin (VANCOCIN) 500 mg in sodium chloride 0.9 % 100 mL IVPB  Status:  Discontinued     500 mg 100 mL/hr over 60 Minutes Intravenous Every 24 hours 06/19/16 1254 06/23/16 1550   06/19/16 2200  piperacillin-tazobactam (ZOSYN) IVPB 3.375 g     3.375 g 12.5 mL/hr over 240 Minutes Intravenous Every 8 hours 06/19/16 1254     06/19/16 1245  piperacillin-tazobactam (ZOSYN) IVPB 3.375 g     3.375 g 100 mL/hr over 30 Minutes Intravenous  Once 06/19/16 1242 06/19/16 1335   06/19/16 1245  vancomycin (VANCOCIN) IVPB 1000 mg/200 mL premix     1,000 mg 200 mL/hr over 60 Minutes Intravenous  Once 06/19/16 1242 06/19/16 1456      Medications:  Scheduled: . sodium chloride   Intravenous Once  . sodium chloride   Intravenous Once  . antiseptic oral rinse  7 mL Mouth Rinse BID  . conjugated estrogens  1 Applicatorful Vaginal Once per day on Mon Wed Fri  . feeding supplement (ENSURE ENLIVE)  237 mL Oral TID BM  . feeding supplement (PRO-STAT SUGAR FREE 64)  30 mL Oral BID  . fluconazole (DIFLUCAN) IV  100 mg Intravenous Q24H  . folic acid  1 mg Oral Daily  . furosemide  20 mg Intravenous Q12H  . metoprolol succinate  25 mg Oral Daily  . multivitamin with minerals  1 tablet Oral Daily  . piperacillin-tazobactam (ZOSYN)  IV  3.375 g Intravenous Q8H  . polyethylene glycol  17 g Oral BID  .  ranitidine  150 mg Oral BID  . saccharomyces boulardii  250 mg Oral BID  . simethicone  80 mg Oral QID  . sodium chloride flush  3 mL Intravenous Q12H    Objective: Vital signs in last 24 hours: Temp:  [97.9 F (36.6 C)-98.9 F (37.2 C)] 97.9 F (36.6 C) (07/14 0535) Pulse Rate:  [80-107] 86 (07/14 0800) Resp:  [16-31] 21 (07/14 0800) BP: (79-149)/(44-70) 119/44 mmHg (07/14 0500) SpO2:  [91 %-100 %] 99 % (07/14 0800) Weight:  [65.4 kg (144 lb 2.9 oz)] 65.4 kg (144 lb 2.9 oz) (07/14 0500)   General appearance: alert, cooperative, icteric and no distress Resp: clear to auscultation bilaterally Cardio: regular rate and rhythm GI: normal findings: bowel sounds normal and soft, non-tender and abnormal findings:  distended  Lab Results  Recent Labs  06/24/16 0342 06/25/16 0510  WBC 49.7* 44.9*  HGB 9.1* 7.8*  HCT 25.1* 21.9*  NA 127* 128*  K 4.7 3.7  CL 99* 96*  CO2 22 25  BUN 19 14  CREATININE 0.41* 0.43*   Liver Panel  Recent Labs  06/23/16 0326 06/24/16 0342 06/25/16 0510  PROT 4.6* 5.2* 4.9*  ALBUMIN 1.9* 2.1* 2.1*  AST 44* 49* 51*  ALT 109* 89* 87*  ALKPHOS 206* 238* 258*  BILITOT 7.8*  7.8* 5.6* 5.5*  BILIDIR 3.5*  --   --   IBILI 4.3*  --   --    Sedimentation Rate No results for input(s): ESRSEDRATE in the last 72 hours. C-Reactive Protein No results for input(s): CRP in the last 72 hours.  Microbiology: Recent Results (from the past 240 hour(s))  Culture, blood (Routine X 2)     Status: None   Collection Time: 06/19/16 12:56 PM  Result Value Ref Range Status   Specimen Description BLOOD RIGHT ARM  Final   Special Requests BOTTLES DRAWN AEROBIC AND ANAEROBIC 5CC  Final   Culture   Final    NO GROWTH 5 DAYS Performed at [facility]    Report Status 06/24/2016 FINAL  Final  Culture, blood (Routine X 2)     Status: None   Collection Time: 06/19/16 12:56 PM  Result Value Ref Range Status   Specimen Description BLOOD LEFT HAND  Final     Special Requests BOTTLES DRAWN AEROBIC AND ANAEROBIC 5CC  Final   Culture   Final    NO GROWTH 5 DAYS Performed at [facility]    Report Status 06/24/2016 FINAL  Final  Urine culture     Status: Abnormal   Collection Time: 06/19/16  1:31 PM  Result Value Ref Range Status   Specimen Description URINE, CLEAN CATCH  Final   Special Requests NONE  Final   Culture (A)  Final    <10,000 COLONIES/mL INSIGNIFICANT GROWTH Performed at [facility]    Report Status 06/20/2016 FINAL  Final  MRSA PCR Screening     Status: None   Collection Time: 06/19/16  5:18 PM  Result Value Ref Range Status   MRSA by PCR NEGATIVE NEGATIVE Final    Comment:        The GeneXpert MRSA Assay (FDA approved for NASAL specimens only), is one component of a comprehensive MRSA colonization surveillance program. It is not intended to diagnose MRSA infection nor to guide or monitor treatment for MRSA infections.   Aerobic/Anaerobic Culture (surgical/deep wound)     Status: None (Preliminary result)   Collection Time: 06/21/16  4:40 PM  Result Value Ref Range Status   Specimen Description LIVER ABSCESS  Final   Special Requests NONE  Final   Gram Stain   Final    ABUNDANT WBC PRESENT, PREDOMINANTLY PMN NO ORGANISMS SEEN    Culture   Final    NO GROWTH 3 DAYS Performed at [facility]    Report Status PENDING  Incomplete    Studies/Results: Dg Chest Port 1 View  06/24/2016  CLINICAL DATA:  Shortness of breath today. EXAM: PORTABLE CHEST 1 VIEW COMPARISON:  06/19/2016. FINDINGS: 0950 hours. Patient is rotated to the right. Low volume film with bibasilar atelectasis or infiltrate and small bilateral pleural effusions. The cardio pericardial silhouette is enlarged. Bones are diffusely demineralized. Telemetry leads overlie the chest. Pigtail catheter over the right upper quadrant is compatible with recent hepatic abscess drainage. IMPRESSION: Rotated film with vascular congestion and  bibasilar atelectasis or infiltrate. Small bilateral pleural effusions. Electronically Signed   By: Misty Stanley M.D.   On: 06/24/2016 10:10     Assessment/Plan: Liver Abscess Await her repeat CT scan Her WBC remains elevated.  No drain output noted in i/o No change in zosyn for now.   Diverticuliltis? Pt relates LLQ pain 2 weeks pta  Urinary  hesitancy  Fluid overload  Cold Agglutinin anemia apprciate heme f/u  Protein Calorie Malnutrition Nutrition f/u  Total days of antibiotics: 5 zosyn         Bobby Rumpf Infectious Diseases (pager) [phone] [email or web address] 06/25/2016, 9:56 AM  LOS: 6 days

## 2016-06-25 NOTE — Progress Notes (Signed)
Spoke with pt and daughter concerning [place] needs. Pt may benefit with HHRN if discharging home for safety if going home. May also benefit with PT eval.  CM will continue to follow for DC needs.

## 2016-06-25 NOTE — Progress Notes (Signed)
Pharmacy Antibiotic Note  Brianna [name] is a 82 y.o. female admitted on 06/19/2016 with c/o fever and weakness.  Vancomycin and zosyn were started on admission for broad coverage for suspected sepsis. She was also found to have elevated LFTs and now s/p perc drainage of liver abscess on 7/10.  Today, 06/25/2016: - s/p perc drainage of liver abscess on 7/10 - afeb, wbc persistently elevated at 44.9, scr stable 0.43 (crcl~43, pt's 82 y.o) - ID recommends to cont with current abx regimen for now - Repeat CT scan today  Plan: - continue zosyn 3.375 gm IV q8h (infuse over 4 hours) - f/u renal function, cultures  _________________________________  Height: 5' 3.5" (161.3 cm) Weight: 144 lb 2.9 oz (65.4 kg) IBW/kg (Calculated) : 53.55  Temp (24hrs), Avg:98.4 F (36.9 C), Min:97.9 F (36.6 C), Max:98.9 F (37.2 C)   Recent Labs Lab 06/19/16 1308 06/19/16 1656  06/21/16 0324 06/21/16 0508 06/22/16 0316 06/23/16 0326 06/23/16 0423 06/24/16 0342 06/25/16 0510  WBC  --   --   < >  --  36.8* 30.7*  --  34.1* 49.7* 44.9*  CREATININE  --   --   < > 0.49  --  0.41* 0.43*  --  0.41* 0.43*  LATICACIDVEN 1.56 0.68  --   --   --   --  0.7  --   --   --   < > = values in this interval not displayed.  Estimated Creatinine Clearance: 43 mL/min (by C-G formula based on Cr of 0.43).    Allergies  Allergen Reactions  . Other Nausea And Vomiting    *All pain medications*  . Compazine [Prochlorperazine] Other (See Comments)    Restless, hallucinations  . Erythromycin     "passed out"  . Lyrica [Pregabalin] Other (See Comments)    Sleep walking  . Promethazine Other (See Comments)    Restless, hallucinations  . Tramadol Nausea And Vomiting  . Vistaril [Hydroxyzine Hcl] Other (See Comments)    Stated she had hallucinations.    Antimicrobials this admission: 7/8 vancomycin >>  7/12 7/8 zosyn >>  7/14 fluconazole (MD) >>  Dose adjustments this admission: N/a  Microbiology  results: 7/8 BCx x2: NGF 7/8 UCx: insignif growth FINAL 7/8 MRSA PCR neg 7/10 liver abscesss: ngtd  Thank you for allowing pharmacy to be a part of this patient's care.  Peggyann Juba, PharmD, BCPS Pager: [phone] 06/25/2016 1:29 PM

## 2016-06-25 NOTE — Progress Notes (Signed)
[facility] Progress Note   Brianna [name]  [id number]  DOB: Jun 19, 1924  DOA: 06/19/2016 PCP: Tawanna Solo, MD  Brief narrative: Brianna [name] is a 82 y.o. female with HTN, cold agglutinin disease presenting with temp of 103, jaundice and dark urine and weight loss of about 10 lbs. She had been feeling bad since 7/5 and was seen in the ER for syncope and discharged home. She returned to there ER after she was noted to be jaundiced. She was found to have elevated LFTS, WBC count of 40 and a CT scan showing right lobe hepatic abscesses. She also had a sodium of 125 and potassium of 5.7. Tylenol level was normal.    Subjective: Vaginal itching today. Appetite still poor. No dyspnea and no cough. Abdomen still sore and distended. Urinating well- no burning. I have had and extensive conversation with her and her daughter about the plan for today.   Assessment/Plan: Principal Problem:   Hepatic abscess/Sepsis/ Elevated LFTs - Vanc and Zosyn - drain placed on 7/10 per IR - f/u cultures - ID consulted- antibiotics per ID - per IR, she needs a repeat CT today - I have ordered it   Active Problems:  Anemia- h/o cold agglutinin disease - Hb dropped from 9 to 5.6 on 7/9- transfused 3 U PRBC - Hb again dropped to 6.7 on 7/12-   no signs of bleeding- transfused 2 U PRBC - cold agglutinin titer > 1: 4096 - last LDH and haptoglobin earlier in on the admission were not consistent with hemolysis  - oncology assisting with management- questioning underlying proliferative disorder  Hyponatremia - U sodium < 10   - due to dehydration, hypoalbuminemia and third spacing - unfortunately, she is third spacing most of the IV fluid given - stopped IVF and started Lasix  X 3 doses- > 3 L negative balance yesterday- no change in sodium yet - cont follow I and O- hold further lasix today to prevent recurrent dehydration- start sodium tabs today  Hypoalbuminemia/ severe protein calorie  malnutrition/ anasarca  - due to abscesses and poor appetite - Boost is causing heartburn - changed to Ensure - add Prostat  Hypoxia on exertion - - pulm edema due to third spacing and fluid overload - atelectasis due to being bed bound - started Lasix - IS  Abdominal pain - pain more gas like as it is sharp and in right upper abdomen- abdomen is distended and tympatic - Gas x      Benign essential HTN   - Metoprolol    Hyperkalemia - cause? - resolved    Antibiotics: Anti-infectives    Start     Dose/Rate Route Frequency Ordered Stop   06/25/16 0930  fluconazole (DIFLUCAN) IVPB 100 mg     100 mg 50 mL/hr over 60 Minutes Intravenous Every 24 hours 06/25/16 0841     06/20/16 1400  vancomycin (VANCOCIN) 500 mg in sodium chloride 0.9 % 100 mL IVPB  Status:  Discontinued     500 mg 100 mL/hr over 60 Minutes Intravenous Every 24 hours 06/19/16 1254 06/23/16 1550   06/19/16 2200  piperacillin-tazobactam (ZOSYN) IVPB 3.375 g     3.375 g 12.5 mL/hr over 240 Minutes Intravenous Every 8 hours 06/19/16 1254     06/19/16 1245  piperacillin-tazobactam (ZOSYN) IVPB 3.375 g     3.375 g 100 mL/hr over 30 Minutes Intravenous  Once 06/19/16 1242 06/19/16 1335   06/19/16 1245  vancomycin (VANCOCIN) IVPB 1000 mg/200 mL premix  1,000 mg 200 mL/hr over 60 Minutes Intravenous  Once 06/19/16 1242 06/19/16 1456     Code Status:     Code Status Orders        Start     Ordered   06/19/16 1721  Do not attempt resuscitation (DNR)   Continuous    Question Answer Comment  In the event of cardiac or respiratory ARREST Do not call a "code blue"   In the event of cardiac or respiratory ARREST Do not perform Intubation, CPR, defibrillation or ACLS   In the event of cardiac or respiratory ARREST Use medication by any route, position, wound care, and other measures to relive pain and suffering. May use oxygen, suction and manual treatment of airway obstruction as needed for comfort.       06/19/16 1720    Code Status History    Date Active Date Inactive Code Status Order ID Comments User Context   12/10/2013  5:39 AM 12/13/2013  2:12 PM Full Code [id number]  Etta Quill, DO ED     Family Communication: daughters Disposition Plan: to be determined- cont to follow in SDU DVT prophylaxis: SCDS Consultants: hematology, GI, ID Procedures: RUQ drain    Objective: Filed Weights   06/22/16 0600 06/24/16 0845 06/25/16 0500  Weight: 65 kg (143 lb 4.8 oz) 67.9 kg (149 lb 11.1 oz) 65.4 kg (144 lb 2.9 oz)    Intake/Output Summary (Last 24 hours) at 06/25/16 1449 Last data filed at 06/25/16 1400  Gross per 24 hour  Intake    545 ml  Output   3075 ml  Net  -2530 ml     Vitals Filed Vitals:   06/25/16 0700 06/25/16 0800 06/25/16 1200 06/25/16 1400  BP:   116/56 156/76  Pulse: 83 86 97 129  Temp:  98.3 F (36.8 C) 98.3 F (36.8 C)   TempSrc:  Oral Oral   Resp: 19 21 22 27   Height:      Weight:      SpO2: 98% 99% 95% 95%    Exam:  General:  Pt is alert, not in acute distress  HEENT: +++ icterus, No thrush, oral mucosa moist  Cardiovascular: regular rate and rhythm, S1/S2 No murmur  Respiratory: clear to auscultation bilaterally   Abdomen: Soft, +Bowel sounds,   Tender in LUQ and RUQ, tympanic/  distended, no guarding  MSK: No cyanosis or clubbing- mild pedal edema   Data Reviewed: Basic Metabolic Panel:  Recent Labs Lab 06/21/16 0324 06/22/16 0316 06/23/16 0326 06/24/16 0342 06/25/16 0510  NA 127* 126* 128* 127* 128*  K 4.1 3.3* 4.1 4.7 3.7  CL 96* 96* 100* 99* 96*  CO2 23 22 23 22 25   GLUCOSE 109* 125* 123* 119* 107*  BUN 21* 20 23* 19 14  CREATININE 0.49 0.41* 0.43* 0.41* 0.43*  CALCIUM 8.0* 7.5* 7.4* 7.4* 7.7*   Liver Function Tests:  Recent Labs Lab 06/21/16 0324 06/22/16 0316 06/23/16 0326 06/24/16 0342 06/25/16 0510  AST 99* 72* 44* 49* 51*  ALT 175* 142* 109* 89* 87*  ALKPHOS 243* 235* 206* 238* 258*  BILITOT 9.8* 13.3*  7.8*  7.8* 5.6* 5.5*  PROT 5.6* 5.5* 4.6* 5.2* 4.9*  ALBUMIN 2.6* 2.3* 1.9* 2.1* 2.1*   No results for input(s): LIPASE, AMYLASE in the last 168 hours. No results for input(s): AMMONIA in the last 168 hours. CBC:  Recent Labs Lab 06/19/16 1256  06/21/16 0508 06/22/16 0316 06/23/16 0423 06/24/16 0342 06/25/16 0510  WBC  40.0*  < > 36.8* 30.7* 34.1* 49.7* 44.9*  NEUTROABS 37.4*  --   --   --   --   --   --   HGB 7.2*  < > 10.2* 8.2* 6.7* 9.1* 7.8*  HCT 20.3*  < > 29.1* 22.9* 18.9* 25.1* 21.9*  MCV 92.7  < > 89.8 88.1 87.5 89.3 88.3  PLT 456*  < > 339 413* 479* 506* 582*  < > = values in this interval not displayed. Cardiac Enzymes: No results for input(s): CKTOTAL, CKMB, CKMBINDEX, TROPONINI in the last 168 hours. BNP (last 3 results) No results for input(s): BNP in the last 8760 hours.  ProBNP (last 3 results) No results for input(s): PROBNP in the last 8760 hours.  CBG: No results for input(s): GLUCAP in the last 168 hours.  Recent Results (from the past 240 hour(s))  Culture, blood (Routine X 2)     Status: None   Collection Time: 06/19/16 12:56 PM  Result Value Ref Range Status   Specimen Description BLOOD RIGHT ARM  Final   Special Requests BOTTLES DRAWN AEROBIC AND ANAEROBIC 5CC  Final   Culture   Final    NO GROWTH 5 DAYS Performed at [facility]    Report Status 06/24/2016 FINAL  Final  Culture, blood (Routine X 2)     Status: None   Collection Time: 06/19/16 12:56 PM  Result Value Ref Range Status   Specimen Description BLOOD LEFT HAND  Final   Special Requests BOTTLES DRAWN AEROBIC AND ANAEROBIC 5CC  Final   Culture   Final    NO GROWTH 5 DAYS Performed at [facility]    Report Status 06/24/2016 FINAL  Final  Urine culture     Status: Abnormal   Collection Time: 06/19/16  1:31 PM  Result Value Ref Range Status   Specimen Description URINE, CLEAN CATCH  Final   Special Requests NONE  Final   Culture (A)  Final    <10,000 COLONIES/mL  INSIGNIFICANT GROWTH Performed at [facility]    Report Status 06/20/2016 FINAL  Final  MRSA PCR Screening     Status: None   Collection Time: 06/19/16  5:18 PM  Result Value Ref Range Status   MRSA by PCR NEGATIVE NEGATIVE Final    Comment:        The GeneXpert MRSA Assay (FDA approved for NASAL specimens only), is one component of a comprehensive MRSA colonization surveillance program. It is not intended to diagnose MRSA infection nor to guide or monitor treatment for MRSA infections.   Aerobic/Anaerobic Culture (surgical/deep wound)     Status: None (Preliminary result)   Collection Time: 06/21/16  4:40 PM  Result Value Ref Range Status   Specimen Description LIVER ABSCESS  Final   Special Requests NONE  Final   Gram Stain   Final    ABUNDANT WBC PRESENT, PREDOMINANTLY PMN NO ORGANISMS SEEN    Culture   Final    NO GROWTH 4 DAYS NO ANAEROBES ISOLATED; CULTURE IN PROGRESS FOR 5 DAYS Performed at [facility]    Report Status PENDING  Incomplete     Studies: Dg Chest Port 1 View  06/24/2016  CLINICAL DATA:  Shortness of breath today. EXAM: PORTABLE CHEST 1 VIEW COMPARISON:  06/19/2016. FINDINGS: 0950 hours. Patient is rotated to the right. Low volume film with bibasilar atelectasis or infiltrate and small bilateral pleural effusions. The cardio pericardial silhouette is enlarged. Bones are diffusely demineralized. Telemetry leads  overlie the chest. Pigtail catheter over the right upper quadrant is compatible with recent hepatic abscess drainage. IMPRESSION: Rotated film with vascular congestion and bibasilar atelectasis or infiltrate. Small bilateral pleural effusions. Electronically Signed   By: Misty Stanley M.D.   On: 06/24/2016 10:10    Scheduled Meds:  Scheduled Meds: . sodium chloride   Intravenous Once  . sodium chloride   Intravenous Once  . antiseptic oral rinse  7 mL Mouth Rinse BID  . conjugated estrogens  1 Applicatorful Vaginal Once per day  on Mon Wed Fri  . feeding supplement (ENSURE ENLIVE)  237 mL Oral TID BM  . feeding supplement (PRO-STAT SUGAR FREE 64)  30 mL Oral BID  . fluconazole (DIFLUCAN) IV  100 mg Intravenous Q24H  . folic acid  1 mg Oral Daily  . metoprolol succinate  25 mg Oral Daily  . multivitamin with minerals  1 tablet Oral Daily  . piperacillin-tazobactam (ZOSYN)  IV  3.375 g Intravenous Q8H  . polyethylene glycol  17 g Oral BID  . ranitidine  150 mg Oral BID  . saccharomyces boulardii  250 mg Oral BID  . simethicone  80 mg Oral QID  . sodium chloride flush  3 mL Intravenous Q12H   Continuous Infusions:    Time spent on care of this patient: 35 min   Norton, MD 06/25/2016, 2:49 PM  LOS: 6 days   [facility] Office  [phone] Pager - Text Page per [email or web address] If 7PM-7AM, please contact night-coverage [email or web address]

## 2016-06-26 DIAGNOSIS — D591 Other autoimmune hemolytic anemias: Secondary | ICD-10-CM

## 2016-06-26 DIAGNOSIS — D5912 Cold autoimmune hemolytic anemia: Secondary | ICD-10-CM | POA: Insufficient documentation

## 2016-06-26 DIAGNOSIS — D472 Monoclonal gammopathy: Secondary | ICD-10-CM | POA: Insufficient documentation

## 2016-06-26 LAB — COMPREHENSIVE METABOLIC PANEL
ALT: 76 U/L — AB (ref 14–54)
AST: 38 U/L (ref 15–41)
Albumin: 2.1 g/dL — ABNORMAL LOW (ref 3.5–5.0)
Alkaline Phosphatase: 231 U/L — ABNORMAL HIGH (ref 38–126)
Anion gap: 8 (ref 5–15)
BUN: 14 mg/dL (ref 6–20)
CALCIUM: 7.7 mg/dL — AB (ref 8.9–10.3)
CHLORIDE: 94 mmol/L — AB (ref 101–111)
CO2: 27 mmol/L (ref 22–32)
CREATININE: 0.42 mg/dL — AB (ref 0.44–1.00)
Glucose, Bld: 108 mg/dL — ABNORMAL HIGH (ref 65–99)
POTASSIUM: 3.2 mmol/L — AB (ref 3.5–5.1)
SODIUM: 129 mmol/L — AB (ref 135–145)
TOTAL PROTEIN: 5.2 g/dL — AB (ref 6.5–8.1)
Total Bilirubin: 5.1 mg/dL — ABNORMAL HIGH (ref 0.3–1.2)

## 2016-06-26 LAB — CBC
HCT: 22.1 % — ABNORMAL LOW (ref 36.0–46.0)
HEMATOCRIT: 21.6 % — AB (ref 36.0–46.0)
HEMOGLOBIN: 7.6 g/dL — AB (ref 12.0–15.0)
Hemoglobin: 7.8 g/dL — ABNORMAL LOW (ref 12.0–15.0)
MCH: 31.2 pg (ref 26.0–34.0)
MCH: 31.9 pg (ref 26.0–34.0)
MCHC: 35.3 g/dL (ref 30.0–36.0)
MCHC: 35.2 g/dL (ref 30.0–36.0)
MCV: 88.4 fL (ref 78.0–100.0)
MCV: 90.8 fL (ref 78.0–100.0)
PLATELETS: 648 10*3/uL — AB (ref 150–400)
Platelets: 711 10*3/uL — ABNORMAL HIGH (ref 150–400)
RBC: 2.5 MIL/uL — AB (ref 3.87–5.11)
RBC: 2.38 MIL/uL — ABNORMAL LOW (ref 3.87–5.11)
RDW: 15.1 % (ref 11.5–15.5)
RDW: 15.5 % (ref 11.5–15.5)
WBC: 48.3 10*3/uL — AB (ref 4.0–10.5)
WBC: 47.4 10*3/uL — AB (ref 4.0–10.5)

## 2016-06-26 MED ORDER — FUROSEMIDE 10 MG/ML IJ SOLN
20.0000 mg | Freq: Once | INTRAMUSCULAR | Status: AC
  Administered 2016-06-26: 20 mg via INTRAVENOUS
  Filled 2016-06-26: qty 2

## 2016-06-26 MED ORDER — POTASSIUM CHLORIDE CRYS ER 20 MEQ PO TBCR
40.0000 meq | EXTENDED_RELEASE_TABLET | ORAL | Status: AC
  Administered 2016-06-26 (×2): 40 meq via ORAL
  Filled 2016-06-26 (×2): qty 2

## 2016-06-26 NOTE — Progress Notes (Signed)
Referring Physician(s): Dr [name]  Supervising Physician: Sandi Mariscal  Patient Status:  Inpatient  Chief Complaint:  Hepatic abscess  Subjective:  Drain placed 7/10 in IR Leukocytosis remains Denies pain Restful CT 7/14 shows no real change in abscesses   Allergies: Other; Compazine; Erythromycin; Lyrica; Promethazine; Tramadol; and Vistaril  Medications: Prior to Admission medications   Medication Sig Start Date End Date Taking? Authorizing Provider  acetaminophen (TYLENOL) 500 MG tablet Take 1,000 mg by mouth every 6 (six) hours as needed for mild pain, moderate pain, fever or headache.   Yes Historical Provider, MD  conjugated estrogens (PREMARIN) vaginal cream Place 1 Applicatorful vaginally 3 (three) times a week.   Yes Historical Provider, MD  diphenhydrAMINE (BENADRYL) 25 MG tablet Take 25 mg by mouth at bedtime as needed for sleep.    Yes Historical Provider, MD  famotidine (PEPCID) 10 MG tablet Take 10 mg by mouth daily as needed for heartburn or indigestion.   Yes Historical Provider, MD  ibuprofen (ADVIL,MOTRIN) 200 MG tablet Take 400 mg by mouth every 6 (six) hours as needed for fever, headache, mild pain, moderate pain or cramping.   Yes Historical Provider, MD  Melatonin 5 MG TABS Take 5 mg by mouth at bedtime as needed (sleep).   Yes Historical Provider, MD  metoprolol succinate (TOPROL-XL) 25 MG 24 hr tablet Take 1 tablet (25 mg total) by mouth daily. 12/13/13  Yes Robbie Lis, MD  Multiple Vitamin (MULTIVITAMIN WITH MINERALS) TABS tablet Take 1 tablet by mouth daily.   Yes Historical Provider, MD  ondansetron (ZOFRAN) 8 MG tablet Take 8 mg by mouth every 8 (eight) hours as needed for nausea or vomiting.   Yes Historical Provider, MD  Polyethyl Glycol-Propyl Glycol (SYSTANE OP) Apply 2 drops to eye 3 (three) times daily as needed (dry eyes).   Yes Historical Provider, MD  ibuprofen (ADVIL,MOTRIN) 800 MG tablet Take 0.5 tablets (400 mg total) by mouth 3  (three) times daily. Patient not taking: Reported on 06/19/2016 06/16/16   [name], MD     Vital Signs: BP 109/41 mmHg  Pulse 89  Temp(Src) 98.2 F (36.8 C) (Oral)  Resp 17  Ht 5' 3.5" (1.613 m)  Wt 144 lb 2.9 oz (65.4 kg)  BMI 25.14 kg/m2  SpO2 96%  Physical Exam  Skin: Skin is warm and dry.  Site of hepatic abscess drain  Clean and dry NT no bleeding No sign of infection  CT 7/14: no sig change in abscesses  afeb Wbc 48.3 Cx no growth  Nursing note and vitals reviewed.   Imaging: Ct Abdomen Pelvis W Contrast  06/25/2016  CLINICAL DATA:  82 year old female with hypertension. Jaundice. Loss of weight. Fevers. EXAM: CT ABDOMEN AND PELVIS WITH CONTRAST TECHNIQUE: Multidetector CT imaging of the abdomen and pelvis was performed using the standard protocol following bolus administration of intravenous contrast. CONTRAST:  160mL ISOVUE-300 IOPAMIDOL (ISOVUE-300) INJECTION 61% COMPARISON:  06/19/2016 FINDINGS: Lower chest: There are bilateral pleural effusions, right greater than left. These appear increased in volume when compared with previous exam. Hepatobiliary: Liver abscess ease are again identified. A percutaneous drainage catheter is in place. The larger of the 2 abscesses measures 4.8 cm, image number 48 of series 2. Previously 4.7 cm. The more lateral abscess measures 3.7 cm, image 34 of series 2. Previously 4.2 cm. No new fluid collections identified. Pancreas: There are a few scattered low-density lesions within the pancreas which are not changed from previous exam. The largest  is in the distal tail measuring 7 mm, image 43 of series 2. Spleen: The spleen appears normal. Adrenals/Urinary Tract: Indeterminate, intermediate attenuating lesion in the mid left kidney measures 1.7 cm and 39 HU, image 45 of series 2. Unchanged from previous study. The bladder is normal. Stomach/Bowel: The stomach is normal. There is no pathologic dilatation of the small bowel loops. Moderate stool  burden is identified throughout the colon. No pathologic dilatation of the large bowel loops identified. Vascular/Lymphatic: Calcified atherosclerotic disease involves the abdominal aorta. No aneurysm. No enlarged retroperitoneal or mesenteric adenopathy. No enlarged pelvic or inguinal lymph nodes. Reproductive: The uterus and adnexal structures are unremarkable. Other: There is a small amount of ascites noted within the dependent portion of the pelvis. The diffuse body wall edema is identified. A small amount of perihepatic ascites noted. Musculoskeletal: No aggressive lytic or sclerotic bone lesions. The bones appear osteopenic. There is a stable compression fractures involve T11 and L1. Degenerative disc disease is noted at L4-5. IMPRESSION: 1. Two liver abscesses are again identified within the right lobe. Overall there has been no significant change in size of these complex fluid collections. The percutaneous drainage catheter remains in appropriate position. 2. Similar appearance of indeterminate low-attenuation lesions within the spleen an intermediate attenuating foci within the left kidney. Although favored to represent benign foci these will require nonemergent follow-up imaging with contrast enhanced MRI of the upper abdomen per for bili performed following resolution of the patient's current clinical condition on an outpatient basis. 3. Aortic atherosclerosis 4. Increase in pleural effusions and body wall edema. Electronically Signed   By: Kerby Moors M.D.   On: 06/25/2016 18:25   Dg Chest Port 1 View  06/24/2016  CLINICAL DATA:  Shortness of breath today. EXAM: PORTABLE CHEST 1 VIEW COMPARISON:  06/19/2016. FINDINGS: 0950 hours. Patient is rotated to the right. Low volume film with bibasilar atelectasis or infiltrate and small bilateral pleural effusions. The cardio pericardial silhouette is enlarged. Bones are diffusely demineralized. Telemetry leads overlie the chest. Pigtail catheter over the  right upper quadrant is compatible with recent hepatic abscess drainage. IMPRESSION: Rotated film with vascular congestion and bibasilar atelectasis or infiltrate. Small bilateral pleural effusions. Electronically Signed   By: Misty Stanley M.D.   On: 06/24/2016 10:10    Labs:  CBC:  Recent Labs  06/24/16 0342 06/25/16 0510 06/25/16 1700 06/26/16 0335  WBC 49.7* 44.9* 52.1* 48.3*  HGB 9.1* 7.8* 8.3* 7.8*  HCT 25.1* 21.9* 23.1* 22.1*  PLT 506* 582* 645* 648*    COAGS:  Recent Labs  06/20/16 0231 06/20/16 1330  INR 1.31 1.22    BMP:  Recent Labs  06/23/16 0326 06/24/16 0342 06/25/16 0510 06/26/16 0335  NA 128* 127* 128* 129*  K 4.1 4.7 3.7 3.2*  CL 100* 99* 96* 94*  CO2 23 22 25 27   GLUCOSE 123* 119* 107* 108*  BUN 23* 19 14 14   CALCIUM 7.4* 7.4* 7.7* 7.7*  CREATININE 0.43* 0.41* 0.43* 0.42*  GFRNONAA >60 >60 >60 >60  GFRAA >60 >60 >60 >60    LIVER FUNCTION TESTS:  Recent Labs  06/23/16 0326 06/24/16 0342 06/25/16 0510 06/26/16 0335  BILITOT 7.8*  7.8* 5.6* 5.5* 5.1*  AST 44* 49* 51* 38  ALT 109* 89* 87* 76*  ALKPHOS 206* 238* 258* 231*  PROT 4.6* 5.2* 4.9* 5.2*  ALBUMIN 1.9* 2.1* 2.1* 2.1*    Assessment and Plan:  Hepatic abscess drain intact- placed 7/10 No change on CT 7/14 Will  follow  Electronically Signed: Will Schier A 06/26/2016, 10:10 AM   I spent a total of 15 Minutes at the the patient's bedside AND on the patient's hospital floor or unit, greater than 50% of which was counseling/coordinating care for hepatic abscess drain

## 2016-06-26 NOTE — Progress Notes (Signed)
Hematology Oncology Inpatient Progress Note  Date of service: 06/26/2016   Subjective:   Patient notes abdominal pain is gradually improving. 2 daughters at bedside. No other acute new symptoms. Hgb 7.8. Patient notes some mild fatigue but wants to hold off on PRBC transfusion at this time. No evidence of overt bleeding.  Objective: Vital signs in last 24 hours: Blood pressure 109/41, pulse 89, temperature 98.2 F (36.8 C), temperature source Oral, resp. rate 17, height 5' 3.5" (1.613 m), weight 144 lb 2.9 oz (65.4 kg), SpO2 96 %.  Intake/Output from previous day: 07/14 0701 - 07/15 0700 In: 625 [P.O.:360; IV Piggyback:250] Out: 4492 [Urine:1525; Drains:30]  Physical Exam:  MMM Anicteric sclera CVS s1s2 RRR RS- CTA b/l Abdomen: No hepatomegaly, right upper quadrant drain with a gauze dressing, tender in the right subcostal region, no mass, the abdomen is mildly distended Vascular: Pitting edema at the arms and lower legs/feet   Lab Results:  . CBC Latest Ref Rng 06/26/2016 06/25/2016 06/25/2016  WBC 4.0 - 10.5 K/uL 48.3(H) 52.1(HH) 44.9(H)  Hemoglobin 12.0 - 15.0 g/dL 7.8(L) 8.3(L) 7.8(L)  Hematocrit 36.0 - 46.0 % 22.1(L) 23.1(L) 21.9(L)  Platelets 150 - 400 K/uL 648(H) 645(H) 582(H)   . CBC    Component Value Date/Time   WBC 48.3* 06/26/2016 0335   RBC 2.50* 06/26/2016 0335   RBC 2.16* 06/23/2016 0326   HGB 7.8* 06/26/2016 0335   HCT 22.1* 06/26/2016 0335   PLT 648* 06/26/2016 0335   MCV 88.4 06/26/2016 0335   MCH 31.2 06/26/2016 0335   MCHC 35.3 06/26/2016 0335   RDW 15.1 06/26/2016 0335   LYMPHSABS 0.6* 06/19/2016 1256   MONOABS 2.0* 06/19/2016 1256   EOSABS 0.0 06/19/2016 1256   BASOSABS 0.0 06/19/2016 1256     . CMP Latest Ref Rng 06/26/2016 06/25/2016 06/24/2016  Glucose 65 - 99 mg/dL 108(H) 107(H) 119(H)  BUN 6 - 20 mg/dL 14 14 19   Creatinine 0.44 - 1.00 mg/dL 0.42(L) 0.43(L) 0.41(L)  Sodium 135 - 145 mmol/L 129(L) 128(L) 127(L)  Potassium 3.5 - 5.1  mmol/L 3.2(L) 3.7 4.7  Chloride 101 - 111 mmol/L 94(L) 96(L) 99(L)  CO2 22 - 32 mmol/L 27 25 22   Calcium 8.9 - 10.3 mg/dL 7.7(L) 7.7(L) 7.4(L)  Total Protein 6.5 - 8.1 g/dL 5.2(L) 4.9(L) 5.2(L)  Total Bilirubin 0.3 - 1.2 mg/dL 5.1(H) 5.5(H) 5.6(H)  Alkaline Phos 38 - 126 U/L 231(H) 258(H) 238(H)  AST 15 - 41 U/L 38 51(H) 49(H)  ALT 14 - 54 U/L 76(H) 87(H) 89(H)   Component     Latest Ref Rng 06/21/2016  IgG (Immunoglobin G), Serum     700 - 1600 mg/dL 307 (L)  IgA     64 - 422 mg/dL 59 (L)  IgM, Serum     26 - 217 mg/dL 1351 (H)  Total Protein ELP     6.0 - 8.5 g/dL 5.2 (L)  Albumin SerPl Elph-Mcnc     2.9 - 4.4 g/dL 2.4 (L)  Alpha 1     0.0 - 0.4 g/dL 0.5 (H)  Alpha2 Glob SerPl Elph-Mcnc     0.4 - 1.0 g/dL 0.5  B-Globulin SerPl Elph-Mcnc     0.7 - 1.3 g/dL 0.6 (L)  Gamma Glob SerPl Elph-Mcnc     0.4 - 1.8 g/dL 1.2  M Protein SerPl Elph-Mcnc     Not Observed g/dL 0.8 (H)  Globulin, Total     2.2 - 3.9 g/dL 2.8  Albumin/Glob SerPl  0.7 - 1.7 0.9  IFE 1      Comment  Please Note (HCV):      Comment  Kappa free light chain     3.3 - 19.4 mg/L 28.0 (H)  Lamda free light chains     5.7 - 26.3 mg/L 11.6  Kappa, lamda light chain ratio     0.26 - 1.65 2.41 (H)  C3 Complement     82 - 167 mg/dL 94  Complement C4, Body Fluid     14 - 44 mg/dL <2 (L)  Cold Agglutinin Titer     Neg <1:32 >1:4096   Comment  Multiple Myeloma Panel (SPEP&IFE w/QIG)      The value has a corrected status.      No reference range information available      Comments:           (NOTE)           Immunofixation shows IgM monoclonal protein with kappa light           chain           specificity.             . Current facility-administered medications:  .  0.9 %  sodium chloride infusion, , Intravenous, Once, Belkys A Regalado, MD .  0.9 %  sodium chloride infusion, , Intravenous, Once, Lily Kocher, MD .  ALPRAZolam Duanne Moron) tablet 0.5 mg, 0.5 mg, Oral, QHS PRN, Donne Hazel, MD, 0.5  mg at 06/25/16 2245 .  antiseptic oral rinse (CPC / CETYLPYRIDINIUM CHLORIDE 0.05%) solution 7 mL, 7 mL, Mouth Rinse, BID, Belkys A Regalado, MD, 7 mL at 06/26/16 1023 .  benzonatate (TESSALON) capsule 100 mg, 100 mg, Oral, TID PRN, Belkys A Regalado, MD, 100 mg at 06/25/16 2245 .  conjugated estrogens (PREMARIN) vaginal cream 1 Applicatorful, 1 Applicatorful, Vaginal, Once per day on Mon Wed Fri, Elmarie Shiley, MD, 1 Applicatorful at 76/16/07 1102 .  diphenhydrAMINE (BENADRYL) capsule 25 mg, 25 mg, Oral, QHS PRN, Belkys A Regalado, MD, 25 mg at 06/21/16 2233 .  feeding supplement (ENSURE ENLIVE) (ENSURE ENLIVE) liquid 237 mL, 237 mL, Oral, TID BM, Saima Rizwan, MD, 237 mL at 06/26/16 1023 .  feeding supplement (PRO-STAT SUGAR FREE 64) liquid 30 mL, 30 mL, Oral, BID, Debbe Odea, MD, 30 mL at 06/24/16 2156 .  fentaNYL (SUBLIMAZE) injection 12.5 mcg, 12.5 mcg, Intravenous, Q4H PRN, Debbe Odea, MD, 12.5 mcg at 06/25/16 2255 .  fluconazole (DIFLUCAN) IVPB 100 mg, 100 mg, Intravenous, Q24H, Saima Rizwan, MD, 100 mg at 06/26/16 1024 .  folic acid (FOLVITE) tablet 1 mg, 1 mg, Oral, Daily, Ladell Pier, MD, 1 mg at 06/26/16 1022 .  ibuprofen (ADVIL,MOTRIN) tablet 400 mg, 400 mg, Oral, Q6H PRN, Belkys A Regalado, MD .  metoprolol succinate (TOPROL-XL) 24 hr tablet 25 mg, 25 mg, Oral, Daily, Belkys A Regalado, MD, 25 mg at 06/26/16 1022 .  multivitamin with minerals tablet 1 tablet, 1 tablet, Oral, Daily, Belkys A Regalado, MD, 1 tablet at 06/26/16 1024 .  ondansetron (ZOFRAN) tablet 4 mg, 4 mg, Oral, Q6H PRN, 4 mg at 06/21/16 0914 **OR** ondansetron (ZOFRAN) injection 4 mg, 4 mg, Intravenous, Q6H PRN, Belkys A Regalado, MD, 4 mg at 06/20/16 2105 .  ondansetron (ZOFRAN) tablet 8 mg, 8 mg, Oral, Q8H PRN, Belkys A Regalado, MD, 8 mg at 06/22/16 1835 .  piperacillin-tazobactam (ZOSYN) IVPB 3.375 g, 3.375 g, Intravenous, Q8H, Carmin Muskrat, MD, 3.375 g at 06/26/16  1771 .  polyethylene glycol (MIRALAX  / GLYCOLAX) packet 17 g, 17 g, Oral, BID, Amy S Esterwood, PA-C, 17 g at 06/26/16 1022 .  potassium chloride SA (K-DUR,KLOR-CON) CR tablet 40 mEq, 40 mEq, Oral, Q4H, Debbe Odea, MD, 40 mEq at 06/26/16 1023 .  ranitidine (ZANTAC) 150 MG/10ML syrup 150 mg, 150 mg, Oral, BID, Debbe Odea, MD, 150 mg at 06/26/16 1022 .  saccharomyces boulardii (FLORASTOR) capsule 250 mg, 250 mg, Oral, BID, Donne Hazel, MD, 250 mg at 06/26/16 1022 .  simethicone (MYLICON) chewable tablet 80 mg, 80 mg, Oral, QID PRN, Donne Hazel, MD, 80 mg at 06/25/16 1303 .  simethicone (MYLICON) chewable tablet 80 mg, 80 mg, Oral, QID, Debbe Odea, MD, 80 mg at 06/26/16 1023 .  sodium chloride flush (NS) 0.9 % injection 3 mL, 3 mL, Intravenous, Q12H, Belkys A Regalado, MD, 3 mL at 06/26/16 1024 .  sodium chloride tablet 1 g, 1 g, Oral, BID WC, Debbe Odea, MD, 1 g at 06/25/16 1700   Assessment/Plan:   1. 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 High cold agglutinin titers Additional factors - sepsis, blood loss from lab draw/drain placement, abx  2. IgM Kappa monoclonal gammopathy - could be related to lymphoplasmacytic lymphoma/Waldenstrom's macroglobulinemia.  3. Hepatic Abscess/Sepsis/Elevated LFts  4. Hyponatremia, hyperkalemia - ?adrenal insufficiency , dehydration/ malnutrition and other medical issues -being managed by hospitalist team.  PLAN - keep patient warm and use gloves -if hypothermia from sepsis -continue body warmer -aggressive treatment of infections (since this contribute to complement load) and worsen cold agglutinin related hemolysis. -transfuse prn to maintain Hgb >8 or if patient symptomatic. Please use pre-warmed PRBCs for transfusion to reduce cold agglutinin related hemolysis -hgb relatively stable today. Continue daily Hgb -eventually depending on goals of care and overall health status might need  Bone marrow biopsy and PET/CT to r/o lymphoproliferative process /LPL. -folic acid and b complex to support accelerated hematopoiesis. -other management per hospitalist and ID teams. -we shall continue to follow. Plz call if questions  Sullivan Lone, MD   06/26/2016, 10:59 AM

## 2016-06-26 NOTE — Progress Notes (Signed)
[facility] Progress Note   Brianna [name]  [id number]  DOB: 10-08-1924  DOA: 06/19/2016 PCP: Tawanna Solo, MD  Brief narrative: Brianna [name] is a 82 y.o. female with HTN, cold agglutinin disease presenting with temp of 103, jaundice and dark urine and weight loss of about 10 lbs. She had been feeling bad since 7/5 and was seen in the ER for syncope and discharged home. She returned to there ER after she was noted to be jaundiced. She was found to have elevated LFTS, WBC count of 40 and a CT scan showing right lobe hepatic abscesses. She also had a sodium of 125 and potassium of 5.7. Tylenol level was normal.    Subjective: Feels cold. No other complaints.   Assessment/Plan: Principal Problem:   Hepatic abscess/Sepsis/ Elevated LFTs -   Zosyn - drain placed on 7/10 per IR - f/u cultures - ID consulted- antibiotics per ID - repeat CT not showing any change in abscesses   Active Problems:  Anemia- h/o cold agglutinin disease - Hb dropped from 9 to 5.6 on 7/9- transfused 3 U PRBC - Hb again dropped to 6.7 on 7/12-   no signs of bleeding- transfused 2 U PRBC - cold agglutinin titer > 1: 4096 - last LDH and haptoglobin earlier in on the admission were not consistent with hemolysis  - oncology assisting with management- questioning underlying proliferative disorder  Hyponatremia - U sodium < 10   - due to dehydration, hypoalbuminemia and third spacing - unfortunately, she is third spacing most of the IV fluid given - stopped IVF and started Lasix  - she had 3-4 L diuresis but no change in sodium yet- hold Lasix to prevent dehydration - cont follow I and O  started sodium tabs today  Hypoalbuminemia/ severe protein calorie malnutrition/ anasarca  - due to abscesses and poor appetite - Boost is causing heartburn - changed to Ensure - added Prostat  Hypoxia on exertion - - pulm edema due to third spacing and fluid overload - atelectasis due to being bed  bound - started Lasix- has improved - IS  Abdominal pain -  gas like   - Gas x      Benign essential HTN   - Metoprolol    Hyperkalemia - cause? - resolved    Antibiotics: Anti-infectives    Start     Dose/Rate Route Frequency Ordered Stop   06/25/16 0930  fluconazole (DIFLUCAN) IVPB 100 mg     100 mg 50 mL/hr over 60 Minutes Intravenous Every 24 hours 06/25/16 0841     06/20/16 1400  vancomycin (VANCOCIN) 500 mg in sodium chloride 0.9 % 100 mL IVPB  Status:  Discontinued     500 mg 100 mL/hr over 60 Minutes Intravenous Every 24 hours 06/19/16 1254 06/23/16 1550   06/19/16 2200  piperacillin-tazobactam (ZOSYN) IVPB 3.375 g     3.375 g 12.5 mL/hr over 240 Minutes Intravenous Every 8 hours 06/19/16 1254     06/19/16 1245  piperacillin-tazobactam (ZOSYN) IVPB 3.375 g     3.375 g 100 mL/hr over 30 Minutes Intravenous  Once 06/19/16 1242 06/19/16 1335   06/19/16 1245  vancomycin (VANCOCIN) IVPB 1000 mg/200 mL premix     1,000 mg 200 mL/hr over 60 Minutes Intravenous  Once 06/19/16 1242 06/19/16 1456     Code Status:     Code Status Orders        Start     Ordered   06/19/16 1721  Do not attempt resuscitation (  DNR)   Continuous    Question Answer Comment  In the event of cardiac or respiratory ARREST Do not call a "code blue"   In the event of cardiac or respiratory ARREST Do not perform Intubation, CPR, defibrillation or ACLS   In the event of cardiac or respiratory ARREST Use medication by any route, position, wound care, and other measures to relive pain and suffering. May use oxygen, suction and manual treatment of airway obstruction as needed for comfort.      06/19/16 1720    Code Status History    Date Active Date Inactive Code Status Order ID Comments User Context   12/10/2013  5:39 AM 12/13/2013  2:12 PM Full Code [id number]  Etta Quill, DO ED     Family Communication: daughters Disposition Plan: to be determined- cont to follow in SDU DVT prophylaxis:  SCDS Consultants: hematology, GI, ID Procedures: RUQ drain    Objective: Filed Weights   06/22/16 0600 06/24/16 0845 06/25/16 0500  Weight: 65 kg (143 lb 4.8 oz) 67.9 kg (149 lb 11.1 oz) 65.4 kg (144 lb 2.9 oz)    Intake/Output Summary (Last 24 hours) at 06/26/16 1755 Last data filed at 06/26/16 1056  Gross per 24 hour  Intake    590 ml  Output    680 ml  Net    -90 ml     Vitals Filed Vitals:   06/26/16 0600 06/26/16 0800 06/26/16 1200 06/26/16 1600  BP: 109/41 136/55 126/110 119/41  Pulse: 89 101 104 89  Temp:  98.2 F (36.8 C) 97.9 F (36.6 C) 98.8 F (37.1 C)  TempSrc:  Oral Oral Oral  Resp: 17 25 17 21   Height:      Weight:      SpO2: 96% 99% 97% 93%    Exam:  General:  Pt is alert, not in acute distress  HEENT: +++ icterus, No thrush, oral mucosa moist  Cardiovascular: regular rate and rhythm, S1/S2 No murmur  Respiratory: clear to auscultation bilaterally   Abdomen: Soft, +Bowel sounds,   Tender in LUQ and RUQ, tympanic/  distended, no guarding  MSK: No cyanosis or clubbing- mild pedal edema   Data Reviewed: Basic Metabolic Panel:  Recent Labs Lab 06/22/16 0316 06/23/16 0326 06/24/16 0342 06/25/16 0510 06/26/16 0335  NA 126* 128* 127* 128* 129*  K 3.3* 4.1 4.7 3.7 3.2*  CL 96* 100* 99* 96* 94*  CO2 22 23 22 25 27   GLUCOSE 125* 123* 119* 107* 108*  BUN 20 23* 19 14 14   CREATININE 0.41* 0.43* 0.41* 0.43* 0.42*  CALCIUM 7.5* 7.4* 7.4* 7.7* 7.7*   Liver Function Tests:  Recent Labs Lab 06/22/16 0316 06/23/16 0326 06/24/16 0342 06/25/16 0510 06/26/16 0335  AST 72* 44* 49* 51* 38  ALT 142* 109* 89* 87* 76*  ALKPHOS 235* 206* 238* 258* 231*  BILITOT 13.3* 7.8*  7.8* 5.6* 5.5* 5.1*  PROT 5.5* 4.6* 5.2* 4.9* 5.2*  ALBUMIN 2.3* 1.9* 2.1* 2.1* 2.1*   No results for input(s): LIPASE, AMYLASE in the last 168 hours. No results for input(s): AMMONIA in the last 168 hours. CBC:  Recent Labs Lab 06/23/16 0423 06/24/16 0342  06/25/16 0510 06/25/16 1700 06/26/16 0335  WBC 34.1* 49.7* 44.9* 52.1* 48.3*  HGB 6.7* 9.1* 7.8* 8.3* 7.8*  HCT 18.9* 25.1* 21.9* 23.1* 22.1*  MCV 87.5 89.3 88.3 89.5 88.4  PLT 479* 506* 582* 645* 648*   Cardiac Enzymes: No results for input(s): CKTOTAL, CKMB, CKMBINDEX, TROPONINI in  the last 168 hours. BNP (last 3 results) No results for input(s): BNP in the last 8760 hours.  ProBNP (last 3 results) No results for input(s): PROBNP in the last 8760 hours.  CBG: No results for input(s): GLUCAP in the last 168 hours.  Recent Results (from the past 240 hour(s))  Culture, blood (Routine X 2)     Status: None   Collection Time: 06/19/16 12:56 PM  Result Value Ref Range Status   Specimen Description BLOOD RIGHT ARM  Final   Special Requests BOTTLES DRAWN AEROBIC AND ANAEROBIC 5CC  Final   Culture   Final    NO GROWTH 5 DAYS Performed at [facility]    Report Status 06/24/2016 FINAL  Final  Culture, blood (Routine X 2)     Status: None   Collection Time: 06/19/16 12:56 PM  Result Value Ref Range Status   Specimen Description BLOOD LEFT HAND  Final   Special Requests BOTTLES DRAWN AEROBIC AND ANAEROBIC 5CC  Final   Culture   Final    NO GROWTH 5 DAYS Performed at [facility]    Report Status 06/24/2016 FINAL  Final  Urine culture     Status: Abnormal   Collection Time: 06/19/16  1:31 PM  Result Value Ref Range Status   Specimen Description URINE, CLEAN CATCH  Final   Special Requests NONE  Final   Culture (A)  Final    <10,000 COLONIES/mL INSIGNIFICANT GROWTH Performed at [facility]    Report Status 06/20/2016 FINAL  Final  MRSA PCR Screening     Status: None   Collection Time: 06/19/16  5:18 PM  Result Value Ref Range Status   MRSA by PCR NEGATIVE NEGATIVE Final    Comment:        The GeneXpert MRSA Assay (FDA approved for NASAL specimens only), is one component of a comprehensive MRSA colonization surveillance program. It is  not intended to diagnose MRSA infection nor to guide or monitor treatment for MRSA infections.   Aerobic/Anaerobic Culture (surgical/deep wound)     Status: None (Preliminary result)   Collection Time: 06/21/16  4:40 PM  Result Value Ref Range Status   Specimen Description LIVER ABSCESS  Final   Special Requests NONE  Final   Gram Stain   Final    ABUNDANT WBC PRESENT, PREDOMINANTLY PMN NO ORGANISMS SEEN    Culture   Final    HOLDING FOR POSSIBLE ANAEROBE Performed at [facility]    Report Status PENDING  Incomplete     Studies: Ct Abdomen Pelvis W Contrast  06/25/2016  CLINICAL DATA:  82 year old female with hypertension. Jaundice. Loss of weight. Fevers. EXAM: CT ABDOMEN AND PELVIS WITH CONTRAST TECHNIQUE: Multidetector CT imaging of the abdomen and pelvis was performed using the standard protocol following bolus administration of intravenous contrast. CONTRAST:  143mL ISOVUE-300 IOPAMIDOL (ISOVUE-300) INJECTION 61% COMPARISON:  06/19/2016 FINDINGS: Lower chest: There are bilateral pleural effusions, right greater than left. These appear increased in volume when compared with previous exam. Hepatobiliary: Liver abscess ease are again identified. A percutaneous drainage catheter is in place. The larger of the 2 abscesses measures 4.8 cm, image number 48 of series 2. Previously 4.7 cm. The more lateral abscess measures 3.7 cm, image 34 of series 2. Previously 4.2 cm. No new fluid collections identified. Pancreas: There are a few scattered low-density lesions within the pancreas which are not changed from previous exam. The largest is in the distal tail measuring 7 mm,  image 43 of series 2. Spleen: The spleen appears normal. Adrenals/Urinary Tract: Indeterminate, intermediate attenuating lesion in the mid left kidney measures 1.7 cm and 39 HU, image 45 of series 2. Unchanged from previous study. The bladder is normal. Stomach/Bowel: The stomach is normal. There is no pathologic  dilatation of the small bowel loops. Moderate stool burden is identified throughout the colon. No pathologic dilatation of the large bowel loops identified. Vascular/Lymphatic: Calcified atherosclerotic disease involves the abdominal aorta. No aneurysm. No enlarged retroperitoneal or mesenteric adenopathy. No enlarged pelvic or inguinal lymph nodes. Reproductive: The uterus and adnexal structures are unremarkable. Other: There is a small amount of ascites noted within the dependent portion of the pelvis. The diffuse body wall edema is identified. A small amount of perihepatic ascites noted. Musculoskeletal: No aggressive lytic or sclerotic bone lesions. The bones appear osteopenic. There is a stable compression fractures involve T11 and L1. Degenerative disc disease is noted at L4-5. IMPRESSION: 1. Two liver abscesses are again identified within the right lobe. Overall there has been no significant change in size of these complex fluid collections. The percutaneous drainage catheter remains in appropriate position. 2. Similar appearance of indeterminate low-attenuation lesions within the spleen an intermediate attenuating foci within the left kidney. Although favored to represent benign foci these will require nonemergent follow-up imaging with contrast enhanced MRI of the upper abdomen per for bili performed following resolution of the patient's current clinical condition on an outpatient basis. 3. Aortic atherosclerosis 4. Increase in pleural effusions and body wall edema. Electronically Signed   By: Kerby Moors M.D.   On: 06/25/2016 18:25    Scheduled Meds:  Scheduled Meds: . sodium chloride   Intravenous Once  . sodium chloride   Intravenous Once  . antiseptic oral rinse  7 mL Mouth Rinse BID  . conjugated estrogens  1 Applicatorful Vaginal Once per day on Mon Wed Fri  . feeding supplement (ENSURE ENLIVE)  237 mL Oral TID BM  . feeding supplement (PRO-STAT SUGAR FREE 64)  30 mL Oral BID  .  fluconazole (DIFLUCAN) IV  100 mg Intravenous Q24H  . folic acid  1 mg Oral Daily  . metoprolol succinate  25 mg Oral Daily  . multivitamin with minerals  1 tablet Oral Daily  . piperacillin-tazobactam (ZOSYN)  IV  3.375 g Intravenous Q8H  . polyethylene glycol  17 g Oral BID  . ranitidine  150 mg Oral BID  . saccharomyces boulardii  250 mg Oral BID  . simethicone  80 mg Oral QID  . sodium chloride flush  3 mL Intravenous Q12H  . sodium chloride  1 g Oral BID WC   Continuous Infusions:    Time spent on care of this patient: 35 min   [place], MD 06/26/2016, 5:55 PM  LOS: 7 days   [facility] Office  [phone] Pager - Text Page per [email or web address] If 7PM-7AM, please contact night-coverage [email or web address]

## 2016-06-27 DIAGNOSIS — D472 Monoclonal gammopathy: Secondary | ICD-10-CM

## 2016-06-27 DIAGNOSIS — D72829 Elevated white blood cell count, unspecified: Secondary | ICD-10-CM | POA: Insufficient documentation

## 2016-06-27 LAB — COMPREHENSIVE METABOLIC PANEL
ALK PHOS: 210 U/L — AB (ref 38–126)
ALT: 63 U/L — AB (ref 14–54)
AST: 35 U/L (ref 15–41)
Albumin: 2.1 g/dL — ABNORMAL LOW (ref 3.5–5.0)
Anion gap: 6 (ref 5–15)
BILIRUBIN TOTAL: 3.8 mg/dL — AB (ref 0.3–1.2)
BUN: 14 mg/dL (ref 6–20)
CALCIUM: 7.7 mg/dL — AB (ref 8.9–10.3)
CO2: 30 mmol/L (ref 22–32)
CREATININE: 0.54 mg/dL (ref 0.44–1.00)
Chloride: 93 mmol/L — ABNORMAL LOW (ref 101–111)
GFR calc Af Amer: >60 mL/min (ref >60)
Glucose, Bld: 107 mg/dL — ABNORMAL HIGH (ref 65–99)
Potassium: 3.9 mmol/L (ref 3.5–5.1)
Sodium: 129 mmol/L — ABNORMAL LOW (ref 135–145)
TOTAL PROTEIN: 4.9 g/dL — AB (ref 6.5–8.1)

## 2016-06-27 LAB — CBC WITH DIFFERENTIAL/PLATELET
BASOS PCT: 0 %
Band Neutrophils: 7 %
Basophils Absolute: 0 10*3/uL (ref 0.0–0.1)
Blasts: 0 %
EOS ABS: 0.4 10*3/uL (ref 0.0–0.7)
EOS PCT: 1 %
HCT: 19.3 % — ABNORMAL LOW (ref 36.0–46.0)
HEMOGLOBIN: 6.7 g/dL — AB (ref 12.0–15.0)
LYMPHS ABS: 5 10*3/uL — AB (ref 0.7–4.0)
Lymphocytes Relative: 13 %
MCH: 31.2 pg (ref 26.0–34.0)
MCHC: 34.7 g/dL (ref 30.0–36.0)
MCV: 89.8 fL (ref 78.0–100.0)
MONO ABS: 2.7 10*3/uL — AB (ref 0.1–1.0)
Metamyelocytes Relative: 3 %
Monocytes Relative: 7 %
Myelocytes: 3 %
NEUTROS PCT: 65 %
NRBC: 0 /100{WBCs}
Neutro Abs: 30 10*3/uL — ABNORMAL HIGH (ref 1.7–7.7)
OTHER: 0 %
PLATELETS: 680 10*3/uL — AB (ref 150–400)
Promyelocytes Absolute: 1 %
RBC: 2.15 MIL/uL — AB (ref 3.87–5.11)
RDW: 15 % (ref 11.5–15.5)
WBC: 38.1 10*3/uL — AB (ref 4.0–10.5)

## 2016-06-27 LAB — PREPARE RBC (CROSSMATCH)

## 2016-06-27 LAB — LACTATE DEHYDROGENASE: LDH: 205 U/L — AB (ref 98–192)

## 2016-06-27 MED ORDER — PIPERACILLIN-TAZOBACTAM 3.375 G IVPB 30 MIN
3.3750 g | Freq: Once | INTRAVENOUS | Status: AC
  Administered 2016-06-27: 3.375 g via INTRAVENOUS
  Filled 2016-06-27 (×2): qty 50

## 2016-06-27 MED ORDER — SODIUM CHLORIDE 0.9 % IV SOLN: Freq: Once | INTRAVENOUS | Status: DC

## 2016-06-27 MED ORDER — FLUCONAZOLE 100 MG PO TABS
100.0000 mg | ORAL_TABLET | Freq: Every day | ORAL | Status: DC
Start: 2016-06-27 — End: 2016-07-06
  Administered 2016-06-27 – 2016-07-06 (×9): 100 mg via ORAL
  Filled 2016-06-27 (×9): qty 1

## 2016-06-27 MED ORDER — FUROSEMIDE 10 MG/ML IJ SOLN
40.0000 mg | Freq: Once | INTRAMUSCULAR | Status: AC
  Administered 2016-06-27: 40 mg via INTRAVENOUS
  Filled 2016-06-27: qty 4

## 2016-06-27 MED ORDER — PIPERACILLIN-TAZOBACTAM 3.375 G IVPB
3.3750 g | Freq: Three times a day (TID) | INTRAVENOUS | Status: DC
  Administered 2016-06-27 – 2016-06-30 (×9): 3.375 g via INTRAVENOUS
  Filled 2016-06-27 (×8): qty 50

## 2016-06-27 MED ORDER — SODIUM CHLORIDE 0.9 % IV SOLN
Freq: Once | INTRAVENOUS | Status: AC
  Administered 2016-06-27: 11:00:00 via INTRAVENOUS

## 2016-06-27 NOTE — Progress Notes (Signed)
[facility] Progress Note   Brianna [name]  [id number]  DOB: Dec 14, 1923  DOA: 06/19/2016 PCP: Tawanna Solo, MD  Brief narrative: Brianna [name] is a 82 y.o. female with HTN, cold agglutinin disease presenting with temp of 103, jaundice and dark urine and weight loss of about 10 lbs. She had been feeling bad since 7/5 and was seen in the ER for syncope and discharged home. She returned to there ER after she was noted to be jaundiced. She was found to have elevated LFTS, WBC count of 40 and a CT scan showing right lobe hepatic abscesses. She also had a sodium of 125 and potassium of 5.7. Tylenol level was normal.    Subjective: No complaints today.   Assessment/Plan: Principal Problem:   Hepatic abscess/Sepsis/ Elevated LFTs -   Zosyn - drain placed on 7/10 per IR - f/u cultures - ID consulted- antibiotics per ID - repeat CT not showing any change in abscesses   Active Problems:  Anemia- h/o cold agglutinin disease - Hb dropped from 9 to 5.6 on 7/9- transfused 3 U PRBC - Hb again dropped to 6.7 on 7/12-   no signs of bleeding- transfused 2 U PRBC - Hb 6.7 today- transfuse 2 more U PRBC - cold agglutinin titer > 1: 4096 - last LDH and haptoglobin earlier in on the admission were not consistent with hemolysis  - oncology assisting with management- questioning underlying proliferative disorder  Hyponatremia - U sodium < 10   - due to dehydration, hypoalbuminemia and third spacing - unfortunately, she is third spacing most of the IV fluid given - stopped IVF and started Lasix  - she had 3-4 L diuresis but no change in sodium - hold Lasix to prevent dehydration - cont follow I and O  started sodium tabs    Hypoalbuminemia/ severe protein calorie malnutrition/ anasarca  - due to abscesses and poor appetite - Boost was causing heartburn - changed to Ensure - added Prostat  Hypoxia on exertion - - pulm edema due to third spacing and fluid overload - atelectasis  due to being bed bound - started Lasix- has improved - IS  Abdominal pain -  gas like   - Gas x      Benign essential HTN   - Metoprolol    Hyperkalemia - cause? - resolved    Antibiotics: Anti-infectives    Start     Dose/Rate Route Frequency Ordered Stop   06/27/16 1200  fluconazole (DIFLUCAN) tablet 100 mg     100 mg Oral Daily 06/27/16 1111     06/25/16 0930  fluconazole (DIFLUCAN) IVPB 100 mg  Status:  Discontinued     100 mg 50 mL/hr over 60 Minutes Intravenous Every 24 hours 06/25/16 0841 06/27/16 1110   06/20/16 1400  vancomycin (VANCOCIN) 500 mg in sodium chloride 0.9 % 100 mL IVPB  Status:  Discontinued     500 mg 100 mL/hr over 60 Minutes Intravenous Every 24 hours 06/19/16 1254 06/23/16 1550   06/19/16 2200  piperacillin-tazobactam (ZOSYN) IVPB 3.375 g     3.375 g 12.5 mL/hr over 240 Minutes Intravenous Every 8 hours 06/19/16 1254     06/19/16 1245  piperacillin-tazobactam (ZOSYN) IVPB 3.375 g     3.375 g 100 mL/hr over 30 Minutes Intravenous  Once 06/19/16 1242 06/19/16 1335   06/19/16 1245  vancomycin (VANCOCIN) IVPB 1000 mg/200 mL premix     1,000 mg 200 mL/hr over 60 Minutes Intravenous  Once 06/19/16 1242 06/19/16 1456  Code Status:     Code Status Orders        Start     Ordered   06/19/16 1721  Do not attempt resuscitation (DNR)   Continuous    Question Answer Comment  In the event of cardiac or respiratory ARREST Do not call a "code blue"   In the event of cardiac or respiratory ARREST Do not perform Intubation, CPR, defibrillation or ACLS   In the event of cardiac or respiratory ARREST Use medication by any route, position, wound care, and other measures to relive pain and suffering. May use oxygen, suction and manual treatment of airway obstruction as needed for comfort.      06/19/16 1720    Code Status History    Date Active Date Inactive Code Status Order ID Comments User Context   12/10/2013  5:39 AM 12/13/2013  2:12 PM Full Code  [id number]  Etta Quill, DO ED     Family Communication: daughters Disposition Plan: to be determined- cont to follow in SDU DVT prophylaxis: SCDS Consultants: hematology, GI, ID Procedures: RUQ drain    Objective: Filed Weights   06/25/16 0500 06/26/16 2325 06/27/16 0500  Weight: 65.4 kg (144 lb 2.9 oz) 65.9 kg (145 lb 4.5 oz) 64.3 kg (141 lb 12.1 oz)    Intake/Output Summary (Last 24 hours) at 06/27/16 1436 Last data filed at 06/27/16 0646  Gross per 24 hour  Intake      0 ml  Output    270 ml  Net   -270 ml     Vitals Filed Vitals:   06/27/16 0000 06/27/16 0347 06/27/16 0500 06/27/16 1406  BP: 126/42   142/47  Pulse: 88   89  Temp:  98.6 F (37 C)  98.6 F (37 C)  TempSrc:  Oral  Oral  Resp: 20   23  Height:      Weight:   64.3 kg (141 lb 12.1 oz)   SpO2: 97%   95%    Exam:  General:  Pt is alert, not in acute distress  HEENT: +++ icterus, No thrush, oral mucosa moist  Cardiovascular: regular rate and rhythm, S1/S2 No murmur  Respiratory: clear to auscultation bilaterally   Abdomen: Soft, +Bowel sounds, non distended, no guarding- drain in place with bloody fluid  MSK: No cyanosis or clubbing- mild pedal edema   Data Reviewed: Basic Metabolic Panel:  Recent Labs Lab 06/23/16 0326 06/24/16 0342 06/25/16 0510 06/26/16 0335 06/27/16 0325  NA 128* 127* 128* 129* 129*  K 4.1 4.7 3.7 3.2* 3.9  CL 100* 99* 96* 94* 93*  CO2 23 22 25 27 30   GLUCOSE 123* 119* 107* 108* 107*  BUN 23* 19 14 14 14   CREATININE 0.43* 0.41* 0.43* 0.42* 0.54  CALCIUM 7.4* 7.4* 7.7* 7.7* 7.7*   Liver Function Tests:  Recent Labs Lab 06/23/16 0326 06/24/16 0342 06/25/16 0510 06/26/16 0335 06/27/16 0325  AST 44* 49* 51* 38 35  ALT 109* 89* 87* 76* 63*  ALKPHOS 206* 238* 258* 231* 210*  BILITOT 7.8*  7.8* 5.6* 5.5* 5.1* 3.8*  PROT 4.6* 5.2* 4.9* 5.2* 4.9*  ALBUMIN 1.9* 2.1* 2.1* 2.1* 2.1*   No results for input(s): LIPASE, AMYLASE in the last 168 hours. No  results for input(s): AMMONIA in the last 168 hours. CBC:  Recent Labs Lab 06/25/16 0510 06/25/16 1700 06/26/16 0335 06/26/16 1648 06/27/16 0325  WBC 44.9* 52.1* 48.3* 47.4* 38.1*  NEUTROABS  --   --   --   --  30.0*  HGB 7.8* 8.3* 7.8* 7.6* 6.7*  HCT 21.9* 23.1* 22.1* 21.6* 19.3*  MCV 88.3 89.5 88.4 90.8 89.8  PLT 582* 645* 648* 711* 680*   Cardiac Enzymes: No results for input(s): CKTOTAL, CKMB, CKMBINDEX, TROPONINI in the last 168 hours. BNP (last 3 results) No results for input(s): BNP in the last 8760 hours.  ProBNP (last 3 results) No results for input(s): PROBNP in the last 8760 hours.  CBG: No results for input(s): GLUCAP in the last 168 hours.  Recent Results (from the past 240 hour(s))  Culture, blood (Routine X 2)     Status: None   Collection Time: 06/19/16 12:56 PM  Result Value Ref Range Status   Specimen Description BLOOD RIGHT ARM  Final   Special Requests BOTTLES DRAWN AEROBIC AND ANAEROBIC 5CC  Final   Culture   Final    NO GROWTH 5 DAYS Performed at [facility]    Report Status 06/24/2016 FINAL  Final  Culture, blood (Routine X 2)     Status: None   Collection Time: 06/19/16 12:56 PM  Result Value Ref Range Status   Specimen Description BLOOD LEFT HAND  Final   Special Requests BOTTLES DRAWN AEROBIC AND ANAEROBIC 5CC  Final   Culture   Final    NO GROWTH 5 DAYS Performed at [facility]    Report Status 06/24/2016 FINAL  Final  Urine culture     Status: Abnormal   Collection Time: 06/19/16  1:31 PM  Result Value Ref Range Status   Specimen Description URINE, CLEAN CATCH  Final   Special Requests NONE  Final   Culture (A)  Final    <10,000 COLONIES/mL INSIGNIFICANT GROWTH Performed at [facility]    Report Status 06/20/2016 FINAL  Final  MRSA PCR Screening     Status: None   Collection Time: 06/19/16  5:18 PM  Result Value Ref Range Status   MRSA by PCR NEGATIVE NEGATIVE Final    Comment:        The GeneXpert  MRSA Assay (FDA approved for NASAL specimens only), is one component of a comprehensive MRSA colonization surveillance program. It is not intended to diagnose MRSA infection nor to guide or monitor treatment for MRSA infections.   Aerobic/Anaerobic Culture (surgical/deep wound)     Status: None (Preliminary result)   Collection Time: 06/21/16  4:40 PM  Result Value Ref Range Status   Specimen Description LIVER ABSCESS  Final   Special Requests NONE  Final   Gram Stain   Final    ABUNDANT WBC PRESENT, PREDOMINANTLY PMN NO ORGANISMS SEEN    Culture   Final    HOLDING FOR POSSIBLE ANAEROBE Performed at [facility]    Report Status PENDING  Incomplete     Studies: Ct Abdomen Pelvis W Contrast  06/25/2016  CLINICAL DATA:  82 year old female with hypertension. Jaundice. Loss of weight. Fevers. EXAM: CT ABDOMEN AND PELVIS WITH CONTRAST TECHNIQUE: Multidetector CT imaging of the abdomen and pelvis was performed using the standard protocol following bolus administration of intravenous contrast. CONTRAST:  135mL ISOVUE-300 IOPAMIDOL (ISOVUE-300) INJECTION 61% COMPARISON:  06/19/2016 FINDINGS: Lower chest: There are bilateral pleural effusions, right greater than left. These appear increased in volume when compared with previous exam. Hepatobiliary: Liver abscess ease are again identified. A percutaneous drainage catheter is in place. The larger of the 2 abscesses measures 4.8 cm, image number 48 of series 2. Previously 4.7 cm. The more lateral abscess measures 3.7  cm, image 34 of series 2. Previously 4.2 cm. No new fluid collections identified. Pancreas: There are a few scattered low-density lesions within the pancreas which are not changed from previous exam. The largest is in the distal tail measuring 7 mm, image 43 of series 2. Spleen: The spleen appears normal. Adrenals/Urinary Tract: Indeterminate, intermediate attenuating lesion in the mid left kidney measures 1.7 cm and 39 HU, image  45 of series 2. Unchanged from previous study. The bladder is normal. Stomach/Bowel: The stomach is normal. There is no pathologic dilatation of the small bowel loops. Moderate stool burden is identified throughout the colon. No pathologic dilatation of the large bowel loops identified. Vascular/Lymphatic: Calcified atherosclerotic disease involves the abdominal aorta. No aneurysm. No enlarged retroperitoneal or mesenteric adenopathy. No enlarged pelvic or inguinal lymph nodes. Reproductive: The uterus and adnexal structures are unremarkable. Other: There is a small amount of ascites noted within the dependent portion of the pelvis. The diffuse body wall edema is identified. A small amount of perihepatic ascites noted. Musculoskeletal: No aggressive lytic or sclerotic bone lesions. The bones appear osteopenic. There is a stable compression fractures involve T11 and L1. Degenerative disc disease is noted at L4-5. IMPRESSION: 1. Two liver abscesses are again identified within the right lobe. Overall there has been no significant change in size of these complex fluid collections. The percutaneous drainage catheter remains in appropriate position. 2. Similar appearance of indeterminate low-attenuation lesions within the spleen an intermediate attenuating foci within the left kidney. Although favored to represent benign foci these will require nonemergent follow-up imaging with contrast enhanced MRI of the upper abdomen per for bili performed following resolution of the patient's current clinical condition on an outpatient basis. 3. Aortic atherosclerosis 4. Increase in pleural effusions and body wall edema. Electronically Signed   By: Kerby Moors M.D.   On: 06/25/2016 18:25    Scheduled Meds:  Scheduled Meds: . sodium chloride   Intravenous Once  . sodium chloride   Intravenous Once  . antiseptic oral rinse  7 mL Mouth Rinse BID  . conjugated estrogens  1 Applicatorful Vaginal Once per day on Mon Wed Fri  .  feeding supplement (ENSURE ENLIVE)  237 mL Oral TID BM  . feeding supplement (PRO-STAT SUGAR FREE 64)  30 mL Oral BID  . fluconazole  100 mg Oral Daily  . folic acid  1 mg Oral Daily  . metoprolol succinate  25 mg Oral Daily  . multivitamin with minerals  1 tablet Oral Daily  . piperacillin-tazobactam (ZOSYN)  IV  3.375 g Intravenous Q8H  . polyethylene glycol  17 g Oral BID  . ranitidine  150 mg Oral BID  . saccharomyces boulardii  250 mg Oral BID  . simethicone  80 mg Oral QID  . sodium chloride flush  3 mL Intravenous Q12H  . sodium chloride  1 g Oral BID WC   Continuous Infusions:    Time spent on care of this patient: 35 min   [place], MD 06/27/2016, 2:36 PM  LOS: 8 days   [facility] Office  [phone] Pager - Text Page per [email or web address] If 7PM-7AM, please contact night-coverage [email or web address]

## 2016-06-27 NOTE — Progress Notes (Signed)
Subjective: Patient has some pain where her drain is inserted   Antibiotics:  Anti-infectives    Start     Dose/Rate Route Frequency Ordered Stop   06/27/16 1200  fluconazole (DIFLUCAN) tablet 100 mg     100 mg Oral Daily 06/27/16 1111     06/25/16 0930  fluconazole (DIFLUCAN) IVPB 100 mg  Status:  Discontinued     100 mg 50 mL/hr over 60 Minutes Intravenous Every 24 hours 06/25/16 0841 06/27/16 1110   06/20/16 1400  vancomycin (VANCOCIN) 500 mg in sodium chloride 0.9 % 100 mL IVPB  Status:  Discontinued     500 mg 100 mL/hr over 60 Minutes Intravenous Every 24 hours 06/19/16 1254 06/23/16 1550   06/19/16 2200  piperacillin-tazobactam (ZOSYN) IVPB 3.375 g     3.375 g 12.5 mL/hr over 240 Minutes Intravenous Every 8 hours 06/19/16 1254     06/19/16 1245  piperacillin-tazobactam (ZOSYN) IVPB 3.375 g     3.375 g 100 mL/hr over 30 Minutes Intravenous  Once 06/19/16 1242 06/19/16 1335   06/19/16 1245  vancomycin (VANCOCIN) IVPB 1000 mg/200 mL premix     1,000 mg 200 mL/hr over 60 Minutes Intravenous  Once 06/19/16 1242 06/19/16 1456      Medications: Scheduled Meds: . sodium chloride   Intravenous Once  . sodium chloride   Intravenous Once  . antiseptic oral rinse  7 mL Mouth Rinse BID  . conjugated estrogens  1 Applicatorful Vaginal Once per day on Mon Wed Fri  . feeding supplement (ENSURE ENLIVE)  237 mL Oral TID BM  . feeding supplement (PRO-STAT SUGAR FREE 64)  30 mL Oral BID  . fluconazole  100 mg Oral Daily  . folic acid  1 mg Oral Daily  . metoprolol succinate  25 mg Oral Daily  . multivitamin with minerals  1 tablet Oral Daily  . piperacillin-tazobactam (ZOSYN)  IV  3.375 g Intravenous Q8H  . polyethylene glycol  17 g Oral BID  . ranitidine  150 mg Oral BID  . saccharomyces boulardii  250 mg Oral BID  . simethicone  80 mg Oral QID  . sodium chloride flush  3 mL Intravenous Q12H  . sodium chloride  1 g Oral BID WC   Continuous Infusions:  PRN  Meds:.ALPRAZolam, benzonatate, diphenhydrAMINE, fentaNYL (SUBLIMAZE) injection, ibuprofen, ondansetron **OR** ondansetron (ZOFRAN) IV, ondansetron, simethicone    Objective: Weight change:   Intake/Output Summary (Last 24 hours) at 06/27/16 1515 Last data filed at 06/27/16 0646  Gross per 24 hour  Intake      0 ml  Output    270 ml  Net   -270 ml   Blood pressure 127/55, pulse 95, temperature 98.9 F (37.2 C), temperature source Oral, resp. rate 25, height 5' 3.5" (1.613 m), weight 141 lb 12.1 oz (64.3 kg), SpO2 94 %. Temp:  [97.6 F (36.4 C)-98.9 F (37.2 C)] 98.9 F (37.2 C) (07/16 1448) Pulse Rate:  [88-103] 95 (07/16 1448) Resp:  [20-25] 25 (07/16 1448) BP: (119-142)/(41-55) 127/55 mmHg (07/16 1448) SpO2:  [93 %-100 %] 94 % (07/16 1448) Weight:  [141 lb 12.1 oz (64.3 kg)-145 lb 4.5 oz (65.9 kg)] 141 lb 12.1 oz (64.3 kg) (07/16 0500)  Physical Exam: General: Alert and awake, oriented x3, not in any acute distress. HEENT: anicteric sclera, pupils reactive to light and accommodation, EOMI CVS regular rate, normal r,  no murmur rubs or gallops Chest: clear to auscultation bilaterally, no wheezing,  rales or rhonchi Abdomen:She has some tenderness around the drain insertion site Extremities: no  clubbing or edema noted bilaterally Skin: no rashes Neuro: nonfocal  CBC:  CBC Latest Ref Rng 06/27/2016 06/26/2016 06/26/2016  WBC 4.0 - 10.5 K/uL 38.1(H) 47.4(H) 48.3(H)  Hemoglobin 12.0 - 15.0 g/dL 6.7(LL) 7.6(L) 7.8(L)  Hematocrit 36.0 - 46.0 % 19.3(L) 21.6(L) 22.1(L)  Platelets 150 - 400 K/uL 680(H) 711(H) 648(H)       BMET  Recent Labs  06/26/16 0335 06/27/16 0325  NA 129* 129*  K 3.2* 3.9  CL 94* 93*  CO2 27 30  GLUCOSE 108* 107*  BUN 14 14  CREATININE 0.42* 0.54  CALCIUM 7.7* 7.7*     Liver Panel   Recent Labs  06/26/16 0335 06/27/16 0325  PROT 5.2* 4.9*  ALBUMIN 2.1* 2.1*  AST 38 35  ALT 76* 63*  ALKPHOS 231* 210*  BILITOT 5.1* 3.8*        Sedimentation Rate No results for input(s): ESRSEDRATE in the last 72 hours. C-Reactive Protein No results for input(s): CRP in the last 72 hours.  Micro Results: Recent Results (from the past 720 hour(s))  Culture, blood (Routine X 2)     Status: None   Collection Time: 06/19/16 12:56 PM  Result Value Ref Range Status   Specimen Description BLOOD RIGHT ARM  Final   Special Requests BOTTLES DRAWN AEROBIC AND ANAEROBIC 5CC  Final   Culture   Final    NO GROWTH 5 DAYS Performed at [facility]    Report Status 06/24/2016 FINAL  Final  Culture, blood (Routine X 2)     Status: None   Collection Time: 06/19/16 12:56 PM  Result Value Ref Range Status   Specimen Description BLOOD LEFT HAND  Final   Special Requests BOTTLES DRAWN AEROBIC AND ANAEROBIC 5CC  Final   Culture   Final    NO GROWTH 5 DAYS Performed at [facility]    Report Status 06/24/2016 FINAL  Final  Urine culture     Status: Abnormal   Collection Time: 06/19/16  1:31 PM  Result Value Ref Range Status   Specimen Description URINE, CLEAN CATCH  Final   Special Requests NONE  Final   Culture (A)  Final    <10,000 COLONIES/mL INSIGNIFICANT GROWTH Performed at [facility]    Report Status 06/20/2016 FINAL  Final  MRSA PCR Screening     Status: None   Collection Time: 06/19/16  5:18 PM  Result Value Ref Range Status   MRSA by PCR NEGATIVE NEGATIVE Final    Comment:        The GeneXpert MRSA Assay (FDA approved for NASAL specimens only), is one component of a comprehensive MRSA colonization surveillance program. It is not intended to diagnose MRSA infection nor to guide or monitor treatment for MRSA infections.   Aerobic/Anaerobic Culture (surgical/deep wound)     Status: None (Preliminary result)   Collection Time: 06/21/16  4:40 PM  Result Value Ref Range Status   Specimen Description LIVER ABSCESS  Final   Special Requests NONE  Final   Gram Stain   Final    ABUNDANT WBC  PRESENT, PREDOMINANTLY PMN NO ORGANISMS SEEN    Culture   Final    HOLDING FOR POSSIBLE ANAEROBE Performed at [facility]    Report Status PENDING  Incomplete    Studies/Results: Ct Abdomen Pelvis W Contrast  06/25/2016  CLINICAL DATA:  82 year old female with hypertension. Jaundice. Loss  of weight. Fevers. EXAM: CT ABDOMEN AND PELVIS WITH CONTRAST TECHNIQUE: Multidetector CT imaging of the abdomen and pelvis was performed using the standard protocol following bolus administration of intravenous contrast. CONTRAST:  132m ISOVUE-300 IOPAMIDOL (ISOVUE-300) INJECTION 61% COMPARISON:  06/19/2016 FINDINGS: Lower chest: There are bilateral pleural effusions, right greater than left. These appear increased in volume when compared with previous exam. Hepatobiliary: Liver abscess ease are again identified. A percutaneous drainage catheter is in place. The larger of the 2 abscesses measures 4.8 cm, image number 48 of series 2. Previously 4.7 cm. The more lateral abscess measures 3.7 cm, image 34 of series 2. Previously 4.2 cm. No new fluid collections identified. Pancreas: There are a few scattered low-density lesions within the pancreas which are not changed from previous exam. The largest is in the distal tail measuring 7 mm, image 43 of series 2. Spleen: The spleen appears normal. Adrenals/Urinary Tract: Indeterminate, intermediate attenuating lesion in the mid left kidney measures 1.7 cm and 39 HU, image 45 of series 2. Unchanged from previous study. The bladder is normal. Stomach/Bowel: The stomach is normal. There is no pathologic dilatation of the small bowel loops. Moderate stool burden is identified throughout the colon. No pathologic dilatation of the large bowel loops identified. Vascular/Lymphatic: Calcified atherosclerotic disease involves the abdominal aorta. No aneurysm. No enlarged retroperitoneal or mesenteric adenopathy. No enlarged pelvic or inguinal lymph nodes. Reproductive: The  uterus and adnexal structures are unremarkable. Other: There is a small amount of ascites noted within the dependent portion of the pelvis. The diffuse body wall edema is identified. A small amount of perihepatic ascites noted. Musculoskeletal: No aggressive lytic or sclerotic bone lesions. The bones appear osteopenic. There is a stable compression fractures involve T11 and L1. Degenerative disc disease is noted at L4-5. IMPRESSION: 1. Two liver abscesses are again identified within the right lobe. Overall there has been no significant change in size of these complex fluid collections. The percutaneous drainage catheter remains in appropriate position. 2. Similar appearance of indeterminate low-attenuation lesions within the spleen an intermediate attenuating foci within the left kidney. Although favored to represent benign foci these will require nonemergent follow-up imaging with contrast enhanced MRI of the upper abdomen per for bili performed following resolution of the patient's current clinical condition on an outpatient basis. 3. Aortic atherosclerosis 4. Increase in pleural effusions and body wall edema. Electronically Signed   By: TKerby MoorsM.D.   On: 06/25/2016 18:25      Assessment/Plan:  INTERVAL HISTORY:   06/26/16: repeat CT with little change of abscess cavity, WBC continues to fluctuate   Principal Problem:   Hepatic abscess Active Problems:   Spinal stenosis of lumbar region   Benign essential HTN   Sepsis (HCC)   Hyponatremia   Hyperkalemia   Jaundice   Elevated LFTs   Malnutrition of moderate degree   Hypoxia   Liver abscess   Hemolytic anemia (HCC)   Cold agglutinin disease (HCC)   IgM monoclonal gammopathy of uncertain significance    EAvnoor Kouryis a 82y.o. female with   Hx of IgM monoclonal gammopathy, HTN, prior appendectomy, prior diverticulitis admitted after syncopal episode brought to ED with sepsis and started on broad spectrum abx, found to have  hepatic abscess sp IR guided aspirate with culture NG (due to antecedent abx) She has a new cold agglutinin mediated anemia. WBC continues to go up and down between 30 and 50 k  #1 Liver abscess:  Would NOT broaden current  coverage  I would ensure that drainage is being accomplished  #2 Leukocytosis: I think she likely has some type of underlying pathology of her bone marrow that is causing the significant fluctuations in her white blood cell count in addition to her reactive neutrophilia in the context of a liver abscess. Would defer to hematology oncology and primary team with regards to consideration of a bone marrow biopsy for example.  #3 probiotic use: Florastor should NEVER be used in ICU population and not in the elderly or at all IMO. There is RISK of dissemianted fungemia to THIS patient from this "probiotic" and no proof of benefit in adult patients with re to CDI prevention the last time I reviewed the data. I will DC it.  I spent greater than 35  minutes with the patient including greater than 50% of time in face to face counsel of the patient daughter and grand-daughter re her liver abscess, WBC fluctuations IgM gammopathy cold agglutinin anemia and in coordination off her care.  Dr. Johnnye Sima is back tomorrow.   LOS: 8 days   Alcide Evener 06/27/2016, 3:15 PM

## 2016-06-27 NOTE — Progress Notes (Signed)
Referring Physician(s): Dr [name]  Supervising Physician: Sandi Mariscal  Patient Status:  Inpatient  Chief Complaint:  Hepatic abscess  Subjective:  Complains of some pain at area when flushing I have flushed 2 x with 5 cc each time Aspirated back some bloody and few clots Not painful if flush slow and limited to 5 cc  Allergies: Other; Compazine; Erythromycin; Lyrica; Promethazine; Tramadol; and Vistaril  Medications: Prior to Admission medications   Medication Sig Start Date End Date Taking? Authorizing Provider  acetaminophen (TYLENOL) 500 MG tablet Take 1,000 mg by mouth every 6 (six) hours as needed for mild pain, moderate pain, fever or headache.   Yes Historical Provider, MD  conjugated estrogens (PREMARIN) vaginal cream Place 1 Applicatorful vaginally 3 (three) times a week.   Yes Historical Provider, MD  diphenhydrAMINE (BENADRYL) 25 MG tablet Take 25 mg by mouth at bedtime as needed for sleep.    Yes Historical Provider, MD  famotidine (PEPCID) 10 MG tablet Take 10 mg by mouth daily as needed for heartburn or indigestion.   Yes Historical Provider, MD  ibuprofen (ADVIL,MOTRIN) 200 MG tablet Take 400 mg by mouth every 6 (six) hours as needed for fever, headache, mild pain, moderate pain or cramping.   Yes Historical Provider, MD  Melatonin 5 MG TABS Take 5 mg by mouth at bedtime as needed (sleep).   Yes Historical Provider, MD  metoprolol succinate (TOPROL-XL) 25 MG 24 hr tablet Take 1 tablet (25 mg total) by mouth daily. 12/13/13  Yes Robbie Lis, MD  Multiple Vitamin (MULTIVITAMIN WITH MINERALS) TABS tablet Take 1 tablet by mouth daily.   Yes Historical Provider, MD  ondansetron (ZOFRAN) 8 MG tablet Take 8 mg by mouth every 8 (eight) hours as needed for nausea or vomiting.   Yes Historical Provider, MD  Polyethyl Glycol-Propyl Glycol (SYSTANE OP) Apply 2 drops to eye 3 (three) times daily as needed (dry eyes).   Yes Historical Provider, MD  ibuprofen  (ADVIL,MOTRIN) 800 MG tablet Take 0.5 tablets (400 mg total) by mouth 3 (three) times daily. Patient not taking: Reported on 06/19/2016 06/16/16   [name], MD     Vital Signs: BP 126/42 mmHg  Pulse 88  Temp(Src) 98.6 F (37 C) (Oral)  Resp 20  Ht 5' 3.5" (1.613 m)  Wt 141 lb 12.1 oz (64.3 kg)  BMI 24.71 kg/m2  SpO2 97%  Physical Exam  Skin: Skin is warm and dry. No erythema.  Skin site of drain is clean and dry Output bloody fluid Flush x 2 with 5 cc each Aspirated back bloody; few clots  Not painful to flush if slow and limit to 5 cc    Imaging: Ct Abdomen Pelvis W Contrast  06/25/2016  CLINICAL DATA:  82 year old female with hypertension. Jaundice. Loss of weight. Fevers. EXAM: CT ABDOMEN AND PELVIS WITH CONTRAST TECHNIQUE: Multidetector CT imaging of the abdomen and pelvis was performed using the standard protocol following bolus administration of intravenous contrast. CONTRAST:  164mL ISOVUE-300 IOPAMIDOL (ISOVUE-300) INJECTION 61% COMPARISON:  06/19/2016 FINDINGS: Lower chest: There are bilateral pleural effusions, right greater than left. These appear increased in volume when compared with previous exam. Hepatobiliary: Liver abscess ease are again identified. A percutaneous drainage catheter is in place. The larger of the 2 abscesses measures 4.8 cm, image number 48 of series 2. Previously 4.7 cm. The more lateral abscess measures 3.7 cm, image 34 of series 2. Previously 4.2 cm. No new fluid collections identified. Pancreas: There are  a few scattered low-density lesions within the pancreas which are not changed from previous exam. The largest is in the distal tail measuring 7 mm, image 43 of series 2. Spleen: The spleen appears normal. Adrenals/Urinary Tract: Indeterminate, intermediate attenuating lesion in the mid left kidney measures 1.7 cm and 39 HU, image 45 of series 2. Unchanged from previous study. The bladder is normal. Stomach/Bowel: The stomach is normal. There is no  pathologic dilatation of the small bowel loops. Moderate stool burden is identified throughout the colon. No pathologic dilatation of the large bowel loops identified. Vascular/Lymphatic: Calcified atherosclerotic disease involves the abdominal aorta. No aneurysm. No enlarged retroperitoneal or mesenteric adenopathy. No enlarged pelvic or inguinal lymph nodes. Reproductive: The uterus and adnexal structures are unremarkable. Other: There is a small amount of ascites noted within the dependent portion of the pelvis. The diffuse body wall edema is identified. A small amount of perihepatic ascites noted. Musculoskeletal: No aggressive lytic or sclerotic bone lesions. The bones appear osteopenic. There is a stable compression fractures involve T11 and L1. Degenerative disc disease is noted at L4-5. IMPRESSION: 1. Two liver abscesses are again identified within the right lobe. Overall there has been no significant change in size of these complex fluid collections. The percutaneous drainage catheter remains in appropriate position. 2. Similar appearance of indeterminate low-attenuation lesions within the spleen an intermediate attenuating foci within the left kidney. Although favored to represent benign foci these will require nonemergent follow-up imaging with contrast enhanced MRI of the upper abdomen per for bili performed following resolution of the patient's current clinical condition on an outpatient basis. 3. Aortic atherosclerosis 4. Increase in pleural effusions and body wall edema. Electronically Signed   By: Kerby Moors M.D.   On: 06/25/2016 18:25   Dg Chest Port 1 View  06/24/2016  CLINICAL DATA:  Shortness of breath today. EXAM: PORTABLE CHEST 1 VIEW COMPARISON:  06/19/2016. FINDINGS: 0950 hours. Patient is rotated to the right. Low volume film with bibasilar atelectasis or infiltrate and small bilateral pleural effusions. The cardio pericardial silhouette is enlarged. Bones are diffusely demineralized.  Telemetry leads overlie the chest. Pigtail catheter over the right upper quadrant is compatible with recent hepatic abscess drainage. IMPRESSION: Rotated film with vascular congestion and bibasilar atelectasis or infiltrate. Small bilateral pleural effusions. Electronically Signed   By: Misty Stanley M.D.   On: 06/24/2016 10:10    Labs:  CBC:  Recent Labs  06/25/16 1700 06/26/16 0335 06/26/16 1648 06/27/16 0325  WBC 52.1* 48.3* 47.4* 38.1*  HGB 8.3* 7.8* 7.6* 6.7*  HCT 23.1* 22.1* 21.6* 19.3*  PLT 645* 648* 711* 680*    COAGS:  Recent Labs  06/20/16 0231 06/20/16 1330  INR 1.31 1.22    BMP:  Recent Labs  06/24/16 0342 06/25/16 0510 06/26/16 0335 06/27/16 0325  NA 127* 128* 129* 129*  K 4.7 3.7 3.2* 3.9  CL 99* 96* 94* 93*  CO2 22 25 27 30   GLUCOSE 119* 107* 108* 107*  BUN 19 14 14 14   CALCIUM 7.4* 7.7* 7.7* 7.7*  CREATININE 0.41* 0.43* 0.42* 0.54  GFRNONAA >60 >60 >60 >60  GFRAA >60 >60 >60 >60    LIVER FUNCTION TESTS:  Recent Labs  06/24/16 0342 06/25/16 0510 06/26/16 0335 06/27/16 0325  BILITOT 5.6* 5.5* 5.1* 3.8*  AST 49* 51* 38 35  ALT 89* 87* 76* 63*  ALKPHOS 238* 258* 231* 210*  PROT 5.2* 4.9* 5.2* 4.9*  ALBUMIN 2.1* 2.1* 2.1* 2.1*    Assessment  and Plan:  Hepatic abscess follow  Electronically Signed: Savreen Gebhardt A 06/27/2016, 11:57 AM   I spent a total of 15 Minutes at the the patient's bedside AND on the patient's hospital floor or unit, greater than 50% of which was counseling/coordinating care for hepatic abscess

## 2016-06-27 NOTE — Progress Notes (Signed)
Hematology Oncology Inpatient Progress Note  Date of service: 06/27/2016   Subjective:   Patient sitting up in the chair. Notes some increased fatigue. Receiving 2 units of prewarmed PRBC transfusion today. No other acute new symptoms. Abdominal pain better. Notes that she is forcing herself to eat but doesn't have much of an appetite. She is thankful for all her cares. Discussed current status with her third daughter that was not present yesterday but who is a [employer] social research officer.  Objective: Vital signs in last 24 hours: Blood pressure 127/55, pulse 95, temperature 98.9 F (37.2 C), temperature source Oral, resp. rate 25, height 5' 3.5" (1.613 m), weight 141 lb 12.1 oz (64.3 kg), SpO2 94 %.  Intake/Output from previous day: 07/15 0701 - 07/16 0700 In: 125 [P.O.:120] Out: 78 [Urine:650; Drains:20]  Physical Exam:  MMM Anicteric sclera CVS s1s2 RRR RS- CTA b/l Abdomen: No hepatomegaly, right upper quadrant drain with a gauze dressing, tender in the right subcostal region, no mass, the abdomen is mildly distended Vascular: Pitting edema at the arms and lower legs/feet   Lab Results:  . CBC Latest Ref Rng 06/27/2016 06/26/2016 06/26/2016  WBC 4.0 - 10.5 K/uL 38.1(H) 47.4(H) 48.3(H)  Hemoglobin 12.0 - 15.0 g/dL 6.7(LL) 7.6(L) 7.8(L)  Hematocrit 36.0 - 46.0 % 19.3(L) 21.6(L) 22.1(L)  Platelets 150 - 400 K/uL 680(H) 711(H) 648(H)   . CBC    Component Value Date/Time   WBC 38.1* 06/27/2016 0325   RBC 2.15* 06/27/2016 0325   RBC 2.16* 06/23/2016 0326   HGB 6.7* 06/27/2016 0325   HCT 19.3* 06/27/2016 0325   PLT 680* 06/27/2016 0325   MCV 89.8 06/27/2016 0325   MCH 31.2 06/27/2016 0325   MCHC 34.7 06/27/2016 0325   RDW 15.0 06/27/2016 0325   LYMPHSABS 5.0* 06/27/2016 0325   MONOABS 2.7* 06/27/2016 0325   EOSABS 0.4 06/27/2016 0325   BASOSABS 0.0 06/27/2016 0325     . CMP Latest Ref Rng 06/27/2016 06/26/2016 06/25/2016  Glucose 65 - 99 mg/dL 107(H) 108(H) 107(H)  BUN 6 - 20  mg/dL _0 Creatinine 0.44 - 1.00 mg/dL 0.54 0.42(L) 0.43(L)  Sodium 135 - 145 mmol/L 129(L) 129(L) 128(L)  Potassium 3.5 - 5.1 mmol/L 3.9 3.2(L) 3.7  Chloride 101 - 111 mmol/L 93(L) 94(L) 96(L)  CO2 22 - 32 mmol/L _1 Calcium 8.9 - 10.3 mg/dL 7.7(L) 7.7(L) 7.7(L)  Total Protein 6.5 - 8.1 g/dL 4.9(L) 5.2(L) 4.9(L)  Total Bilirubin 0.3 - 1.2 mg/dL 3.8(H) 5.1(H) 5.5(H)  Alkaline Phos 38 - 126 U/L 210(H) 231(H) 258(H)  AST 15 - 41 U/L 35 38 51(H)  ALT 14 - 54 U/L 63(H) 76(H) 87(H)   Component     Latest Ref Rng 06/21/2016  IgG (Immunoglobin G), Serum     700 - 1600 mg/dL 307 (L)  IgA     64 - 422 mg/dL 59 (L)  IgM, Serum     26 - 217 mg/dL 1351 (H)  Total Protein ELP     6.0 - 8.5 g/dL 5.2 (L)  Albumin SerPl Elph-Mcnc     2.9 - 4.4 g/dL 2.4 (L)  Alpha 1     0.0 - 0.4 g/dL 0.5 (H)  Alpha2 Glob SerPl Elph-Mcnc     0.4 - 1.0 g/dL 0.5  B-Globulin SerPl Elph-Mcnc     0.7 - 1.3 g/dL 0.6 (L)  Gamma Glob SerPl Elph-Mcnc     0.4 - 1.8 g/dL 1.2  M Protein SerPl Elph-Mcnc  Not Observed g/dL 0.8 (H)  Globulin, Total     2.2 - 3.9 g/dL 2.8  Albumin/Glob SerPl     0.7 - 1.7 0.9  IFE 1      Comment  Please Note (HCV):      Comment  Kappa free light chain     3.3 - 19.4 mg/L 28.0 (H)  Lamda free light chains     5.7 - 26.3 mg/L 11.6  Kappa, lamda light chain ratio     0.26 - 1.65 2.41 (H)  C3 Complement     82 - 167 mg/dL 94  Complement C4, Body Fluid     14 - 44 mg/dL <2 (L)  Cold Agglutinin Titer     Neg <1:32 >1:4096   Comment  Multiple Myeloma Panel (SPEP&IFE w/QIG)      The value has a corrected status.      No reference range information available      Comments:           (NOTE)           Immunofixation shows IgM monoclonal protein with kappa light           chain           specificity.             . Current facility-administered medications:  .  0.9 %  sodium chloride infusion, , Intravenous, Once, Belkys A Regalado, MD .  0.9 %  sodium chloride  infusion, , Intravenous, Once, Lily Kocher, MD .  ALPRAZolam Duanne Moron) tablet 0.5 mg, 0.5 mg, Oral, QHS PRN, Donne Hazel, MD, 0.5 mg at 06/26/16 2154 .  antiseptic oral rinse (CPC / CETYLPYRIDINIUM CHLORIDE 0.05%) solution 7 mL, 7 mL, Mouth Rinse, BID, Belkys A Regalado, MD, 7 mL at 06/27/16 1000 .  benzonatate (TESSALON) capsule 100 mg, 100 mg, Oral, TID PRN, Belkys A Regalado, MD, 100 mg at 06/25/16 2245 .  conjugated estrogens (PREMARIN) vaginal cream 1 Applicatorful, 1 Applicatorful, Vaginal, Once per day on Mon Wed Fri, Elmarie Shiley, MD, 1 Applicatorful at 65/68/12 1102 .  diphenhydrAMINE (BENADRYL) capsule 25 mg, 25 mg, Oral, QHS PRN, Belkys A Regalado, MD, 25 mg at 06/21/16 2233 .  feeding supplement (ENSURE ENLIVE) (ENSURE ENLIVE) liquid 237 mL, 237 mL, Oral, TID BM, Saima Rizwan, MD, 237 mL at 06/27/16 1000 .  feeding supplement (PRO-STAT SUGAR FREE 64) liquid 30 mL, 30 mL, Oral, BID, Debbe Odea, MD, 30 mL at 06/24/16 2156 .  fentaNYL (SUBLIMAZE) injection 12.5 mcg, 12.5 mcg, Intravenous, Q4H PRN, Debbe Odea, MD, 12.5 mcg at 06/26/16 2155 .  fluconazole (DIFLUCAN) tablet 100 mg, 100 mg, Oral, Daily, Debbe Odea, MD, 100 mg at 06/27/16 1119 .  folic acid (FOLVITE) tablet 1 mg, 1 mg, Oral, Daily, Ladell Pier, MD, 1 mg at 06/27/16 1102 .  ibuprofen (ADVIL,MOTRIN) tablet 400 mg, 400 mg, Oral, Q6H PRN, Belkys A Regalado, MD .  metoprolol succinate (TOPROL-XL) 24 hr tablet 25 mg, 25 mg, Oral, Daily, Belkys A Regalado, MD, 25 mg at 06/27/16 1103 .  multivitamin with minerals tablet 1 tablet, 1 tablet, Oral, Daily, Belkys A Regalado, MD, 1 tablet at 06/27/16 1102 .  ondansetron (ZOFRAN) tablet 4 mg, 4 mg, Oral, Q6H PRN, 4 mg at 06/21/16 0914 **OR** ondansetron (ZOFRAN) injection 4 mg, 4 mg, Intravenous, Q6H PRN, Belkys A Regalado, MD, 4 mg at 06/20/16 2105 .  ondansetron (ZOFRAN) tablet 8 mg, 8 mg, Oral, Q8H PRN, Belkys A Regalado,  MD, 8 mg at 06/22/16 1835 .  piperacillin-tazobactam  (ZOSYN) IVPB 3.375 g, 3.375 g, Intravenous, Q8H, Carmin Muskrat, MD, 3.375 g at 06/27/16 8675 .  polyethylene glycol (MIRALAX / GLYCOLAX) packet 17 g, 17 g, Oral, BID, Amy S Esterwood, PA-C, 17 g at 06/26/16 1022 .  ranitidine (ZANTAC) 150 MG/10ML syrup 150 mg, 150 mg, Oral, BID, Debbe Odea, MD, 150 mg at 06/27/16 1104 .  saccharomyces boulardii (FLORASTOR) capsule 250 mg, 250 mg, Oral, BID, Donne Hazel, MD, 250 mg at 06/27/16 1102 .  simethicone (MYLICON) chewable tablet 80 mg, 80 mg, Oral, QID PRN, Donne Hazel, MD, 80 mg at 06/25/16 1303 .  simethicone (MYLICON) chewable tablet 80 mg, 80 mg, Oral, QID, Debbe Odea, MD, 80 mg at 06/27/16 1102 .  sodium chloride flush (NS) 0.9 % injection 3 mL, 3 mL, Intravenous, Q12H, Belkys A Regalado, MD, 3 mL at 06/26/16 2156 .  sodium chloride tablet 1 g, 1 g, Oral, BID WC, Debbe Odea, MD, 1 g at 06/27/16 1102   Assessment/Plan:   1. 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 High cold agglutinin titers Additional factors - sepsis, blood loss from lab draw/drain placement, abx Hemoglobin is lower at 6.7 today and the patient is receiving 2 units of PRBCs which will be prewarmed.  2. IgM Kappa monoclonal gammopathy - could be related to lymphoplasmacytic lymphoma/Waldenstrom's macroglobulinemia.  3. Hepatic Abscess/Sepsis/Elevated LFts  4. Hyponatremia, hyperkalemia - ?adrenal insufficiency , dehydration/ malnutrition and other medical issues -being managed by hospitalist team.  PLAN -Transfuse 2 units of prewarmed PRBCs today. - keep patient warm and use gloves -if hypothermia from sepsis -would recommend use of body warmer -aggressive treatment of infections (since this contribute to complement load) and worsen cold agglutinin related hemolysis. -transfuse prn to maintain Hgb >8 or if patient symptomatic. Please use pre-warmed PRBCs for transfusion to  reduce cold agglutinin related hemolysis -eventually depending on goals of care and overall health status might need Bone marrow biopsy and PET/CT to r/o lymphoproliferative process /LPL. -folic acid and b complex to support accelerated hematopoiesis. -other management per hospitalist and ID teams.  Dr. Learta Codding will continue to follow the patient from tomorrow.  Sullivan Lone, MD   06/27/2016, 3:12 PM

## 2016-06-27 NOTE — Progress Notes (Signed)
CRITICAL VALUE ALERT  Critical value received: hgb 6.7  Date of notification:  06/27/16  Time of notification:  0508  Critical value read back:Yes.    Nurse who received alert:  Jacklynn Ganong  MD notified (1st page):[place]   Time of first page:  0514  MD notified (2nd page):[place]  Time of second page:0539  Responding MD: [place]  Time MD responded:  [phone]

## 2016-06-28 LAB — COMPREHENSIVE METABOLIC PANEL
ALT: 60 U/L — ABNORMAL HIGH (ref 14–54)
AST: 44 U/L — ABNORMAL HIGH (ref 15–41)
Albumin: 2.1 g/dL — ABNORMAL LOW (ref 3.5–5.0)
Alkaline Phosphatase: 202 U/L — ABNORMAL HIGH (ref 38–126)
Anion gap: 8 (ref 5–15)
BILIRUBIN TOTAL: 3.9 mg/dL — AB (ref 0.3–1.2)
BUN: 14 mg/dL (ref 6–20)
CHLORIDE: 94 mmol/L — AB (ref 101–111)
CO2: 28 mmol/L (ref 22–32)
CREATININE: 0.49 mg/dL (ref 0.44–1.00)
Calcium: 7.9 mg/dL — ABNORMAL LOW (ref 8.9–10.3)
Glucose, Bld: 102 mg/dL — ABNORMAL HIGH (ref 65–99)
POTASSIUM: 4.3 mmol/L (ref 3.5–5.1)
Sodium: 130 mmol/L — ABNORMAL LOW (ref 135–145)
TOTAL PROTEIN: 4.7 g/dL — AB (ref 6.5–8.1)

## 2016-06-28 LAB — TYPE AND SCREEN
ABO/RH(D): A POS
ANTIBODY SCREEN: POSITIVE
DAT, IGG: NEGATIVE
Unit division: 0
Unit division: 0

## 2016-06-28 LAB — GLUCOSE, CAPILLARY: Glucose-Capillary: 145 mg/dL — ABNORMAL HIGH (ref 65–99)

## 2016-06-28 LAB — CBC
HEMATOCRIT: 21.5 % — AB (ref 36.0–46.0)
Hemoglobin: 7.9 g/dL — ABNORMAL LOW (ref 12.0–15.0)
MCH: 33.5 pg (ref 26.0–34.0)
MCHC: 36.7 g/dL — ABNORMAL HIGH (ref 30.0–36.0)
MCV: 91.1 fL (ref 78.0–100.0)
PLATELETS: 651 10*3/uL — AB (ref 150–400)
RBC: 2.36 MIL/uL — AB (ref 3.87–5.11)
RDW: 15.9 % — AB (ref 11.5–15.5)
WBC: 40.7 10*3/uL — AB (ref 4.0–10.5)

## 2016-06-28 MED ORDER — DIPHENOXYLATE-ATROPINE 2.5-0.025 MG/5ML PO LIQD
5.0000 mL | Freq: Once | ORAL | Status: AC
  Administered 2016-06-28: 5 mL via ORAL

## 2016-06-28 MED ORDER — DICLOFENAC SODIUM 1 % TD GEL
2.0000 g | Freq: Four times a day (QID) | TRANSDERMAL | Status: DC
  Administered 2016-06-28 – 2016-07-04 (×18): 2 g via TOPICAL
  Filled 2016-06-28 (×2): qty 100

## 2016-06-28 NOTE — Progress Notes (Addendum)
[facility] Progress Note   Brianna [name]  TTS:[id number]  DOB: [birth year]  DOA: 06/19/2016 PCP: Tawanna Solo, MD  Brief narrative: Brianna [name] is a 82 y.o. female with HTN, cold agglutinin disease presenting with temp of 103, jaundice and dark urine and weight loss of about 10 lbs. She had been feeling bad since 7/5 and was seen in the ER for syncope and discharged home. She returned to there ER after she was noted to be jaundiced. She was found to have elevated LFTS, WBC count of 40 and a CT scan showing right lobe hepatic abscesses. She also had a sodium of 125 and potassium of 5.7. Tylenol level was normal.    Subjective: Right mid back is sore and she was not able to sleep because of this. Ankles swollen (not a new issue). She had some pain last night but none today. Appetite still poor. RN reports that patient has had frequent soft (not loose) small BMs for the past few days after receiving Miralax. Patient is not requesting something to slow down stools. Patient is also having trouble initiating urinary stream.   Assessment/Plan: Principal Problem:    Hepatic abscess causing sepsis Sepsis   Acute liver failure with Elevated LFTs & jaundice -   Zosyn- started 7/8 - drain placed on 7/10 per IR - all cultures negative  - ID consulted- antibiotics per ID - repeat CT on 7/14 not showing any change in abscesses - although leukocytosis not significantly improved, LFTs improving daily  Hepatic Function 06/28/2016 06/27/2016 06/26/2016  Total Protein 4.7(L) 4.9(L) 5.2(L)  Albumin 2.1(L) 2.1(L) 2.1(L)  AST 44(H) 35 38  ALT 60(H) 63(H) 76(H)  Alk Phosphatase 202(H) 210(H) 231(H)  Total Bilirubin 3.9(H) 3.8(H) 5.1(H)       Active Problems:  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 - Hb dropped from 9 to 5.6 on 7/9- transfused 3 U PRBC - Hb again dropped to 6.7 on 7/12-   no signs of bleeding- transfused 2 U PRBC - Hb 6.7 on 7/16 - transfuse 2 more U  PRBC - cold agglutinin titer > 1: 4096 - oddly LDH and haptoglobin earlier in on the admission were not consistent with hemolysis  - oncology assisting with management- questioning underlying proliferative disorder  Leukocytosis - mostly neutrophils- suspecting possibly underlying proliferative disorder in addition to current infection - need BM biopsy once improved  Frequent stool  small dose of Lomotil once  Difficulty with urination - check post void residual  Right mid back pain - Voltaren gel  Hyponatremia/ pedal edema - U sodium < 10   -Due to third spacing of fluid which is mostly in her legs- have been giving PRN Lasix- Cr stable - as of today I and O are equal - cont follow I and O- careful use of Lasix  - elevate legs - started sodium tabs to help bring up sodium - TEDS  Hypoalbuminemia/ severe protein calorie malnutrition/ anasarca  - due to abscesses and poor appetite - Boost was causing heartburn - changed to Ensure - added Prostat  Hypoxia on exertion - - pulm edema due to third spacing and fluid overload - atelectasis due to being bed bound - started Lasix- has improved - IS  Abdominal pain -  gas like   - Gas x      Benign essential HTN   - Metoprolol    Hyperkalemia - cause? - resolved    Antibiotics: Anti-infectives    Start  Dose/Rate Route Frequency Ordered Stop   06/27/16 2300  piperacillin-tazobactam (ZOSYN) IVPB 3.375 g     3.375 g 12.5 mL/hr over 240 Minutes Intravenous Every 8 hours 06/27/16 1615     06/27/16 1700  piperacillin-tazobactam (ZOSYN) IVPB 3.375 g     3.375 g 100 mL/hr over 30 Minutes Intravenous  Once 06/27/16 1615 06/27/16 1746   06/27/16 1200  fluconazole (DIFLUCAN) tablet 100 mg     100 mg Oral Daily 06/27/16 1111     06/25/16 0930  fluconazole (DIFLUCAN) IVPB 100 mg  Status:  Discontinued     100 mg 50 mL/hr over 60 Minutes Intravenous Every 24 hours 06/25/16 0841 06/27/16 1110   06/20/16 1400  vancomycin  (VANCOCIN) 500 mg in sodium chloride 0.9 % 100 mL IVPB  Status:  Discontinued     500 mg 100 mL/hr over 60 Minutes Intravenous Every 24 hours 06/19/16 1254 06/23/16 1550   06/19/16 2200  piperacillin-tazobactam (ZOSYN) IVPB 3.375 g  Status:  Discontinued     3.375 g 12.5 mL/hr over 240 Minutes Intravenous Every 8 hours 06/19/16 1254 06/27/16 1615   06/19/16 1245  piperacillin-tazobactam (ZOSYN) IVPB 3.375 g     3.375 g 100 mL/hr over 30 Minutes Intravenous  Once 06/19/16 1242 06/19/16 1335   06/19/16 1245  vancomycin (VANCOCIN) IVPB 1000 mg/200 mL premix     1,000 mg 200 mL/hr over 60 Minutes Intravenous  Once 06/19/16 1242 06/19/16 1456     Code Status:     Code Status Orders        Start     Ordered   06/19/16 1721  Do not attempt resuscitation (DNR)   Continuous    Question Answer Comment  In the event of cardiac or respiratory ARREST Do not call a "code blue"   In the event of cardiac or respiratory ARREST Do not perform Intubation, CPR, defibrillation or ACLS   In the event of cardiac or respiratory ARREST Use medication by any route, position, wound care, and other measures to relive pain and suffering. May use oxygen, suction and manual treatment of airway obstruction as needed for comfort.      06/19/16 1720    Code Status History    Date Active Date Inactive Code Status Order ID Comments User Context   12/10/2013  5:39 AM 12/13/2013  2:12 PM Full Code [id number]  Etta Quill, DO ED     Family Communication: daughters Disposition Plan: to be determined- cont to follow in hospital until ID and hematology decide when she would be safe to discharge DVT prophylaxis: SCDS Consultants: hematology, GI, ID Procedures: RUQ drain    Objective: Filed Weights   06/26/16 2325 06/27/16 0500 06/28/16 0500  Weight: 65.9 kg (145 lb 4.5 oz) 64.3 kg (141 lb 12.1 oz) 63.5 kg (139 lb 15.9 oz)    Intake/Output Summary (Last 24 hours) at 06/28/16 1249 Last data filed at 06/28/16  0800  Gross per 24 hour  Intake   1075 ml  Output   2797 ml  Net  -1722 ml     Vitals Filed Vitals:   06/28/16 0600 06/28/16 0800 06/28/16 1139 06/28/16 1200  BP: 130/85  138/51 157/59  Pulse: 92  86 99  Temp:  98.2 F (36.8 C)  97.7 F (36.5 C)  TempSrc:  Oral  Oral  Resp: 17  22 24   Height:      Weight:      SpO2: 93%  96% 94%  Exam:  General:  Pt is alert, not in acute distress  HEENT: icterus improving, No thrush, oral mucosa moist  Cardiovascular: regular rate and rhythm, S1/S2 No murmur  Respiratory: clear to auscultation bilaterally   Abdomen: Soft, +Bowel sounds, distended, no guarding- drain in place with bloody fluid  MSK: No cyanosis or clubbing- + piting pedal edema   Data Reviewed: Basic Metabolic Panel:  Recent Labs Lab 06/24/16 0342 06/25/16 0510 06/26/16 0335 06/27/16 0325 06/28/16 0311  NA 127* 128* 129* 129* 130*  K 4.7 3.7 3.2* 3.9 4.3  CL 99* 96* 94* 93* 94*  CO2 22 25 27 30 28   GLUCOSE 119* 107* 108* 107* 102*  BUN 19 14 14 14 14   CREATININE 0.41* 0.43* 0.42* 0.54 0.49  CALCIUM 7.4* 7.7* 7.7* 7.7* 7.9*   Liver Function Tests:  Recent Labs Lab 06/24/16 0342 06/25/16 0510 06/26/16 0335 06/27/16 0325 06/28/16 0311  AST 49* 51* 38 35 44*  ALT 89* 87* 76* 63* 60*  ALKPHOS 238* 258* 231* 210* 202*  BILITOT 5.6* 5.5* 5.1* 3.8* 3.9*  PROT 5.2* 4.9* 5.2* 4.9* 4.7*  ALBUMIN 2.1* 2.1* 2.1* 2.1* 2.1*   No results for input(s): LIPASE, AMYLASE in the last 168 hours. No results for input(s): AMMONIA in the last 168 hours. CBC:  Recent Labs Lab 06/25/16 1700 06/26/16 0335 06/26/16 1648 06/27/16 0325 06/28/16 0311  WBC 52.1* 48.3* 47.4* 38.1* 40.7*  NEUTROABS  --   --   --  30.0*  --   HGB 8.3* 7.8* 7.6* 6.7* 7.9*  HCT 23.1* 22.1* 21.6* 19.3* 21.5*  MCV 89.5 88.4 90.8 89.8 91.1  PLT 645* 648* 711* 680* 651*   Cardiac Enzymes: No results for input(s): CKTOTAL, CKMB, CKMBINDEX, TROPONINI in the last 168 hours. BNP (last  3 results) No results for input(s): BNP in the last 8760 hours.  ProBNP (last 3 results) No results for input(s): PROBNP in the last 8760 hours.  CBG:  Recent Labs Lab 06/28/16 1201  GLUCAP 145*    Recent Results (from the past 240 hour(s))  Culture, blood (Routine X 2)     Status: None   Collection Time: 06/19/16 12:56 PM  Result Value Ref Range Status   Specimen Description BLOOD RIGHT ARM  Final   Special Requests BOTTLES DRAWN AEROBIC AND ANAEROBIC 5CC  Final   Culture   Final    NO GROWTH 5 DAYS Performed at [facility]    Report Status 06/24/2016 FINAL  Final  Culture, blood (Routine X 2)     Status: None   Collection Time: 06/19/16 12:56 PM  Result Value Ref Range Status   Specimen Description BLOOD LEFT HAND  Final   Special Requests BOTTLES DRAWN AEROBIC AND ANAEROBIC 5CC  Final   Culture   Final    NO GROWTH 5 DAYS Performed at [facility]    Report Status 06/24/2016 FINAL  Final  Urine culture     Status: Abnormal   Collection Time: 06/19/16  1:31 PM  Result Value Ref Range Status   Specimen Description URINE, CLEAN CATCH  Final   Special Requests NONE  Final   Culture (A)  Final    <10,000 COLONIES/mL INSIGNIFICANT GROWTH Performed at [facility]    Report Status 06/20/2016 FINAL  Final  MRSA PCR Screening     Status: None   Collection Time: 06/19/16  5:18 PM  Result Value Ref Range Status   MRSA by PCR NEGATIVE NEGATIVE Final  Comment:        The GeneXpert MRSA Assay (FDA approved for NASAL specimens only), is one component of a comprehensive MRSA colonization surveillance program. It is not intended to diagnose MRSA infection nor to guide or monitor treatment for MRSA infections.   Aerobic/Anaerobic Culture (surgical/deep wound)     Status: None (Preliminary result)   Collection Time: 06/21/16  4:40 PM  Result Value Ref Range Status   Specimen Description LIVER ABSCESS  Final   Special Requests NONE  Final   Gram  Stain   Final    ABUNDANT WBC PRESENT, PREDOMINANTLY PMN NO ORGANISMS SEEN    Culture   Final    HOLDING FOR POSSIBLE ANAEROBE Performed at [facility]    Report Status PENDING  Incomplete     Studies: No results found.  Scheduled Meds:  Scheduled Meds: . sodium chloride   Intravenous Once  . sodium chloride   Intravenous Once  . antiseptic oral rinse  7 mL Mouth Rinse BID  . conjugated estrogens  1 Applicatorful Vaginal Once per day on Mon Wed Fri  . diclofenac sodium  2 g Topical QID  . diphenoxylate-atropine  5 mL Oral Once  . feeding supplement (ENSURE ENLIVE)  237 mL Oral TID BM  . feeding supplement (PRO-STAT SUGAR FREE 64)  30 mL Oral BID  . fluconazole  100 mg Oral Daily  . folic acid  1 mg Oral Daily  . metoprolol succinate  25 mg Oral Daily  . multivitamin with minerals  1 tablet Oral Daily  . piperacillin-tazobactam (ZOSYN)  IV  3.375 g Intravenous Q8H  . polyethylene glycol  17 g Oral BID  . ranitidine  150 mg Oral BID  . simethicone  80 mg Oral QID  . sodium chloride flush  3 mL Intravenous Q12H  . sodium chloride  1 g Oral BID WC   Continuous Infusions:    Time spent on care of this patient: 35 min   Ravine, MD 06/28/2016, 12:49 PM  LOS: 9 days   [facility] Office  [phone] Pager - Text Page per [email or web address] If 7PM-7AM, please contact night-coverage [email or web address]

## 2016-06-28 NOTE — Progress Notes (Signed)
INFECTIOUS DISEASE PROGRESS NOTE  ID: Brianna [name] is a 82 y.o. female with  Principal Problem:   Hepatic abscess Active Problems:   Spinal stenosis of lumbar region   Benign essential HTN   Sepsis (HCC)   Hyponatremia   Hyperkalemia   Jaundice   Elevated LFTs   Malnutrition of moderate degree   Hypoxia   Liver abscess   Hemolytic anemia (HCC)   Cold agglutinin disease (HCC)   IgM monoclonal gammopathy of uncertain significance   Leukocytosis  Subjective: C/o dysuria, straining C/o formed but soft BM.  Sharp back pain.  Feels better now that she is up in chair.   Abtx:  Anti-infectives    Start     Dose/Rate Route Frequency Ordered Stop   06/27/16 2300  piperacillin-tazobactam (ZOSYN) IVPB 3.375 g     3.375 g 12.5 mL/hr over 240 Minutes Intravenous Every 8 hours 06/27/16 1615     06/27/16 1700  piperacillin-tazobactam (ZOSYN) IVPB 3.375 g     3.375 g 100 mL/hr over 30 Minutes Intravenous  Once 06/27/16 1615 06/27/16 1746   06/27/16 1200  fluconazole (DIFLUCAN) tablet 100 mg     100 mg Oral Daily 06/27/16 1111     06/25/16 0930  fluconazole (DIFLUCAN) IVPB 100 mg  Status:  Discontinued     100 mg 50 mL/hr over 60 Minutes Intravenous Every 24 hours 06/25/16 0841 06/27/16 1110   06/20/16 1400  vancomycin (VANCOCIN) 500 mg in sodium chloride 0.9 % 100 mL IVPB  Status:  Discontinued     500 mg 100 mL/hr over 60 Minutes Intravenous Every 24 hours 06/19/16 1254 06/23/16 1550   06/19/16 2200  piperacillin-tazobactam (ZOSYN) IVPB 3.375 g  Status:  Discontinued     3.375 g 12.5 mL/hr over 240 Minutes Intravenous Every 8 hours 06/19/16 1254 06/27/16 1615   06/19/16 1245  piperacillin-tazobactam (ZOSYN) IVPB 3.375 g     3.375 g 100 mL/hr over 30 Minutes Intravenous  Once 06/19/16 1242 06/19/16 1335   06/19/16 1245  vancomycin (VANCOCIN) IVPB 1000 mg/200 mL premix     1,000 mg 200 mL/hr over 60 Minutes Intravenous  Once 06/19/16 1242 06/19/16 1456      Medications:    Scheduled: . antiseptic oral rinse  7 mL Mouth Rinse BID  . conjugated estrogens  1 Applicatorful Vaginal Once per day on Mon Wed Fri  . diclofenac sodium  2 g Topical QID  . feeding supplement (ENSURE ENLIVE)  237 mL Oral TID BM  . feeding supplement (PRO-STAT SUGAR FREE 64)  30 mL Oral BID  . fluconazole  100 mg Oral Daily  . folic acid  1 mg Oral Daily  . metoprolol succinate  25 mg Oral Daily  . multivitamin with minerals  1 tablet Oral Daily  . piperacillin-tazobactam (ZOSYN)  IV  3.375 g Intravenous Q8H  . polyethylene glycol  17 g Oral BID  . ranitidine  150 mg Oral BID  . simethicone  80 mg Oral QID  . sodium chloride flush  3 mL Intravenous Q12H  . sodium chloride  1 g Oral BID WC    Objective: Vital signs in last 24 hours: Temp:  [97.7 F (36.5 C)-99.3 F (37.4 C)] 97.7 F (36.5 C) (07/17 1200) Pulse Rate:  [84-109] 99 (07/17 1200) Resp:  [17-27] 24 (07/17 1200) BP: (129-172)/(49-116) 157/59 mmHg (07/17 1200) SpO2:  [92 %-98 %] 94 % (07/17 1200) Weight:  [63.5 kg (139 lb 15.9 oz)] 63.5 kg (139 lb 15.9  oz) (07/17 0500)   General appearance: alert, cooperative and no distress Resp: clear to auscultation bilaterally Cardio: regular rate and rhythm GI: normal findings: bowel sounds normal and soft, non-tender Skin: no rash on her back.   Lab Results  Recent Labs  06/27/16 0325 06/28/16 0311  WBC 38.1* 40.7*  HGB 6.7* 7.9*  HCT 19.3* 21.5*  NA 129* 130*  K 3.9 4.3  CL 93* 94*  CO2 30 28  BUN 14 14  CREATININE 0.54 0.49   Liver Panel  Recent Labs  06/27/16 0325 06/28/16 0311  PROT 4.9* 4.7*  ALBUMIN 2.1* 2.1*  AST 35 44*  ALT 63* 60*  ALKPHOS 210* 202*  BILITOT 3.8* 3.9*   Sedimentation Rate No results for input(s): ESRSEDRATE in the last 72 hours. C-Reactive Protein No results for input(s): CRP in the last 72 hours.  Microbiology: Recent Results (from the past 240 hour(s))  Culture, blood (Routine X 2)     Status: None   Collection  Time: 06/19/16 12:56 PM  Result Value Ref Range Status   Specimen Description BLOOD RIGHT ARM  Final   Special Requests BOTTLES DRAWN AEROBIC AND ANAEROBIC 5CC  Final   Culture   Final    NO GROWTH 5 DAYS Performed at [facility]    Report Status 06/24/2016 FINAL  Final  Culture, blood (Routine X 2)     Status: None   Collection Time: 06/19/16 12:56 PM  Result Value Ref Range Status   Specimen Description BLOOD LEFT HAND  Final   Special Requests BOTTLES DRAWN AEROBIC AND ANAEROBIC 5CC  Final   Culture   Final    NO GROWTH 5 DAYS Performed at [facility]    Report Status 06/24/2016 FINAL  Final  Urine culture     Status: Abnormal   Collection Time: 06/19/16  1:31 PM  Result Value Ref Range Status   Specimen Description URINE, CLEAN CATCH  Final   Special Requests NONE  Final   Culture (A)  Final    <10,000 COLONIES/mL INSIGNIFICANT GROWTH Performed at [facility]    Report Status 06/20/2016 FINAL  Final  MRSA PCR Screening     Status: None   Collection Time: 06/19/16  5:18 PM  Result Value Ref Range Status   MRSA by PCR NEGATIVE NEGATIVE Final    Comment:        The GeneXpert MRSA Assay (FDA approved for NASAL specimens only), is one component of a comprehensive MRSA colonization surveillance program. It is not intended to diagnose MRSA infection nor to guide or monitor treatment for MRSA infections.   Aerobic/Anaerobic Culture (surgical/deep wound)     Status: None (Preliminary result)   Collection Time: 06/21/16  4:40 PM  Result Value Ref Range Status   Specimen Description LIVER ABSCESS  Final   Special Requests NONE  Final   Gram Stain   Final    ABUNDANT WBC PRESENT, PREDOMINANTLY PMN NO ORGANISMS SEEN    Culture   Final    HOLDING FOR POSSIBLE ANAEROBE Performed at [facility]    Report Status PENDING  Incomplete    Studies/Results: No results found.   Assessment/Plan: Liver Abscess Await her repeat CT scan is  unimproved. Her WBC remains elevated.  Defer to IR regarding need to change drain, reposition.  No change in zosyn for now.  Cx with possible anaerobe  Diverticuliltis? Pt relates LLQ pain 2 weeks pta  Urinary hesitancy  Fluid overload  Cold  Agglutinin anemia apprciate heme f/u Consideration for other causes of her leukpcytosis (aside from infection)  Protein Calorie Malnutrition Nutrition f/u  Total days of antibiotics: 8 zosyn         Bobby Rumpf Infectious Diseases (pager) [phone] [email or web address] 06/28/2016, 3:24 PM  LOS: 9 days

## 2016-06-28 NOTE — Progress Notes (Signed)
Pharmacy Antibiotic Note  Brianna [name] is a 82 y.o. female admitted on 06/19/2016 with c/o fever and weakness.  Vancomycin and zosyn were started on admission for broad coverage for suspected sepsis. She was also found to have elevated LFTs and now s/p perc drainage of liver abscess on 7/10.  Today, 06/28/2016: - s/p perc drainage of liver abscess on 7/10 - afeb, wbc persistently elevated, scr stable 0.43 (crcl~43, pt's 82 y.o) - ID recommends to cont with current abx regimen for now - Repeat CT scan 7/14 still + two liver abscesses of right lobe, unchanged.   Plan: - continue zosyn 3.375 gm IV q8h (infuse over 4 hours) -Duration of therapy per ID -No further dose adjustments anticipated.  Pharmacy to sign off. Please re-consult if needed.  _________________________________  Height: 5' 3.5" (161.3 cm) Weight: 139 lb 15.9 oz (63.5 kg) IBW/kg (Calculated) : 53.55  Temp (24hrs), Avg:98.8 F (37.1 C), Min:98.2 F (36.8 C), Max:99.3 F (37.4 C)   Recent Labs Lab 06/23/16 0326  06/24/16 0342 06/25/16 0510 06/25/16 1700 06/26/16 0335 06/26/16 1648 06/27/16 0325 06/28/16 0311  WBC  --   < > 49.7* 44.9* 52.1* 48.3* 47.4* 38.1* 40.7*  CREATININE 0.43*  --  0.41* 0.43*  --  0.42*  --  0.54 0.49  LATICACIDVEN 0.7  --   --   --   --   --   --   --   --   < > = values in this interval not displayed.  Estimated Creatinine Clearance: 39.5 mL/min (by C-G formula based on Cr of 0.49).    Allergies  Allergen Reactions  . Other Nausea And Vomiting    *All pain medications*  . Compazine [Prochlorperazine] Other (See Comments)    Restless, hallucinations  . Erythromycin     "passed out"  . Lyrica [Pregabalin] Other (See Comments)    Sleep walking  . Promethazine Other (See Comments)    Restless, hallucinations  . Tramadol Nausea And Vomiting  . Vistaril [Hydroxyzine Hcl] Other (See Comments)    Stated she had hallucinations.    Antimicrobials this admission: 7/8 vancomycin >>   7/12 7/8 zosyn >>  7/14 fluconazole (MD) >>  Dose adjustments this admission: N/a  Microbiology results: 7/8 BCx x2: NGF 7/8 UCx: insignif growth FINAL 7/8 MRSA PCR neg 7/10 liver abscesss: ngtd  Thank you for allowing pharmacy to be a part of this patient's care.  Netta Cedars, PharmD, BCPS Pager: [phone] 06/28/2016 11:30 AM

## 2016-06-28 NOTE — Progress Notes (Signed)
Pt admitted to the unit from ICU with daughter. Pt is stable, alert and oriented per baseline. Oriented to room, staff, and call bell. Educated to call for any assistance. Bed in lowest position, call bell within reach- will continue to monitor.

## 2016-06-28 NOTE — Progress Notes (Signed)
Patient ID: Brianna [name], female   DOB: 19-Jul-1924, 82 y.o.   MRN: [id number]    Referring Physician(s): Dr. [name]  Supervising Physician: Sandi Mariscal  Patient Status: inpt  Chief Complaint: Hepatic abscess  Subjective: Patient complaining of pain in her right back, to the touch, feels like shock type pains.  Allergies: Other; Compazine; Erythromycin; Lyrica; Promethazine; Tramadol; and Vistaril  Medications: Prior to Admission medications   Medication Sig Start Date End Date Taking? Authorizing Provider  acetaminophen (TYLENOL) 500 MG tablet Take 1,000 mg by mouth every 6 (six) hours as needed for mild pain, moderate pain, fever or headache.   Yes Historical Provider, MD  conjugated estrogens (PREMARIN) vaginal cream Place 1 Applicatorful vaginally 3 (three) times a week.   Yes Historical Provider, MD  diphenhydrAMINE (BENADRYL) 25 MG tablet Take 25 mg by mouth at bedtime as needed for sleep.    Yes Historical Provider, MD  famotidine (PEPCID) 10 MG tablet Take 10 mg by mouth daily as needed for heartburn or indigestion.   Yes Historical Provider, MD  ibuprofen (ADVIL,MOTRIN) 200 MG tablet Take 400 mg by mouth every 6 (six) hours as needed for fever, headache, mild pain, moderate pain or cramping.   Yes Historical Provider, MD  Melatonin 5 MG TABS Take 5 mg by mouth at bedtime as needed (sleep).   Yes Historical Provider, MD  metoprolol succinate (TOPROL-XL) 25 MG 24 hr tablet Take 1 tablet (25 mg total) by mouth daily. 12/13/13  Yes Robbie Lis, MD  Multiple Vitamin (MULTIVITAMIN WITH MINERALS) TABS tablet Take 1 tablet by mouth daily.   Yes Historical Provider, MD  ondansetron (ZOFRAN) 8 MG tablet Take 8 mg by mouth every 8 (eight) hours as needed for nausea or vomiting.   Yes Historical Provider, MD  Polyethyl Glycol-Propyl Glycol (SYSTANE OP) Apply 2 drops to eye 3 (three) times daily as needed (dry eyes).   Yes Historical Provider, MD  ibuprofen (ADVIL,MOTRIN) 800 MG  tablet Take 0.5 tablets (400 mg total) by mouth 3 (three) times daily. Patient not taking: Reported on 06/19/2016 06/16/16   [name], MD    Vital Signs: BP 138/51 mmHg  Pulse 86  Temp(Src) 98.2 F (36.8 C) (Oral)  Resp 22  Ht 5' 3.5" (1.613 m)  Wt 139 lb 15.9 oz (63.5 kg)  BMI 24.41 kg/m2  SpO2 96%  Physical Exam: Abd: soft, drain in place in RUQ.  Drain site is clean and dry.  Drain with minimal bloody output.  Drain with 20cc documented in last 24 hrs.    Imaging: Ct Abdomen Pelvis W Contrast  06/25/2016  CLINICAL DATA:  82 year old female with hypertension. Jaundice. Loss of weight. Fevers. EXAM: CT ABDOMEN AND PELVIS WITH CONTRAST TECHNIQUE: Multidetector CT imaging of the abdomen and pelvis was performed using the standard protocol following bolus administration of intravenous contrast. CONTRAST:  148mL ISOVUE-300 IOPAMIDOL (ISOVUE-300) INJECTION 61% COMPARISON:  06/19/2016 FINDINGS: Lower chest: There are bilateral pleural effusions, right greater than left. These appear increased in volume when compared with previous exam. Hepatobiliary: Liver abscess ease are again identified. A percutaneous drainage catheter is in place. The larger of the 2 abscesses measures 4.8 cm, image number 48 of series 2. Previously 4.7 cm. The more lateral abscess measures 3.7 cm, image 34 of series 2. Previously 4.2 cm. No new fluid collections identified. Pancreas: There are a few scattered low-density lesions within the pancreas which are not changed from previous exam. The largest is in the distal tail measuring  7 mm, image 43 of series 2. Spleen: The spleen appears normal. Adrenals/Urinary Tract: Indeterminate, intermediate attenuating lesion in the mid left kidney measures 1.7 cm and 39 HU, image 45 of series 2. Unchanged from previous study. The bladder is normal. Stomach/Bowel: The stomach is normal. There is no pathologic dilatation of the small bowel loops. Moderate stool burden is identified  throughout the colon. No pathologic dilatation of the large bowel loops identified. Vascular/Lymphatic: Calcified atherosclerotic disease involves the abdominal aorta. No aneurysm. No enlarged retroperitoneal or mesenteric adenopathy. No enlarged pelvic or inguinal lymph nodes. Reproductive: The uterus and adnexal structures are unremarkable. Other: There is a small amount of ascites noted within the dependent portion of the pelvis. The diffuse body wall edema is identified. A small amount of perihepatic ascites noted. Musculoskeletal: No aggressive lytic or sclerotic bone lesions. The bones appear osteopenic. There is a stable compression fractures involve T11 and L1. Degenerative disc disease is noted at L4-5. IMPRESSION: 1. Two liver abscesses are again identified within the right lobe. Overall there has been no significant change in size of these complex fluid collections. The percutaneous drainage catheter remains in appropriate position. 2. Similar appearance of indeterminate low-attenuation lesions within the spleen an intermediate attenuating foci within the left kidney. Although favored to represent benign foci these will require nonemergent follow-up imaging with contrast enhanced MRI of the upper abdomen per for bili performed following resolution of the patient's current clinical condition on an outpatient basis. 3. Aortic atherosclerosis 4. Increase in pleural effusions and body wall edema. Electronically Signed   By: Kerby Moors M.D.   On: 06/25/2016 18:25    Labs:  CBC:  Recent Labs  06/26/16 0335 06/26/16 1648 06/27/16 0325 06/28/16 0311  WBC 48.3* 47.4* 38.1* 40.7*  HGB 7.8* 7.6* 6.7* 7.9*  HCT 22.1* 21.6* 19.3* 21.5*  PLT 648* 711* 680* 651*    COAGS:  Recent Labs  06/20/16 0231 06/20/16 1330  INR 1.31 1.22    BMP:  Recent Labs  06/25/16 0510 06/26/16 0335 06/27/16 0325 06/28/16 0311  NA 128* 129* 129* 130*  K 3.7 3.2* 3.9 4.3  CL 96* 94* 93* 94*  CO2 25 27  30 28   GLUCOSE 107* 108* 107* 102*  BUN 14 14 14 14   CALCIUM 7.7* 7.7* 7.7* 7.9*  CREATININE 0.43* 0.42* 0.54 0.49  GFRNONAA >60 >60 >60 >60  GFRAA >60 >60 >60 >60    LIVER FUNCTION TESTS:  Recent Labs  06/25/16 0510 06/26/16 0335 06/27/16 0325 06/28/16 0311  BILITOT 5.5* 5.1* 3.8* 3.9*  AST 51* 38 35 44*  ALT 87* 76* 63* 60*  ALKPHOS 258* 231* 210* 202*  PROT 4.9* 5.2* 4.9* 4.7*  ALBUMIN 2.1* 2.1* 2.1* 2.1*    Assessment and Plan: 1. Hepatic abscess - S/p hepatic drain placement on 7/10 - [place] -cont with drain placement for now.  Only put out about 20cc of output in last 24 hrs.  Getting flushed with 5cc of NS q shift.  Just had CT scan 3 days ago with no significant change. -WBC remains elevated, oncology following -will continue to follow Electronically Signed: Belisa Eichholz E 06/28/2016, 11:52 AM   I spent a total of 15 Minutes at the the patient's bedside AND on the patient's hospital floor or unit, greater than 50% of which was counseling/coordinating care for hepatic abscess

## 2016-06-29 DIAGNOSIS — K769 Liver disease, unspecified: Secondary | ICD-10-CM

## 2016-06-29 DIAGNOSIS — D473 Essential (hemorrhagic) thrombocythemia: Secondary | ICD-10-CM

## 2016-06-29 LAB — COMPREHENSIVE METABOLIC PANEL
ALT: 46 U/L (ref 14–54)
ANION GAP: 5 (ref 5–15)
AST: 31 U/L (ref 15–41)
Albumin: 2.3 g/dL — ABNORMAL LOW (ref 3.5–5.0)
Alkaline Phosphatase: 185 U/L — ABNORMAL HIGH (ref 38–126)
BILIRUBIN TOTAL: 3.5 mg/dL — AB (ref 0.3–1.2)
BUN: 14 mg/dL (ref 6–20)
CHLORIDE: 97 mmol/L — AB (ref 101–111)
CO2: 29 mmol/L (ref 22–32)
Calcium: 7.8 mg/dL — ABNORMAL LOW (ref 8.9–10.3)
Creatinine, Ser: 0.57 mg/dL (ref 0.44–1.00)
Glucose, Bld: 95 mg/dL (ref 65–99)
POTASSIUM: 4.3 mmol/L (ref 3.5–5.1)
Sodium: 131 mmol/L — ABNORMAL LOW (ref 135–145)
TOTAL PROTEIN: 5.2 g/dL — AB (ref 6.5–8.1)

## 2016-06-29 LAB — CBC
HCT: 21.7 % — ABNORMAL LOW (ref 36.0–46.0)
HEMOGLOBIN: 7.8 g/dL — AB (ref 12.0–15.0)
MCH: 33.5 pg (ref 26.0–34.0)
MCHC: 35.9 g/dL (ref 30.0–36.0)
MCV: 93.1 fL (ref 78.0–100.0)
PLATELETS: 694 10*3/uL — AB (ref 150–400)
RBC: 2.33 MIL/uL — AB (ref 3.87–5.11)
RDW: 17.4 % — ABNORMAL HIGH (ref 11.5–15.5)
WBC: 34.2 10*3/uL — AB (ref 4.0–10.5)

## 2016-06-29 LAB — AEROBIC/ANAEROBIC CULTURE (SURGICAL/DEEP WOUND)

## 2016-06-29 LAB — AEROBIC/ANAEROBIC CULTURE W GRAM STAIN (SURGICAL/DEEP WOUND)

## 2016-06-29 MED ORDER — PRO-STAT SUGAR FREE PO LIQD: 30.0000 mL | Freq: Three times a day (TID) | ORAL | Status: DC

## 2016-06-29 MED ORDER — FUROSEMIDE 40 MG PO TABS
40.0000 mg | ORAL_TABLET | Freq: Once | ORAL | Status: AC
  Administered 2016-06-29: 40 mg via ORAL
  Filled 2016-06-29: qty 1

## 2016-06-29 MED ORDER — JUVEN PO PACK
2.0000 | PACK | Freq: Two times a day (BID) | ORAL | Status: DC
  Administered 2016-07-01 – 2016-07-03 (×2): 2 via ORAL
  Filled 2016-06-29 (×14): qty 2

## 2016-06-29 NOTE — Progress Notes (Signed)
INFECTIOUS DISEASE PROGRESS NOTE  ID: Brianna [name] is a 82 y.o. 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  "best day yet!" Abtx:  Anti-infectives    Start     Dose/Rate Route Frequency Ordered Stop   06/27/16 2300  piperacillin-tazobactam (ZOSYN) IVPB 3.375 g     3.375 g 12.5 mL/hr over 240 Minutes Intravenous Every 8 hours 06/27/16 1615     06/27/16 1700  piperacillin-tazobactam (ZOSYN) IVPB 3.375 g     3.375 g 100 mL/hr over 30 Minutes Intravenous  Once 06/27/16 1615 06/27/16 1746   06/27/16 1200  fluconazole (DIFLUCAN) tablet 100 mg     100 mg Oral Daily 06/27/16 1111     06/25/16 0930  fluconazole (DIFLUCAN) IVPB 100 mg  Status:  Discontinued     100 mg 50 mL/hr over 60 Minutes Intravenous Every 24 hours 06/25/16 0841 06/27/16 1110   06/20/16 1400  vancomycin (VANCOCIN) 500 mg in sodium chloride 0.9 % 100 mL IVPB  Status:  Discontinued     500 mg 100 mL/hr over 60 Minutes Intravenous Every 24 hours 06/19/16 1254 06/23/16 1550   06/19/16 2200  piperacillin-tazobactam (ZOSYN) IVPB 3.375 g  Status:  Discontinued     3.375 g 12.5 mL/hr over 240 Minutes Intravenous Every 8 hours 06/19/16 1254 06/27/16 1615   06/19/16 1245  piperacillin-tazobactam (ZOSYN) IVPB 3.375 g     3.375 g 100 mL/hr over 30 Minutes Intravenous  Once 06/19/16 1242 06/19/16 1335   06/19/16 1245  vancomycin (VANCOCIN) IVPB 1000 mg/200 mL premix     1,000 mg 200 mL/hr over 60 Minutes Intravenous   Once 06/19/16 1242 06/19/16 1456      Medications:  Scheduled: . antiseptic oral rinse  7 mL Mouth Rinse BID  . conjugated estrogens  1 Applicatorful Vaginal Once per day on Mon Wed Fri  . diclofenac sodium  2 g Topical QID  . feeding supplement (ENSURE ENLIVE)  237 mL Oral TID BM  . feeding supplement (PRO-STAT SUGAR FREE 64)  30 mL Oral TID  . fluconazole  100 mg Oral Daily  . folic acid  1 mg Oral Daily  . furosemide  40 mg Oral Once  . metoprolol succinate  25 mg Oral Daily  . multivitamin with minerals  1 tablet Oral Daily  . piperacillin-tazobactam (ZOSYN)  IV  3.375 g Intravenous Q8H  . polyethylene glycol  17 g Oral BID  . ranitidine  150 mg Oral BID  . simethicone  80 mg Oral QID  . sodium chloride flush  3 mL Intravenous Q12H  . sodium chloride  1 g Oral BID WC    Objective: Vital signs in last 24 hours: Temp:  [97.7 F (36.5 C)-99.4 F (37.4 C)] 98.1 F (36.7 C) (07/18 0628) Pulse Rate:  [83-103] 83 (07/18 0628) Resp:  [18-24] 18 (07/18 0628) BP: (130-157)/(51-86) 136/60 mmHg (07/18 0628) SpO2:  [92 %-98 %] 92 % (07/18  [id number]) Weight:  [64.229 kg (141 lb 9.6 oz)] 64.229 kg (141 lb 9.6 oz) (07/18 0628)   General appearance: alert, cooperative and no distress Resp: clear to auscultation bilaterally Cardio: regular rate and rhythm GI: normal findings: bowel sounds normal and soft, non-tender  Lab Results  Recent Labs  06/28/16 0311 06/29/16 0343  WBC 40.7* 34.2*  HGB 7.9* 7.8*  HCT 21.5* 21.7*  NA 130* 131*  K 4.3 4.3  CL 94* 97*  CO2 28 29  BUN 14 14  CREATININE 0.49 0.57   Liver Panel  Recent Labs  06/28/16 0311 06/29/16 0343  PROT 4.7* 5.2*  ALBUMIN 2.1* 2.3*  AST 44* 31  ALT 60* 46  ALKPHOS 202* 185*  BILITOT 3.9* 3.5*   Sedimentation Rate No results for input(s): ESRSEDRATE in the last 72 hours. C-Reactive Protein No results for input(s): CRP in the last 72 hours.  Microbiology: Recent Results (from the past 240 hour(s))    Culture, blood (Routine X 2)     Status: None   Collection Time: 06/19/16 12:56 PM  Result Value Ref Range Status   Specimen Description BLOOD RIGHT ARM  Final   Special Requests BOTTLES DRAWN AEROBIC AND ANAEROBIC 5CC  Final   Culture   Final    NO GROWTH 5 DAYS Performed at [facility]    Report Status 06/24/2016 FINAL  Final  Culture, blood (Routine X 2)     Status: None   Collection Time: 06/19/16 12:56 PM  Result Value Ref Range Status   Specimen Description BLOOD LEFT HAND  Final   Special Requests BOTTLES DRAWN AEROBIC AND ANAEROBIC 5CC  Final   Culture   Final    NO GROWTH 5 DAYS Performed at [facility]    Report Status 06/24/2016 FINAL  Final  Urine culture     Status: Abnormal   Collection Time: 06/19/16  1:31 PM  Result Value Ref Range Status   Specimen Description URINE, CLEAN CATCH  Final   Special Requests NONE  Final   Culture (A)  Final    <10,000 COLONIES/mL INSIGNIFICANT GROWTH Performed at [facility]    Report Status 06/20/2016 FINAL  Final  MRSA PCR Screening     Status: None   Collection Time: 06/19/16  5:18 PM  Result Value Ref Range Status   MRSA by PCR NEGATIVE NEGATIVE Final    Comment:        The GeneXpert MRSA Assay (FDA approved for NASAL specimens only), is one component of a comprehensive MRSA colonization surveillance program. It is not intended to diagnose MRSA infection nor to guide or monitor treatment for MRSA infections.   Aerobic/Anaerobic Culture (surgical/deep wound)     Status: None (Preliminary result)   Collection Time: 06/21/16  4:40 PM  Result Value Ref Range Status   Specimen Description LIVER ABSCESS  Final   Special Requests NONE  Final   Gram Stain   Final    ABUNDANT WBC PRESENT, PREDOMINANTLY PMN NO ORGANISMS SEEN    Culture   Final    FEW ANAEROBIC GRAM NEGATIVE ROD IDENTIFICATION TO FOLLOW Performed at [facility]    Report Status PENDING  Incomplete     Studies/Results: No results found.   Assessment/Plan: Liver abscesses Her repeat CT scan (7-14) is unimproved. Her WBC has improved, she is afebrile.  Defer to IR regarding need to change drain, reposition.  No change in zosyn for now.  Cx with possible anaerobe, awaiting ID  Will send amoeba serologies upw with PT  Diverticuliltis? Pt relates LLQ pain 2 weeks pta  Urinary hesitancy  Fluid overload  Cold Agglutinin anemia apprciate heme f/u Consideration for other causes of her leukpcytosis (aside from infection) BM Bx next week  Protein Calorie Malnutrition Nutrition f/u Alb 2.3 today  Total days of antibiotics: 9 zosyn         Bobby Rumpf Infectious Diseases (pager) [phone] [email or web address] 06/29/2016, 10:06 AM  LOS: 10 days

## 2016-06-29 NOTE — Care Management Important Message (Signed)
Important Message  Patient Details  Name: Theola Gachupin MRN: [id number] Date of Birth: Jun 16, 1924   Medicare Important Message Given:  Yes    Camillo Flaming 06/29/2016, 10:02 AMImportant Message  Patient Details  Name: Chiana Macartney MRN: [id number] Date of Birth: [birth year]   Medicare Important Message Given:  Yes    Camillo Flaming 06/29/2016, 10:02 AM

## 2016-06-29 NOTE — Progress Notes (Signed)
[facility] Progress Note   Lauretta Brittan  [id number]  DOB: 03-26-1924  DOA: 06/19/2016 PCP: Tawanna Solo, MD  Brief narrative:  Brianna [name] is a 82 y.o. female with HTN, cold agglutinin disease presenting with temp of 103, jaundice and dark urine and weight loss of about 10 lbs. She had been feeling bad since 7/5 and was seen in the ER for syncope and discharged home. She returned to there ER after she was noted to be jaundiced. She was found to have elevated LFTS, WBC count of 40 and a CT scan showing right lobe hepatic abscesses. She also had a sodium of 125 and potassium of 5.7. Tylenol level was normal.   She is being followed by GI, oncology, ID and IR, she is on IV antibiotics per ID and has a right upper quadrant drain placed by IR. She also has issues with hemolytic anemia, she's been requiring transfusions, oncology is assisting in management of this problem.  Due to her liver abscess and poor appetite she has severe protein calorie malnutrition with third spacing of fluids which is being removed with gentle diuresis.   Subjective:  Patient in bed, she feels excellent today, denies any fever or chills, no headache chest or abdominal pain. She is currently not short of breath. Her urinary stream is improving. Her leg and foot discomfort is minimal and edema is going down..   Assessment/Plan: Principal Problem:   Hepatic abscess causing sepsis Sepsis,  Acute liver failure with Elevated LFTs & jaundice - ID, GI and IR following. She is currently on Zosyn under the guidance of ID- started 7/8, right upper quadrant drain placed by IR on 06/21/2016, cultures so far are negative. LFTs and total bilirubin have marginally improved, question if she has had a past gallstone due to her hemolytic anemia causing disruption and bowel integrity and liver abscess. Will defer duration and route of total IV antibiotics to ID.  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  - she was transfused 3 units of packed RBC on 06/20/2016, 2 units of packed RBC on 06/23/2016, and 2 more on 06/27/2016, cold agglutinin titer > 1: 4096. Oncology and following and assisting. There is question of underlying proliferative disorder.   ? if extravascular hemolysis is causing small asymptomatic gallstones with disruption and bowel integrity and causing liver abscess.   Leukocytosis - mostly neutrophils- suspecting possibly underlying proliferative disorder in addition to current infection, We'll defer management to oncology as age-appropriate.  Difficulty with urination - stable post-void residual, urinary stream is improving with increase activity.  Right mid back pain - Voltaren gel, pain is better.  Hyponatremia/ pedal edema - gentle diuresis with Lasix and monitor.  Hypoalbuminemia/ severe protein calorie malnutrition/ anasarca - - due to abscesses and poor appetite, continue protein supplementation, appetite improving, continue gentle diuresis as permitted by blood pressure and renal function. Dose Lasix daily.  Hypoxia on exertion -  pulm edema due to third spacing and fluid overload +  atelectasis due to being bed bound she was started on PO Lasix + IS, continue to gently diurese and monitor. Shortness of breath has considerably improved. has improved   Abdominal pain -   gas like  Better with  Gas x    Benign essential HTN - stable on  Metoprolol      Antibiotics: Anti-infectives    Start     Dose/Rate Route Frequency Ordered Stop   06/27/16 2300  piperacillin-tazobactam (ZOSYN) IVPB 3.375 g  3.375 g 12.5 mL/hr over 240 Minutes Intravenous Every 8 hours 06/27/16 1615     06/27/16 1700  piperacillin-tazobactam (ZOSYN) IVPB 3.375 g     3.375 g 100 mL/hr over 30 Minutes Intravenous  Once 06/27/16 1615 06/27/16 1746   06/27/16 1200  fluconazole (DIFLUCAN) tablet 100 mg     100 mg Oral Daily 06/27/16 1111     06/25/16 0930  fluconazole (DIFLUCAN) IVPB 100 mg   Status:  Discontinued     100 mg 50 mL/hr over 60 Minutes Intravenous Every 24 hours 06/25/16 0841 06/27/16 1110   06/20/16 1400  vancomycin (VANCOCIN) 500 mg in sodium chloride 0.9 % 100 mL IVPB  Status:  Discontinued     500 mg 100 mL/hr over 60 Minutes Intravenous Every 24 hours 06/19/16 1254 06/23/16 1550   06/19/16 2200  piperacillin-tazobactam (ZOSYN) IVPB 3.375 g  Status:  Discontinued     3.375 g 12.5 mL/hr over 240 Minutes Intravenous Every 8 hours 06/19/16 1254 06/27/16 1615   06/19/16 1245  piperacillin-tazobactam (ZOSYN) IVPB 3.375 g     3.375 g 100 mL/hr over 30 Minutes Intravenous  Once 06/19/16 1242 06/19/16 1335   06/19/16 1245  vancomycin (VANCOCIN) IVPB 1000 mg/200 mL premix     1,000 mg 200 mL/hr over 60 Minutes Intravenous  Once 06/19/16 1242 06/19/16 1456     Code Status: DNR Family Communication: daughter bedside Disposition Plan: to be determined- cont to follow in hospital until ID and hematology decide when she would be safe to discharge DVT prophylaxis: SCDS Consultants: hematology, GI, ID Procedures: RUQ drain    Objective: Filed Weights   06/27/16 0500 06/28/16 0500 06/29/16 0628  Weight: 64.3 kg (141 lb 12.1 oz) 63.5 kg (139 lb 15.9 oz) 64.229 kg (141 lb 9.6 oz)    Intake/Output Summary (Last 24 hours) at 06/29/16 1017 Last data filed at 06/29/16 [id number]  Gross per 24 hour  Intake    240 ml  Output    250 ml  Net    -10 ml     Vitals Filed Vitals:   06/28/16 1600 06/28/16 1854 06/28/16 2140 06/29/16 0628  BP: 138/84 154/76 130/86 136/60  Pulse: 89 103 98 83  Temp: 99.4 F (37.4 C)  98.7 F (37.1 C) 98.1 F (36.7 C)  TempSrc: Oral Oral Oral Oral  Resp: 23 19 20 18   Height:      Weight:    64.229 kg (141 lb 9.6 oz)  SpO2: 92% 94% 98% 92%    Exam:  General:  Pt is alert, not in acute distress  HEENT: icterus improving, No thrush, oral mucosa moist  Cardiovascular: regular rate and rhythm, S1/S2 No murmur  Respiratory: clear to  auscultation bilaterally   Abdomen: Soft, +Bowel sounds, distended, no guarding- drain in place with bloody fluid  MSK: No cyanosis or clubbing- 1+ piting pedal edema   Data Reviewed: Basic Metabolic Panel:  Recent Labs Lab 06/25/16 0510 06/26/16 0335 06/27/16 0325 06/28/16 0311 06/29/16 0343  NA 128* 129* 129* 130* 131*  K 3.7 3.2* 3.9 4.3 4.3  CL 96* 94* 93* 94* 97*  CO2 25 27 30 28 29   GLUCOSE 107* 108* 107* 102* 95  BUN 14 14 14 14 14   CREATININE 0.43* 0.42* 0.54 0.49 0.57  CALCIUM 7.7* 7.7* 7.7* 7.9* 7.8*   Liver Function Tests:  Recent Labs Lab 06/25/16 0510 06/26/16 0335 06/27/16 0325 06/28/16 0311 06/29/16 0343  AST 51* 38 35 44* 31  ALT 87* 76*  63* 60* 46  ALKPHOS 258* 231* 210* 202* 185*  BILITOT 5.5* 5.1* 3.8* 3.9* 3.5*  PROT 4.9* 5.2* 4.9* 4.7* 5.2*  ALBUMIN 2.1* 2.1* 2.1* 2.1* 2.3*   No results for input(s): LIPASE, AMYLASE in the last 168 hours. No results for input(s): AMMONIA in the last 168 hours. CBC:  Recent Labs Lab 06/26/16 0335 06/26/16 1648 06/27/16 0325 06/28/16 0311 06/29/16 0343  WBC 48.3* 47.4* 38.1* 40.7* 34.2*  NEUTROABS  --   --  30.0*  --   --   HGB 7.8* 7.6* 6.7* 7.9* 7.8*  HCT 22.1* 21.6* 19.3* 21.5* 21.7*  MCV 88.4 90.8 89.8 91.1 93.1  PLT 648* 711* 680* 651* 694*   Cardiac Enzymes: No results for input(s): CKTOTAL, CKMB, CKMBINDEX, TROPONINI in the last 168 hours. BNP (last 3 results) No results for input(s): BNP in the last 8760 hours.  ProBNP (last 3 results) No results for input(s): PROBNP in the last 8760 hours.  CBG:  Recent Labs Lab 06/28/16 1201  GLUCAP 145*    Recent Results (from the past 240 hour(s))  Culture, blood (Routine X 2)     Status: None   Collection Time: 06/19/16 12:56 PM  Result Value Ref Range Status   Specimen Description BLOOD RIGHT ARM  Final   Special Requests BOTTLES DRAWN AEROBIC AND ANAEROBIC 5CC  Final   Culture   Final    NO GROWTH 5 DAYS Performed at [facility]    Report Status 06/24/2016 FINAL  Final  Culture, blood (Routine X 2)     Status: None   Collection Time: 06/19/16 12:56 PM  Result Value Ref Range Status   Specimen Description BLOOD LEFT HAND  Final   Special Requests BOTTLES DRAWN AEROBIC AND ANAEROBIC 5CC  Final   Culture   Final    NO GROWTH 5 DAYS Performed at [facility]    Report Status 06/24/2016 FINAL  Final  Urine culture     Status: Abnormal   Collection Time: 06/19/16  1:31 PM  Result Value Ref Range Status   Specimen Description URINE, CLEAN CATCH  Final   Special Requests NONE  Final   Culture (A)  Final    <10,000 COLONIES/mL INSIGNIFICANT GROWTH Performed at [facility]    Report Status 06/20/2016 FINAL  Final  MRSA PCR Screening     Status: None   Collection Time: 06/19/16  5:18 PM  Result Value Ref Range Status   MRSA by PCR NEGATIVE NEGATIVE Final    Comment:        The GeneXpert MRSA Assay (FDA approved for NASAL specimens only), is one component of a comprehensive MRSA colonization surveillance program. It is not intended to diagnose MRSA infection nor to guide or monitor treatment for MRSA infections.   Aerobic/Anaerobic Culture (surgical/deep wound)     Status: None (Preliminary result)   Collection Time: 06/21/16  4:40 PM  Result Value Ref Range Status   Specimen Description LIVER ABSCESS  Final   Special Requests NONE  Final   Gram Stain   Final    ABUNDANT WBC PRESENT, PREDOMINANTLY PMN NO ORGANISMS SEEN    Culture   Final    FEW ANAEROBIC GRAM NEGATIVE ROD IDENTIFICATION TO FOLLOW Performed at [facility]    Report Status PENDING  Incomplete     Studies: No results found.  Scheduled Meds:  Scheduled Meds: . antiseptic oral rinse  7 mL Mouth Rinse BID  . conjugated estrogens  1 Applicatorful Vaginal Once per day on Mon Wed Fri  . diclofenac sodium  2 g Topical QID  . feeding supplement (ENSURE ENLIVE)  237 mL Oral TID BM  . feeding supplement  (PRO-STAT SUGAR FREE 64)  30 mL Oral BID  . fluconazole  100 mg Oral Daily  . folic acid  1 mg Oral Daily  . metoprolol succinate  25 mg Oral Daily  . multivitamin with minerals  1 tablet Oral Daily  . piperacillin-tazobactam (ZOSYN)  IV  3.375 g Intravenous Q8H  . polyethylene glycol  17 g Oral BID  . ranitidine  150 mg Oral BID  . simethicone  80 mg Oral QID  . sodium chloride flush  3 mL Intravenous Q12H  . sodium chloride  1 g Oral BID WC   Continuous Infusions:    Time spent on care of this patient: 35 min  Signature  Lala Lund K M.D on 06/29/2016 at 10:17 AM  Between 7am to 7pm - Pager - [phone], After 7pm go to [email or web address] - password [facility]  [facility]  - Office  [phone]

## 2016-06-29 NOTE — Progress Notes (Signed)
Referring Physician(s): Dr.  [name]  Supervising Physician: [name]  Patient Status:  Inpatient  Chief Complaint:  Hepatic abscesses = s/p image guided drain placement by Dr. Vernard Gambles 06/21/2016  Subjective:  Ms Galluccio is doing pretty well today. She has been transferred out of the ICU  She denies any RUQ pain at this time.  Daughter is at her bedside.  Allergies: Other; Compazine; Erythromycin; Lyrica; Promethazine; Tramadol; and Vistaril  Medications: Prior to Admission medications   Medication Sig Start Date End Date Taking? Authorizing Provider  acetaminophen (TYLENOL) 500 MG tablet Take 1,000 mg by mouth every 6 (six) hours as needed for mild pain, moderate pain, fever or headache.   Yes Historical Provider, MD  conjugated estrogens (PREMARIN) vaginal cream Place 1 Applicatorful vaginally 3 (three) times a week.   Yes Historical Provider, MD  diphenhydrAMINE (BENADRYL) 25 MG tablet Take 25 mg by mouth at bedtime as needed for sleep.    Yes Historical Provider, MD  famotidine (PEPCID) 10 MG tablet Take 10 mg by mouth daily as needed for heartburn or indigestion.   Yes Historical Provider, MD  ibuprofen (ADVIL,MOTRIN) 200 MG tablet Take 400 mg by mouth every 6 (six) hours as needed for fever, headache, mild pain, moderate pain or cramping.   Yes Historical Provider, MD  Melatonin 5 MG TABS Take 5 mg by mouth at bedtime as needed (sleep).   Yes Historical Provider, MD  metoprolol succinate (TOPROL-XL) 25 MG 24 hr tablet Take 1 tablet (25 mg total) by mouth daily. 12/13/13  Yes Robbie Lis, MD  Multiple Vitamin (MULTIVITAMIN WITH MINERALS) TABS tablet Take 1 tablet by mouth daily.   Yes Historical Provider, MD  ondansetron (ZOFRAN) 8 MG tablet Take 8 mg by mouth every 8 (eight) hours as needed for nausea or vomiting.   Yes Historical Provider, MD  Polyethyl Glycol-Propyl Glycol (SYSTANE OP) Apply 2 drops to eye 3 (three) times daily as needed (dry eyes).   Yes  Historical Provider, MD  ibuprofen (ADVIL,MOTRIN) 800 MG tablet Take 0.5 tablets (400 mg total) by mouth 3 (three) times daily. Patient not taking: Reported on 06/19/2016 06/16/16   [name], MD     Vital Signs: BP 136/60 mmHg  Pulse 83  Temp(Src) 98.1 F (36.7 C) (Oral)  Resp 18  Ht 5' 3.5" (1.613 m)  Wt 141 lb 9.6 oz (64.229 kg)  BMI 24.69 kg/m2  SpO2 92%  Physical Exam  Awake and Alert Sitting up in chair Heart RRR Lungs clear bilaterally Abdomen soft, NTND Drain in place Flushes easily Really not much output at all.  Small amount of clear red tinged drainage in gravity.  Imaging: Ct Abdomen Pelvis W Contrast  06/25/2016  CLINICAL DATA:  82 year old female with hypertension. Jaundice. Loss of weight. Fevers. EXAM: CT ABDOMEN AND PELVIS WITH CONTRAST TECHNIQUE: Multidetector CT imaging of the abdomen and pelvis was performed using the standard protocol following bolus administration of intravenous contrast. CONTRAST:  127mL ISOVUE-300 IOPAMIDOL (ISOVUE-300) INJECTION 61% COMPARISON:  06/19/2016 FINDINGS: Lower chest: There are bilateral pleural effusions, right greater than left. These appear increased in volume when compared with previous exam. Hepatobiliary: Liver abscess ease are again identified. A percutaneous drainage catheter is in place. The larger of the 2 abscesses measures 4.8 cm, image number 48 of series 2. Previously 4.7 cm. The more lateral abscess measures 3.7 cm, image 34 of series 2. Previously 4.2 cm. No new fluid collections identified. Pancreas: There are a few scattered low-density  lesions within the pancreas which are not changed from previous exam. The largest is in the distal tail measuring 7 mm, image 43 of series 2. Spleen: The spleen appears normal. Adrenals/Urinary Tract: Indeterminate, intermediate attenuating lesion in the mid left kidney measures 1.7 cm and 39 HU, image 45 of series 2. Unchanged from previous study. The bladder is normal. Stomach/Bowel:  The stomach is normal. There is no pathologic dilatation of the small bowel loops. Moderate stool burden is identified throughout the colon. No pathologic dilatation of the large bowel loops identified. Vascular/Lymphatic: Calcified atherosclerotic disease involves the abdominal aorta. No aneurysm. No enlarged retroperitoneal or mesenteric adenopathy. No enlarged pelvic or inguinal lymph nodes. Reproductive: The uterus and adnexal structures are unremarkable. Other: There is a small amount of ascites noted within the dependent portion of the pelvis. The diffuse body wall edema is identified. A small amount of perihepatic ascites noted. Musculoskeletal: No aggressive lytic or sclerotic bone lesions. The bones appear osteopenic. There is a stable compression fractures involve T11 and L1. Degenerative disc disease is noted at L4-5. IMPRESSION: 1. Two liver abscesses are again identified within the right lobe. Overall there has been no significant change in size of these complex fluid collections. The percutaneous drainage catheter remains in appropriate position. 2. Similar appearance of indeterminate low-attenuation lesions within the spleen an intermediate attenuating foci within the left kidney. Although favored to represent benign foci these will require nonemergent follow-up imaging with contrast enhanced MRI of the upper abdomen per for bili performed following resolution of the patient's current clinical condition on an outpatient basis. 3. Aortic atherosclerosis 4. Increase in pleural effusions and body wall edema. Electronically Signed   By: Kerby Moors M.D.   On: 06/25/2016 18:25    Labs:  CBC:  Recent Labs  06/26/16 1648 06/27/16 0325 06/28/16 0311 06/29/16 0343  WBC 47.4* 38.1* 40.7* 34.2*  HGB 7.6* 6.7* 7.9* 7.8*  HCT 21.6* 19.3* 21.5* 21.7*  PLT 711* 680* 651* 694*    COAGS:  Recent Labs  06/20/16 0231 06/20/16 1330  INR 1.31 1.22    BMP:  Recent Labs  06/26/16 0335  06/27/16 0325 06/28/16 0311 06/29/16 0343  NA 129* 129* 130* 131*  K 3.2* 3.9 4.3 4.3  CL 94* 93* 94* 97*  CO2 27 30 28 29   GLUCOSE 108* 107* 102* 95  BUN 14 14 14 14   CALCIUM 7.7* 7.7* 7.9* 7.8*  CREATININE 0.42* 0.54 0.49 0.57  GFRNONAA >60 >60 >60 >60  GFRAA >60 >60 >60 >60    LIVER FUNCTION TESTS:  Recent Labs  06/26/16 0335 06/27/16 0325 06/28/16 0311 06/29/16 0343  BILITOT 5.1* 3.8* 3.9* 3.5*  AST 38 35 44* 31  ALT 76* 63* 60* 46  ALKPHOS 231* 210* 202* 185*  PROT 5.2* 4.9* 4.7* 5.2*  ALBUMIN 2.1* 2.1* 2.1* 2.3*    Mallampati Score: 1 ASA: 2  Assessment and Plan:  Hepatic abscesses = S/P drain by Dr. Vernard Gambles 06/21/2016  Still really not much output at all, likely fluid/abscess too think. Drain flushes easily.  CT reviewed by Dr. Laurence Ferrari.  Will plan for upsize of drain tomorrow. NPO after midnight.  Risks and Benefits discussed with the patient including bleeding, infection, damage to adjacent structures, bowel perforation/fistula connection, and sepsis.  All of the patient's questions were answered, patient is agreeable to proceed. Consent signed and in chart.   Electronically Signed: Murrell Redden PA-C 06/29/2016, 12:24 PM   I spent a total of 15 Minutes at  the the patient's bedside AND on the patient's hospital floor or unit, greater than 50% of which was counseling/coordinating care for f/u hepatic drain.

## 2016-06-29 NOTE — Evaluation (Signed)
Physical Therapy Evaluation Patient Details Name: Brianna [name] MRN: [id number] DOB: 15-Oct-1924 Today's Date: 06/29/2016   History of Present Illness  82 y/o female admitted 7/8 with sepsis with jaundice due to hepatic abscess. PMH includes spinal stenosis, thoracic vertebral fx  Clinical Impression  Pt admitted with above diagnosis. Pt currently with functional limitations due to the deficits listed below (see PT Problem List).  Pt will benefit from skilled PT to increase their independence and safety with mobility to allow discharge to the venue listed below.  Pt with generalized weakness, but very motivated to return to her PLOF.  Recommend OT consult.  Family will be in from out of town and can stay with her initially at time of d/c.  She has RW at home and recommended using it at this time.  Will need HHPT.     Follow Up Recommendations Home health PT    Equipment Recommendations  None recommended by PT    Recommendations for Other Services OT consult     Precautions / Restrictions Precautions Precautions: Fall Precaution Comments: drain      Mobility  Bed Mobility Overal bed mobility: Modified Independent             General bed mobility comments: bed flat without rail, without A, but slowly and difficult  Transfers Overall transfer level: Needs assistance Equipment used: Rolling walker (2 wheeled) Transfers: Sit to/from Stand Sit to Stand: Min guard;Min assist         General transfer comment: min/guard from bed and MIN A from toilet numerous reps  Ambulation/Gait Ambulation/Gait assistance: Min guard Ambulation Distance (Feet): 15 Feet (x2) Assistive device: Rolling walker (2 wheeled) Gait Pattern/deviations: Step-through pattern;Decreased stride length Gait velocity: decreased   General Gait Details: Amb slowly and deliberately. Able to turn RW into bathroom without A.  Stairs            Wheelchair Mobility    Modified Rankin (Stroke  Patients Only)       Balance Overall balance assessment: History of Falls                                           Pertinent Vitals/Pain Pain Assessment: No/denies pain    Home Living Family/patient expects to be discharged to:: Private residence Living Arrangements: Alone Available Help at Discharge: Available PRN/intermittently Type of Home:  (1st floor condo) Home Access: Stairs to enter   Entrance Stairs-Number of Steps: 1 Home Layout: One level Home Equipment: None Additional Comments: Daughter lives across street and checks in, but not available 24 hrs. Initially, other famiy will be in town and able to stay with pt.    Prior Function Level of Independence: Independent         Comments: Drives still     Hand Dominance   Dominant Hand: Right    Extremity/Trunk Assessment   Upper Extremity Assessment: Generalized weakness;Overall WFL for tasks assessed           Lower Extremity Assessment: Overall WFL for tasks assessed;Generalized weakness         Communication   Communication: No difficulties  Cognition Arousal/Alertness: Awake/alert Behavior During Therapy: WFL for tasks assessed/performed Overall Cognitive Status: Within Functional Limits for tasks assessed                      General Comments General comments (skin integrity, edema,  etc.): Pt able to stand at [facility] while PT A with hygiene.  Some fatigue noted and needed 2 rest breaks.    Exercises        Assessment/Plan    PT Assessment Patient needs continued PT services  PT Diagnosis Difficulty walking;Generalized weakness   PT Problem List Decreased activity tolerance;Decreased balance;Decreased mobility;Decreased knowledge of use of DME;Decreased strength  PT Treatment Interventions DME instruction;Gait training;Functional mobility training;Therapeutic activities;Therapeutic exercise;Balance training   PT Goals (Current goals can be found in the Care Plan  section) Acute Rehab PT Goals Patient Stated Goal: go home and return to PLOF PT Goal Formulation: With patient/family Time For Goal Achievement: 07/13/16 Potential to Achieve Goals: Good    Frequency Min 3X/week   Barriers to discharge        Co-evaluation               End of Session Equipment Utilized During Treatment: Gait belt Activity Tolerance: Patient tolerated treatment well;Patient limited by fatigue Patient left: in chair;with call bell/phone within reach;with chair alarm set Nurse Communication: Mobility status (encouraged nursing to ambulate her to BR with RW)         Time: [id number] PT Time Calculation (min) (ACUTE ONLY): 48 min   Charges:   PT Evaluation $PT Eval Moderate Complexity: 1 Procedure PT Treatments $Gait Training: 8-22 mins $Therapeutic Activity: 8-22 mins   PT G Codes:        Brianna [name] LUBECK 06/29/2016, 11:43 AM

## 2016-06-29 NOTE — Progress Notes (Signed)
IP PROGRESS NOTE  Subjective:   She reports feeling the best she has since hospital admission. The abdominal pain has improved.  Objective: Vital signs in last 24 hours: Blood pressure 136/60, pulse 83, temperature 98.1 F (36.7 C), temperature source Oral, resp. rate 18, height 5' 3.5" (1.613 m), weight 141 lb 9.6 oz (64.229 kg), SpO2 92 %.  Intake/Output from previous day: 07/17 0701 - 07/18 0700 In: 5  Out: 250 [Urine:250]  Physical Exam: HEENT: No thrush Lungs: Scattered end inspiratory rales at the posterior chest, no respiratory distress Abdomen: No hepatomegaly, right upper quadrant drain with a gauze dressing, nontender Vascular: Pitting edema at the left greater than right arm and lower legs/feet   Lab Results:  Recent Labs  06/28/16 0311 06/29/16 0343  WBC 40.7* 34.2*  HGB 7.9* 7.8*  HCT 21.5* 21.7*  PLT 651* 694*    BMET  Recent Labs  06/28/16 0311 06/29/16 0343  NA 130* 131*  K 4.3 4.3  CL 94* 97*  CO2 28 29  GLUCOSE 102* 95  BUN 14 14  CREATININE 0.49 0.57  CALCIUM 7.9* 7.8*  Alkaline phosphatase 238, AST 49, ALT 89, bilirubin 5.6   06/21/2016: IgM 1351, kappa free light chains 28, 0.8 mg/dL monoclonal protein, IgM kappa  Medications: I have reviewed the patient's current medications.  Assessment/Plan:   1. Fever, Abnormal liver enzymes, hyperbilirubinemia, hypodense liver lesions noted on CT 06/19/2016  Status post placement of a liver drain on 06/21/2010 2. 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 High titer cold agglutinin, serum monoclonal IgM kappa protein  The hemoglobin is stable and the hyperbilirubinemia has improved. The anemia is secondary to cold agglutinin related hemolysis, multiple blood draws, and infection.  The persistent leukocytosis and thrombocytosis are likely reactive to the acute infection. No evidence for circulating lymphoma on review of the peripheral blood smear last  week.  Recommendations: 1. Continue antibiotics per infectious disease 2. Transfuse packed red blood cells as needed for symptomatic anemia 3. No need to check CBC daily 4. Plan for diagnostic bone marrow biopsy next few weeks.    LOS: 10 days   Betsy Coder, MD   06/29/2016, 8:42 AM

## 2016-06-29 NOTE — Progress Notes (Signed)
Nutrition Follow-up  DOCUMENTATION CODES:   Non-severe (moderate) malnutrition in context of acute illness/injury  INTERVENTION:  -Juven 2 PKTs BID, each supplement provides 14 gm protein and 75 calories -D/C Ensure -D/C Pro-Stat -D/C MC  NUTRITION DIAGNOSIS:   Inadequate oral intake related to poor appetite, nausea as evidenced by energy intake < 75% for > 7 days. -Ongoing  GOAL:   Patient will meet greater than or equal to 90% of their needs -Progressing  MONITOR:   PO intake, Supplement acceptance, Weight trends, Labs, Skin, I & O's  REASON FOR ASSESSMENT:   Malnutrition Screening Tool    ASSESSMENT:   Brianna [name] is a 82 y.o. female with medical history significant of HTN, Thoracic vertebral fracture, who presents today with fevers, jaundice that started during last 24 hours. Patient relates that she was not feeling well on July 5, she pass out that day. She has been having ribs, chest pain since that day after she try to pull her self up. She was evaluated in the ED at that time , had negative CT and was discharge home.  Spoke with Brianna [name]'s daughter at bedside. Pt was using bedside comode during visit. She states pt is not consuming much today does not feel well, lots of pain per liver drain, also experiencing nausea. She is supposed to receive a larger drain tomorrow. Pt does not want Ensure anymore, was causing diarrhea - accepted one per day it appears per med history.  It is also believed boost breeze was causing heartburn. Pt does not want Magic Cup and did not like Pro-Stat either. Will try Juven 2 pkts BID, if she does not tolerate we can try snacks and/or milk.  Labs and medications reviewed: CBGS 145, Na 131, Ca 7.8, Album 2.3, Total bilirubin 3.5 Multivitamin with Minerals, folic Acid Miralax   Diet Order:  Diet regular Room service appropriate?: Yes; Fluid consistency:: Thin Diet NPO time specified Except for: Sips with Meds  Skin:   Reviewed, no issues  Last BM:  7/11  Height:   Ht Readings from Last 1 Encounters:  06/19/16 5' 3.5" (1.613 m)    Weight:   Wt Readings from Last 1 Encounters:  06/29/16 141 lb 9.6 oz (64.229 kg)    Ideal Body Weight:  53.4 kg  BMI:  Body mass index is 24.69 kg/(m^2).  Estimated Nutritional Needs:   Kcal:  1350-1550  Protein:  75-85 grams  Fluid:  1.3-1.5 L/day  EDUCATION NEEDS:   No education needs identified at this time  Satira Anis. Sussie Minor, MS, RD LDN Inpatient Clinical Dietitian Pager [phone]

## 2016-06-30 ENCOUNTER — Encounter (HOSPITAL_COMMUNITY): Payer: Self-pay | Admitting: Radiology

## 2016-06-30 ENCOUNTER — Inpatient Hospital Stay (HOSPITAL_COMMUNITY): Payer: Medicare Other

## 2016-06-30 DIAGNOSIS — E871 Hypo-osmolality and hyponatremia: Secondary | ICD-10-CM

## 2016-06-30 DIAGNOSIS — J189 Pneumonia, unspecified organism: Secondary | ICD-10-CM

## 2016-06-30 DIAGNOSIS — I10 Essential (primary) hypertension: Secondary | ICD-10-CM

## 2016-06-30 DIAGNOSIS — A419 Sepsis, unspecified organism: Principal | ICD-10-CM

## 2016-06-30 LAB — COMPREHENSIVE METABOLIC PANEL
ALK PHOS: 176 U/L — AB (ref 38–126)
ALT: 41 U/L (ref 14–54)
ANION GAP: 7 (ref 5–15)
AST: 28 U/L (ref 15–41)
Albumin: 2.4 g/dL — ABNORMAL LOW (ref 3.5–5.0)
BILIRUBIN TOTAL: 4.6 mg/dL — AB (ref 0.3–1.2)
BUN: 16 mg/dL (ref 6–20)
CALCIUM: 7.7 mg/dL — AB (ref 8.9–10.3)
CO2: 28 mmol/L (ref 22–32)
CREATININE: 0.58 mg/dL (ref 0.44–1.00)
Chloride: 95 mmol/L — ABNORMAL LOW (ref 101–111)
GFR calc non Af Amer: >60 mL/min (ref >60)
GLUCOSE: 106 mg/dL — AB (ref 65–99)
Potassium: 3.8 mmol/L (ref 3.5–5.1)
Sodium: 130 mmol/L — ABNORMAL LOW (ref 135–145)
Total Protein: 5.4 g/dL — ABNORMAL LOW (ref 6.5–8.1)

## 2016-06-30 LAB — CBC
HCT: 21.4 % — ABNORMAL LOW (ref 36.0–46.0)
Hemoglobin: 7.6 g/dL — ABNORMAL LOW (ref 12.0–15.0)
MCH: 33.2 pg (ref 26.0–34.0)
MCHC: 35.5 g/dL (ref 30.0–36.0)
MCV: 93.4 fL (ref 78.0–100.0)
PLATELETS: 696 10*3/uL — AB (ref 150–400)
RBC: 2.29 MIL/uL — AB (ref 3.87–5.11)
RDW: 18.3 % — ABNORMAL HIGH (ref 11.5–15.5)
WBC: 31.4 10*3/uL — ABNORMAL HIGH (ref 4.0–10.5)

## 2016-06-30 LAB — MAGNESIUM: MAGNESIUM: 2.2 mg/dL (ref 1.7–2.4)

## 2016-06-30 MED ORDER — MIDAZOLAM HCL 2 MG/2ML IJ SOLN
INTRAMUSCULAR | Status: AC
  Filled 2016-06-30: qty 2

## 2016-06-30 MED ORDER — MIDAZOLAM HCL 2 MG/2ML IJ SOLN
INTRAMUSCULAR | Status: AC
  Filled 2016-06-30: qty 4

## 2016-06-30 MED ORDER — MIDAZOLAM HCL 2 MG/2ML IJ SOLN
INTRAMUSCULAR | Status: AC | PRN
  Administered 2016-06-30: 0.5 mg via INTRAVENOUS
  Administered 2016-06-30: 1 mg via INTRAVENOUS
  Administered 2016-06-30 (×3): 0.5 mg via INTRAVENOUS

## 2016-06-30 MED ORDER — SODIUM CHLORIDE 0.9 % IV SOLN
3.0000 g | Freq: Three times a day (TID) | INTRAVENOUS | Status: DC
  Administered 2016-06-30 – 2016-07-06 (×17): 3 g via INTRAVENOUS
  Filled 2016-06-30 (×19): qty 3

## 2016-06-30 MED ORDER — ONDANSETRON HCL 4 MG/2ML IJ SOLN
INTRAMUSCULAR | Status: AC | PRN
  Administered 2016-06-30: 4 mg via INTRAVENOUS

## 2016-06-30 MED ORDER — ONDANSETRON HCL 4 MG/2ML IJ SOLN
INTRAMUSCULAR | Status: AC
  Filled 2016-06-30: qty 2

## 2016-06-30 MED ORDER — FENTANYL CITRATE (PF) 100 MCG/2ML IJ SOLN
INTRAMUSCULAR | Status: AC
  Filled 2016-06-30: qty 2

## 2016-06-30 MED ORDER — FENTANYL CITRATE (PF) 100 MCG/2ML IJ SOLN
INTRAMUSCULAR | Status: AC | PRN
  Administered 2016-06-30: 50 ug via INTRAVENOUS

## 2016-06-30 NOTE — Progress Notes (Signed)
This CM left [facility] provider list this am with pt to look over and chose one for [facility] services. When returning to room pt's daughters were in room and were insistent that pt dc to snf for IV abx. This CM made CSW aware of family wishes. Marney Doctor RN,BSN,NCM [phone]

## 2016-06-30 NOTE — Progress Notes (Signed)
INFECTIOUS DISEASE PROGRESS NOTE  ID: Brianna [name] is a 82 y.o. female with  Principal Problem:   Hepatic abscess Active Problems:   Spinal stenosis of lumbar region   Benign essential HTN   Sepsis (HCC)   Hyponatremia   Hyperkalemia   Jaundice   Elevated LFTs   Malnutrition of moderate degree   Hypoxia   Liver abscess   Hemolytic anemia (HCC)   Cold agglutinin disease (HCC)   IgM monoclonal gammopathy of uncertain significance   Leukocytosis  Subjective: Had prev yeast infection.  Had loose but formed BM. No diarrhea.  Has some pain from her drain, "it's draining now"  Abtx:  Anti-infectives    Start     Dose/Rate Route Frequency Ordered Stop   06/27/16 2300  piperacillin-tazobactam (ZOSYN) IVPB 3.375 g     3.375 g 12.5 mL/hr over 240 Minutes Intravenous Every 8 hours 06/27/16 1615     06/27/16 1700  piperacillin-tazobactam (ZOSYN) IVPB 3.375 g     3.375 g 100 mL/hr over 30 Minutes Intravenous  Once 06/27/16 1615 06/27/16 1746   06/27/16 1200  fluconazole (DIFLUCAN) tablet 100 mg     100 mg Oral Daily 06/27/16 1111     06/25/16 0930  fluconazole (DIFLUCAN) IVPB 100 mg  Status:  Discontinued     100 mg 50 mL/hr over 60 Minutes Intravenous Every 24 hours 06/25/16 0841 06/27/16 1110   06/20/16 1400  vancomycin (VANCOCIN) 500 mg in sodium chloride 0.9 % 100 mL IVPB  Status:  Discontinued     500 mg 100 mL/hr over 60 Minutes Intravenous Every 24 hours 06/19/16 1254 06/23/16 1550   06/19/16 2200  piperacillin-tazobactam (ZOSYN) IVPB 3.375 g  Status:  Discontinued     3.375 g 12.5 mL/hr over 240 Minutes Intravenous Every 8 hours 06/19/16 1254 06/27/16 1615   06/19/16 1245  piperacillin-tazobactam (ZOSYN) IVPB 3.375 g     3.375 g 100 mL/hr over 30 Minutes Intravenous  Once 06/19/16 1242 06/19/16 1335   06/19/16 1245  vancomycin (VANCOCIN) IVPB 1000 mg/200 mL premix     1,000 mg 200 mL/hr over 60 Minutes Intravenous  Once 06/19/16 1242 06/19/16 1456       Medications:  Scheduled: . ampicillin-sulbactam (UNASYN) IV  3 g Intravenous Q8H  . antiseptic oral rinse  7 mL Mouth Rinse BID  . conjugated estrogens  1 Applicatorful Vaginal Once per day on Mon Wed Fri  . diclofenac sodium  2 g Topical QID  . fluconazole  100 mg Oral Daily  . folic acid  1 mg Oral Daily  . metoprolol succinate  25 mg Oral Daily  . multivitamin with minerals  1 tablet Oral Daily  . nutrition supplement (JUVEN)  2 packet Oral BID BM  . polyethylene glycol  17 g Oral BID  . ranitidine  150 mg Oral BID  . simethicone  80 mg Oral QID  . sodium chloride flush  3 mL Intravenous Q12H  . sodium chloride  1 g Oral BID WC    Objective: Vital signs in last 24 hours: Temp:  [99 F (37.2 C)-99.3 F (37.4 C)] 99.1 F (37.3 C) (07/19 1340) Pulse Rate:  [96-112] 108 (07/19 1605) Resp:  [16-25] 20 (07/19 1605) BP: (146-153)/(66-79) 151/66 mmHg (07/19 1605) SpO2:  [93 %-98 %] 98 % (07/19 1605) Weight:  [62.097 kg (136 lb 14.4 oz)] 62.097 kg (136 lb 14.4 oz) (07/19 0553)   General appearance: alert, cooperative and no distress Resp: clear to auscultation bilaterally  Cardio: regular rate and rhythm GI: normal findings: bowel sounds normal and soft, non-tender and drain bulb with bloody, clear fluid.   Lab Results  Recent Labs  06/29/16 0343 06/30/16 0342  WBC 34.2* 31.4*  HGB 7.8* 7.6*  HCT 21.7* 21.4*  NA 131* 130*  K 4.3 3.8  CL 97* 95*  CO2 29 28  BUN 14 16  CREATININE 0.57 0.58   Liver Panel  Recent Labs  06/29/16 0343 06/30/16 0342  PROT 5.2* 5.4*  ALBUMIN 2.3* 2.4*  AST 31 28  ALT 46 41  ALKPHOS 185* 176*  BILITOT 3.5* 4.6*   Sedimentation Rate No results for input(s): ESRSEDRATE in the last 72 hours. C-Reactive Protein No results for input(s): CRP in the last 72 hours.  Microbiology: Recent Results (from the past 240 hour(s))  Aerobic/Anaerobic Culture (surgical/deep wound)     Status: None   Collection Time: 06/21/16  4:40 PM   Result Value Ref Range Status   Specimen Description LIVER ABSCESS  Final   Special Requests NONE  Final   Gram Stain   Final    ABUNDANT WBC PRESENT, PREDOMINANTLY PMN NO ORGANISMS SEEN    Culture   Final    FEW ANAEROBIC GRAM NEGATIVE ROD BETA LACTAMASE NEGATIVE Performed at [facility]    Report Status 06/29/2016 FINAL  Final    Studies/Results: No results found.   Assessment/Plan: Liver abscesses Her repeat CT scan (7-14) is unimproved. Drain exchanged yesterday Her WBC continues to improve, she is afebrile.  Drain out 30 last 24h.  Cx- "anaerobe, beta-lactamase negative" is all the info the lab has. (fusobacterium> bacteroides). Will change her to unasyn. Consider augmentin at d/c. Length of therapy determined by f/u CT Await amoeba serologies Continue PT  Diverticulitis? Pt relates LLQ pain 2 weeks PTA  Urinary hesitancy  Fluid overload  Cold Agglutinin anemia appreciate heme f/u Consideration for other causes of her leukocytosis (aside from infection) BM Bx soon?  Protein Calorie Malnutrition Nutrition f/u Alb 2.4  Total days of antibiotics: 10 zosyn --> 1 unasyn         Bobby Rumpf Infectious Diseases (pager) [phone] [email or web address] 06/30/2016, 4:07 PM  LOS: 11 days

## 2016-06-30 NOTE — Clinical Social Work Placement (Signed)
   CLINICAL SOCIAL WORK PLACEMENT  NOTE  Date:  06/30/2016  Patient Details  Name: Brianna [name] MRN: [id number] Date of Birth: 01/22/1924  Clinical Social Work is seeking post-discharge placement for this patient at the [place] level of care (*CSW will initial, date and re-position this form in  chart as items are completed):  Yes   Patient/family provided with [place] Work Department's list of facilities offering this level of care within the geographic area requested by the patient (or if unable, by the patient's family).  Yes   Patient/family informed of their freedom to choose among providers that offer the needed level of care, that participate in Medicare, Medicaid or managed care program needed by the patient, have an available bed and are willing to accept the patient.  Yes   Patient/family informed of [place]'s ownership interest in [facility] and [facility], as well as of the fact that they are under no obligation to receive care at these facilities.  PASRR submitted to EDS on 06/30/16     PASRR number received on 06/30/16     Existing PASRR number confirmed on       FL2 transmitted to all facilities in geographic area requested by pt/family on       FL2 transmitted to all facilities within larger geographic area on 06/30/16     Patient informed that his/her managed care company has contracts with or will negotiate with certain facilities, including the following:            Patient/family informed of bed offers received.  Patient chooses bed at       Physician recommends and patient chooses bed at      Patient to be transferred to   on  .  Patient to be transferred to facility by       Patient family notified on   of transfer.  Name of family member notified:        PHYSICIAN       Additional Comment:    _______________________________________________ Weston Anna, LCSW 06/30/2016, 4:04 PM

## 2016-06-30 NOTE — Procedures (Signed)
Interventional Radiology Procedure Note  Procedure:  CT guided drain exchange  Complications:  None  Estimated Blood Loss: < 10 mL  New 12 Fr drain placed into larger abscess cavity. The smaller residual area is not liquified when needle placed into it and a second drain could not be formed in this area. Will manage with suction bulb drainage and flushes.  Venetia Night. Kathlene Cote, M.D Pager:  [phone]

## 2016-06-30 NOTE — NC FL2 (Signed)
[place] LEVEL OF CARE SCREENING TOOL     IDENTIFICATION  Patient Name: Brianna [name] Birthdate: [birth year] Sex: female Admission Date (Current Location): 06/19/2016  [facility] and [place] Number:  Herbalist and Address:  [facility],  [place] [address], [place]      Provider Number: [id number]  Attending Physician Name and Address:  Murlean Iba, MD  Relative Name and Phone Number:       Current Level of Care: Hospital Recommended Level of Care: [employer] Prior Approval Number:    Date Approved/Denied:   PASRR Number: [id number]  Discharge Plan: SNF    Current Diagnoses: Patient Active Problem List   Diagnosis Date Noted  . Leukocytosis   . 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 IgM monoclonal gammopathy of uncertain significance   . Hemolytic anemia ([place])   . Malnutrition of moderate degree 06/24/2016  . Elevated LFTs   . Hypoxia   . Liver abscess   . Hepatic abscess   . Jaundice   . Sepsis ([place]) 06/19/2016  . Hyponatremia 06/19/2016  . Hyperkalemia 06/19/2016  . Osteoporosis 10/17/2015  . Spinal stenosis of lumbar region 10/17/2015  . DDD (degenerative disc disease), lumbar 10/17/2015  . Fracture, thoracic vertebra ([place]) 10/17/2015  . Benign essential HTN 10/17/2015    Orientation RESPIRATION BLADDER Height & Weight     Self, Time, Situation, Place  O2 (2L) Continent Weight: 136 lb 14.4 oz (62.097 kg) Height:  5' 3.5" (161.3 cm)  BEHAVIORAL SYMPTOMS/MOOD NEUROLOGICAL BOWEL NUTRITION STATUS      Continent Diet (NPO after midnight)  AMBULATORY STATUS COMMUNICATION OF NEEDS Skin   Limited Assist Verbally Normal                       Personal Care Assistance Level of Assistance  Bathing, Dressing Bathing Assistance: Limited assistance   Dressing Assistance: Limited assistance     Functional Limitations Info             SPECIAL CARE FACTORS FREQUENCY  PT (By licensed PT), OT  (By licensed OT)     PT Frequency: 5 OT Frequency: 5            Contractures      Additional Factors Info  Code Status, Allergies Code Status Info: Full Code  Allergies Info: Allergies:  Other, Compazine, Erythromycin, Lyrica, Promethazine, Tramadol, Vistaril           Current Medications (06/30/2016):  This is the current hospital active medication list Current Facility-Administered Medications  Medication Dose Route Frequency Provider Last Rate Last Dose  . ALPRAZolam Duanne Moron) tablet 0.5 mg  0.5 mg Oral QHS PRN Donne Hazel, MD   0.5 mg at 06/29/16 2146  . antiseptic oral rinse (CPC / CETYLPYRIDINIUM CHLORIDE 0.05%) solution 7 mL  7 mL Mouth Rinse BID Belkys A Regalado, MD   7 mL at 06/30/16 1000  . benzonatate (TESSALON) capsule 100 mg  100 mg Oral TID PRN Belkys A Regalado, MD   100 mg at 06/25/16 2245  . conjugated estrogens (PREMARIN) vaginal cream 1 Applicatorful  1 Applicatorful Vaginal Once per day on Mon Wed Fri Elmarie Shiley, MD   1 Applicatorful at [phone]  . diclofenac sodium (VOLTAREN) 1 % transdermal gel 2 g  2 g Topical QID Debbe Odea, MD   2 g at 06/30/16 1519  . diphenhydrAMINE (BENADRYL) capsule 25 mg  25 mg Oral QHS PRN Belkys  A Regalado, MD   25 mg at 06/21/16 2233  . fentaNYL (SUBLIMAZE) 100 MCG/2ML injection           . fentaNYL (SUBLIMAZE) injection 12.5 mcg  12.5 mcg Intravenous Q4H PRN Debbe Odea, MD   12.5 mcg at 06/30/16 1510  . fluconazole (DIFLUCAN) tablet 100 mg  100 mg Oral Daily Debbe Odea, MD   100 mg at 06/29/16 0943  . folic acid (FOLVITE) tablet 1 mg  1 mg Oral Daily Ladell Pier, MD   1 mg at 06/29/16 0943  . ibuprofen (ADVIL,MOTRIN) tablet 400 mg  400 mg Oral Q6H PRN Belkys A Regalado, MD   400 mg at 06/28/16 2159  . metoprolol succinate (TOPROL-XL) 24 hr tablet 25 mg  25 mg Oral Daily Belkys A Regalado, MD   25 mg at 06/30/16 0933  . midazolam (VERSED) 2 MG/2ML injection           . multivitamin with minerals tablet 1  tablet  1 tablet Oral Daily Belkys A Regalado, MD   1 tablet at 06/29/16 0943  . nutrition supplement (JUVEN) (JUVEN) powder packet 2 packet  2 packet Oral BID BM Thurnell Lose, MD   2 packet at 06/30/16 1000  . ondansetron (ZOFRAN) tablet 4 mg  4 mg Oral Q6H PRN Belkys A Regalado, MD   4 mg at 06/30/16 0007   Or  . ondansetron (ZOFRAN) injection 4 mg  4 mg Intravenous Q6H PRN Belkys A Regalado, MD   4 mg at 06/20/16 2105  . ondansetron (ZOFRAN) tablet 8 mg  8 mg Oral Q8H PRN Belkys A Regalado, MD   8 mg at 06/22/16 1835  . piperacillin-tazobactam (ZOSYN) IVPB 3.375 g  3.375 g Intravenous Q8H Berton Mount, RPH   3.375 g at 06/30/16 1400  . polyethylene glycol (MIRALAX / GLYCOLAX) packet 17 g  17 g Oral BID Amy S Esterwood, PA-C   17 g at 06/29/16 2150  . ranitidine (ZANTAC) 150 MG/10ML syrup 150 mg  150 mg Oral BID Debbe Odea, MD   150 mg at 06/29/16 2145  . simethicone (MYLICON) chewable tablet 80 mg  80 mg Oral QID PRN Donne Hazel, MD   80 mg at 06/25/16 1303  . simethicone (MYLICON) chewable tablet 80 mg  80 mg Oral QID Debbe Odea, MD   80 mg at 06/29/16 2146  . sodium chloride flush (NS) 0.9 % injection 3 mL  3 mL Intravenous Q12H Belkys A Regalado, MD   3 mL at 06/29/16 2154  . sodium chloride tablet 1 g  1 g Oral BID WC Debbe Odea, MD   1 g at 06/29/16 [id number]     Discharge Medications: Please see discharge summary for a list of discharge medications.  Relevant Imaging Results:  Relevant Lab Results:   Additional Information Pt will need IV abx for 10 days once ready to [place], LCSW

## 2016-06-30 NOTE — Progress Notes (Signed)
[facility] Progress Note   Brianna [name]  KGS:[id number]  DOB: [birth year]  DOA: 06/19/2016 PCP: Tawanna Solo, MD  Brief narrative:  Brianna [name] is a 82 y.o. female with HTN, cold agglutinin disease presenting with temp of 103, jaundice and dark urine and weight loss of about 10 lbs. She had been feeling bad since 7/5 and was seen in the ER for syncope and discharged home. She returned to there ER after she was noted to be jaundiced. She was found to have elevated LFTS, WBC count of 40 and a CT scan showing right lobe hepatic abscesses. She also had a sodium of 125 and potassium of 5.7. Tylenol level was normal.   She is being followed by GI, oncology, ID and IR, she is on IV antibiotics per ID and has a right upper quadrant drain placed by IR. She also has issues with hemolytic anemia, she's been requiring transfusions, oncology is assisting in management of this problem.  Due to her liver abscess and poor appetite she has severe protein calorie malnutrition with third spacing of fluids which is being removed with gentle diuresis.  Pt to return to have new larger drain placed by IR 7/19.   Subjective: Pt reports pain in RLQ and RUQ today.  To go to have new larger drain placed today by IR.    Assessment/Plan: Principal Problem:  Hepatic abscess causing sepsis Sepsis,  Acute liver failure with Elevated LFTs & jaundice - ID, GI and IR following. She is currently on Zosyn under the guidance of ID- started 7/8, right upper quadrant drain placed by IR on 06/21/2016, cultures so far are negative. LFTs and total bilirubin have marginally improved, question if she has had a past gallstone due to her hemolytic anemia causing disruption and bowel integrity and liver abscess. Will defer duration and route of total IV antibiotics to ID.  Pt to go to OR for new larger drain 7/19.    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 - she was transfused 3 units of packed RBC on  06/20/2016, 2 units of packed RBC on 06/23/2016, and 2 more on 06/27/2016, cold agglutinin titer > 1: 4096. Oncology and following and assisting. There is question of underlying proliferative disorder.   Leukocytosis - mostly neutrophils- suspecting possibly underlying proliferative disorder in addition to current infection, Heme/Onc planning for bone marrow biopsy in next couple of days.  Difficulty with urination - stable post-void residual, urinary stream is improving with increase activity but still having difficulty.  Right mid back pain - Voltaren gel, pain is better.  Hyponatremia/ pedal edema - gentle diuresis with Lasix and monitor.  Hypoalbuminemia/ Moderate protein calorie malnutrition/ anasarca  - due to abscesses and poor appetite, continue protein supplementation, appetite improving, continue gentle diuresis as permitted by blood pressure and renal function. Dose Lasix daily.  Hypoxia on exertion -  pulm edema due to third spacing and fluid overload +  atelectasis due to being bed bound she was started on PO Lasix + IS, continue to gently diurese and monitor. Shortness of breath has considerably improved.   Abdominal pain -   Persists. Hopefully will improve with placement of larger drain by IR 7/19.    Benign essential HTN - stable on  Metoprolol  Antibiotics: Anti-infectives    Start     Dose/Rate Route Frequency Ordered Stop   06/27/16 2300  piperacillin-tazobactam (ZOSYN) IVPB 3.375 g     3.375 g 12.5 mL/hr over 240 Minutes Intravenous Every 8  hours 06/27/16 1615     06/27/16 1700  piperacillin-tazobactam (ZOSYN) IVPB 3.375 g     3.375 g 100 mL/hr over 30 Minutes Intravenous  Once 06/27/16 1615 06/27/16 1746   06/27/16 1200  fluconazole (DIFLUCAN) tablet 100 mg     100 mg Oral Daily 06/27/16 1111     06/25/16 0930  fluconazole (DIFLUCAN) IVPB 100 mg  Status:  Discontinued     100 mg 50 mL/hr over 60 Minutes Intravenous Every 24 hours 06/25/16 0841 06/27/16 1110    06/20/16 1400  vancomycin (VANCOCIN) 500 mg in sodium chloride 0.9 % 100 mL IVPB  Status:  Discontinued     500 mg 100 mL/hr over 60 Minutes Intravenous Every 24 hours 06/19/16 1254 06/23/16 1550   06/19/16 2200  piperacillin-tazobactam (ZOSYN) IVPB 3.375 g  Status:  Discontinued     3.375 g 12.5 mL/hr over 240 Minutes Intravenous Every 8 hours 06/19/16 1254 06/27/16 1615   06/19/16 1245  piperacillin-tazobactam (ZOSYN) IVPB 3.375 g     3.375 g 100 mL/hr over 30 Minutes Intravenous  Once 06/19/16 1242 06/19/16 1335   06/19/16 1245  vancomycin (VANCOCIN) IVPB 1000 mg/200 mL premix     1,000 mg 200 mL/hr over 60 Minutes Intravenous  Once 06/19/16 1242 06/19/16 1456     Code Status: DNR Family Communication: daughter bedside Disposition Plan: to be determined- cont to follow in hospital until ID and hematology decide when she would be safe to discharge DVT prophylaxis: SCDS Consultants: hematology, GI, ID Procedures: RUQ drain  Objective: Filed Weights   06/28/16 0500 06/29/16 0628 06/30/16 0553  Weight: 63.5 kg (139 lb 15.9 oz) 64.229 kg (141 lb 9.6 oz) 62.097 kg (136 lb 14.4 oz)    Intake/Output Summary (Last 24 hours) at 06/30/16 0850 Last data filed at 06/30/16 0600  Gross per 24 hour  Intake    530 ml  Output   1230 ml  Net   -700 ml     Vitals Filed Vitals:   06/29/16 0628 06/29/16 1403 06/29/16 2145 06/30/16 0553  BP: 136/60 135/64 151/70 146/74  Pulse: 83 83 105 96  Temp: 98.1 F (36.7 C) 98.6 F (37 C) 99 F (37.2 C) 99.3 F (37.4 C)  TempSrc: Oral Oral Oral Oral  Resp: 18 20 18 18   Height:      Weight: 64.229 kg (141 lb 9.6 oz)   62.097 kg (136 lb 14.4 oz)  SpO2: 92% 97% 96% 96%    Exam:  General:  Pt is alert, not in acute distress  HEENT: mild scleral icterus, No thrush, oral mucosa moist  Cardiovascular: regular rate and rhythm, S1/S2 No murmur  Respiratory: clear to auscultation bilaterally   Abdomen: Soft, +Bowel sounds, distended, tenderness  RUQ/RLQ with light palpation, - drain in place with bloody fluid  MSK: No cyanosis or clubbing- 1+ pitting pedal edema   Data Reviewed: Basic Metabolic Panel:  Recent Labs Lab 06/26/16 0335 06/27/16 0325 06/28/16 0311 06/29/16 0343 06/30/16 0342  NA 129* 129* 130* 131* 130*  K 3.2* 3.9 4.3 4.3 3.8  CL 94* 93* 94* 97* 95*  CO2 27 30 28 29 28   GLUCOSE 108* 107* 102* 95 106*  BUN 14 14 14 14 16   CREATININE 0.42* 0.54 0.49 0.57 0.58  CALCIUM 7.7* 7.7* 7.9* 7.8* 7.7*  MG  --   --   --   --  2.2   Liver Function Tests:  Recent Labs Lab 06/26/16 0335 06/27/16 0325 06/28/16 0311 06/29/16  7340 06/30/16 0342  AST 38 35 44* 31 28  ALT 76* 63* 60* 46 41  ALKPHOS 231* 210* 202* 185* 176*  BILITOT 5.1* 3.8* 3.9* 3.5* 4.6*  PROT 5.2* 4.9* 4.7* 5.2* 5.4*  ALBUMIN 2.1* 2.1* 2.1* 2.3* 2.4*   No results for input(s): LIPASE, AMYLASE in the last 168 hours. No results for input(s): AMMONIA in the last 168 hours. CBC:  Recent Labs Lab 06/26/16 1648 06/27/16 0325 06/28/16 0311 06/29/16 0343 06/30/16 0342  WBC 47.4* 38.1* 40.7* 34.2* 31.4*  NEUTROABS  --  30.0*  --   --   --   HGB 7.6* 6.7* 7.9* 7.8* 7.6*  HCT 21.6* 19.3* 21.5* 21.7* 21.4*  MCV 90.8 89.8 91.1 93.1 93.4  PLT 711* 680* 651* 694* 696*   Cardiac Enzymes: No results for input(s): CKTOTAL, CKMB, CKMBINDEX, TROPONINI in the last 168 hours. BNP (last 3 results) No results for input(s): BNP in the last 8760 hours.  ProBNP (last 3 results) No results for input(s): PROBNP in the last 8760 hours.  CBG:  Recent Labs Lab 06/28/16 1201  GLUCAP 145*    Recent Results (from the past 240 hour(s))  Aerobic/Anaerobic Culture (surgical/deep wound)     Status: None   Collection Time: 06/21/16  4:40 PM  Result Value Ref Range Status   Specimen Description LIVER ABSCESS  Final   Special Requests NONE  Final   Gram Stain   Final    ABUNDANT WBC PRESENT, PREDOMINANTLY PMN NO ORGANISMS SEEN    Culture   Final     FEW ANAEROBIC GRAM NEGATIVE ROD BETA LACTAMASE NEGATIVE Performed at [facility]    Report Status 06/29/2016 FINAL  Final     Studies: No results found.  Scheduled Meds:  Scheduled Meds: . antiseptic oral rinse  7 mL Mouth Rinse BID  . conjugated estrogens  1 Applicatorful Vaginal Once per day on Mon Wed Fri  . diclofenac sodium  2 g Topical QID  . fluconazole  100 mg Oral Daily  . folic acid  1 mg Oral Daily  . metoprolol succinate  25 mg Oral Daily  . multivitamin with minerals  1 tablet Oral Daily  . nutrition supplement (JUVEN)  2 packet Oral BID BM  . piperacillin-tazobactam (ZOSYN)  IV  3.375 g Intravenous Q8H  . polyethylene glycol  17 g Oral BID  . ranitidine  150 mg Oral BID  . simethicone  80 mg Oral QID  . sodium chloride flush  3 mL Intravenous Q12H  . sodium chloride  1 g Oral BID WC   Continuous Infusions:    Time spent on care of this patient: 27 min  Signature  Irwin Brakeman M.D on 06/30/2016 at 8:50 AM  Between 7am to 7pm - Pager - [phone], After 7pm go to [email or web address] - password [facility]  [facility]  - Office  [phone]

## 2016-07-01 DIAGNOSIS — K7200 Acute and subacute hepatic failure without coma: Secondary | ICD-10-CM

## 2016-07-01 DIAGNOSIS — E440 Moderate protein-calorie malnutrition: Secondary | ICD-10-CM

## 2016-07-01 LAB — COMPREHENSIVE METABOLIC PANEL
ALT: 51 U/L (ref 14–54)
AST: 50 U/L — AB (ref 15–41)
Albumin: 2.4 g/dL — ABNORMAL LOW (ref 3.5–5.0)
Alkaline Phosphatase: 176 U/L — ABNORMAL HIGH (ref 38–126)
Anion gap: 7 (ref 5–15)
BUN: 16 mg/dL (ref 6–20)
CHLORIDE: 96 mmol/L — AB (ref 101–111)
CO2: 28 mmol/L (ref 22–32)
Calcium: 7.8 mg/dL — ABNORMAL LOW (ref 8.9–10.3)
Creatinine, Ser: 0.47 mg/dL (ref 0.44–1.00)
Glucose, Bld: 95 mg/dL (ref 65–99)
POTASSIUM: 4.5 mmol/L (ref 3.5–5.1)
Sodium: 131 mmol/L — ABNORMAL LOW (ref 135–145)
Total Bilirubin: 4.3 mg/dL — ABNORMAL HIGH (ref 0.3–1.2)
Total Protein: 5.5 g/dL — ABNORMAL LOW (ref 6.5–8.1)

## 2016-07-01 LAB — E. HISTOLYTICA ANTIBODY (AMOEBA AB): E histolytica Ab: NEGATIVE

## 2016-07-01 NOTE — Progress Notes (Signed)
Long discussion with patient and daughters regarding d/c plan. Patient asking appropriate questions about [employer] vs SNF. Explained difference in level of care, SNF staff provide care vs family provide bulk of care. Daughters very anxious about drain care and taking that responsibility on. Patient eager to get well, wants to go home but understands she needs a fair amount of assistance. Discussed PT evaluations, limited walking distance, easily fatigues, requires multiple rest breaks, min assist. Patient could benefit from short term SNF d/t deconditioned state, medical needs. Family encouraging patient to go to SNF, patient agreeable now with the understanding that home with [facility] would be the backup plan. Will continue to assist as needed, provide information, CSW faxed out and has bed at [facility] per family. Unsure of d/c, CSW states could be as early as tomorrow, family says they have been told patient will be here another week. Bone marrow biopsy planned for tomorrow am then will reassess.

## 2016-07-01 NOTE — Progress Notes (Signed)
PT Cancellation Note  Patient Details Name: [name] MRN: [id number] DOB: 07-Sep-1924   Cancelled Treatment:     Pt just got back to bed.  Family upset about PT eval rec home.  Daughter stated pt is significantly weak and lives alone.  Just getting to the bathroom, wears her out.  Family would like PT to return and re Eval but getting a bone marrow transplant tomorrow 8am.  Will consult LPT   Nathanial Rancher 07/01/2016, 2:47 PM

## 2016-07-01 NOTE — Progress Notes (Signed)
IP PROGRESS NOTE  Subjective:   She continues to feel better. She underwent placement of a new hepatic abscess drain yesterday.  Objective: Vital signs in last 24 hours: Blood pressure 158/80, pulse 118, temperature 98.3 F (36.8 C), temperature source Oral, resp. rate 20, height 5' 3.5" (1.613 m), weight 132 lb 1.6 oz (59.92 kg), SpO2 97 %.  Intake/Output from previous day: 07/19 0701 - 07/20 0700 In: 5  Out: 1055 [Urine:1025; Drains:30]  Physical Exam:  Abdomen: Nontender, right upper abdomen drain site with a gauze dressing Vascular: Trace pitting edema at the arms and legs   Lab Results:  Recent Labs  06/29/16 0343 06/30/16 0342  WBC 34.2* 31.4*  HGB 7.8* 7.6*  HCT 21.7* 21.4*  PLT 694* 696*    BMET  Recent Labs  06/30/16 0342 07/01/16 0401  NA 130* 131*  K 3.8 4.5  CL 95* 96*  CO2 28 28  GLUCOSE 106* 95  BUN 16 16  CREATININE 0.58 0.47  CALCIUM 7.7* 7.8*  Alkaline phosphatase 238, AST 49, ALT 89, bilirubin 5.6   06/21/2016: IgM 1351, kappa free light chains 28, 0.8 mg/dL monoclonal protein, IgM kappa  Medications: I have reviewed the patient's current medications.  Assessment/Plan:   1. Fever, Abnormal liver enzymes, hyperbilirubinemia, hypodense liver lesions noted on CT 06/19/2016  Status post placement of a liver drain on 06/21/2010, New drain placed 06/30/2016 2. 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 High titer cold agglutinin, serum monoclonal IgM kappa protein   She appears stable. The etiology of the cold agglutinin disease is unclear. She may have an underlying lymphoproliferative disorder. She would like to proceed with a bone marrow biopsy while she is in the hospital.  Recommendations: 1. Continue antibiotics per infectious disease 2. Transfuse packed red blood cells as needed for symptomatic anemia 3. Check CBC and transfuse as indicated on 07/02/2016 4. Proceed with diagnostic bone marrow  biopsy    LOS: 12 days   Betsy Coder, MD   07/01/2016, 8:42 AM

## 2016-07-01 NOTE — Clinical Social Work Note (Signed)
Clinical Social Work Assessment  Patient Details  Name: Brianna [name] MRN: [id number] Date of Birth: 1924/10/03  Date of referral:  07/01/16               Reason for consult:  Discharge Planning                Permission sought to share information with:  Facility Art therapist granted to share information::  Yes, Release of Information Signed  Name::        Agency::     Relationship::     Contact Information:     Housing/Transportation Living arrangements for the past 2 months:  Single Family Home Source of Information:  Patient Patient Interpreter Needed:  None Criminal Activity/Legal Involvement Pertinent to Current Situation/Hospitalization:    Significant Relationships:  Adult Children Lives with:  Self Do you feel safe going back to the place where you live?  No Need for family participation in patient care:  Yes (Comment)  Care giving concerns:  CSW received consult to speak with patient and family regarding discharge plans.    Social Worker assessment / plan:  CSW spoke with patient and patients daughters at bedside to discuss plans for discharge. Patient would prefer to go home with home health however her daughters are unsure if that is the best decision at this time. Patient wanted more information regarding home health and daughters wanted to know about about short-term rehab. Patient thought it would be a good idea to look at short-term rehab options as a back up plan but she made sure the CSW knew she wanted to go home. CSW contacted the CM to speak with them more about home-health. CSW faxed her information out to the SNF's in the area.  Employment status:  Retired Forensic scientist:  Medicare PT Recommendations:  Home with [facility] / Referral to community resources:  [place]  Patient/Family's Response to care:  Patient and patients family were appreciative of CSW.  Patient/Family's Understanding of and  Emotional Response to Diagnosis, Current Treatment, and Prognosis: Patient understood her treatment and current prognosis. Patient has received home-health before and would prefer that. Patients family believes that she would benefit from short-term rehab before returning home.   Emotional Assessment Appearance:  Appears stated age Attitude/Demeanor/Rapport:    Affect (typically observed):  Calm, Accepting, Appropriate Orientation:  Oriented to Self, Oriented to Place, Oriented to  Time, Oriented to Situation Alcohol / Substance use:    Psych involvement (Current and /or in the community):  No (Comment)  Discharge Needs  Concerns to be addressed:  Discharge Planning Concerns Readmission within the last 30 days:  No Current discharge risk:  None Barriers to Discharge:  No Barriers Identified   Weston Anna, LCSW 07/01/2016, 8:54 AM

## 2016-07-01 NOTE — Progress Notes (Signed)
[facility] Progress Note   Brianna [name]  MCN:[id number]  DOB: 08-21-1924  DOA: 06/19/2016 PCP: Tawanna Solo, MD  Brief narrative:  Brianna [name] is a 82 y.o. female with HTN, cold agglutinin disease presenting with temp of 103, jaundice and dark urine and weight loss of about 10 lbs. She had been feeling bad since 7/5 and was seen in the ER for syncope and discharged home. She returned to there ER after she was noted to be jaundiced. She was found to have elevated LFTS, WBC count of 40 and a CT scan showing right lobe hepatic abscesses. She also had a sodium of 125 and potassium of 5.7. Tylenol level was normal.   She is being followed by GI, oncology, ID and IR, she is on IV antibiotics per ID and has a right upper quadrant drain placed by IR. She also has issues with hemolytic anemia, she's been requiring transfusions, oncology is assisting in management of this problem.  Due to her liver abscess and poor appetite she has severe protein calorie malnutrition with third spacing of fluids which is being removed with gentle diuresis.  Pt to return to have new larger drain placed by IR 7/19.   Subjective: Pt reports pain in RLQ and RUQ today.  To go to have new larger drain placed today by IR.    Assessment/Plan: Principal Problem:  Hepatic abscess causing sepsis Sepsis,  Acute liver failure with Elevated LFTs & jaundice - ID, GI and IR following. She is currently on Zosyn under the guidance of ID- started 7/8, right upper quadrant drain placed by IR on 06/21/2016, cultures so far are negative. LFTs and total bilirubin have marginally improved, question if she has had a past gallstone due to her hemolytic anemia causing disruption and bowel integrity and liver abscess. Will defer duration and route of total IV antibiotics to ID.  Pt s/p new larger drain 7/19 which is draining more.    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 Oncology and following and assisting. There is question of underlying proliferative disorder. Likely will need transfusion tomorrow.  Recheck CBC in AM.    Leukocytosis - mostly neutrophils- suspecting possibly underlying proliferative disorder in addition to current infection, Heme/Onc planning for bone marrow biopsy in next couple of days.  Difficulty with urination - stable post-void residual, urinary stream is improving with increase activity but still having difficulty. Following.   Right mid back pain - Voltaren gel, pain is better.  Hyponatremia/ pedal edema - gentle diuresis with Lasix and monitor.  Hypoalbuminemia/ Moderate protein calorie malnutrition/ anasarca  - due to abscesses and poor appetite, continue protein supplementation, appetite improving, continue gentle diuresis as permitted by blood pressure and renal function. Dose Lasix daily.  Hypoxia on exertion -  pulm edema due to third spacing and fluid overload +  atelectasis due to being bed bound she was started on PO Lasix + IS, continue to gently diurese and monitor. Shortness of breath has considerably improved.   Abdominal pain -   Persists. Hopefully will improve with placement of larger drain by IR 7/19.    Benign essential HTN - stable on  Metoprolol  Antibiotics: Anti-infectives    Start     Dose/Rate Route Frequency Ordered Stop   06/30/16 1700  Ampicillin-Sulbactam (UNASYN) 3 g in sodium  chloride 0.9 % 100 mL IVPB     3 g 100 mL/hr over 60 Minutes Intravenous Every 8 hours 06/30/16 1618     06/27/16 2300  piperacillin-tazobactam (ZOSYN) IVPB 3.375 g  Status:  Discontinued     3.375 g 12.5 mL/hr over 240 Minutes Intravenous Every 8 hours 06/27/16 1615 06/30/16 1618   06/27/16 1700  piperacillin-tazobactam (ZOSYN) IVPB 3.375 g     3.375 g 100 mL/hr over 30 Minutes Intravenous  Once 06/27/16 1615  06/27/16 1746   06/27/16 1200  fluconazole (DIFLUCAN) tablet 100 mg     100 mg Oral Daily 06/27/16 1111     06/25/16 0930  fluconazole (DIFLUCAN) IVPB 100 mg  Status:  Discontinued     100 mg 50 mL/hr over 60 Minutes Intravenous Every 24 hours 06/25/16 0841 06/27/16 1110   06/20/16 1400  vancomycin (VANCOCIN) 500 mg in sodium chloride 0.9 % 100 mL IVPB  Status:  Discontinued     500 mg 100 mL/hr over 60 Minutes Intravenous Every 24 hours 06/19/16 1254 06/23/16 1550   06/19/16 2200  piperacillin-tazobactam (ZOSYN) IVPB 3.375 g  Status:  Discontinued     3.375 g 12.5 mL/hr over 240 Minutes Intravenous Every 8 hours 06/19/16 1254 06/27/16 1615   06/19/16 1245  piperacillin-tazobactam (ZOSYN) IVPB 3.375 g     3.375 g 100 mL/hr over 30 Minutes Intravenous  Once 06/19/16 1242 06/19/16 1335   06/19/16 1245  vancomycin (VANCOCIN) IVPB 1000 mg/200 mL premix     1,000 mg 200 mL/hr over 60 Minutes Intravenous  Once 06/19/16 1242 06/19/16 1456     Code Status: DNR Family Communication: daughter bedside Disposition Plan: to be determined- cont to follow in hospital until ID and hematology decide when she would be safe to discharge DVT prophylaxis: SCDS Consultants: hematology, GI, ID Procedures: RUQ drain  Objective: Filed Weights   06/29/16 0628 06/30/16 0553 07/01/16 0502  Weight: 64.229 kg (141 lb 9.6 oz) 62.097 kg (136 lb 14.4 oz) 59.92 kg (132 lb 1.6 oz)    Intake/Output Summary (Last 24 hours) at 07/01/16 1441 Last data filed at 07/01/16 1256  Gross per 24 hour  Intake    125 ml  Output    805 ml  Net   -680 ml     Vitals Filed Vitals:   06/30/16 1925 06/30/16 2250 07/01/16 0452 07/01/16 0502  BP: 130/83 139/67 158/80   Pulse: 68 103 118   Temp: 98.6 F (37 C) 98.6 F (37 C) 98.3 F (36.8 C)   TempSrc: Oral Oral Oral   Resp: _0 Height:      Weight:    59.92 kg (132 lb 1.6 oz)  SpO2: 96% 96% 97%     Exam:  General:  Pt is alert, not in acute  distress  HEENT: mild scleral icterus, No thrush, oral mucosa moist  Cardiovascular: regular rate and rhythm, S1/S2 No murmur  Respiratory: clear to auscultation bilaterally   Abdomen: Soft, +Bowel sounds, less distended, tenderness RUQ/RLQ with light palpation, - drain in place  MSK: No cyanosis or clubbing- 1+ pitting pedal edema   Data Reviewed: Basic Metabolic Panel:  Recent Labs Lab 06/27/16 0325 06/28/16 0311 06/29/16 0343 06/30/16 0342 07/01/16 0401  NA 129* 130* 131* 130* 131*  K 3.9 4.3 4.3 3.8 4.5  CL 93* 94* 97* 95* 96*  CO2 _1 GLUCOSE 107* 102* 95 106* 95  BUN _2 16  CREATININE 0.54 0.49 0.57 0.58 0.47  CALCIUM 7.7* 7.9* 7.8* 7.7* 7.8*  MG  --   --   --  2.2  --    Liver Function Tests:  Recent Labs Lab 06/27/16 0325 06/28/16 0311 06/29/16 0343 06/30/16 0342 07/01/16 0401  AST 35 44* 31 28 50*  ALT 63* 60* 46 41 51  ALKPHOS 210* 202* 185* 176* 176*  BILITOT 3.8* 3.9* 3.5* 4.6* 4.3*  PROT 4.9* 4.7* 5.2* 5.4* 5.5*  ALBUMIN 2.1* 2.1* 2.3* 2.4* 2.4*   No results for input(s): LIPASE, AMYLASE in the last 168 hours. No results for input(s): AMMONIA in the last 168 hours. CBC:  Recent Labs Lab 06/26/16 1648 06/27/16 0325 06/28/16 0311 06/29/16 0343 06/30/16 0342  WBC 47.4* 38.1* 40.7* 34.2* 31.4*  NEUTROABS  --  30.0*  --   --   --   HGB 7.6* 6.7* 7.9* 7.8* 7.6*  HCT 21.6* 19.3* 21.5* 21.7* 21.4*  MCV 90.8 89.8 91.1 93.1 93.4  PLT 711* 680* 651* 694* 696*   Cardiac Enzymes: No results for input(s): CKTOTAL, CKMB, CKMBINDEX, TROPONINI in the last 168 hours. BNP (last 3 results) No results for input(s): BNP in the last 8760 hours.  ProBNP (last 3 results) No results for input(s): PROBNP in the last 8760 hours.  CBG:  Recent Labs Lab 06/28/16 1201  GLUCAP 145*    Recent Results (from the past 240 hour(s))  Aerobic/Anaerobic Culture (surgical/deep wound)     Status: None   Collection Time: 06/21/16  4:40 PM   Result Value Ref Range Status   Specimen Description LIVER ABSCESS  Final   Special Requests NONE  Final   Gram Stain   Final    ABUNDANT WBC PRESENT, PREDOMINANTLY PMN NO ORGANISMS SEEN    Culture   Final    FEW ANAEROBIC GRAM NEGATIVE ROD BETA LACTAMASE NEGATIVE Performed at [facility]    Report Status 06/29/2016 FINAL  Final     Studies: Ct Abscess Cath Exchange  07/01/2016  INDICATION: Status post percutaneous drainage of 2 separate hepatic abscesses on 06/21/2016 with placement of an elongated drainage catheter across both collections. There has been worsening elevation of white blood cell count with CT on 06/25/2016 demonstrating slight reduction in a lateral right hepatic abscess and minimal change in a medial right lobe hepatic abscess. EXAM: CT ABSCESS CATH EXCHANGE MEDICATIONS: The patient is currently admitted to the hospital and receiving intravenous antibiotics. The antibiotics were administered within an appropriate time frame prior to the initiation of the procedure. ANESTHESIA/SEDATION: Fentanyl 50 mcg IV; Versed 3.0 mg IV Moderate Sedation Time:  62 minutes. The patient was continuously monitored during the procedure by the interventional radiology nurse under my direct supervision. COMPLICATIONS: None immediate. PROCEDURE: Informed written consent was obtained from the patient after a thorough discussion of the procedural risks, benefits and alternatives. All questions were addressed. Maximal Sterile Barrier Technique was utilized including caps, mask, sterile gowns, sterile gloves, sterile drape, hand hygiene and skin antiseptic. A timeout was performed prior to the initiation of the procedure. The pre-existing exiting hepatic drainage catheter and surrounding skin were prepped with chlorhexidine. During the procedure, 1% lidocaine was infiltrated to provide local anesthesia around the tube exit site. The current drainage catheter course was imaged by CT. The catheter  was cut and removed over a guidewire. A 5 French diagnostic catheter was then advanced into the liver parenchyma. The catheter and guidewire were manipulated to gain wire access into the deeper, more  medial hepatic abscess. A new 12 French drainage catheter was then advanced over the wire and into the collection. The catheter positioning was confirmed by CT. The catheter was flushed with saline and connected to a suction bulb. It was secured at the skin with a Prolene retention suture and StatLock device. The rest of the procedure was spent trying to gain separate access into the lateral hepatic abscess. 18 gauge trocar needles were advanced into this collection and guidewires advanced as well as attempts at aspiration. Ultimately, attempt to place a second new hepatic abscess drainage catheter were aborted. FINDINGS: Based on unenhanced CT images obtained, the lateral right lobe hepatic abscess clearly is significantly smaller than on the imaging obtained at the time of initial drainage. Maximal diameter of this area of residual collections/edema has decreased from approximately 4.3 cm down to 2.6 cm. The medial abscess is also smaller than original size with maximal diameter of approximately 4.1 cm compared to 4.7 cm prior to drainage. The pre-existing multi side-hole drainage catheter extends through both collections and terminates in the medial dome of the liver. With catheter exchange, there with success in placing a standard 12 French pigtail drainage catheter into the larger medial collection with return of some debris and bloody fluid. With multiple separate needle accesses of the more lateral collection, there was inability to aspirate free fluid. Guidewire advancement extended through the parenchyma and did not coil within the collection to allow placement of a second drain. Based on decreased in size as well as inability to accurately place a second drain in this collection, attempts at further new  drainage catheter placement were aborted. This area appears to be resolving and will be followed while the patient receives appropriate antibiotic therapy. IMPRESSION: 1. Successful exchange of the elongated multi side-hole drain placed initially with a standard 12 French pigtail drain formed in the larger residual medial right lobe hepatic abscess. This drain will be connected to suction bulb drainage and flushed 3 times daily. 2. The lateral hepatic abscess shows more significant size reduction after initial drainage. Attempts at placing a second drain in this collection were unsuccessful due to lack of liquefaction and inability to coiled a wire within the collection. This may be essentially resolved from a drainage perspective with some residual edema/fluid remaining. This collection will be followed as well as the remaining abscess containing a drain while the patient is receiving appropriate antibiotic therapy. CT resolution of hepatic collections with drainage can take weeks to months and once the patient is able to be discharged from the hospital, abscess drainage catheter management and status of collections can be followed in the IR drain Clinic. Electronically Signed   By: Aletta Edouard M.D.   On: 07/01/2016 09:01    Scheduled Meds:  Scheduled Meds: . ampicillin-sulbactam (UNASYN) IV  3 g Intravenous Q8H  . antiseptic oral rinse  7 mL Mouth Rinse BID  . conjugated estrogens  1 Applicatorful Vaginal Once per day on Mon Wed Fri  . diclofenac sodium  2 g Topical QID  . fluconazole  100 mg Oral Daily  . folic acid  1 mg Oral Daily  . metoprolol succinate  25 mg Oral Daily  . multivitamin with minerals  1 tablet Oral Daily  . nutrition supplement (JUVEN)  2 packet Oral BID BM  . polyethylene glycol  17 g Oral BID  . ranitidine  150 mg Oral BID  . simethicone  80 mg Oral QID  . sodium chloride flush  3  mL Intravenous Q12H  . sodium chloride  1 g Oral BID WC   Continuous Infusions:     Time spent on care of this patient: 29 min  Signature  Siara Gorder M.D on 07/01/2016 at 2:41 PM  Between 7am to 7pm - Pager - [phone], After 7pm go to [email or web address] - password [facility]  [facility]  - Office  [phone]

## 2016-07-01 NOTE — Progress Notes (Signed)
Referring Physician(s): Dr. Benay Spice  Supervising Physician: Marybelle Killings  Patient Status:  Inpatient  Chief Complaint: Hepatic abscess Anemia/Leukocytosis  Subjective: Pt now s/p drain exchange of hepatic abscess yesterday. See Dr. Kathlene Cote procedure report for details.  Feels ok, some soreness at drain site as expected.  Also now request for bone marrow biopsy due to anemia and persistent leukocytosis. +cold agglutinin, possible lymphoproliferative disorder. Family and pt agreeable after discussion with Dr. Benay Spice  Allergies: Other; Compazine; Erythromycin; Lyrica; Promethazine; Tramadol; and Vistaril  Medications:  Current facility-administered medications:  .  ALPRAZolam Duanne Moron) tablet 0.5 mg, 0.5 mg, Oral, QHS PRN, Donne Hazel, MD, 0.5 mg at 07/01/16 0118 .  Ampicillin-Sulbactam (UNASYN) 3 g in sodium chloride 0.9 % 100 mL IVPB, 3 g, Intravenous, Q8H, Campbell Riches, MD, 3 g at 07/01/16 1010 .  antiseptic oral rinse (CPC / CETYLPYRIDINIUM CHLORIDE 0.05%) solution 7 mL, 7 mL, Mouth Rinse, BID, Belkys A Regalado, MD, 7 mL at 06/30/16 2305 .  benzonatate (TESSALON) capsule 100 mg, 100 mg, Oral, TID PRN, Belkys A Regalado, MD, 100 mg at 06/25/16 2245 .  conjugated estrogens (PREMARIN) vaginal cream 1 Applicatorful, 1 Applicatorful, Vaginal, Once per day on Mon Wed Fri, Elmarie Shiley, MD, 1 Applicatorful at 50/56/97 1102 .  diclofenac sodium (VOLTAREN) 1 % transdermal gel 2 g, 2 g, Topical, QID, Debbe Odea, MD, 2 g at 07/01/16 1012 .  diphenhydrAMINE (BENADRYL) capsule 25 mg, 25 mg, Oral, QHS PRN, Belkys A Regalado, MD, 25 mg at 06/21/16 2233 .  fentaNYL (SUBLIMAZE) injection 12.5 mcg, 12.5 mcg, Intravenous, Q4H PRN, Debbe Odea, MD, 12.5 mcg at 06/30/16 1510 .  fluconazole (DIFLUCAN) tablet 100 mg, 100 mg, Oral, Daily, Debbe Odea, MD, 100 mg at 94/80/16 5537 .  folic acid (FOLVITE) tablet 1 mg, 1 mg, Oral, Daily, Ladell Pier, MD, 1 mg at 07/01/16 1011 .   ibuprofen (ADVIL,MOTRIN) tablet 400 mg, 400 mg, Oral, Q6H PRN, Belkys A Regalado, MD, 400 mg at 06/28/16 2159 .  metoprolol succinate (TOPROL-XL) 24 hr tablet 25 mg, 25 mg, Oral, Daily, Belkys A Regalado, MD, 25 mg at 07/01/16 1011 .  multivitamin with minerals tablet 1 tablet, 1 tablet, Oral, Daily, Belkys A Regalado, MD, 1 tablet at 07/01/16 1011 .  nutrition supplement (JUVEN) (JUVEN) powder packet 2 packet, 2 packet, Oral, BID BM, Thurnell Lose, MD, 2 packet at 07/01/16 1010 .  ondansetron (ZOFRAN) tablet 4 mg, 4 mg, Oral, Q6H PRN, 4 mg at 06/30/16 0007 **OR** ondansetron (ZOFRAN) injection 4 mg, 4 mg, Intravenous, Q6H PRN, Belkys A Regalado, MD, 4 mg at 06/20/16 2105 .  ondansetron (ZOFRAN) tablet 8 mg, 8 mg, Oral, Q8H PRN, Belkys A Regalado, MD, 8 mg at 06/22/16 1835 .  polyethylene glycol (MIRALAX / GLYCOLAX) packet 17 g, 17 g, Oral, BID, Amy S Esterwood, PA-C, 17 g at 07/01/16 1011 .  ranitidine (ZANTAC) 150 MG/10ML syrup 150 mg, 150 mg, Oral, BID, Debbe Odea, MD, 150 mg at 06/29/16 2145 .  simethicone (MYLICON) chewable tablet 80 mg, 80 mg, Oral, QID PRN, Donne Hazel, MD, 80 mg at 06/25/16 1303 .  simethicone (MYLICON) chewable tablet 80 mg, 80 mg, Oral, QID, Debbe Odea, MD, 80 mg at 07/01/16 1011 .  sodium chloride flush (NS) 0.9 % injection 3 mL, 3 mL, Intravenous, Q12H, Belkys A Regalado, MD, 3 mL at 06/29/16 2154 .  sodium chloride tablet 1 g, 1 g, Oral, BID WC, Debbe Odea, MD, 1 g at 07/01/16  1011    Vital Signs: BP 158/80 mmHg  Pulse 118  Temp(Src) 98.3 F (36.8 C) (Oral)  Resp 20  Ht 5' 3.5" (1.613 m)  Wt 132 lb 1.6 oz (59.92 kg)  BMI 23.03 kg/m2  SpO2 97%  Physical Exam  Constitutional: She is oriented to person, place, and time.  Neck: Normal range of motion. No tracheal deviation present.  Cardiovascular: Normal rate, regular rhythm and normal heart sounds.   Pulmonary/Chest: Effort normal and breath sounds normal. No respiratory distress.  Abdominal:    RUQ drain intact, mildly tender at site Output serosanguinous  Neurological: She is alert and oriented to person, place, and time.  Psychiatric: She has a normal mood and affect. Judgment normal.    Imaging: Ct Abscess Cath Exchange  07/01/2016  INDICATION: Status post percutaneous drainage of 2 separate hepatic abscesses on 06/21/2016 with placement of an elongated drainage catheter across both collections. There has been worsening elevation of white blood cell count with CT on 06/25/2016 demonstrating slight reduction in a lateral right hepatic abscess and minimal change in a medial right lobe hepatic abscess. EXAM: CT ABSCESS CATH EXCHANGE MEDICATIONS: The patient is currently admitted to the hospital and receiving intravenous antibiotics. The antibiotics were administered within an appropriate time frame prior to the initiation of the procedure. ANESTHESIA/SEDATION: Fentanyl 50 mcg IV; Versed 3.0 mg IV Moderate Sedation Time:  62 minutes. The patient was continuously monitored during the procedure by the interventional radiology nurse under my direct supervision. COMPLICATIONS: None immediate. PROCEDURE: Informed written consent was obtained from the patient after a thorough discussion of the procedural risks, benefits and alternatives. All questions were addressed. Maximal Sterile Barrier Technique was utilized including caps, mask, sterile gowns, sterile gloves, sterile drape, hand hygiene and skin antiseptic. A timeout was performed prior to the initiation of the procedure. The pre-existing exiting hepatic drainage catheter and surrounding skin were prepped with chlorhexidine. During the procedure, 1% lidocaine was infiltrated to provide local anesthesia around the tube exit site. The current drainage catheter course was imaged by CT. The catheter was cut and removed over a guidewire. A 5 French diagnostic catheter was then advanced into the liver parenchyma. The catheter and guidewire were  manipulated to gain wire access into the deeper, more medial hepatic abscess. A new 12 French drainage catheter was then advanced over the wire and into the collection. The catheter positioning was confirmed by CT. The catheter was flushed with saline and connected to a suction bulb. It was secured at the skin with a Prolene retention suture and StatLock device. The rest of the procedure was spent trying to gain separate access into the lateral hepatic abscess. 18 gauge trocar needles were advanced into this collection and guidewires advanced as well as attempts at aspiration. Ultimately, attempt to place a second new hepatic abscess drainage catheter were aborted. FINDINGS: Based on unenhanced CT images obtained, the lateral right lobe hepatic abscess clearly is significantly smaller than on the imaging obtained at the time of initial drainage. Maximal diameter of this area of residual collections/edema has decreased from approximately 4.3 cm down to 2.6 cm. The medial abscess is also smaller than original size with maximal diameter of approximately 4.1 cm compared to 4.7 cm prior to drainage. The pre-existing multi side-hole drainage catheter extends through both collections and terminates in the medial dome of the liver. With catheter exchange, there with success in placing a standard 12 French pigtail drainage catheter into the larger medial collection with return of  some debris and bloody fluid. With multiple separate needle accesses of the more lateral collection, there was inability to aspirate free fluid. Guidewire advancement extended through the parenchyma and did not coil within the collection to allow placement of a second drain. Based on decreased in size as well as inability to accurately place a second drain in this collection, attempts at further new drainage catheter placement were aborted. This area appears to be resolving and will be followed while the patient receives appropriate antibiotic  therapy. IMPRESSION: 1. Successful exchange of the elongated multi side-hole drain placed initially with a standard 12 French pigtail drain formed in the larger residual medial right lobe hepatic abscess. This drain will be connected to suction bulb drainage and flushed 3 times daily. 2. The lateral hepatic abscess shows more significant size reduction after initial drainage. Attempts at placing a second drain in this collection were unsuccessful due to lack of liquefaction and inability to coiled a wire within the collection. This may be essentially resolved from a drainage perspective with some residual edema/fluid remaining. This collection will be followed as well as the remaining abscess containing a drain while the patient is receiving appropriate antibiotic therapy. CT resolution of hepatic collections with drainage can take weeks to months and once the patient is able to be discharged from the hospital, abscess drainage catheter management and status of collections can be followed in the IR drain Clinic. Electronically Signed   By: Aletta Edouard M.D.   On: 07/01/2016 09:01    Labs:  CBC:  Recent Labs  06/27/16 0325 06/28/16 0311 06/29/16 0343 06/30/16 0342  WBC 38.1* 40.7* 34.2* 31.4*  HGB 6.7* 7.9* 7.8* 7.6*  HCT 19.3* 21.5* 21.7* 21.4*  PLT 680* 651* 694* 696*    COAGS:  Recent Labs  06/20/16 0231 06/20/16 1330  INR 1.31 1.22    BMP:  Recent Labs  06/28/16 0311 06/29/16 0343 06/30/16 0342 07/01/16 0401  NA 130* 131* 130* 131*  K 4.3 4.3 3.8 4.5  CL 94* 97* 95* 96*  CO2 _0 GLUCOSE 102* 95 106* 95  BUN _1 CALCIUM 7.9* 7.8* 7.7* 7.8*  CREATININE 0.49 0.57 0.58 0.47  GFRNONAA >60 >60 >60 >60  GFRAA >60 >60 >60 >60    LIVER FUNCTION TESTS:  Recent Labs  06/28/16 0311 06/29/16 0343 06/30/16 0342 07/01/16 0401  BILITOT 3.9* 3.5* 4.6* 4.3*  AST 44* 31 28 50*  ALT 60* 46 41 51  ALKPHOS 202* 185* 176* 176*  PROT 4.7* 5.2* 5.4* 5.5*    ALBUMIN 2.1* 2.3* 2.4* 2.4*    Assessment and Plan: Hepatic abscess s/p drain exchange. Monitor ouptut. Anemia/leukocytosis Possible lymphoproliferative disorder For CT guided BM biopsy tomorrow am. Risks and Benefits discussed with the patient including, but not limited to bleeding, infection, damage to adjacent structures or low yield requiring additional tests. All of the patient's questions were answered, patient is agreeable to proceed. Consent signed and in chart.    Electronically Signed: Ascencion Dike 07/01/2016, 11:32 AM   I spent a total of 25 Minutes at the the patient's bedside AND on the patient's hospital floor or unit, greater than 50% of which was counseling/coordinating care for hepatic abscess drain and bone marrow biopsy

## 2016-07-02 ENCOUNTER — Inpatient Hospital Stay (HOSPITAL_COMMUNITY): Payer: Medicare Other

## 2016-07-02 ENCOUNTER — Encounter (HOSPITAL_COMMUNITY): Payer: Self-pay | Admitting: Radiology

## 2016-07-02 DIAGNOSIS — D649 Anemia, unspecified: Secondary | ICD-10-CM | POA: Insufficient documentation

## 2016-07-02 LAB — CBC
HEMATOCRIT: 18.8 % — AB (ref 36.0–46.0)
HEMOGLOBIN: 6.6 g/dL — AB (ref 12.0–15.0)
MCH: 33.2 pg (ref 26.0–34.0)
MCHC: 35.1 g/dL (ref 30.0–36.0)
MCV: 94.5 fL (ref 78.0–100.0)
Platelets: 746 10*3/uL — ABNORMAL HIGH (ref 150–400)
RBC: 1.99 MIL/uL — AB (ref 3.87–5.11)
RDW: 18.5 % — ABNORMAL HIGH (ref 11.5–15.5)
WBC: 23.1 10*3/uL — ABNORMAL HIGH (ref 4.0–10.5)

## 2016-07-02 LAB — COMPREHENSIVE METABOLIC PANEL
ALBUMIN: 2.4 g/dL — AB (ref 3.5–5.0)
ALT: 42 U/L (ref 14–54)
ANION GAP: 7 (ref 5–15)
AST: 29 U/L (ref 15–41)
Alkaline Phosphatase: 173 U/L — ABNORMAL HIGH (ref 38–126)
BUN: 14 mg/dL (ref 6–20)
CO2: 29 mmol/L (ref 22–32)
Calcium: 7.8 mg/dL — ABNORMAL LOW (ref 8.9–10.3)
Chloride: 97 mmol/L — ABNORMAL LOW (ref 101–111)
Creatinine, Ser: 0.44 mg/dL (ref 0.44–1.00)
GFR calc Af Amer: >60 mL/min (ref >60)
GFR calc non Af Amer: >60 mL/min (ref >60)
GLUCOSE: 103 mg/dL — AB (ref 65–99)
POTASSIUM: 3.5 mmol/L (ref 3.5–5.1)
SODIUM: 133 mmol/L — AB (ref 135–145)
TOTAL PROTEIN: 5.5 g/dL — AB (ref 6.5–8.1)
Total Bilirubin: 3.6 mg/dL — ABNORMAL HIGH (ref 0.3–1.2)

## 2016-07-02 LAB — BONE MARROW EXAM

## 2016-07-02 LAB — PREPARE RBC (CROSSMATCH)

## 2016-07-02 MED ORDER — SODIUM CHLORIDE 0.9 % IV SOLN: Freq: Once | INTRAVENOUS | Status: DC

## 2016-07-02 MED ORDER — MIDAZOLAM HCL 2 MG/2ML IJ SOLN
INTRAMUSCULAR | Status: AC | PRN
  Administered 2016-07-02: 0.5 mg via INTRAVENOUS
  Administered 2016-07-02: 1 mg via INTRAVENOUS

## 2016-07-02 MED ORDER — FENTANYL CITRATE (PF) 100 MCG/2ML IJ SOLN
INTRAMUSCULAR | Status: AC | PRN
  Administered 2016-07-02: 50 ug via INTRAVENOUS

## 2016-07-02 MED ORDER — FENTANYL CITRATE (PF) 100 MCG/2ML IJ SOLN
INTRAMUSCULAR | Status: AC
  Filled 2016-07-02: qty 4

## 2016-07-02 MED ORDER — ONDANSETRON HCL 4 MG/2ML IJ SOLN
INTRAMUSCULAR | Status: AC
  Filled 2016-07-02: qty 2

## 2016-07-02 MED ORDER — ONDANSETRON HCL 4 MG/2ML IJ SOLN
INTRAMUSCULAR | Status: AC | PRN
  Administered 2016-07-02: 4 mg via INTRAVENOUS

## 2016-07-02 MED ORDER — MIDAZOLAM HCL 2 MG/2ML IJ SOLN
INTRAMUSCULAR | Status: AC
  Filled 2016-07-02: qty 6

## 2016-07-02 NOTE — Progress Notes (Signed)
CRITICAL VALUE ALERT  Critical value received:  Hgb 6.6  Date of notification:  07/02/16  Time of notification:  [id number]  Critical value read back: yes  Nurse who received alert: Tommie Raymond  MD notified (1st page):  [place]  Time of first page:  0441  MD notified (2nd page):  Time of second page:  Responding MD:  Rachelle Hora  Time MD responded:  [phone]

## 2016-07-02 NOTE — Progress Notes (Signed)
INFECTIOUS DISEASE PROGRESS NOTE  ID: Brianna [name] is a 82 y.o. female with  Principal Problem:   Hepatic abscess Active Problems:   Spinal stenosis of lumbar region   Benign essential HTN   Sepsis (HCC)   Hyponatremia   Hyperkalemia   Jaundice   Elevated LFTs   Malnutrition of moderate degree   Hypoxia   Liver abscess   Hemolytic anemia (HCC)   Cold agglutinin disease (HCC)   IgM monoclonal gammopathy of uncertain significance   Leukocytosis  Subjective: Without complaints  Abtx:  Anti-infectives    Start     Dose/Rate Route Frequency Ordered Stop   06/30/16 1700  Ampicillin-Sulbactam (UNASYN) 3 g in sodium chloride 0.9 % 100 mL IVPB     3 g 100 mL/hr over 60 Minutes Intravenous Every 8 hours 06/30/16 1618     06/27/16 2300  piperacillin-tazobactam (ZOSYN) IVPB 3.375 g  Status:  Discontinued     3.375 g 12.5 mL/hr over 240 Minutes Intravenous Every 8 hours 06/27/16 1615 06/30/16 1618   06/27/16 1700  piperacillin-tazobactam (ZOSYN) IVPB 3.375 g     3.375 g 100 mL/hr over 30 Minutes Intravenous  Once 06/27/16 1615 06/27/16 1746   06/27/16 1200  fluconazole (DIFLUCAN) tablet 100 mg     100 mg Oral Daily 06/27/16 1111     06/25/16 0930  fluconazole (DIFLUCAN) IVPB 100 mg  Status:  Discontinued     100 mg 50 mL/hr over 60 Minutes Intravenous Every 24 hours 06/25/16 0841 06/27/16 1110   06/20/16 1400  vancomycin (VANCOCIN) 500 mg in sodium chloride 0.9 % 100 mL IVPB  Status:  Discontinued     500 mg 100 mL/hr over 60 Minutes Intravenous Every 24 hours 06/19/16 1254 06/23/16 1550   06/19/16 2200  piperacillin-tazobactam (ZOSYN) IVPB 3.375 g  Status:  Discontinued     3.375 g 12.5 mL/hr over 240 Minutes Intravenous Every 8 hours 06/19/16 1254 06/27/16 1615   06/19/16 1245  piperacillin-tazobactam (ZOSYN) IVPB 3.375 g     3.375 g 100 mL/hr over 30 Minutes Intravenous  Once 06/19/16 1242 06/19/16 1335   06/19/16 1245  vancomycin (VANCOCIN) IVPB 1000 mg/200 mL premix      1,000 mg 200 mL/hr over 60 Minutes Intravenous  Once 06/19/16 1242 06/19/16 1456      Medications:  Scheduled: . sodium chloride   Intravenous Once  . ampicillin-sulbactam (UNASYN) IV  3 g Intravenous Q8H  . antiseptic oral rinse  7 mL Mouth Rinse BID  . conjugated estrogens  1 Applicatorful Vaginal Once per day on Mon Wed Fri  . diclofenac sodium  2 g Topical QID  . fluconazole  100 mg Oral Daily  . folic acid  1 mg Oral Daily  . metoprolol succinate  25 mg Oral Daily  . multivitamin with minerals  1 tablet Oral Daily  . nutrition supplement (JUVEN)  2 packet Oral BID BM  . polyethylene glycol  17 g Oral BID  . ranitidine  150 mg Oral BID  . simethicone  80 mg Oral QID  . sodium chloride flush  3 mL Intravenous Q12H  . sodium chloride  1 g Oral BID WC    Objective: Vital signs in last 24 hours: Temp:  [98 F (36.7 C)-98.4 F (36.9 C)] 98.2 F (36.8 C) (07/21 1318) Pulse Rate:  [93-108] 104 (07/21 1318) Resp:  [16-24] 16 (07/21 1318) BP: (126-151)/(58-86) 141/74 mmHg (07/21 1318) SpO2:  [92 %-100 %] 99 % (07/21 1045) Weight:  [  61.236 kg (135 lb)] 61.236 kg (135 lb) (07/21 0654)   General appearance: alert, cooperative, icteric and no distress Resp: clear to auscultation bilaterally Cardio: regular rate and rhythm GI: normal findings: bowel sounds normal and soft, non-tender  Lab Results  Recent Labs  06/30/16 0342 07/01/16 0401 07/02/16 0350  WBC 31.4*  --  23.1*  HGB 7.6*  --  6.6*  HCT 21.4*  --  18.8*  NA 130* 131* 133*  K 3.8 4.5 3.5  CL 95* 96* 97*  CO2 28 28 29   BUN 16 16 14   CREATININE 0.58 0.47 0.44   Liver Panel  Recent Labs  07/01/16 0401 07/02/16 0350  PROT 5.5* 5.5*  ALBUMIN 2.4* 2.4*  AST 50* 29  ALT 51 42  ALKPHOS 176* 173*  BILITOT 4.3* 3.6*   Sedimentation Rate No results for input(s): ESRSEDRATE in the last 72 hours. C-Reactive Protein No results for input(s): CRP in the last 72 hours.  Microbiology: No results found  for this or any previous visit (from the past 240 hour(s)).  Studies/Results: Ct Abscess Cath Exchange  07/01/2016  INDICATION: Status post percutaneous drainage of 2 separate hepatic abscesses on 06/21/2016 with placement of an elongated drainage catheter across both collections. There has been worsening elevation of white blood cell count with CT on 06/25/2016 demonstrating slight reduction in a lateral right hepatic abscess and minimal change in a medial right lobe hepatic abscess. EXAM: CT ABSCESS CATH EXCHANGE MEDICATIONS: The patient is currently admitted to the hospital and receiving intravenous antibiotics. The antibiotics were administered within an appropriate time frame prior to the initiation of the procedure. ANESTHESIA/SEDATION: Fentanyl 50 mcg IV; Versed 3.0 mg IV Moderate Sedation Time:  62 minutes. The patient was continuously monitored during the procedure by the interventional radiology nurse under my direct supervision. COMPLICATIONS: None immediate. PROCEDURE: Informed written consent was obtained from the patient after a thorough discussion of the procedural risks, benefits and alternatives. All questions were addressed. Maximal Sterile Barrier Technique was utilized including caps, mask, sterile gowns, sterile gloves, sterile drape, hand hygiene and skin antiseptic. A timeout was performed prior to the initiation of the procedure. The pre-existing exiting hepatic drainage catheter and surrounding skin were prepped with chlorhexidine. During the procedure, 1% lidocaine was infiltrated to provide local anesthesia around the tube exit site. The current drainage catheter course was imaged by CT. The catheter was cut and removed over a guidewire. A 5 French diagnostic catheter was then advanced into the liver parenchyma. The catheter and guidewire were manipulated to gain wire access into the deeper, more medial hepatic abscess. A new 12 French drainage catheter was then advanced over the wire  and into the collection. The catheter positioning was confirmed by CT. The catheter was flushed with saline and connected to a suction bulb. It was secured at the skin with a Prolene retention suture and StatLock device. The rest of the procedure was spent trying to gain separate access into the lateral hepatic abscess. 18 gauge trocar needles were advanced into this collection and guidewires advanced as well as attempts at aspiration. Ultimately, attempt to place a second new hepatic abscess drainage catheter were aborted. FINDINGS: Based on unenhanced CT images obtained, the lateral right lobe hepatic abscess clearly is significantly smaller than on the imaging obtained at the time of initial drainage. Maximal diameter of this area of residual collections/edema has decreased from approximately 4.3 cm down to 2.6 cm. The medial abscess is also smaller than original size with  maximal diameter of approximately 4.1 cm compared to 4.7 cm prior to drainage. The pre-existing multi side-hole drainage catheter extends through both collections and terminates in the medial dome of the liver. With catheter exchange, there with success in placing a standard 12 French pigtail drainage catheter into the larger medial collection with return of some debris and bloody fluid. With multiple separate needle accesses of the more lateral collection, there was inability to aspirate free fluid. Guidewire advancement extended through the parenchyma and did not coil within the collection to allow placement of a second drain. Based on decreased in size as well as inability to accurately place a second drain in this collection, attempts at further new drainage catheter placement were aborted. This area appears to be resolving and will be followed while the patient receives appropriate antibiotic therapy. IMPRESSION: 1. Successful exchange of the elongated multi side-hole drain placed initially with a standard 12 French pigtail drain formed in  the larger residual medial right lobe hepatic abscess. This drain will be connected to suction bulb drainage and flushed 3 times daily. 2. The lateral hepatic abscess shows more significant size reduction after initial drainage. Attempts at placing a second drain in this collection were unsuccessful due to lack of liquefaction and inability to coiled a wire within the collection. This may be essentially resolved from a drainage perspective with some residual edema/fluid remaining. This collection will be followed as well as the remaining abscess containing a drain while the patient is receiving appropriate antibiotic therapy. CT resolution of hepatic collections with drainage can take weeks to months and once the patient is able to be discharged from the hospital, abscess drainage catheter management and status of collections can be followed in the IR drain Clinic. Electronically Signed   By: Aletta Edouard M.D.   On: 07/01/2016 09:01   Ct Biopsy  07/02/2016  INDICATION: 82 year old female with anemia and leukocytosis. EXAM: CT GUIDED BONE MARROW ASPIRATION AND CORE BIOPSY Interventional Radiologist:  Criselda Peaches, MD MEDICATIONS: None. ANESTHESIA/SEDATION: Moderate (conscious) sedation was employed during this procedure. A total of 1.5 milligrams versed and 100 micrograms fentanyl were administered intravenously. The patient's level of consciousness and vital signs were monitored continuously by radiology nursing throughout the procedure under my direct supervision. Total monitored sedation time: 8 minutes FLUOROSCOPY TIME:  None COMPLICATIONS: None immediate. Estimated blood loss: <25 mL PROCEDURE: Informed written consent was obtained from the patient after a thorough discussion of the procedural risks, benefits and alternatives. All questions were addressed. Maximal Sterile Barrier Technique was utilized including caps, mask, sterile gowns, sterile gloves, sterile drape, hand hygiene and skin  antiseptic. A timeout was performed prior to the initiation of the procedure. The patient was positioned prone and non-contrast localization CT was performed of the pelvis to demonstrate the iliac marrow spaces. Maximal barrier sterile technique utilized including caps, mask, sterile gowns, sterile gloves, large sterile drape, hand hygiene, and betadine prep. Under sterile conditions and local anesthesia, an 11 gauge coaxial bone biopsy needle was advanced into the left iliac marrow space. Needle position was confirmed with CT imaging. Initially, bone marrow aspiration was performed. Next, the 11 gauge outer cannula was utilized to obtain a left iliac bone marrow core biopsy. Needle was removed. Hemostasis was obtained with compression. The patient tolerated the procedure well. Samples were prepared with the cytotechnologist. IMPRESSION: Successful CT-guided bone marrow biopsy. Electronically Signed   By: [name] M.D.   On: 07/02/2016 11:54     Assessment/Plan: Liver abscesses  Her repeat CT scan (7-14) is unimproved. Drain exchanged 7-19 Her WBC continues to improve, she is afebrile.  Drain out 20 last 24h.  Cx- "anaerobe, beta-lactamase negative" is all the info the lab has. (fusobacterium> bacteroides). Will change her to unasyn. Consider augmentin at d/c. Length of therapy determined by f/u CT amoeba serologies negative Continue PT  Diverticulitis? Pt relates LLQ pain 2 weeks PTA  Urinary hesitancy  Fluid overload  Cold Agglutinin anemia appreciate heme f/u BM Bx done today, result pending  Protein Calorie Malnutrition Nutrition f/u Alb 2.4  Total days of antibiotics: 10 zosyn --> 2 unasyn Available as needed over weekend         Bobby Rumpf Infectious Diseases (pager) [phone] [email or web address] 07/02/2016, 3:34 PM  LOS: 13 days

## 2016-07-02 NOTE — Progress Notes (Signed)
Physical Therapy Treatment Patient Details Name: Brianna [name] MRN: [id number] DOB: November 06, 1924 Today's Date: 07/02/2016    History of Present Illness 82 y/o female admitted 7/8 with sepsis with jaundice due to hepatic abscess (s/p perc drain on 7/10, upsized on 7/19).  underwent bone marrow biopsy 7/21.  PMH includes spinal stenosis, thoracic vertebral fx    PT Comments    Pt receiving blood however agreeable to attempt mobility in room.  Pt unable to tolerate much ambulation distance (about 3 feet forward and backward from recliner) and also with blood warmer which required plug in so limited distance.  Pt presents with limited endurance and generalized weakness and would benefit from ST-SNF upon d/c.  Pt and daughter seem very agreeable to this plan.   Follow Up Recommendations  SNF;Supervision/Assistance - 24 hour     Equipment Recommendations  None recommended by PT    Recommendations for Other Services       Precautions / Restrictions Precautions Precautions: Fall Precaution Comments: R perc drain    Mobility  Bed Mobility               General bed mobility comments: pt up in recliner on arrival  Transfers Overall transfer level: Needs assistance Equipment used: Rolling walker (2 wheeled) Transfers: Sit to/from Stand Sit to Stand: Min assist         General transfer comment: assist to rise, steady and control descent, verbal cues for hand placement  Ambulation/Gait Ambulation/Gait assistance: Min assist Ambulation Distance (Feet): 3 Feet Assistive device: Rolling walker (2 wheeled) Gait Pattern/deviations: Step-through pattern;Decreased stride length;Trunk flexed;Narrow base of support;Shuffle     General Gait Details: pt with very small short steps, only able to tolerate 3 feet forward and then backwards to recliner, cues for bigger steps (which pt was unable to perform) and RW positioning, assist for steady, pt kept knees in flexion throughout and  reports increased weakness   Stairs            Wheelchair Mobility    Modified Rankin (Stroke Patients Only)       Balance                                    Cognition Arousal/Alertness: Awake/alert Behavior During Therapy: WFL for tasks assessed/performed Overall Cognitive Status: Within Functional Limits for tasks assessed                      Exercises      General Comments        Pertinent Vitals/Pain Pain Assessment: No/denies pain    Home Living                      Prior Function            PT Goals (current goals can now be found in the care plan section) Progress towards PT goals: Progressing toward goals    Frequency  Min 3X/week    PT Plan Discharge plan needs to be updated    Co-evaluation             End of Session Equipment Utilized During Treatment: Gait belt Activity Tolerance: Patient limited by fatigue Patient left: in chair;with call bell/phone within reach;with chair alarm set;with family/visitor present     Time: 1520-1537 PT Time Calculation (min) (ACUTE ONLY): 17 min  Charges:  $Gait Training: 8-22 mins  G Codes:      LEMYRE,KATHrine E 07/02/2016, 3:56 PM Kati Lemyre, PT, DPT 07/02/2016 Pager: [phone]   

## 2016-07-02 NOTE — Progress Notes (Signed)
[facility] Progress Note   Brianna [name]  DDU:[id number]  DOB: 09/18/1924  DOA: 06/19/2016 PCP: Tawanna Solo, MD  Brief narrative:  Brianna [name] is a 82 y.o. female with HTN, cold agglutinin disease presenting with temp of 103, jaundice and dark urine and weight loss of about 10 lbs. She had been feeling bad since 7/5 and was seen in the ER for syncope and discharged home. She returned to there ER after she was noted to be jaundiced. She was found to have elevated LFTS, WBC count of 40 and a CT scan showing right lobe hepatic abscesses. She also had a sodium of 125 and potassium of 5.7. Tylenol level was normal.   She is being followed by GI, oncology, ID and IR, she is on IV antibiotics per ID and has a right upper quadrant drain placed by IR. She also has issues with hemolytic anemia, she's been requiring transfusions, oncology is assisting in management of this problem.  Bone Marrow Biopsy was done by IR on 7/21.    Due to her liver abscess and poor appetite she has severe protein calorie malnutrition with third spacing of fluids which is being removed with gentle diuresis.  Pt to return to have new larger drain placed by IR 7/19.   Subjective: Pt tolerated bone marrow biopsy today. She has not been able to ambulate.  She reports being very weak and needs blood transfusion today.     Assessment/Plan: Principal Problem:  Hepatic abscess causing sepsis Sepsis,  Acute liver failure with Elevated LFTs & jaundice - ID, GI and IR following. Sepsis resolved now.  She is currently on unasyn under the guidance of ID, right upper quadrant drain placed by IR on 06/21/2016, cultures so far are negative. LFTs and total bilirubin have marginally improved, question if she has had a past gallstone due to her hemolytic anemia causing disruption and bowel integrity and liver abscess. Will defer duration and route of total IV antibiotics to ID.  Pt s/p new larger drain 7/19 which is draining  more.  WBC is trending down.     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 Oncology and following and assisting. There is question of underlying proliferative disorder. Hemoglobin down to 6.6.  Transfuse 2 units 7/21.  Recheck CBC in AM.    Leukocytosis - trending down. mostly neutrophils- suspecting possibly underlying proliferative disorder in addition to current infection, Heme/Onc ordered bone marrow biopsy done 7/21.  Pathology pending.   Difficulty with urination - stable post-void residual, urinating regularly.  Following.   Right mid back pain - Voltaren gel, pain is better.  Hyponatremia/ pedal edema - sodium stable. Following.  gentle diuresis with Lasix and monitor.  Hypoalbuminemia/ Moderate protein calorie malnutrition/ anasarca  - due to abscesses and poor appetite, continue protein supplementation, appetite improving, continue gentle diuresis as permitted by blood pressure and renal function. Dose Lasix daily.  Hypoxia on exertion -  pulm edema due to third spacing and fluid overload +  atelectasis due to being bed bound she was started on PO Lasix + IS, continue to gently diurese and monitor. Shortness of breath has considerably improved.   Abdominal pain -   Persists but improving.     Benign essential HTN - stable on  Metoprolol  Antibiotics: Anti-infectives    Start  Dose/Rate Route Frequency Ordered Stop   06/30/16 1700  Ampicillin-Sulbactam (UNASYN) 3 g in sodium chloride 0.9 % 100 mL IVPB     3 g 100 mL/hr over 60 Minutes Intravenous Every 8 hours 06/30/16 1618     06/27/16 2300  piperacillin-tazobactam (ZOSYN) IVPB 3.375 g  Status:  Discontinued     3.375 g 12.5 mL/hr over 240 Minutes Intravenous Every 8 hours 06/27/16 1615 06/30/16 1618   06/27/16 1700  piperacillin-tazobactam (ZOSYN) IVPB 3.375 g      3.375 g 100 mL/hr over 30 Minutes Intravenous  Once 06/27/16 1615 06/27/16 1746   06/27/16 1200  fluconazole (DIFLUCAN) tablet 100 mg     100 mg Oral Daily 06/27/16 1111     06/25/16 0930  fluconazole (DIFLUCAN) IVPB 100 mg  Status:  Discontinued     100 mg 50 mL/hr over 60 Minutes Intravenous Every 24 hours 06/25/16 0841 06/27/16 1110   06/20/16 1400  vancomycin (VANCOCIN) 500 mg in sodium chloride 0.9 % 100 mL IVPB  Status:  Discontinued     500 mg 100 mL/hr over 60 Minutes Intravenous Every 24 hours 06/19/16 1254 06/23/16 1550   06/19/16 2200  piperacillin-tazobactam (ZOSYN) IVPB 3.375 g  Status:  Discontinued     3.375 g 12.5 mL/hr over 240 Minutes Intravenous Every 8 hours 06/19/16 1254 06/27/16 1615   06/19/16 1245  piperacillin-tazobactam (ZOSYN) IVPB 3.375 g     3.375 g 100 mL/hr over 30 Minutes Intravenous  Once 06/19/16 1242 06/19/16 1335   06/19/16 1245  vancomycin (VANCOCIN) IVPB 1000 mg/200 mL premix     1,000 mg 200 mL/hr over 60 Minutes Intravenous  Once 06/19/16 1242 06/19/16 1456     Code Status: DNR Family Communication: daughter bedside Disposition Plan: to be determined- cont to follow in hospital until ID and hematology decide when she would be safe to discharge DVT prophylaxis: SCDS Consultants: hematology, GI, ID Procedures: RUQ drain  Objective: Filed Weights   06/30/16 0553 07/01/16 0502 07/02/16 0654  Weight: 62.097 kg (136 lb 14.4 oz) 59.92 kg (132 lb 1.6 oz) 61.236 kg (135 lb)    Intake/Output Summary (Last 24 hours) at 07/02/16 0934 Last data filed at 07/02/16 0406  Gross per 24 hour  Intake     10 ml  Output   1220 ml  Net  -1210 ml     Vitals Filed Vitals:   07/02/16 0654 07/02/16 0818 07/02/16 0835 07/02/16 0839  BP: 134/64 145/86 151/65 135/58  Pulse: 97 108 106 105  Temp: 98.3 F (36.8 C)     TempSrc: Oral     Resp: _0 Height:      Weight: 61.236 kg (135 lb)     SpO2: 94% 100% 99% 98%    Exam:  General:  Pt is  alert, not in acute distress, pt appears jaundiced.  HEENT: mild scleral icterus, No thrush, oral mucosa moist  Cardiovascular: regular rate and rhythm, S1/S2 No murmur  Respiratory: clear to auscultation bilaterally   Abdomen: Soft, +Bowel sounds, less distended, tenderness RUQ/RLQ with light palpation, - drain in place  MSK: No cyanosis or clubbing- 1+ pitting pedal edema   Data Reviewed: Basic Metabolic Panel:  Recent Labs Lab 06/28/16 0311 06/29/16 0343 06/30/16 0342 07/01/16 0401 07/02/16 0350  NA 130* 131* 130* 131* 133*  K 4.3 4.3 3.8 4.5 3.5  CL 94* 97* 95* 96* 97*  CO2 _1 GLUCOSE 102* 95 106* 95 103*  BUN _0 CREATININE 0.49 0.57 0.58 0.47 0.44  CALCIUM 7.9* 7.8* 7.7* 7.8* 7.8*  MG  --   --  2.2  --   --    Liver Function Tests:  Recent Labs Lab 06/28/16 0311 06/29/16 0343 06/30/16 0342 07/01/16 0401 07/02/16 0350  AST 44* 31 28 50* 29  ALT 60* 46 41 51 42  ALKPHOS 202* 185* 176* 176* 173*  BILITOT 3.9* 3.5* 4.6* 4.3* 3.6*  PROT 4.7* 5.2* 5.4* 5.5* 5.5*  ALBUMIN 2.1* 2.3* 2.4* 2.4* 2.4*   No results for input(s): LIPASE, AMYLASE in the last 168 hours. No results for input(s): AMMONIA in the last 168 hours. CBC:  Recent Labs Lab 06/27/16 0325 06/28/16 0311 06/29/16 0343 06/30/16 0342 07/02/16 0350  WBC 38.1* 40.7* 34.2* 31.4* 23.1*  NEUTROABS 30.0*  --   --   --   --   HGB 6.7* 7.9* 7.8* 7.6* 6.6*  HCT 19.3* 21.5* 21.7* 21.4* 18.8*  MCV 89.8 91.1 93.1 93.4 94.5  PLT 680* 651* 694* 696* 746*   Cardiac Enzymes: No results for input(s): CKTOTAL, CKMB, CKMBINDEX, TROPONINI in the last 168 hours. BNP (last 3 results) No results for input(s): BNP in the last 8760 hours.  ProBNP (last 3 results) No results for input(s): PROBNP in the last 8760 hours.  CBG:  Recent Labs Lab 06/28/16 1201  GLUCAP 145*    No results found for this or any previous visit (from the past 240 hour(s)).   Studies: Ct Abscess Cath  Exchange  07/01/2016  INDICATION: Status post percutaneous drainage of 2 separate hepatic abscesses on 06/21/2016 with placement of an elongated drainage catheter across both collections. There has been worsening elevation of white blood cell count with CT on 06/25/2016 demonstrating slight reduction in a lateral right hepatic abscess and minimal change in a medial right lobe hepatic abscess. EXAM: CT ABSCESS CATH EXCHANGE MEDICATIONS: The patient is currently admitted to the hospital and receiving intravenous antibiotics. The antibiotics were administered within an appropriate time frame prior to the initiation of the procedure. ANESTHESIA/SEDATION: Fentanyl 50 mcg IV; Versed 3.0 mg IV Moderate Sedation Time:  62 minutes. The patient was continuously monitored during the procedure by the interventional radiology nurse under my direct supervision. COMPLICATIONS: None immediate. PROCEDURE: Informed written consent was obtained from the patient after a thorough discussion of the procedural risks, benefits and alternatives. All questions were addressed. Maximal Sterile Barrier Technique was utilized including caps, mask, sterile gowns, sterile gloves, sterile drape, hand hygiene and skin antiseptic. A timeout was performed prior to the initiation of the procedure. The pre-existing exiting hepatic drainage catheter and surrounding skin were prepped with chlorhexidine. During the procedure, 1% lidocaine was infiltrated to provide local anesthesia around the tube exit site. The current drainage catheter course was imaged by CT. The catheter was cut and removed over a guidewire. A 5 French diagnostic catheter was then advanced into the liver parenchyma. The catheter and guidewire were manipulated to gain wire access into the deeper, more medial hepatic abscess. A new 12 French drainage catheter was then advanced over the wire and into the collection. The catheter positioning was confirmed by CT. The catheter was flushed  with saline and connected to a suction bulb. It was secured at the skin with a Prolene retention suture and StatLock device. The rest of the procedure was spent trying to gain separate access into the lateral hepatic abscess. 18 gauge trocar needles were advanced into this collection  and guidewires advanced as well as attempts at aspiration. Ultimately, attempt to place a second new hepatic abscess drainage catheter were aborted. FINDINGS: Based on unenhanced CT images obtained, the lateral right lobe hepatic abscess clearly is significantly smaller than on the imaging obtained at the time of initial drainage. Maximal diameter of this area of residual collections/edema has decreased from approximately 4.3 cm down to 2.6 cm. The medial abscess is also smaller than original size with maximal diameter of approximately 4.1 cm compared to 4.7 cm prior to drainage. The pre-existing multi side-hole drainage catheter extends through both collections and terminates in the medial dome of the liver. With catheter exchange, there with success in placing a standard 12 French pigtail drainage catheter into the larger medial collection with return of some debris and bloody fluid. With multiple separate needle accesses of the more lateral collection, there was inability to aspirate free fluid. Guidewire advancement extended through the parenchyma and did not coil within the collection to allow placement of a second drain. Based on decreased in size as well as inability to accurately place a second drain in this collection, attempts at further new drainage catheter placement were aborted. This area appears to be resolving and will be followed while the patient receives appropriate antibiotic therapy. IMPRESSION: 1. Successful exchange of the elongated multi side-hole drain placed initially with a standard 12 French pigtail drain formed in the larger residual medial right lobe hepatic abscess. This drain will be connected to suction  bulb drainage and flushed 3 times daily. 2. The lateral hepatic abscess shows more significant size reduction after initial drainage. Attempts at placing a second drain in this collection were unsuccessful due to lack of liquefaction and inability to coiled a wire within the collection. This may be essentially resolved from a drainage perspective with some residual edema/fluid remaining. This collection will be followed as well as the remaining abscess containing a drain while the patient is receiving appropriate antibiotic therapy. CT resolution of hepatic collections with drainage can take weeks to months and once the patient is able to be discharged from the hospital, abscess drainage catheter management and status of collections can be followed in the IR drain Clinic. Electronically Signed   By: Aletta Edouard M.D.   On: 07/01/2016 09:01    Scheduled Meds:  Scheduled Meds: . sodium chloride   Intravenous Once  . ampicillin-sulbactam (UNASYN) IV  3 g Intravenous Q8H  . antiseptic oral rinse  7 mL Mouth Rinse BID  . conjugated estrogens  1 Applicatorful Vaginal Once per day on Mon Wed Fri  . diclofenac sodium  2 g Topical QID  . fluconazole  100 mg Oral Daily  . folic acid  1 mg Oral Daily  . metoprolol succinate  25 mg Oral Daily  . multivitamin with minerals  1 tablet Oral Daily  . nutrition supplement (JUVEN)  2 packet Oral BID BM  . polyethylene glycol  17 g Oral BID  . ranitidine  150 mg Oral BID  . simethicone  80 mg Oral QID  . sodium chloride flush  3 mL Intravenous Q12H  . sodium chloride  1 g Oral BID WC   Continuous Infusions:    Time spent on care of this patient: 22 min  Signature  Kaila Devries M.D on 07/02/2016 at 9:34 AM  Between 7am to 7pm - Pager - [phone], After 7pm go to [email or web address] - password [email or web address]  [facility]  - Office  [phone]

## 2016-07-02 NOTE — Progress Notes (Signed)
Patient ID: Brianna [name], female   DOB: [birth year], 82 y.o.   MRN: [id number]    Referring Physician(s): Dr. [name]  Supervising Physician: [name]  Patient Status: inpt  Chief Complaint: Hepatic abscess  Subjective: Patient doesn't feel well.  Still having a lot of back pain.  No abdominal pain or pain from her drain  Allergies: Other; Compazine; Erythromycin; Lyrica; Promethazine; Tramadol; and Vistaril  Medications: Prior to Admission medications   Medication Sig Start Date End Date Taking? Authorizing Provider  acetaminophen (TYLENOL) 500 MG tablet Take 1,000 mg by mouth every 6 (six) hours as needed for mild pain, moderate pain, fever or headache.   Yes Historical Provider, MD  conjugated estrogens (PREMARIN) vaginal cream Place 1 Applicatorful vaginally 3 (three) times a week.   Yes Historical Provider, MD  diphenhydrAMINE (BENADRYL) 25 MG tablet Take 25 mg by mouth at bedtime as needed for sleep.    Yes Historical Provider, MD  famotidine (PEPCID) 10 MG tablet Take 10 mg by mouth daily as needed for heartburn or indigestion.   Yes Historical Provider, MD  ibuprofen (ADVIL,MOTRIN) 200 MG tablet Take 400 mg by mouth every 6 (six) hours as needed for fever, headache, mild pain, moderate pain or cramping.   Yes Historical Provider, MD  Melatonin 5 MG TABS Take 5 mg by mouth at bedtime as needed (sleep).   Yes Historical Provider, MD  metoprolol succinate (TOPROL-XL) 25 MG 24 hr tablet Take 1 tablet (25 mg total) by mouth daily. 12/13/13  Yes Robbie Lis, MD  Multiple Vitamin (MULTIVITAMIN WITH MINERALS) TABS tablet Take 1 tablet by mouth daily.   Yes Historical Provider, MD  ondansetron (ZOFRAN) 8 MG tablet Take 8 mg by mouth every 8 (eight) hours as needed for nausea or vomiting.   Yes Historical Provider, MD  Polyethyl Glycol-Propyl Glycol (SYSTANE OP) Apply 2 drops to eye 3 (three) times daily as needed (dry eyes).   Yes Historical Provider, MD  ibuprofen  (ADVIL,MOTRIN) 800 MG tablet Take 0.5 tablets (400 mg total) by mouth 3 (three) times daily. Patient not taking: Reported on 06/19/2016 06/16/16   [name], MD    Vital Signs: BP 127/58 mmHg  Pulse 99  Temp(Src) 98 F (36.7 C) (Oral)  Resp 16  Ht 5' 3.5" (1.613 m)  Wt 135 lb (61.236 kg)  BMI 23.54 kg/m2  SpO2 92%  Physical Exam: Abd: soft, drain with 20cc or serosang output in last 24hrs.  Drain site is c/d/i.  Imaging: Ct Abscess Cath Exchange  07/01/2016  INDICATION: Status post percutaneous drainage of 2 separate hepatic abscesses on 06/21/2016 with placement of an elongated drainage catheter across both collections. There has been worsening elevation of white blood cell count with CT on 06/25/2016 demonstrating slight reduction in a lateral right hepatic abscess and minimal change in a medial right lobe hepatic abscess. EXAM: CT ABSCESS CATH EXCHANGE MEDICATIONS: The patient is currently admitted to the hospital and receiving intravenous antibiotics. The antibiotics were administered within an appropriate time frame prior to the initiation of the procedure. ANESTHESIA/SEDATION: Fentanyl 50 mcg IV; Versed 3.0 mg IV Moderate Sedation Time:  62 minutes. The patient was continuously monitored during the procedure by the interventional radiology nurse under my direct supervision. COMPLICATIONS: None immediate. PROCEDURE: Informed written consent was obtained from the patient after a thorough discussion of the procedural risks, benefits and alternatives. All questions were addressed. Maximal Sterile Barrier Technique was utilized including caps, mask, sterile gowns, sterile gloves, sterile drape, hand  hygiene and skin antiseptic. A timeout was performed prior to the initiation of the procedure. The pre-existing exiting hepatic drainage catheter and surrounding skin were prepped with chlorhexidine. During the procedure, 1% lidocaine was infiltrated to provide local anesthesia around the tube exit  site. The current drainage catheter course was imaged by CT. The catheter was cut and removed over a guidewire. A 5 French diagnostic catheter was then advanced into the liver parenchyma. The catheter and guidewire were manipulated to gain wire access into the deeper, more medial hepatic abscess. A new 12 French drainage catheter was then advanced over the wire and into the collection. The catheter positioning was confirmed by CT. The catheter was flushed with saline and connected to a suction bulb. It was secured at the skin with a Prolene retention suture and StatLock device. The rest of the procedure was spent trying to gain separate access into the lateral hepatic abscess. 18 gauge trocar needles were advanced into this collection and guidewires advanced as well as attempts at aspiration. Ultimately, attempt to place a second new hepatic abscess drainage catheter were aborted. FINDINGS: Based on unenhanced CT images obtained, the lateral right lobe hepatic abscess clearly is significantly smaller than on the imaging obtained at the time of initial drainage. Maximal diameter of this area of residual collections/edema has decreased from approximately 4.3 cm down to 2.6 cm. The medial abscess is also smaller than original size with maximal diameter of approximately 4.1 cm compared to 4.7 cm prior to drainage. The pre-existing multi side-hole drainage catheter extends through both collections and terminates in the medial dome of the liver. With catheter exchange, there with success in placing a standard 12 French pigtail drainage catheter into the larger medial collection with return of some debris and bloody fluid. With multiple separate needle accesses of the more lateral collection, there was inability to aspirate free fluid. Guidewire advancement extended through the parenchyma and did not coil within the collection to allow placement of a second drain. Based on decreased in size as well as inability to  accurately place a second drain in this collection, attempts at further new drainage catheter placement were aborted. This area appears to be resolving and will be followed while the patient receives appropriate antibiotic therapy. IMPRESSION: 1. Successful exchange of the elongated multi side-hole drain placed initially with a standard 12 French pigtail drain formed in the larger residual medial right lobe hepatic abscess. This drain will be connected to suction bulb drainage and flushed 3 times daily. 2. The lateral hepatic abscess shows more significant size reduction after initial drainage. Attempts at placing a second drain in this collection were unsuccessful due to lack of liquefaction and inability to coiled a wire within the collection. This may be essentially resolved from a drainage perspective with some residual edema/fluid remaining. This collection will be followed as well as the remaining abscess containing a drain while the patient is receiving appropriate antibiotic therapy. CT resolution of hepatic collections with drainage can take weeks to months and once the patient is able to be discharged from the hospital, abscess drainage catheter management and status of collections can be followed in the IR drain Clinic. Electronically Signed   By: Aletta Edouard M.D.   On: 07/01/2016 09:01   Ct Biopsy  07/02/2016  INDICATION: 82 year old female with anemia and leukocytosis. EXAM: CT GUIDED BONE MARROW ASPIRATION AND CORE BIOPSY Interventional Radiologist:  Criselda Peaches, MD MEDICATIONS: None. ANESTHESIA/SEDATION: Moderate (conscious) sedation was employed during this procedure. A  total of 1.5 milligrams versed and 100 micrograms fentanyl were administered intravenously. The patient's level of consciousness and vital signs were monitored continuously by radiology nursing throughout the procedure under my direct supervision. Total monitored sedation time: 8 minutes FLUOROSCOPY TIME:  None  COMPLICATIONS: None immediate. Estimated blood loss: <25 mL PROCEDURE: Informed written consent was obtained from the patient after a thorough discussion of the procedural risks, benefits and alternatives. All questions were addressed. Maximal Sterile Barrier Technique was utilized including caps, mask, sterile gowns, sterile gloves, sterile drape, hand hygiene and skin antiseptic. A timeout was performed prior to the initiation of the procedure. The patient was positioned prone and non-contrast localization CT was performed of the pelvis to demonstrate the iliac marrow spaces. Maximal barrier sterile technique utilized including caps, mask, sterile gowns, sterile gloves, large sterile drape, hand hygiene, and betadine prep. Under sterile conditions and local anesthesia, an 11 gauge coaxial bone biopsy needle was advanced into the left iliac marrow space. Needle position was confirmed with CT imaging. Initially, bone marrow aspiration was performed. Next, the 11 gauge outer cannula was utilized to obtain a left iliac bone marrow core biopsy. Needle was removed. Hemostasis was obtained with compression. The patient tolerated the procedure well. Samples were prepared with the cytotechnologist. IMPRESSION: Successful CT-guided bone marrow biopsy. Electronically Signed   By: [name] M.D.   On: 07/02/2016 11:54    Labs:  CBC:  Recent Labs  06/28/16 0311 06/29/16 0343 06/30/16 0342 07/02/16 0350  WBC 40.7* 34.2* 31.4* 23.1*  HGB 7.9* 7.8* 7.6* 6.6*  HCT 21.5* 21.7* 21.4* 18.8*  PLT 651* 694* 696* 746*    COAGS:  Recent Labs  06/20/16 0231 06/20/16 1330  INR 1.31 1.22    BMP:  Recent Labs  06/29/16 0343 06/30/16 0342 07/01/16 0401 07/02/16 0350  NA 131* 130* 131* 133*  K 4.3 3.8 4.5 3.5  CL 97* 95* 96* 97*  CO2 29 28 28 29   GLUCOSE 95 106* 95 103*  BUN 14 16 16 14   CALCIUM 7.8* 7.7* 7.8* 7.8*  CREATININE 0.57 0.58 0.47 0.44  GFRNONAA >60 >60 >60 >60  GFRAA >60 >60 >60  >60    LIVER FUNCTION TESTS:  Recent Labs  06/29/16 0343 06/30/16 0342 07/01/16 0401 07/02/16 0350  BILITOT 3.5* 4.6* 4.3* 3.6*  AST 31 28 50* 29  ALT 46 41 51 42  ALKPHOS 185* 176* 176* 173*  PROT 5.2* 5.4* 5.5* 5.5*  ALBUMIN 2.3* 2.4* 2.4* 2.4*    Assessment and Plan: 1. Hepatic abscess, s/p perc drain on 7/10, upsized on 7/19 -drain is draining about 20cc of output a day.  This remains serosang in nature. -cont flushes and continue to follow 2. S/p bone marrow biospy today  Electronically Signed: Razia Screws E 07/02/2016, 2:09 PM   I spent a total of 15 Minutes at the the patient's bedside AND on the patient's hospital floor or unit, greater than 50% of which was counseling/coordinating care for hepatic abscess

## 2016-07-02 NOTE — Progress Notes (Signed)
Patient getting blood via a blood warmer, tolerating well , no blood transfusion reaction .

## 2016-07-02 NOTE — Procedures (Signed)
Interventional Radiology Procedure Note  Procedure: CT guided aspirate and core biopsy of right iliac bone Complications: None Recommendations: - Bedrest supine x 1 hrs - Hydrocodone PRN  Pain - Follow biopsy results  Signed,  Naviyah Schaffert K. Stefan Markarian, MD   

## 2016-07-02 NOTE — Progress Notes (Signed)
IP PROGRESS NOTE  Subjective:   She reports tolerating the bone marrow biopsy well. She is currently receiving a red cell transfusion. No new complaint.  Objective: Vital signs in last 24 hours: Blood pressure 127/58, pulse 99, temperature 98 F (36.7 C), temperature source Oral, resp. rate 16, height 5' 3.5" (1.613 m), weight 135 lb (61.236 kg), SpO2 92 %.  Intake/Output from previous day: 07/20 0701 - 07/21 0700 In: 130 [P.O.:120] Out: 1220 [Urine:1200; Drains:20]  Physical Exam:  Abdomen: Serosanguineous fluid in the right abdominal drain Vascular: Trace pitting edema of the leg bilaterally   Lab Results:  Recent Labs  06/30/16 0342 07/02/16 0350  WBC 31.4* 23.1*  HGB 7.6* 6.6*  HCT 21.4* 18.8*  PLT 696* 746*    BMET  Recent Labs  07/01/16 0401 07/02/16 0350  NA 131* 133*  K 4.5 3.5  CL 96* 97*  CO2 28 29  GLUCOSE 95 103*  BUN 16 14  CREATININE 0.47 0.44  CALCIUM 7.8* 7.8*  AST 29, ALT 42, bilirubin 3.6 alk phosphatase 173   06/21/2016: IgM 1351, kappa free light chains 28, 0.8 mg/dL monoclonal protein, IgM kappa  Medications: I have reviewed the patient's current medications.  Assessment/Plan:   1. Fever, Abnormal liver enzymes, hyperbilirubinemia, hypodense liver lesions noted on CT 06/19/2016  Status post placement of a liver drain on 06/21/2010, New drain placed 06/30/2016 2. 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 High titer cold agglutinin, serum monoclonal IgM kappa protein   She appears stable. She underwent a diagnostic bone marrow biopsy earlier today.  Recommendations: 1. Continue antibiotics per infectious disease 2. Transfuse packed today and as needed for symptomatic anemia 3. Please call Hematology as needed over the weekend. 4. I will follow-up on the bone marrow biopsy result and see her on 07/05/2016.    LOS: 13 days   Betsy Coder, MD   07/02/2016, 2:01 PM

## 2016-07-03 LAB — COMPREHENSIVE METABOLIC PANEL
ALBUMIN: 2.6 g/dL — AB (ref 3.5–5.0)
ALT: 34 U/L (ref 14–54)
AST: 34 U/L (ref 15–41)
Alkaline Phosphatase: 169 U/L — ABNORMAL HIGH (ref 38–126)
Anion gap: 6 (ref 5–15)
BILIRUBIN TOTAL: 3.1 mg/dL — AB (ref 0.3–1.2)
BUN: 12 mg/dL (ref 6–20)
CO2: 26 mmol/L (ref 22–32)
CREATININE: 0.43 mg/dL — AB (ref 0.44–1.00)
Calcium: 8 mg/dL — ABNORMAL LOW (ref 8.9–10.3)
Chloride: 100 mmol/L — ABNORMAL LOW (ref 101–111)
GFR calc Af Amer: >60 mL/min (ref >60)
Glucose, Bld: 98 mg/dL (ref 65–99)
Potassium: 4.2 mmol/L (ref 3.5–5.1)
Sodium: 132 mmol/L — ABNORMAL LOW (ref 135–145)
TOTAL PROTEIN: 5.8 g/dL — AB (ref 6.5–8.1)

## 2016-07-03 MED ORDER — TAMSULOSIN HCL 0.4 MG PO CAPS
0.4000 mg | ORAL_CAPSULE | Freq: Every day | ORAL | Status: DC
  Administered 2016-07-03 – 2016-07-06 (×4): 0.4 mg via ORAL
  Filled 2016-07-03 (×4): qty 1

## 2016-07-03 NOTE — Progress Notes (Addendum)
[facility] Progress Note   Saranda Legrande  GQQ:[id number]  DOB: 04-May-1924  DOA: 06/19/2016 PCP: Tawanna Solo, MD  Brief narrative:  Brianna [name] is a 82 y.o. female with HTN, cold agglutinin disease presenting with temp of 103, jaundice and dark urine and weight loss of about 10 lbs. She had been feeling bad since 7/5 and was seen in the ER for syncope and discharged home. She returned to there ER after she was noted to be jaundiced. She was found to have elevated LFTS, WBC count of 40 and a CT scan showing right lobe hepatic abscesses. She also had a sodium of 125 and potassium of 5.7. Tylenol level was normal.   She is being followed by GI, oncology, ID and IR, she is on IV antibiotics per ID and has a right upper quadrant drain placed by IR. She also has issues with hemolytic anemia, she's been requiring transfusions, oncology is assisting in management of this problem.  Bone Marrow Biopsy was done by IR on 7/21.    Due to her liver abscess and poor appetite she has severe protein calorie malnutrition with third spacing of fluids which is being removed with gentle diuresis.  Pt to return to have new larger drain placed by IR 7/19.   Subjective: Pt tolerated bone marrow biopsy today. She has not been able to ambulate.  She reports being very weak and needs blood transfusion today.     Assessment/Plan: Principal Problem:  Hepatic abscess causing sepsis Sepsis,  Acute liver failure with Elevated LFTs & jaundice - ID, GI and IR following. Sepsis resolved now.  She is currently on unasyn under the guidance of ID, right upper quadrant drain placed by IR on 06/21/2016. LFTs and total bilirubin have improved, question if she has had a past gallstone due to her hemolytic anemia causing disruption and bowel integrity and liver abscess. Will defer duration and route of total IV antibiotics to ID.  Pt s/p new larger drain 7/19 which is draining more.  WBC is trending down.  Pt is on  unasyn IV per ID likely to go home on augmentin.     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 Oncology and following and assisting. There is question of underlying proliferative disorder. Hemoglobin down to 6.6.  Transfuse 2 units 7/21.  Recheck CBC in AM.    Leukocytosis - trending down. mostly neutrophils- suspecting possibly underlying proliferative disorder in addition to current infection, Heme/Onc ordered bone marrow biopsy done 7/21.  Pathology pending.   Difficulty with urination - stable post-void residual.  Pt reports that she is having increasing difficulty with urination.  Will ask for urology consult.  Pt doesn't want foley. I spoke with Dr Alger Simons on phone and I ran the case by him and he suggested starting flomax.  Will go ahead and start flomax and follow.  I'm gonna check another urine and make sure there is not any blood.     Right mid back pain - Voltaren gel, pain is better.   Hyponatremia/ pedal edema - sodium stable. Following.  gentle diuresis with Lasix and monitor.  Hypoalbuminemia/ Moderate protein calorie malnutrition/ anasarca  - due to abscesses and poor appetite, continue protein supplementation, appetite improving, continue gentle diuresis as permitted by blood pressure and renal function.  Hypoxia on exertion -  pulm edema due to third spacing and fluid overload +  atelectasis due to being bed bound she was started on PO Lasix + IS, continue to gently diurese and monitor. Shortness of breath has considerably improved.   Abdominal pain -   Improving.      Benign essential HTN - stable on  Metoprolol  Antibiotics: Anti-infectives    Start     Dose/Rate Route Frequency Ordered Stop   06/30/16 1700  Ampicillin-Sulbactam (UNASYN) 3 g in sodium chloride 0.9 % 100 mL IVPB     3 g 100 mL/hr over 60  Minutes Intravenous Every 8 hours 06/30/16 1618     06/27/16 2300  piperacillin-tazobactam (ZOSYN) IVPB 3.375 g  Status:  Discontinued     3.375 g 12.5 mL/hr over 240 Minutes Intravenous Every 8 hours 06/27/16 1615 06/30/16 1618   06/27/16 1700  piperacillin-tazobactam (ZOSYN) IVPB 3.375 g     3.375 g 100 mL/hr over 30 Minutes Intravenous  Once 06/27/16 1615 06/27/16 1746   06/27/16 1200  fluconazole (DIFLUCAN) tablet 100 mg     100 mg Oral Daily 06/27/16 1111     06/25/16 0930  fluconazole (DIFLUCAN) IVPB 100 mg  Status:  Discontinued     100 mg 50 mL/hr over 60 Minutes Intravenous Every 24 hours 06/25/16 0841 06/27/16 1110   06/20/16 1400  vancomycin (VANCOCIN) 500 mg in sodium chloride 0.9 % 100 mL IVPB  Status:  Discontinued     500 mg 100 mL/hr over 60 Minutes Intravenous Every 24 hours 06/19/16 1254 06/23/16 1550   06/19/16 2200  piperacillin-tazobactam (ZOSYN) IVPB 3.375 g  Status:  Discontinued     3.375 g 12.5 mL/hr over 240 Minutes Intravenous Every 8 hours 06/19/16 1254 06/27/16 1615   06/19/16 1245  piperacillin-tazobactam (ZOSYN) IVPB 3.375 g     3.375 g 100 mL/hr over 30 Minutes Intravenous  Once 06/19/16 1242 06/19/16 1335   06/19/16 1245  vancomycin (VANCOCIN) IVPB 1000 mg/200 mL premix     1,000 mg 200 mL/hr over 60 Minutes Intravenous  Once 06/19/16 1242 06/19/16 1456     Code Status: DNR Family Communication: daughter bedside Disposition Plan: SNF Mon or Tues DVT prophylaxis: SCDS Consultants: hematology, GI, ID, urology Procedures: RUQ drain  Objective: Filed Weights   07/01/16 0502 07/02/16 0654 07/03/16 0717  Weight: 59.92 kg (132 lb 1.6 oz) 61.236 kg (135 lb) 61.145 kg (134 lb 12.8 oz)    Intake/Output Summary (Last 24 hours) at 07/03/16 0913 Last data filed at 07/03/16 0847  Gross per 24 hour  Intake   1378 ml  Output   1525 ml  Net   -147 ml     Vitals Filed Vitals:   07/02/16 1718 07/02/16 1940 07/02/16 2317 07/03/16 0717  BP: 132/62 147/79  153/71 145/73  Pulse: 90 99 102 102  Temp: 99.5 F (37.5 C) 99 F (37.2 C) 98.9 F (37.2 C) 98.2 F (36.8 C)  TempSrc: Oral Oral Oral Oral  Resp: 16 18 16 18   Height:      Weight:    61.145 kg (134 lb 12.8 oz)  SpO2: 94% 99% 93% 94%    Exam:  General:  Pt is alert, not in acute distress, pt appears LESS jaundiced.  HEENT: mild scleral icterus, No thrush, oral mucosa moist  Cardiovascular: regular rate and rhythm, S1/S2 No murmur  Respiratory: clear to auscultation bilaterally   Abdomen: Soft, +Bowel sounds, less distended, tenderness RUQ/RLQ with light palpation, - drain in place  MSK: No cyanosis or clubbing- 1+ pitting pedal edema   Data Reviewed: Basic Metabolic Panel:  Recent Labs Lab 06/29/16 0343 06/30/16 0342 07/01/16 0401 07/02/16 0350 07/03/16 0518  NA 131* 130* 131* 133* 132*  K 4.3 3.8 4.5 3.5 4.2  CL 97* 95* 96* 97* 100*  CO2 29 28 28 29 26   GLUCOSE 95 106* 95 103* 98  BUN 14 16 16 14 12   CREATININE 0.57 0.58 0.47 0.44 0.43*  CALCIUM 7.8* 7.7* 7.8* 7.8* 8.0*  MG  --  2.2  --   --   --    Liver Function Tests:  Recent Labs Lab 06/29/16 0343 06/30/16 0342 07/01/16 0401 07/02/16 0350 07/03/16 0518  AST 31 28 50* 29 34  ALT 46 41 51 42 34  ALKPHOS 185* 176* 176* 173* 169*  BILITOT 3.5* 4.6* 4.3* 3.6* 3.1*  PROT 5.2* 5.4* 5.5* 5.5* 5.8*  ALBUMIN 2.3* 2.4* 2.4* 2.4* 2.6*   No results for input(s): LIPASE, AMYLASE in the last 168 hours. No results for input(s): AMMONIA in the last 168 hours. CBC:  Recent Labs Lab 06/27/16 0325 06/28/16 0311 06/29/16 0343 06/30/16 0342 07/02/16 0350  WBC 38.1* 40.7* 34.2* 31.4* 23.1*  NEUTROABS 30.0*  --   --   --   --   HGB 6.7* 7.9* 7.8* 7.6* 6.6*  HCT 19.3* 21.5* 21.7* 21.4* 18.8*  MCV 89.8 91.1 93.1 93.4 94.5  PLT 680* 651* 694* 696* 746*   Cardiac Enzymes: No results for input(s): CKTOTAL, CKMB, CKMBINDEX, TROPONINI in the last 168 hours. BNP (last 3 results) No results for input(s): BNP  in the last 8760 hours.  ProBNP (last 3 results) No results for input(s): PROBNP in the last 8760 hours.  CBG:  Recent Labs Lab 06/28/16 1201  GLUCAP 145*    No results found for this or any previous visit (from the past 240 hour(s)).   Studies: Ct Biopsy  07/02/2016  INDICATION: 82 year old female with anemia and leukocytosis. EXAM: CT GUIDED BONE MARROW ASPIRATION AND CORE BIOPSY Interventional Radiologist:  Criselda Peaches, MD MEDICATIONS: None. ANESTHESIA/SEDATION: Moderate (conscious) sedation was employed during this procedure. A total of 1.5 milligrams versed and 100 micrograms fentanyl were administered intravenously. The patient's level of consciousness and vital signs were monitored continuously by radiology nursing throughout the procedure under my direct supervision. Total monitored sedation time: 8 minutes FLUOROSCOPY TIME:  None COMPLICATIONS: None immediate. Estimated blood loss: <25 mL PROCEDURE: Informed written consent was obtained from the patient after a thorough discussion of the procedural risks, benefits and alternatives. All questions were addressed. Maximal Sterile Barrier Technique was utilized including caps, mask, sterile gowns, sterile gloves, sterile drape, hand hygiene and skin antiseptic. A timeout was performed prior to the initiation of the procedure. The patient was positioned prone and non-contrast localization CT was performed of the pelvis to demonstrate the iliac marrow spaces. Maximal barrier sterile technique utilized including caps, mask, sterile gowns, sterile gloves, large sterile drape, hand hygiene, and betadine prep. Under sterile conditions and local anesthesia, an 11 gauge coaxial bone biopsy needle was advanced into the left iliac marrow space. Needle position was confirmed with CT imaging. Initially, bone marrow aspiration was performed. Next, the 11 gauge outer cannula was utilized to obtain a left iliac bone marrow core biopsy. Needle was  removed. Hemostasis was obtained with compression. The patient tolerated the procedure well. Samples were prepared with the cytotechnologist. IMPRESSION: Successful CT-guided bone marrow biopsy. Electronically Signed   By: [name]  M.D.   On: 07/02/2016 11:54    Scheduled Meds:  Scheduled Meds: . sodium chloride   Intravenous Once  . ampicillin-sulbactam (UNASYN) IV  3 g Intravenous Q8H  . antiseptic oral rinse  7 mL Mouth Rinse BID  . conjugated estrogens  1 Applicatorful Vaginal Once per day on Mon Wed Fri  . diclofenac sodium  2 g Topical QID  . fluconazole  100 mg Oral Daily  . folic acid  1 mg Oral Daily  . metoprolol succinate  25 mg Oral Daily  . multivitamin with minerals  1 tablet Oral Daily  . nutrition supplement (JUVEN)  2 packet Oral BID BM  . polyethylene glycol  17 g Oral BID  . ranitidine  150 mg Oral BID  . simethicone  80 mg Oral QID  . sodium chloride flush  3 mL Intravenous Q12H  . sodium chloride  1 g Oral BID WC   Continuous Infusions:    Time spent on care of this patient: 24 min  Signature  Irwin Brakeman M.D on 07/03/2016 at 9:13 AM  Between 7am to 7pm - Pager - [phone], After 7pm go to [email or web address] - password [facility]  [facility]  - Office  [phone]

## 2016-07-03 NOTE — Progress Notes (Signed)
Patient requested that she be allowed to sit on [facility] without staff in room d/t being unable to void if being watched. Patient is very compliant with calling for assistance to get back in bed.

## 2016-07-04 ENCOUNTER — Inpatient Hospital Stay (HOSPITAL_COMMUNITY): Payer: Medicare Other

## 2016-07-04 LAB — CBC
HEMATOCRIT: 26.8 % — AB (ref 36.0–46.0)
Hemoglobin: 9.1 g/dL — ABNORMAL LOW (ref 12.0–15.0)
MCH: 31.7 pg (ref 26.0–34.0)
MCHC: 34 g/dL (ref 30.0–36.0)
MCV: 93.4 fL (ref 78.0–100.0)
RBC: 2.87 MIL/uL — ABNORMAL LOW (ref 3.87–5.11)
RDW: 19.6 % — AB (ref 11.5–15.5)
WBC: 18 10*3/uL — ABNORMAL HIGH (ref 4.0–10.5)

## 2016-07-04 LAB — TYPE AND SCREEN
ABO/RH(D): A POS
Antibody Screen: POSITIVE
DAT, IgG: NEGATIVE
UNIT DIVISION: 0
UNIT DIVISION: 0

## 2016-07-04 MED ORDER — SACCHAROMYCES BOULARDII 250 MG PO CAPS
250.0000 mg | ORAL_CAPSULE | Freq: Two times a day (BID) | ORAL | Status: DC
  Administered 2016-07-04 – 2016-07-05 (×4): 250 mg via ORAL
  Filled 2016-07-04 (×4): qty 1

## 2016-07-04 MED ORDER — IOPAMIDOL (ISOVUE-300) INJECTION 61%
100.0000 mL | Freq: Once | INTRAVENOUS | Status: AC | PRN
  Administered 2016-07-04: 100 mL via INTRAVENOUS

## 2016-07-04 NOTE — Evaluation (Signed)
Occupational Therapy Evaluation Patient Details Name: Brianna [name] MRN: [id number] DOB: [birth year] Today's Date: 07/04/2016    History of Present Illness 82 y/o female admitted 7/8 with sepsis with jaundice due to hepatic abscess (s/p perc drain on 7/10, upsized on 7/19).  underwent bone marrow biopsy 7/21.  PMH includes spinal stenosis, thoracic vertebral fx   Clinical Impression   Pt admitted with above. She demonstrates the below listed deficits and will benefit from continued OT to maximize safety and independence with BADLs.  Pt requires mod A for ADLs and min A for functional transfers.  Recommend SNF level rehab at discharge.       Follow Up Recommendations  SNF    Equipment Recommendations  None recommended by OT    Recommendations for Other Services       Precautions / Restrictions Precautions Precautions: Fall Precaution Comments: R perc drain      Mobility Bed Mobility                  Transfers Overall transfer level: Needs assistance Equipment used: Rolling walker (2 wheeled) Transfers: Sit to/from [facility] Sit to Stand: Min guard Stand pivot transfers: Min assist       General transfer comment: verbal cues for hand placement and assist to steady     Balance Overall balance assessment: History of Falls                                          ADL Overall ADL's : Needs assistance/impaired Eating/Feeding: Independent   Grooming: Wash/dry hands;Wash/dry face;Oral care;Brushing hair;Set up;Sitting   Upper Body Bathing: Set up;Sitting   Lower Body Bathing: Moderate assistance;Sit to/from stand   Upper Body Dressing : Set up;Sitting   Lower Body Dressing: Maximal assistance;Sit to/from stand Lower Body Dressing Details (indicate cue type and reason): Pt reports LE edema hinders her ability to access feet  Toilet Transfer: Minimal assistance;Ambulation;BSC;RW Toilet Transfer Details (indicate cue  type and reason): Pt requires assist to steady  Toileting- Clothing Manipulation and Hygiene: Minimal assistance;Sit to/from stand       Functional mobility during ADLs: Min guard;Minimal assistance;Rolling walker General ADL Comments: Pt reports she, at times, struggled with donning socks.  Discussed use of AE, and she is very interested as she has chronic back pain       Vision     Perception     Praxis      Pertinent Vitals/Pain Pain Assessment: No/denies pain     Hand Dominance Right   Extremity/Trunk Assessment Upper Extremity Assessment Upper Extremity Assessment: Generalized weakness   Lower Extremity Assessment Lower Extremity Assessment: Defer to PT evaluation   Cervical / Trunk Assessment Cervical / Trunk Assessment: Kyphotic   Communication Communication Communication: No difficulties   Cognition Arousal/Alertness: Awake/alert Behavior During Therapy: WFL for tasks assessed/performed Overall Cognitive Status: Within Functional Limits for tasks assessed                     General Comments       Exercises       Shoulder Instructions      Home Living Family/patient expects to be discharged to:: Skilled nursing facility  Prior Functioning/Environment Level of Independence: Independent        Comments: Drives still.  Was planning to move back to [place] mid August     OT Diagnosis: Generalized weakness;Acute pain   OT Problem List: Decreased strength;Decreased activity tolerance;Impaired balance (sitting and/or standing);Decreased knowledge of use of DME or AE;Pain;Increased edema   OT Treatment/Interventions: Self-care/ADL training;Therapeutic exercise;DME and/or AE instruction;Therapeutic activities;Patient/family education;Balance training    OT Goals(Current goals can be found in the care plan section) Acute Rehab OT Goals Patient Stated Goal: regain independence and move  back to [place]  OT Goal Formulation: With patient Time For Goal Achievement: 07/18/16 Potential to Achieve Goals: Good ADL Goals Pt Will Perform Grooming: with min guard assist;standing Pt Will Perform Lower Body Bathing: with min guard assist;with adaptive equipment;sit to/from stand Pt Will Perform Lower Body Dressing: with min guard assist;sit to/from stand;with adaptive equipment Pt Will Transfer to Toilet: with min guard assist;ambulating;bedside commode;grab bars;regular height toilet Pt Will Perform Toileting - Clothing Manipulation and hygiene: with min guard assist;sit to/from stand  OT Frequency: Min 2X/week   Barriers to D/C: Decreased caregiver support          Co-evaluation              End of Session Equipment Utilized During Treatment: Rolling walker Nurse Communication: Mobility status  Activity Tolerance: Patient tolerated treatment well Patient left: in chair;with call bell/phone within reach;with family/visitor present;with chair alarm set   Time: 7972-8206 OT Time Calculation (min): 36 min Charges:  OT General Charges $OT Visit: 1 Procedure OT Evaluation $OT Eval Moderate Complexity: 1 Procedure OT Treatments $Therapeutic Activity: 8-22 mins G-Codes:    Edvardo Honse M 25-Jul-2016, 12:36 PM

## 2016-07-04 NOTE — Progress Notes (Signed)
Patient ID: Brianna [name], female   DOB: 1924/02/11, 82 y.o.   MRN: [id number]    Referring Physician(s): Dr. [name]  Supervising Physician: [name]  Patient Status: inpt  Chief Complaint: Hepatic abscess  Subjective: Patient looks/feels pretty good this am. Sitting up in chair. Not as much soreness at drain site Had another CT this am  Allergies: Other; Compazine [prochlorperazine]; Erythromycin; Lyrica [pregabalin]; Promethazine; Tramadol; and Vistaril [hydroxyzine hcl]  Medications:  Current Facility-Administered Medications:  .  0.9 %  sodium chloride infusion, , Intravenous, Once, Hewitt Shorts [place], PA-C .  ALPRAZolam Duanne Moron) tablet 0.5 mg, 0.5 mg, Oral, QHS PRN, Donne Hazel, MD, 0.5 mg at 07/03/16 2127 .  Ampicillin-Sulbactam (UNASYN) 3 g in sodium chloride 0.9 % 100 mL IVPB, 3 g, Intravenous, Q8H, Campbell Riches, MD, 3 g at 07/04/16 0815 .  antiseptic oral rinse (CPC / CETYLPYRIDINIUM CHLORIDE 0.05%) solution 7 mL, 7 mL, Mouth Rinse, BID, Belkys A Regalado, MD, 7 mL at 07/03/16 2200 .  benzonatate (TESSALON) capsule 100 mg, 100 mg, Oral, TID PRN, Belkys A Regalado, MD, 100 mg at 06/25/16 2245 .  conjugated estrogens (PREMARIN) vaginal cream 1 Applicatorful, 1 Applicatorful, Vaginal, Once per day on Mon Wed Fri, Elmarie Shiley, MD, 1 Applicatorful at 81/59/47 1102 .  diclofenac sodium (VOLTAREN) 1 % transdermal gel 2 g, 2 g, Topical, QID, Debbe Odea, MD, 2 g at 07/04/16 1109 .  diphenhydrAMINE (BENADRYL) capsule 25 mg, 25 mg, Oral, QHS PRN, Belkys A Regalado, MD, 25 mg at 07/03/16 2127 .  fentaNYL (SUBLIMAZE) injection 12.5 mcg, 12.5 mcg, Intravenous, Q4H PRN, Debbe Odea, MD, 12.5 mcg at 07/01/16 2253 .  fluconazole (DIFLUCAN) tablet 100 mg, 100 mg, Oral, Daily, Debbe Odea, MD, 100 mg at 07/04/16 1107 .  folic acid (FOLVITE) tablet 1 mg, 1 mg, Oral, Daily, Ladell Pier, MD, 1 mg at 07/04/16 1108 .  ibuprofen (ADVIL,MOTRIN) tablet 400 mg, 400 mg,  Oral, Q6H PRN, Belkys A Regalado, MD, 400 mg at 06/28/16 2159 .  metoprolol succinate (TOPROL-XL) 24 hr tablet 25 mg, 25 mg, Oral, Daily, Belkys A Regalado, MD, 25 mg at 07/04/16 1108 .  multivitamin with minerals tablet 1 tablet, 1 tablet, Oral, Daily, Belkys A Regalado, MD, 1 tablet at 07/04/16 1107 .  nutrition supplement (JUVEN) (JUVEN) powder packet 2 packet, 2 packet, Oral, BID BM, Thurnell Lose, MD, 2 packet at 07/03/16 1018 .  ondansetron (ZOFRAN) tablet 4 mg, 4 mg, Oral, Q6H PRN, 4 mg at 06/30/16 0007 **OR** ondansetron (ZOFRAN) injection 4 mg, 4 mg, Intravenous, Q6H PRN, Belkys A Regalado, MD, 4 mg at 07/03/16 1806 .  ondansetron (ZOFRAN) tablet 8 mg, 8 mg, Oral, Q8H PRN, Belkys A Regalado, MD, 8 mg at 06/22/16 1835 .  polyethylene glycol (MIRALAX / GLYCOLAX) packet 17 g, 17 g, Oral, BID, Amy S Esterwood, PA-C, 17 g at 07/03/16 1018 .  ranitidine (ZANTAC) 150 MG/10ML syrup 150 mg, 150 mg, Oral, BID, Debbe Odea, MD, 150 mg at 07/03/16 2127 .  saccharomyces boulardii (FLORASTOR) capsule 250 mg, 250 mg, Oral, BID, Clanford Marisa Hua, MD, 250 mg at 07/04/16 1107 .  simethicone (MYLICON) chewable tablet 80 mg, 80 mg, Oral, QID PRN, Donne Hazel, MD, 80 mg at 06/25/16 1303 .  simethicone (MYLICON) chewable tablet 80 mg, 80 mg, Oral, QID, Debbe Odea, MD, 80 mg at 07/04/16 1108 .  sodium chloride flush (NS) 0.9 % injection 3 mL, 3 mL, Intravenous, Q12H, Belkys A Regalado, MD, 3  mL at 07/04/16 1109 .  sodium chloride tablet 1 g, 1 g, Oral, BID WC, Debbe Odea, MD, 1 g at 07/04/16 1108 .  tamsulosin (FLOMAX) capsule 0.4 mg, 0.4 mg, Oral, QPC breakfast, Clanford Marisa Hua, MD, 0.4 mg at 07/04/16 1107   Vital Signs: BP (!) 146/58 (BP Location: Left Arm)   Pulse (!) 103   Temp 99.1 F (37.3 C) (Oral)   Resp 20   Ht 5' 3.5" (1.613 m)   Wt 134 lb 12.8 oz (61.1 kg)   SpO2 97%   BMI 23.50 kg/m   Physical Exam: Abd: soft, drain with 20cc or serosang output in last 24hrs.  Drain site is  c/d/i.  Imaging: Ct Abdomen Pelvis W Contrast  Result Date: 07/04/2016 CLINICAL DATA:  Follow-up known liver abscesses. EXAM: CT ABDOMEN AND PELVIS WITH CONTRAST TECHNIQUE: Multidetector CT imaging of the abdomen and pelvis was performed using the standard protocol following bolus administration of intravenous contrast. CONTRAST:  123m ISOVUE-300 IOPAMIDOL (ISOVUE-300) INJECTION 61% COMPARISON:  June 19, 2016 and June 25, 2016 FINDINGS: Bilateral pleural effusions and atelectasis are stable since June 25, 2016. The lung bases are otherwise unchanged. No free air. Free fluid in the pelvis is increased in the interval. There is rim enhancement around a pocket of fluid in the right pelvis on series 3, image 61, measuring 3 by 1.6 cm. No abnormal fluid otherwise seen in the abdomen. The percutaneous drainage catheter remains in the liver. The pigtail is now within the more posterior abscess. This abscess is smaller in the interval measuring 3.3 x 3.5 cm today versus 4.8 by 4.2 cm previously. The more peripheral abscess is also being appropriately drained and is also smaller in the interval. There is a track of low attenuation extending from the more peripheral abscess into the posterior right hepatic lobe which was not seen previously. There is also a small region of low attenuation in the hepatic dome on axial image 11, not clearly seen previously. The pigtail catheter originally ended in this region of the medial hepatic dome on the June 25, 2016 study. Periportal edema remains. The portal vein is patent. A subcapsular hepatic collection on series 3, image 25 remains measuring 3.8 by 1.1 cm today versus 6.7 by 1.1 cm previously. The gallbladder is distended to a similar degree compared to June 25, 2016. The spleen and adrenal glands are normal. An indeterminate low-attenuation lesion measuring 17 mm in the left kidney on image 28 is stable. Several renal cysts are noted. Three low-attenuation rounded regions of  the pancreas are stable. Atherosclerosis is seen in the abdominal aorta. There is a small hiatal hernia. The stomach is fluid filled and mildly distended but there is no convincing evidence of outlet obstruction. The small bowel is normal. The colon is normal. There is no secondary evidence of appendicitis. Free fluid in the pelvis. Rim enhancing fluid to collection identified in the right posterior pelvis. No adenopathy or mass. No other pelvic changes. Significant subcutaneous edema remains but has improved in the interval. Delayed images demonstrate no filling defects in the upper renal collecting systems. Significant off loss of height of T11, unchanged. Mild loss of height of L1 is also unchanged. IMPRESSION: 1. The previously seen abscesses in the liver are smaller in the interval. There is a small region of low attenuation near the medial hepatic dome which was the site of the pigtail catheter on the previous study. This could be postprocedural. It would be difficult to exclude a  developing abscess in this region. Recommend attention on follow-up. There is also a track of low attenuation extending from the more anterior superficial abscess into the posterior hepatic dome. Extension of infection is not excluded but the track like appearance raises the possibility of postprocedural change. Recommend attention on followup. 2. Rim enhancing collection of fluid in the right posterior pelvis concerning for developing abscess. 3. The subcapsular fluid collection over the right hepatic lobe is smaller in the interval. 4. Stable indeterminate lesion in the left kidney. 5. Stable low-attenuation lesions in the pancreas. 6. Stable compression fractures in the spine. Electronically Signed   By: Dorise Bullion III M.D   On: 07/04/2016 12:13  Ct Abscess Cath Exchange  Result Date: 07/01/2016 INDICATION: Status post percutaneous drainage of 2 separate hepatic abscesses on 06/21/2016 with placement of an elongated  drainage catheter across both collections. There has been worsening elevation of white blood cell count with CT on 06/25/2016 demonstrating slight reduction in a lateral right hepatic abscess and minimal change in a medial right lobe hepatic abscess. EXAM: CT ABSCESS CATH EXCHANGE MEDICATIONS: The patient is currently admitted to the hospital and receiving intravenous antibiotics. The antibiotics were administered within an appropriate time frame prior to the initiation of the procedure. ANESTHESIA/SEDATION: Fentanyl 50 mcg IV; Versed 3.0 mg IV Moderate Sedation Time:  62 minutes. The patient was continuously monitored during the procedure by the interventional radiology nurse under my direct supervision. COMPLICATIONS: None immediate. PROCEDURE: Informed written consent was obtained from the patient after a thorough discussion of the procedural risks, benefits and alternatives. All questions were addressed. Maximal Sterile Barrier Technique was utilized including caps, mask, sterile gowns, sterile gloves, sterile drape, hand hygiene and skin antiseptic. A timeout was performed prior to the initiation of the procedure. The pre-existing exiting hepatic drainage catheter and surrounding skin were prepped with chlorhexidine. During the procedure, 1% lidocaine was infiltrated to provide local anesthesia around the tube exit site. The current drainage catheter course was imaged by CT. The catheter was cut and removed over a guidewire. A 5 French diagnostic catheter was then advanced into the liver parenchyma. The catheter and guidewire were manipulated to gain wire access into the deeper, more medial hepatic abscess. A new 12 French drainage catheter was then advanced over the wire and into the collection. The catheter positioning was confirmed by CT. The catheter was flushed with saline and connected to a suction bulb. It was secured at the skin with a Prolene retention suture and StatLock device. The rest of the  procedure was spent trying to gain separate access into the lateral hepatic abscess. 18 gauge trocar needles were advanced into this collection and guidewires advanced as well as attempts at aspiration. Ultimately, attempt to place a second new hepatic abscess drainage catheter were aborted. FINDINGS: Based on unenhanced CT images obtained, the lateral right lobe hepatic abscess clearly is significantly smaller than on the imaging obtained at the time of initial drainage. Maximal diameter of this area of residual collections/edema has decreased from approximately 4.3 cm down to 2.6 cm. The medial abscess is also smaller than original size with maximal diameter of approximately 4.1 cm compared to 4.7 cm prior to drainage. The pre-existing multi side-hole drainage catheter extends through both collections and terminates in the medial dome of the liver. With catheter exchange, there with success in placing a standard 12 French pigtail drainage catheter into the larger medial collection with return of some debris and bloody fluid. With multiple separate needle  accesses of the more lateral collection, there was inability to aspirate free fluid. Guidewire advancement extended through the parenchyma and did not coil within the collection to allow placement of a second drain. Based on decreased in size as well as inability to accurately place a second drain in this collection, attempts at further new drainage catheter placement were aborted. This area appears to be resolving and will be followed while the patient receives appropriate antibiotic therapy. IMPRESSION: 1. Successful exchange of the elongated multi side-hole drain placed initially with a standard 12 French pigtail drain formed in the larger residual medial right lobe hepatic abscess. This drain will be connected to suction bulb drainage and flushed 3 times daily. 2. The lateral hepatic abscess shows more significant size reduction after initial drainage.  Attempts at placing a second drain in this collection were unsuccessful due to lack of liquefaction and inability to coiled a wire within the collection. This may be essentially resolved from a drainage perspective with some residual edema/fluid remaining. This collection will be followed as well as the remaining abscess containing a drain while the patient is receiving appropriate antibiotic therapy. CT resolution of hepatic collections with drainage can take weeks to months and once the patient is able to be discharged from the hospital, abscess drainage catheter management and status of collections can be followed in the IR drain Clinic. Electronically Signed   By: Aletta Edouard M.D.   On: 07/01/2016 09:01   Ct Biopsy  Result Date: 07/02/2016 INDICATION: 82 year old female with anemia and leukocytosis. EXAM: CT GUIDED BONE MARROW ASPIRATION AND CORE BIOPSY Interventional Radiologist:  Criselda Peaches, MD MEDICATIONS: None. ANESTHESIA/SEDATION: Moderate (conscious) sedation was employed during this procedure. A total of 1.5 milligrams versed and 100 micrograms fentanyl were administered intravenously. The patient's level of consciousness and vital signs were monitored continuously by radiology nursing throughout the procedure under my direct supervision. Total monitored sedation time: 8 minutes FLUOROSCOPY TIME:  None COMPLICATIONS: None immediate. Estimated blood loss: <25 mL PROCEDURE: Informed written consent was obtained from the patient after a thorough discussion of the procedural risks, benefits and alternatives. All questions were addressed. Maximal Sterile Barrier Technique was utilized including caps, mask, sterile gowns, sterile gloves, sterile drape, hand hygiene and skin antiseptic. A timeout was performed prior to the initiation of the procedure. The patient was positioned prone and non-contrast localization CT was performed of the pelvis to demonstrate the iliac marrow spaces. Maximal  barrier sterile technique utilized including caps, mask, sterile gowns, sterile gloves, large sterile drape, hand hygiene, and betadine prep. Under sterile conditions and local anesthesia, an 11 gauge coaxial bone biopsy needle was advanced into the left iliac marrow space. Needle position was confirmed with CT imaging. Initially, bone marrow aspiration was performed. Next, the 11 gauge outer cannula was utilized to obtain a left iliac bone marrow core biopsy. Needle was removed. Hemostasis was obtained with compression. The patient tolerated the procedure well. Samples were prepared with the cytotechnologist. IMPRESSION: Successful CT-guided bone marrow biopsy. Electronically Signed   By: [name] M.D.   On: 07/02/2016 11:54    Labs:  CBC:  Recent Labs  06/29/16 0343 06/30/16 0342 07/02/16 0350 07/04/16 0638  WBC 34.2* 31.4* 23.1* 18.0*  HGB 7.8* 7.6* 6.6* 9.1*  HCT 21.7* 21.4* 18.8* 26.8*  PLT 694* 696* 746* PLATELET CLUMPS NOTED ON SMEAR, COUNT APPEARS INCREASED    COAGS:  Recent Labs  06/20/16 0231 06/20/16 1330  INR 1.31 1.22    BMP:  Recent Labs  06/30/16 0342 07/01/16 0401 07/02/16 0350 07/03/16 0518  NA 130* 131* 133* 132*  K 3.8 4.5 3.5 4.2  CL 95* 96* 97* 100*  CO2 _0 GLUCOSE 106* 95 103* 98  BUN _1 CALCIUM 7.7* 7.8* 7.8* 8.0*  CREATININE 0.58 0.47 0.44 0.43*  GFRNONAA >60 >60 >60 >60  GFRAA >60 >60 >60 >60    LIVER FUNCTION TESTS:  Recent Labs  06/30/16 0342 07/01/16 0401 07/02/16 0350 07/03/16 0518  BILITOT 4.6* 4.3* 3.6* 3.1*  AST 28 50* 29 34  ALT 41 51 42 34  ALKPHOS 176* 176* 173* 169*  PROT 5.4* 5.5* 5.5* 5.8*  ALBUMIN 2.4* 2.4* 2.4* 2.6*    Assessment and Plan: 1. Hepatic abscess, s/p perc drain on 7/10, exchanged on 7/19  This remains serosang in nature. CT shows interval improvement, changes noted in report are likely post procedural related WBC continues to trend down -cont flushes and continue to  follow   Electronically Signed: Ascencion Dike 07/04/2016, 12:28 PM   I spent a total of 15 Minutes at the the patient's bedside AND on the patient's hospital floor or unit, greater than 50% of which was counseling/coordinating care for hepatic abscess

## 2016-07-04 NOTE — Care Management Important Message (Signed)
Important Message  Patient Details  Name: Brianna [name] MRN: [id number] Date of Birth: [birth year]   Medicare Important Message Given:  Yes    Apolonio Schneiders, RN 07/04/2016, 3:02 PM

## 2016-07-04 NOTE — Progress Notes (Signed)
[facility] Progress Note   Brianna [name]  HFW:[id number]  DOB: 1924/03/22  DOA: 06/19/2016 PCP: Tawanna Solo, MD  Brief narrative:  Brianna [name] is a 82 y.o. female with HTN, cold agglutinin disease presenting with temp of 103, jaundice and dark urine and weight loss of about 10 lbs. She had been feeling bad since 7/5 and was seen in the ER for syncope and discharged home. She returned to there ER after she was noted to be jaundiced. She was found to have elevated LFTS, WBC count of 40 and a CT scan showing right lobe hepatic abscesses. She also had a sodium of 125 and potassium of 5.7. Tylenol level was normal.   She is being followed by GI, oncology, ID and IR, she is on IV antibiotics per ID and has a right upper quadrant drain placed by IR. She also has issues with hemolytic anemia, she's been requiring transfusions, oncology is assisting in management of this problem.  Bone Marrow Biopsy was done by IR on 7/21.    Due to her liver abscess and poor appetite she has severe protein calorie malnutrition with third spacing of fluids which is being removed with gentle diuresis.  Pt to return to have new larger drain placed by IR 7/19.   Subjective: Pt still significantly weak but motivated for rehab.  Having some loose stools but not diarrhea.       Assessment/Plan: Principal Problem:  Hepatic abscess causing sepsis Sepsis,  Acute liver failure with Elevated LFTs & jaundice - ID, GI and IR following. Sepsis resolved now.  She is currently on unasyn under the guidance of ID, right upper quadrant drain placed by IR on 06/21/2016. LFTs and total bilirubin have improved, question if she has had a past gallstone due to her hemolytic anemia causing disruption and bowel integrity and liver abscess. Will defer duration and route of total IV antibiotics to ID.  Pt s/p new larger drain 7/19 which is not draining much anymore.  WBC is trending down.  Pt is on unasyn IV per ID likely to go  home on augmentin.   Repeat CT abd/pelv to assess abscesses.   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 Oncology and following and assisting. There is question of underlying proliferative disorder. Hemoglobin down to 6.6.  Transfused 2 units 7/21.  Follow CBC.    Leukocytosis - trending down. mostly neutrophils- suspecting possibly underlying proliferative disorder in addition to current infection, Heme/Onc ordered bone marrow biopsy done 7/21.  Pathology pending.   Difficulty with urination - stable post-void residual.  Pt reports that she is having increasing difficulty with urination.  Will ask for urology consult.  Pt doesn't want foley. I spoke with Dr Alger Simons on phone and I ran the case by him and he suggested starting flomax.  Flomax started 7/22.  Chronic Right mid back pain - Voltaren gel as needed seems to help.  Tylenol prn.  Hyponatremia/ pedal edema - sodium stable. Following.  Elevate extremities.   Hypoalbuminemia/ Moderate protein calorie malnutrition/ anasarca - due to abscesses and poor appetite, continue protein supplementation, appetite improving, continue gentle diuresis as permitted by blood pressure and renal function.  Hypoxia on exertion -  pulm edema due to third spacing and fluid overload +  atelectasis due to being bed bound she was started  on PO Lasix + IS, continue to gently diurese and monitor. Shortness of breath has considerably improved.   Generalized weakness and debility - pt has become deconditioned and will require SNF rehab.  Continue PT while in hospital.  OOB today.  Get up in chair.    Abdominal pain -   Improving.      Benign essential HTN - Metoprolol, add low dose amlodipine  Antibiotics: Anti-infectives    Start     Dose/Rate Route Frequency Ordered Stop   06/30/16 1700   Ampicillin-Sulbactam (UNASYN) 3 g in sodium chloride 0.9 % 100 mL IVPB     3 g 100 mL/hr over 60 Minutes Intravenous Every 8 hours 06/30/16 1618     06/27/16 2300  piperacillin-tazobactam (ZOSYN) IVPB 3.375 g  Status:  Discontinued     3.375 g 12.5 mL/hr over 240 Minutes Intravenous Every 8 hours 06/27/16 1615 06/30/16 1618   06/27/16 1700  piperacillin-tazobactam (ZOSYN) IVPB 3.375 g     3.375 g 100 mL/hr over 30 Minutes Intravenous  Once 06/27/16 1615 06/27/16 1746   06/27/16 1200  fluconazole (DIFLUCAN) tablet 100 mg     100 mg Oral Daily 06/27/16 1111     06/25/16 0930  fluconazole (DIFLUCAN) IVPB 100 mg  Status:  Discontinued     100 mg 50 mL/hr over 60 Minutes Intravenous Every 24 hours 06/25/16 0841 06/27/16 1110   06/20/16 1400  vancomycin (VANCOCIN) 500 mg in sodium chloride 0.9 % 100 mL IVPB  Status:  Discontinued     500 mg 100 mL/hr over 60 Minutes Intravenous Every 24 hours 06/19/16 1254 06/23/16 1550   06/19/16 2200  piperacillin-tazobactam (ZOSYN) IVPB 3.375 g  Status:  Discontinued     3.375 g 12.5 mL/hr over 240 Minutes Intravenous Every 8 hours 06/19/16 1254 06/27/16 1615   06/19/16 1245  piperacillin-tazobactam (ZOSYN) IVPB 3.375 g     3.375 g 100 mL/hr over 30 Minutes Intravenous  Once 06/19/16 1242 06/19/16 1335   06/19/16 1245  vancomycin (VANCOCIN) IVPB 1000 mg/200 mL premix     1,000 mg 200 mL/hr over 60 Minutes Intravenous  Once 06/19/16 1242 06/19/16 1456     Code Status: DNR Family Communication: daughter bedside Disposition Plan: SNF Mon or Tues DVT prophylaxis: SCDS Consultants: hematology, GI, ID, urology Procedures: RUQ drain  Objective: Filed Weights   07/01/16 0502 07/02/16 0654 07/03/16 0717  Weight: 59.9 kg (132 lb 1.6 oz) 61.2 kg (135 lb) 61.1 kg (134 lb 12.8 oz)    Intake/Output Summary (Last 24 hours) at 07/04/16 0843 Last data filed at 07/04/16 0500  Gross per 24 hour  Intake              605 ml  Output             1865 ml  Net             -1260 ml     Vitals Vitals:   07/03/16 0717 07/03/16 1350 07/03/16 2029 07/04/16 0500  BP: (!) 145/73 (!) 159/73 (!) 155/73 (!) 146/58  Pulse: (!) 102 94 99 (!) 103  Resp: 18 18 16 20   Temp: 98.2 F (36.8 C) 99.4 F (37.4 C) 99.1 F (37.3 C) 99.1 F (37.3 C)  TempSrc: Oral Oral Oral Oral  SpO2: 94% 96% 93% 97%  Weight: 61.1 kg (134 lb 12.8 oz)     Height:        Exam:  General:  Pt is alert, not in acute distress,  jaundice resolved  HEENT: resolved scleral icterus, No thrush, oral mucosa moist  Cardiovascular: regular rate and rhythm, S1/S2 No murmur  Respiratory: clear to auscultation bilaterally   Abdomen: Soft, +Bowel sounds, less distended, tenderness RUQ/RLQ with light palpation, - drain in place  MSK: No cyanosis or clubbing- 1+ pitting pedal edema   Data Reviewed: Basic Metabolic Panel:  Recent Labs Lab 06/29/16 0343 06/30/16 0342 07/01/16 0401 07/24/2016 0350 07/03/16 0518  NA 131* 130* 131* 133* 132*  K 4.3 3.8 4.5 3.5 4.2  CL 97* 95* 96* 97* 100*  CO2 29 28 28 29 26   GLUCOSE 95 106* 95 103* 98  BUN 14 16 16 14 12   CREATININE 0.57 0.58 0.47 0.44 0.43*  CALCIUM 7.8* 7.7* 7.8* 7.8* 8.0*  MG  --  2.2  --   --   --    Liver Function Tests:  Recent Labs Lab 06/29/16 0343 06/30/16 0342 07/01/16 0401 24-Jul-2016 0350 07/03/16 0518  AST 31 28 50* 29 34  ALT 46 41 51 42 34  ALKPHOS 185* 176* 176* 173* 169*  BILITOT 3.5* 4.6* 4.3* 3.6* 3.1*  PROT 5.2* 5.4* 5.5* 5.5* 5.8*  ALBUMIN 2.3* 2.4* 2.4* 2.4* 2.6*   No results for input(s): LIPASE, AMYLASE in the last 168 hours. No results for input(s): AMMONIA in the last 168 hours. CBC:  Recent Labs Lab 06/28/16 0311 06/29/16 0343 06/30/16 0342 07/24/16 0350  WBC 40.7* 34.2* 31.4* 23.1*  HGB 7.9* 7.8* 7.6* 6.6*  HCT 21.5* 21.7* 21.4* 18.8*  MCV 91.1 93.1 93.4 94.5  PLT 651* 694* 696* 746*   Cardiac Enzymes: No results for input(s): CKTOTAL, CKMB, CKMBINDEX, TROPONINI in the last 168  hours. BNP (last 3 results) No results for input(s): BNP in the last 8760 hours.  ProBNP (last 3 results) No results for input(s): PROBNP in the last 8760 hours.  CBG:  Recent Labs Lab 06/28/16 1201  GLUCAP 145*    No results found for this or any previous visit (from the past 240 hour(s)).   Studies: Ct Biopsy  Result Date: 2016/07/24 INDICATION: 82 year old female with anemia and leukocytosis. EXAM: CT GUIDED BONE MARROW ASPIRATION AND CORE BIOPSY Interventional Radiologist:  Criselda Peaches, MD MEDICATIONS: None. ANESTHESIA/SEDATION: Moderate (conscious) sedation was employed during this procedure. A total of 1.5 milligrams versed and 100 micrograms fentanyl were administered intravenously. The patient's level of consciousness and vital signs were monitored continuously by radiology nursing throughout the procedure under my direct supervision. Total monitored sedation time: 8 minutes FLUOROSCOPY TIME:  None COMPLICATIONS: None immediate. Estimated blood loss: <25 mL PROCEDURE: Informed written consent was obtained from the patient after a thorough discussion of the procedural risks, benefits and alternatives. All questions were addressed. Maximal Sterile Barrier Technique was utilized including caps, mask, sterile gowns, sterile gloves, sterile drape, hand hygiene and skin antiseptic. A timeout was performed prior to the initiation of the procedure. The patient was positioned prone and non-contrast localization CT was performed of the pelvis to demonstrate the iliac marrow spaces. Maximal barrier sterile technique utilized including caps, mask, sterile gowns, sterile gloves, large sterile drape, hand hygiene, and betadine prep. Under sterile conditions and local anesthesia, an 11 gauge coaxial bone biopsy needle was advanced into the left iliac marrow space. Needle position was confirmed with CT imaging. Initially, bone marrow aspiration was performed. Next, the 11 gauge outer cannula was  utilized to obtain a left iliac bone marrow core biopsy. Needle was removed. Hemostasis was obtained with compression. The patient  tolerated the procedure well. Samples were prepared with the cytotechnologist. IMPRESSION: Successful CT-guided bone marrow biopsy. Electronically Signed   By: [name] M.D.   On: 07/02/2016 11:54    Scheduled Meds:  Scheduled Meds: . sodium chloride   Intravenous Once  . ampicillin-sulbactam (UNASYN) IV  3 g Intravenous Q8H  . antiseptic oral rinse  7 mL Mouth Rinse BID  . conjugated estrogens  1 Applicatorful Vaginal Once per day on Mon Wed Fri  . diclofenac sodium  2 g Topical QID  . fluconazole  100 mg Oral Daily  . folic acid  1 mg Oral Daily  . metoprolol succinate  25 mg Oral Daily  . multivitamin with minerals  1 tablet Oral Daily  . nutrition supplement (JUVEN)  2 packet Oral BID BM  . polyethylene glycol  17 g Oral BID  . ranitidine  150 mg Oral BID  . simethicone  80 mg Oral QID  . sodium chloride flush  3 mL Intravenous Q12H  . sodium chloride  1 g Oral BID WC  . tamsulosin  0.4 mg Oral QPC breakfast   Continuous Infusions:   Time spent on care of this patient: 26 min  Signature  Bryanne Riquelme M.D on 07/04/2016 at 8:43 AM  Between 7am to 7pm - Pager - [phone], After 7pm go to [email or web address] - password [facility]  [facility]  - Office  [phone]

## 2016-07-04 NOTE — Clinical Social Work Note (Signed)
CSW left detailed message for patient's daughter Lenord Carbo [phone] regarding bed offers.  CSW awaits a return call and bed choice.  Weekday CSW to follow-up with daughter.  Nonnie Done, LCSW [phone] [facility] Weekend Coverage Licensed Clinical Social Worker

## 2016-07-05 DIAGNOSIS — R0902 Hypoxemia: Secondary | ICD-10-CM

## 2016-07-05 DIAGNOSIS — E875 Hyperkalemia: Secondary | ICD-10-CM

## 2016-07-05 LAB — CBC WITH DIFFERENTIAL/PLATELET
BASOS ABS: 0.2 10*3/uL — AB (ref 0.0–0.1)
Basophils Relative: 1 %
EOS PCT: 1 %
Eosinophils Absolute: 0.2 10*3/uL (ref 0.0–0.7)
HEMATOCRIT: 26.1 % — AB (ref 36.0–46.0)
Hemoglobin: 9 g/dL — ABNORMAL LOW (ref 12.0–15.0)
Lymphocytes Relative: 16 %
Lymphs Abs: 2.7 10*3/uL (ref 0.7–4.0)
MCH: 32.1 pg (ref 26.0–34.0)
MCHC: 34.5 g/dL (ref 30.0–36.0)
MCV: 93.2 fL (ref 78.0–100.0)
MONO ABS: 1.7 10*3/uL — AB (ref 0.1–1.0)
MONOS PCT: 10 %
NEUTROS PCT: 72 %
Neutro Abs: 12 10*3/uL — ABNORMAL HIGH (ref 1.7–7.7)
Platelets: 608 10*3/uL — ABNORMAL HIGH (ref 150–400)
RBC: 2.8 MIL/uL — AB (ref 3.87–5.11)
RDW: 19.9 % — AB (ref 11.5–15.5)
WBC: 16.8 10*3/uL — AB (ref 4.0–10.5)

## 2016-07-05 LAB — COMPREHENSIVE METABOLIC PANEL
ALBUMIN: 2.6 g/dL — AB (ref 3.5–5.0)
ALT: 25 U/L (ref 14–54)
ANION GAP: 6 (ref 5–15)
AST: 18 U/L (ref 15–41)
Alkaline Phosphatase: 144 U/L — ABNORMAL HIGH (ref 38–126)
BILIRUBIN TOTAL: 2.8 mg/dL — AB (ref 0.3–1.2)
BUN: 9 mg/dL (ref 6–20)
CHLORIDE: 101 mmol/L (ref 101–111)
CO2: 26 mmol/L (ref 22–32)
Calcium: 7.9 mg/dL — ABNORMAL LOW (ref 8.9–10.3)
Creatinine, Ser: 0.4 mg/dL — ABNORMAL LOW (ref 0.44–1.00)
GFR calc Af Amer: >60 mL/min (ref >60)
GFR calc non Af Amer: >60 mL/min (ref >60)
GLUCOSE: 97 mg/dL (ref 65–99)
POTASSIUM: 3.5 mmol/L (ref 3.5–5.1)
SODIUM: 133 mmol/L — AB (ref 135–145)
TOTAL PROTEIN: 5.7 g/dL — AB (ref 6.5–8.1)

## 2016-07-05 MED ORDER — DIPHENOXYLATE-ATROPINE 2.5-0.025 MG PO TABS: 2.0000 | ORAL_TABLET | Freq: Once | ORAL | Status: DC

## 2016-07-05 MED ORDER — DOCUSATE SODIUM 100 MG PO CAPS: 100.0000 mg | ORAL_CAPSULE | Freq: Two times a day (BID) | ORAL | Status: DC | PRN

## 2016-07-05 NOTE — Progress Notes (Signed)
IP PROGRESS NOTE  Subjective:    No new complaint. She was transfused with 2 units of red cells on 07/02/2016.  Objective: Vital signs in last 24 hours: Blood pressure (!) 144/66, pulse 81, temperature 98.5 F (36.9 C), temperature source Oral, resp. rate 18, height 5' 3.5" (1.613 m), weight 133 lb 11.2 oz (60.6 kg), SpO2 99 %.  Intake/Output from previous day: 07/23 0701 - 07/24 0700 In: 245 [P.O.:240] Out: 1660 [Urine:1650; Drains:10]  Physical Exam:  Abdomen: Serosanguineous fluid in the right abdominal drain Vascular: Trace pitting edema of the leg bilaterally   Lab Results:  Recent Labs  07/04/16 0638 07/05/16 0346  WBC 18.0* 16.8*  HGB 9.1* 9.0*  HCT 26.8* 26.1*  PLT PLATELET CLUMPS NOTED ON SMEAR, COUNT APPEARS INCREASED 608*    BMET  Recent Labs  07/03/16 0518 07/05/16 0346  NA 132* 133*  K 4.2 3.5  CL 100* 101  CO2 26 26  GLUCOSE 98 97  BUN 12 9  CREATININE 0.43* 0.40*  CALCIUM 8.0* 7.9*  AST 29, ALT 42, bilirubin 3.6 alk phosphatase 173   06/21/2016: IgM 1351, kappa free light chains 28, 0.8 mg/dL monoclonal protein, IgM kappa  Medications: I have reviewed the patient's current medications.  Assessment/Plan:   1. Fever, Abnormal liver enzymes, hyperbilirubinemia, hypodense liver lesions noted on CT 06/19/2016  Status post placement of a liver drain on 06/21/2010, New drain placed 06/30/2016 2. 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 High titer cold agglutinin, serum monoclonal IgM kappa protein  Bone marrow biopsy 07/02/2016-preliminary review 07/05/2016 revealed reactive myeloid changes, few interstitial lymphoid aggregates, no obvious lymphoproliferative disorder   She appears stable.   Recommendations: 1. Continue antibiotics per infectious disease 2. I will follow-up on the final bone marrow biopsy result and Outpatient follow-up will be scheduled in the hematology clinic 3. Continue folic acid at  discharge    LOS: 16 days   Betsy Coder, MD   07/05/2016, 2:24 PM

## 2016-07-05 NOTE — Progress Notes (Signed)
Nutrition Follow-up  DOCUMENTATION CODES:   Non-severe (moderate) malnutrition in context of acute illness/injury  INTERVENTION:  - Continue Juven as ordered until pt d/c.  NUTRITION DIAGNOSIS:   Inadequate oral intake related to poor appetite, nausea as evidenced by energy intake < 75% for > 7 days. -improving  GOAL:   Patient will meet greater than or equal to 90% of their needs -met on average over the past few days.  MONITOR:   PO intake, Supplement acceptance, Weight trends, Labs, Skin, I & O's    ASSESSMENT:   Brianna [name] is a 82 y.o. female with medical history significant of HTN, Thoracic vertebral fracture, who presents today with fevers, jaundice that started during last 24 hours. Patient relates that she was not feeling well on July 5, she pass out that day. She has been having ribs, chest pain since that day after she try to pull her self up. She was evaluated in the ED at that time , had negative CT and was discharge home.  7/24 Per review, pt has been consuming mainly 50-100% of meals since last RD visit. Pt reports her appetite remains fair but that she is very aware of need for adequate nutrition as she prepares for rehab; MD note this AM indicates pt likely to go to rehab tomorrow. Pt and daughter, who is at bedside, deny questions or concerns surrounding nutrition/diet at this time.  Pt meeting needs on average. Weight down 8 lbs since previous assessment; will continue to monitor.  Medications reviewed; 1 mg oral folic acid/day, daily multivitamin with minerals, PRN Zofran, 17 g Miralax BID, 1 g oral NaCl tablet BID. Labs reviewed; Na: 133 mmol/L, creatinine: 0.4 mg/dL, Ca: 7.9 mg/dL, Alk Phos elevated but trending down.     7/18 - Spoke with [name]'s daughter at bedside.  - She states pt is not consuming much today does not feel well, also experiencing nausea.  - Pt does not want Ensure anymore, was causing diarrhea - accepted one per day it  appears per med history. - It is also believed Boost Breeze was causing heartburn. - Pt does not want Magic Cup and did not like Pro-Stat. - Will try 2 packets Juven BID.    Diet Order:  Diet regular Room service appropriate?: Yes; Fluid consistency:: Thin  Skin:  Wound (see comment) (Mid back incision from 07/02/16)  Last BM:  7/23  Height:   Ht Readings from Last 1 Encounters:  06/19/16 5' 3.5" (1.613 m)    Weight:   Wt Readings from Last 1 Encounters:  07/05/16 133 lb 11.2 oz (60.6 kg)    Ideal Body Weight:  53.4 kg  BMI:  Body mass index is 23.31 kg/m.  Estimated Nutritional Needs:   Kcal:  1350-1550  Protein:  75-85 grams  Fluid:  1.3-1.5 L/day  EDUCATION NEEDS:   Education needs no appropriate at this time    Jarome Matin, MS, RD, LDN Inpatient Clinical Dietitian Pager # [phone] After hours/weekend pager # [phone]

## 2016-07-05 NOTE — Progress Notes (Signed)
[facility] Progress Note   Brianna [name]  XLK:[id number]  DOB: May 21, 1924  DOA: 06/19/2016 PCP: Tawanna Solo, MD  Brief narrative:  Brianna [name] is a 82 y.o. female with HTN, cold agglutinin disease presenting with temp of 103, jaundice and dark urine and weight loss of about 10 lbs. She had been feeling bad since 7/5 and was seen in the ER for syncope and discharged home. She returned to there ER after she was noted to be jaundiced. She was found to have elevated LFTS, WBC count of 40 and a CT scan showing right lobe hepatic abscesses. She also had a sodium of 125 and potassium of 5.7. Tylenol level was normal.   She is being followed by GI, oncology, ID and IR, she is on IV antibiotics per ID and has a right upper quadrant drain placed by IR. She also has issues with hemolytic anemia, she's been requiring transfusions, oncology is assisting in management of this problem.  Bone Marrow Biopsy was done by IR on 7/21.    Due to her liver abscess and poor appetite she has severe protein calorie malnutrition with third spacing of fluids which is being removed with gentle diuresis.  Pt to return to have new larger drain placed by IR 7/19.  Repeat CT 7/23 shows improvement in abscesses.   Subjective: Pt feeling better and motivated for going to SNF rehab tomorrow.         Assessment/Plan: Principal Problem:  Hepatic abscess causing sepsis Sepsis,  Acute liver failure with Elevated LFTs & jaundice - ID, GI and IR following. Sepsis resolved now.  She is currently on unasyn under the guidance of ID, right upper quadrant drain placed by IR on 06/21/2016. LFTs and total bilirubin have improved, question if she has had a past gallstone due to her hemolytic anemia causing disruption and bowel integrity and liver abscess. Will defer duration and route of total IV antibiotics to ID.  Pt s/p new larger drain 7/19 which is not draining much anymore.  WBC is trending down.  Pt is on unasyn IV per  ID likely to be discharged on augmentin tomorrow.   Repeated CT abd/pelv 7/23 shows improvement.   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 Oncology and following and assisting. There is question of underlying proliferative disorder. Bone marrow pathology pending.  Hemoglobin down to 6.6.  Transfused 2 units 7/21.  Hg stable at 9 post recent transfusion.    Leukocytosis - trending down. mostly neutrophils- suspecting possibly underlying proliferative disorder in addition to current infection, Heme/Onc ordered bone marrow biopsy done on 7/21.  Pathology pending.   Difficulty with urination - stable post-void residual.  Pt reports that she is having increasing difficulty with urination.   I spoke with Dr Alger Simons on phone and I ran the case by him and he suggested starting flomax.  Flomax started 7/22 and patient reporting improvement in symptoms.   Chronic Right mid back pain - Voltaren gel as needed seems to help.  Tylenol prn.  Hyponatremia/ pedal edema - sodium stable. Following.  Elevate extremities.   Hypoalbuminemia/ Moderate protein calorie malnutrition/ anasarca - due to abscesses and poor appetite, continue protein supplementation, appetite improving, continue gentle diuresis as permitted by blood pressure and renal function.  Hypoxia on exertion -  pulm edema due to third  spacing and fluid overload +  atelectasis due to being bed bound she was started on PO Lasix + IS, continue to gently diurese and monitor. Shortness of breath has considerably improved.   Generalized weakness and debility - pt has become deconditioned and will require SNF rehab.  Continue PT while in hospital.  OOB today.  Get up in chair.    Abdominal pain -   Improved.      Benign essential HTN - Metoprolol, add low dose  amlodipine  Antibiotics: Anti-infectives    Start     Dose/Rate Route Frequency Ordered Stop   06/30/16 1700  Ampicillin-Sulbactam (UNASYN) 3 g in sodium chloride 0.9 % 100 mL IVPB     3 g 100 mL/hr over 60 Minutes Intravenous Every 8 hours 06/30/16 1618     06/27/16 2300  piperacillin-tazobactam (ZOSYN) IVPB 3.375 g  Status:  Discontinued     3.375 g 12.5 mL/hr over 240 Minutes Intravenous Every 8 hours 06/27/16 1615 06/30/16 1618   06/27/16 1700  piperacillin-tazobactam (ZOSYN) IVPB 3.375 g     3.375 g 100 mL/hr over 30 Minutes Intravenous  Once 06/27/16 1615 06/27/16 1746   06/27/16 1200  fluconazole (DIFLUCAN) tablet 100 mg     100 mg Oral Daily 06/27/16 1111     06/25/16 0930  fluconazole (DIFLUCAN) IVPB 100 mg  Status:  Discontinued     100 mg 50 mL/hr over 60 Minutes Intravenous Every 24 hours 06/25/16 0841 06/27/16 1110   06/20/16 1400  vancomycin (VANCOCIN) 500 mg in sodium chloride 0.9 % 100 mL IVPB  Status:  Discontinued     500 mg 100 mL/hr over 60 Minutes Intravenous Every 24 hours 06/19/16 1254 06/23/16 1550   06/19/16 2200  piperacillin-tazobactam (ZOSYN) IVPB 3.375 g  Status:  Discontinued     3.375 g 12.5 mL/hr over 240 Minutes Intravenous Every 8 hours 06/19/16 1254 06/27/16 1615   06/19/16 1245  piperacillin-tazobactam (ZOSYN) IVPB 3.375 g     3.375 g 100 mL/hr over 30 Minutes Intravenous  Once 06/19/16 1242 06/19/16 1335   06/19/16 1245  vancomycin (VANCOCIN) IVPB 1000 mg/200 mL premix     1,000 mg 200 mL/hr over 60 Minutes Intravenous  Once 06/19/16 1242 06/19/16 1456     Code Status: DNR Family Communication: daughter bedside Disposition Plan: SNF rehab 7/25 [facility]  DVT prophylaxis: SCDS Consultants: hematology, GI, ID, urology Procedures: RUQ drain  Objective: Filed Weights   07/02/16 0654 07/03/16 0717 07/05/16 0157  Weight: 61.2 kg (135 lb) 61.1 kg (134 lb 12.8 oz) 60.6 kg (133 lb 11.2 oz)    Intake/Output Summary (Last 24 hours) at 07/05/16  1005 Last data filed at 07/05/16 0559  Gross per 24 hour  Intake              245 ml  Output             1660 ml  Net            -1415 ml     Vitals Vitals:   07/04/16 1301 07/04/16 2035 07/05/16 0157 07/05/16 0507  BP: (!) 144/70 (!) 149/70  (!) 144/65  Pulse: 84 89  89  Resp: _0 Temp: 97.5 F (36.4 C) 98.6 F (37 C)  98.2 F (36.8 C)  TempSrc: Oral Oral  Oral  SpO2: 98% 95%  94%  Weight:   60.6 kg (133 lb 11.2 oz)   Height:        Exam:  General:  Pt is alert, not in acute distress, jaundice resolved  HEENT: resolved scleral icterus, No thrush, oral mucosa moist  Cardiovascular: regular rate and rhythm, S1/S2 No murmur  Respiratory: clear to auscultation bilaterally   Abdomen: Soft, +Bowel sounds, less distended, tenderness RUQ/RLQ with light palpation, - drain in place  MSK: No cyanosis or clubbing- 1+ pitting pedal edema   Data Reviewed: Basic Metabolic Panel:  Recent Labs Lab 06/30/16 0342 07/01/16 0401 07/02/16 0350 07/03/16 0518 07/05/16 0346  NA 130* 131* 133* 132* 133*  K 3.8 4.5 3.5 4.2 3.5  CL 95* 96* 97* 100* 101  CO2 _0 GLUCOSE 106* 95 103* 98 97  BUN _1 CREATININE 0.58 0.47 0.44 0.43* 0.40*  CALCIUM 7.7* 7.8* 7.8* 8.0* 7.9*  MG 2.2  --   --   --   --    Liver Function Tests:  Recent Labs Lab 06/30/16 0342 07/01/16 0401 07/02/16 0350 07/03/16 0518 07/05/16 0346  AST 28 50* 29 34 18  ALT 41 51 42 34 25  ALKPHOS 176* 176* 173* 169* 144*  BILITOT 4.6* 4.3* 3.6* 3.1* 2.8*  PROT 5.4* 5.5* 5.5* 5.8* 5.7*  ALBUMIN 2.4* 2.4* 2.4* 2.6* 2.6*   No results for input(s): LIPASE, AMYLASE in the last 168 hours. No results for input(s): AMMONIA in the last 168 hours. CBC:  Recent Labs Lab 06/29/16 0343 06/30/16 0342 07/02/16 0350 07/04/16 0638 07/05/16 0346  WBC 34.2* 31.4* 23.1* 18.0* 16.8*  NEUTROABS  --   --   --   --  12.0*  HGB 7.8* 7.6* 6.6* 9.1* 9.0*  HCT 21.7* 21.4* 18.8* 26.8* 26.1*  MCV  93.1 93.4 94.5 93.4 93.2  PLT 694* 696* 746* PLATELET CLUMPS NOTED ON SMEAR, COUNT APPEARS INCREASED 608*   Cardiac Enzymes: No results for input(s): CKTOTAL, CKMB, CKMBINDEX, TROPONINI in the last 168 hours. BNP (last 3 results) No results for input(s): BNP in the last 8760 hours.  ProBNP (last 3 results) No results for input(s): PROBNP in the last 8760 hours.  CBG:  Recent Labs Lab 06/28/16 1201  GLUCAP 145*    No results found for this or any previous visit (from the past 240 hour(s)).   Studies: Ct Abdomen Pelvis W Contrast  Result Date: 07/04/2016 CLINICAL DATA:  Follow-up known liver abscesses. EXAM: CT ABDOMEN AND PELVIS WITH CONTRAST TECHNIQUE: Multidetector CT imaging of the abdomen and pelvis was performed using the standard protocol following bolus administration of intravenous contrast. CONTRAST:  161m ISOVUE-300 IOPAMIDOL (ISOVUE-300) INJECTION 61% COMPARISON:  June 19, 2016 and June 25, 2016 FINDINGS: Bilateral pleural effusions and atelectasis are stable since June 25, 2016. The lung bases are otherwise unchanged. No free air. Free fluid in the pelvis is increased in the interval. There is rim enhancement around a pocket of fluid in the right pelvis on series 3, image 61, measuring 3 by 1.6 cm. No abnormal fluid otherwise seen in the abdomen. The percutaneous drainage catheter remains in the liver. The pigtail is now within the more posterior abscess. This abscess is smaller in the interval measuring 3.3 x 3.5 cm today versus 4.8 by 4.2 cm previously. The more peripheral abscess is also being appropriately drained and is also smaller in the interval. There is a track of low attenuation extending from the more peripheral abscess into the posterior right hepatic lobe which was not seen previously. There is also a small region of low attenuation in the hepatic  dome on axial image 11, not clearly seen previously. The pigtail catheter originally ended in this region of the medial  hepatic dome on the June 25, 2016 study. Periportal edema remains. The portal vein is patent. A subcapsular hepatic collection on series 3, image 25 remains measuring 3.8 by 1.1 cm today versus 6.7 by 1.1 cm previously. The gallbladder is distended to a similar degree compared to June 25, 2016. The spleen and adrenal glands are normal. An indeterminate low-attenuation lesion measuring 17 mm in the left kidney on image 28 is stable. Several renal cysts are noted. Three low-attenuation rounded regions of the pancreas are stable. Atherosclerosis is seen in the abdominal aorta. There is a small hiatal hernia. The stomach is fluid filled and mildly distended but there is no convincing evidence of outlet obstruction. The small bowel is normal. The colon is normal. There is no secondary evidence of appendicitis. Free fluid in the pelvis. Rim enhancing fluid to collection identified in the right posterior pelvis. No adenopathy or mass. No other pelvic changes. Significant subcutaneous edema remains but has improved in the interval. Delayed images demonstrate no filling defects in the upper renal collecting systems. Significant off loss of height of T11, unchanged. Mild loss of height of L1 is also unchanged. IMPRESSION: 1. The previously seen abscesses in the liver are smaller in the interval. There is a small region of low attenuation near the medial hepatic dome which was the site of the pigtail catheter on the previous study. This could be postprocedural. It would be difficult to exclude a developing abscess in this region. Recommend attention on follow-up. There is also a track of low attenuation extending from the more anterior superficial abscess into the posterior hepatic dome. Extension of infection is not excluded but the track like appearance raises the possibility of postprocedural change. Recommend attention on followup. 2. Rim enhancing collection of fluid in the right posterior pelvis concerning for developing  abscess. 3. The subcapsular fluid collection over the right hepatic lobe is smaller in the interval. 4. Stable indeterminate lesion in the left kidney. 5. Stable low-attenuation lesions in the pancreas. 6. Stable compression fractures in the spine. Electronically Signed   By: Dorise Bullion III M.D   On: 07/04/2016 12:13   Scheduled Meds:  Scheduled Meds: . sodium chloride   Intravenous Once  . ampicillin-sulbactam (UNASYN) IV  3 g Intravenous Q8H  . antiseptic oral rinse  7 mL Mouth Rinse BID  . conjugated estrogens  1 Applicatorful Vaginal Once per day on Mon Wed Fri  . diclofenac sodium  2 g Topical QID  . fluconazole  100 mg Oral Daily  . folic acid  1 mg Oral Daily  . metoprolol succinate  25 mg Oral Daily  . multivitamin with minerals  1 tablet Oral Daily  . nutrition supplement (JUVEN)  2 packet Oral BID BM  . polyethylene glycol  17 g Oral BID  . ranitidine  150 mg Oral BID  . saccharomyces boulardii  250 mg Oral BID  . simethicone  80 mg Oral QID  . sodium chloride flush  3 mL Intravenous Q12H  . sodium chloride  1 g Oral BID WC  . tamsulosin  0.4 mg Oral QPC breakfast   Continuous Infusions:   Time spent on care of this patient: 23 min  Signature  Clanford Johnson M.D on 07/05/2016 at 10:05 AM  Between 7am to 7pm - Pager - [phone], After 7pm go to [email or web address] - password [email or web address]  [facility]  - Office  [phone]

## 2016-07-05 NOTE — Progress Notes (Signed)
INFECTIOUS DISEASE PROGRESS NOTE  ID: Brianna [name] is a 82 y.o. female with  Principal Problem:   Hepatic abscess Active Problems:   Spinal stenosis of lumbar region   Benign essential HTN   Sepsis ([facility])   Hyponatremia   Hyperkalemia   Jaundice   Elevated LFTs   Malnutrition of moderate degree   Hypoxia   Liver abscess   Hemolytic anemia ([facility])   Cold agglutinin disease ([facility])   IgM monoclonal gammopathy of uncertain significance   Leukocytosis   Anemia  Subjective: Feels better, denies pain  Abtx:  Anti-infectives    Start     Dose/Rate Route Frequency Ordered Stop   06/30/16 1700  Ampicillin-Sulbactam (UNASYN) 3 g in sodium chloride 0.9 % 100 mL IVPB     3 g 100 mL/hr over 60 Minutes Intravenous Every 8 hours 06/30/16 1618     06/27/16 2300  piperacillin-tazobactam (ZOSYN) IVPB 3.375 g  Status:  Discontinued     3.375 g 12.5 mL/hr over 240 Minutes Intravenous Every 8 hours 06/27/16 1615 06/30/16 1618   06/27/16 1700  piperacillin-tazobactam (ZOSYN) IVPB 3.375 g     3.375 g 100 mL/hr over 30 Minutes Intravenous  Once 06/27/16 1615 06/27/16 1746   06/27/16 1200  fluconazole (DIFLUCAN) tablet 100 mg     100 mg Oral Daily 06/27/16 1111     06/25/16 0930  fluconazole (DIFLUCAN) IVPB 100 mg  Status:  Discontinued     100 mg 50 mL/hr over 60 Minutes Intravenous Every 24 hours 06/25/16 0841 06/27/16 1110   06/20/16 1400  vancomycin (VANCOCIN) 500 mg in sodium chloride 0.9 % 100 mL IVPB  Status:  Discontinued     500 mg 100 mL/hr over 60 Minutes Intravenous Every 24 hours 06/19/16 1254 06/23/16 1550   06/19/16 2200  piperacillin-tazobactam (ZOSYN) IVPB 3.375 g  Status:  Discontinued     3.375 g 12.5 mL/hr over 240 Minutes Intravenous Every 8 hours 06/19/16 1254 06/27/16 1615   06/19/16 1245  piperacillin-tazobactam (ZOSYN) IVPB 3.375 g     3.375 g 100 mL/hr over 30 Minutes Intravenous  Once 06/19/16 1242 06/19/16 1335   06/19/16 1245  vancomycin (VANCOCIN) IVPB 1000  mg/200 mL premix     1,000 mg 200 mL/hr over 60 Minutes Intravenous  Once 06/19/16 1242 06/19/16 1456      Medications:  Scheduled: . sodium chloride   Intravenous Once  . ampicillin-sulbactam (UNASYN) IV  3 g Intravenous Q8H  . antiseptic oral rinse  7 mL Mouth Rinse BID  . conjugated estrogens  1 Applicatorful Vaginal Once per day on Mon Wed Fri  . diclofenac sodium  2 g Topical QID  . fluconazole  100 mg Oral Daily  . folic acid  1 mg Oral Daily  . metoprolol succinate  25 mg Oral Daily  . multivitamin with minerals  1 tablet Oral Daily  . nutrition supplement (JUVEN)  2 packet Oral BID BM  . polyethylene glycol  17 g Oral BID  . ranitidine  150 mg Oral BID  . saccharomyces boulardii  250 mg Oral BID  . simethicone  80 mg Oral QID  . sodium chloride flush  3 mL Intravenous Q12H  . sodium chloride  1 g Oral BID WC  . tamsulosin  0.4 mg Oral QPC breakfast    Objective: Vital signs in last 24 hours: Temp:  [98.2 F (36.8 C)-98.6 F (37 C)] 98.5 F (36.9 C) (07/24 1341) Pulse Rate:  [81-89] 81 (07/24 1341)  Resp:  [16-18] 18 (07/24 1341) BP: (128-149)/(62-70) 144/66 (07/24 1341) SpO2:  [94 %-99 %] 99 % (07/24 1341) Weight:  [60.6 kg (133 lb 11.2 oz)] 60.6 kg (133 lb 11.2 oz) (07/24 0157)   General appearance: alert, cooperative and no distress Resp: clear to auscultation bilaterally Cardio: regular rate and rhythm GI: normal findings: bowel sounds normal and soft, non-tender and RMQ drain in place.   Lab Results  Recent Labs  07/03/16 0518 07/04/16 0638 07/05/16 0346  WBC  --  18.0* 16.8*  HGB  --  9.1* 9.0*  HCT  --  26.8* 26.1*  NA 132*  --  133*  K 4.2  --  3.5  CL 100*  --  101  CO2 26  --  26  BUN 12  --  9  CREATININE 0.43*  --  0.40*   Liver Panel  Recent Labs  07/03/16 0518 07/05/16 0346  PROT 5.8* 5.7*  ALBUMIN 2.6* 2.6*  AST 34 18  ALT 34 25  ALKPHOS 169* 144*  BILITOT 3.1* 2.8*   Sedimentation Rate No results for input(s):  ESRSEDRATE in the last 72 hours. C-Reactive Protein No results for input(s): CRP in the last 72 hours.  Microbiology: No results found for this or any previous visit (from the past 240 hour(s)).  Studies/Results: Ct Abdomen Pelvis W Contrast  Result Date: 07/04/2016 CLINICAL DATA:  Follow-up known liver abscesses. EXAM: CT ABDOMEN AND PELVIS WITH CONTRAST TECHNIQUE: Multidetector CT imaging of the abdomen and pelvis was performed using the standard protocol following bolus administration of intravenous contrast. CONTRAST:  143mL ISOVUE-300 IOPAMIDOL (ISOVUE-300) INJECTION 61% COMPARISON:  June 19, 2016 and June 25, 2016 FINDINGS: Bilateral pleural effusions and atelectasis are stable since June 25, 2016. The lung bases are otherwise unchanged. No free air. Free fluid in the pelvis is increased in the interval. There is rim enhancement around a pocket of fluid in the right pelvis on series 3, image 61, measuring 3 by 1.6 cm. No abnormal fluid otherwise seen in the abdomen. The percutaneous drainage catheter remains in the liver. The pigtail is now within the more posterior abscess. This abscess is smaller in the interval measuring 3.3 x 3.5 cm today versus 4.8 by 4.2 cm previously. The more peripheral abscess is also being appropriately drained and is also smaller in the interval. There is a track of low attenuation extending from the more peripheral abscess into the posterior right hepatic lobe which was not seen previously. There is also a small region of low attenuation in the hepatic dome on axial image 11, not clearly seen previously. The pigtail catheter originally ended in this region of the medial hepatic dome on the June 25, 2016 study. Periportal edema remains. The portal vein is patent. A subcapsular hepatic collection on series 3, image 25 remains measuring 3.8 by 1.1 cm today versus 6.7 by 1.1 cm previously. The gallbladder is distended to a similar degree compared to June 25, 2016. The spleen  and adrenal glands are normal. An indeterminate low-attenuation lesion measuring 17 mm in the left kidney on image 28 is stable. Several renal cysts are noted. Three low-attenuation rounded regions of the pancreas are stable. Atherosclerosis is seen in the abdominal aorta. There is a small hiatal hernia. The stomach is fluid filled and mildly distended but there is no convincing evidence of outlet obstruction. The small bowel is normal. The colon is normal. There is no secondary evidence of appendicitis. Free fluid in the pelvis. Rim enhancing  fluid to collection identified in the right posterior pelvis. No adenopathy or mass. No other pelvic changes. Significant subcutaneous edema remains but has improved in the interval. Delayed images demonstrate no filling defects in the upper renal collecting systems. Significant off loss of height of T11, unchanged. Mild loss of height of L1 is also unchanged. IMPRESSION: 1. The previously seen abscesses in the liver are smaller in the interval. There is a small region of low attenuation near the medial hepatic dome which was the site of the pigtail catheter on the previous study. This could be postprocedural. It would be difficult to exclude a developing abscess in this region. Recommend attention on follow-up. There is also a track of low attenuation extending from the more anterior superficial abscess into the posterior hepatic dome. Extension of infection is not excluded but the track like appearance raises the possibility of postprocedural change. Recommend attention on followup. 2. Rim enhancing collection of fluid in the right posterior pelvis concerning for developing abscess. 3. The subcapsular fluid collection over the right hepatic lobe is smaller in the interval. 4. Stable indeterminate lesion in the left kidney. 5. Stable low-attenuation lesions in the pancreas. 6. Stable compression fractures in the spine. Electronically Signed   By: Dorise Bullion III M.D   On:  07/04/2016 12:13    Assessment/Plan: Liver abscesses Her repeat CT scan (7-23) is improved although these is concern for new abscess in pelvis now. Drain exchanged 7-19 Her WBC continues to improve, she is afebrile.  Drain out 10 last 24h.  Cx- "anaerobe, beta-lactamase negative" is all the info the lab has. (fusobacterium> bacteroides). Will change her to unasyn. Consider augmentin at d/c for at least 2 weeks. Length of therapy determined by f/u CT amoeba serologies negative Continue PT  Diverticulitis? Pt relates LLQ pain 2 weeks PTA  Urinary hesitancy  Fluid overload  Cold Agglutinin anemia Bone marrow bx shows no obvious lymphoproliferative d/o  Protein Calorie Malnutrition Nutrition f/u Alb 2.6  Total days of antibiotics: 10 zosyn --> 5 unasyn  Available as needed         Bobby Rumpf Infectious Diseases (pager) [phone] [email or web address] 07/05/2016, 3:34 PM  LOS: 16 days

## 2016-07-05 NOTE — Progress Notes (Signed)
Physical Therapy Treatment Patient Details Name: Brianna [name] MRN: [id number] DOB: [birth year] Today's Date: 07/05/2016    History of Present Illness 82 y/o female admitted 7/8 with sepsis with jaundice due to hepatic abscess (s/p perc drain on 7/10, upsized on 7/19).  underwent bone marrow biopsy 7/21.  PMH includes spinal stenosis, thoracic vertebral fx    PT Comments    Pt able to tolerate increased activity on today. She is still weak and fatigues easily. Continue to recommend SNF.   Follow Up Recommendations  SNF     Equipment Recommendations  None recommended by PT    Recommendations for Other Services       Precautions / Restrictions Precautions Precautions: Fall Precaution Comments: R perc drain Restrictions Weight Bearing Restrictions: No    Mobility  Bed Mobility Overal bed mobility: Needs Assistance Bed Mobility: Supine to Sit;Sit to Supine     Supine to sit: HOB elevated;Min assist Sit to supine: HOB elevated;Min guard   General bed mobility comments: Assist for trunk. Increased time.   Transfers Overall transfer level: Needs assistance Equipment used: Rolling walker (2 wheeled) Transfers: Sit to/from Stand Sit to Stand: Min guard         General transfer comment: verbal cues for hand placement and close guard for safety  Ambulation/Gait Ambulation/Gait assistance: Min assist Ambulation Distance (Feet): 40 Feet Assistive device: Rolling walker (2 wheeled) Gait Pattern/deviations: Step-through pattern;Decreased stride length;Trunk flexed     General Gait Details: Assist to stabilize throughout ambulation distance. Pt fatigues easily. slow gait speed.    Stairs            Wheelchair Mobility    Modified Rankin (Stroke Patients Only)       Balance                                    Cognition Arousal/Alertness: Awake/alert Behavior During Therapy: WFL for tasks assessed/performed Overall Cognitive Status: Within  Functional Limits for tasks assessed                      Exercises      General Comments        Pertinent Vitals/Pain Pain Assessment: No/denies pain    Home Living                      Prior Function            PT Goals (current goals can now be found in the care plan section) Progress towards PT goals: Progressing toward goals (slowly)    Frequency  Min 3X/week    PT Plan Current plan remains appropriate    Co-evaluation             End of Session Equipment Utilized During Treatment: Gait belt Activity Tolerance: Patient tolerated treatment well Patient left: in bed;with call bell/phone within reach;with bed alarm set     Time: 6270-3500 PT Time Calculation (min) (ACUTE ONLY): 22 min  Charges:  $Gait Training: 8-22 mins                    G Codes:      Weston Anna, MPT Pager: [phone]

## 2016-07-06 ENCOUNTER — Other Ambulatory Visit: Payer: Self-pay | Admitting: Radiology

## 2016-07-06 ENCOUNTER — Other Ambulatory Visit: Payer: Self-pay | Admitting: Oncology

## 2016-07-06 ENCOUNTER — Other Ambulatory Visit: Payer: Self-pay | Admitting: *Deleted

## 2016-07-06 DIAGNOSIS — M810 Age-related osteoporosis without current pathological fracture: Secondary | ICD-10-CM

## 2016-07-06 DIAGNOSIS — K750 Abscess of liver: Secondary | ICD-10-CM

## 2016-07-06 DIAGNOSIS — D5919 Other autoimmune hemolytic anemia: Secondary | ICD-10-CM

## 2016-07-06 DIAGNOSIS — D591 Other autoimmune hemolytic anemias: Secondary | ICD-10-CM

## 2016-07-06 DIAGNOSIS — M4806 Spinal stenosis, lumbar region: Secondary | ICD-10-CM

## 2016-07-06 DIAGNOSIS — D5912 Cold autoimmune hemolytic anemia: Secondary | ICD-10-CM

## 2016-07-06 MED ORDER — ALPRAZOLAM 0.5 MG PO TABS: 0.5000 mg | ORAL_TABLET | Freq: Every evening | ORAL | 0 refills | Status: DC | PRN

## 2016-07-06 MED ORDER — ONDANSETRON HCL 4 MG PO TABS: 4.0000 mg | ORAL_TABLET | Freq: Three times a day (TID) | ORAL | 0 refills | Status: DC | PRN

## 2016-07-06 MED ORDER — DICLOFENAC SODIUM 1 % TD GEL: 2.0000 g | Freq: Four times a day (QID) | TRANSDERMAL | 0 refills | Status: DC

## 2016-07-06 MED ORDER — ACETAMINOPHEN 500 MG PO TABS: 500.0000 mg | ORAL_TABLET | Freq: Four times a day (QID) | ORAL | 0 refills | Status: DC | PRN

## 2016-07-06 MED ORDER — AMOXICILLIN-POT CLAVULANATE 875-125 MG PO TABS: 1.0000 | ORAL_TABLET | Freq: Two times a day (BID) | ORAL | 0 refills | Status: AC

## 2016-07-06 MED ORDER — JUVEN PO PACK: 2.0000 | PACK | Freq: Two times a day (BID) | ORAL | 0 refills | Status: DC

## 2016-07-06 MED ORDER — FOLIC ACID 1 MG PO TABS: 1.0000 mg | ORAL_TABLET | Freq: Every day | ORAL | 0 refills | Status: DC

## 2016-07-06 MED ORDER — TAMSULOSIN HCL 0.4 MG PO CAPS: 0.4000 mg | ORAL_CAPSULE | Freq: Every day | ORAL | 0 refills | Status: DC

## 2016-07-06 NOTE — Progress Notes (Signed)
Patient is set to discharge to [facility] today. Patient & daughter, Rodena Piety, aware. Discharge packet given to RN, Festus Holts. [facility] called for transport.   Kingsley Spittle, [place] Clinical Social Worker [phone]

## 2016-07-06 NOTE — Progress Notes (Signed)
Report called to Chesley Mires at [facility]. Patient to be transported via [facility] to the facility.

## 2016-07-06 NOTE — Progress Notes (Signed)
Patient ID: Brianna [name], female   DOB: [birth year], 82 y.o.   MRN: [id number]    Referring Physician(s): Perry,J  Supervising Physician: Aletta Edouard  Patient Status:  Inpatient  Chief Complaint:  Hepatic abscesses  Subjective:  Pt feeling better; awaiting d/c to rehab facility today  Allergies: Other; Compazine [prochlorperazine]; Erythromycin; Lyrica [pregabalin]; Promethazine; Tramadol; and Vistaril [hydroxyzine hcl]  Medications: Prior to Admission medications   Medication Sig Start Date End Date Taking? Authorizing Provider  conjugated estrogens (PREMARIN) vaginal cream Place 1 Applicatorful vaginally 3 (three) times a week.   Yes Historical Provider, MD  diphenhydrAMINE (BENADRYL) 25 MG tablet Take 25 mg by mouth at bedtime as needed for sleep.    Yes Historical Provider, MD  famotidine (PEPCID) 10 MG tablet Take 10 mg by mouth daily as needed for heartburn or indigestion.   Yes Historical Provider, MD  ibuprofen (ADVIL,MOTRIN) 200 MG tablet Take 400 mg by mouth every 6 (six) hours as needed for fever, headache, mild pain, moderate pain or cramping.   Yes Historical Provider, MD  Melatonin 5 MG TABS Take 5 mg by mouth at bedtime as needed (sleep).   Yes Historical Provider, MD  metoprolol succinate (TOPROL-XL) 25 MG 24 hr tablet Take 1 tablet (25 mg total) by mouth daily. 12/13/13  Yes Robbie Lis, MD  Multiple Vitamin (MULTIVITAMIN WITH MINERALS) TABS tablet Take 1 tablet by mouth daily.   Yes Historical Provider, MD  Polyethyl Glycol-Propyl Glycol (SYSTANE OP) Apply 2 drops to eye 3 (three) times daily as needed (dry eyes).   Yes Historical Provider, MD  acetaminophen (TYLENOL) 500 MG tablet Take 1 tablet (500 mg total) by mouth every 6 (six) hours as needed for mild pain, moderate pain, fever or headache. 07/06/16   Clanford Marisa Hua, MD  ALPRAZolam Duanne Moron) 0.5 MG tablet Take 1 tablet (0.5 mg total) by mouth at bedtime as needed for anxiety. 07/06/16   Clanford Marisa Hua, MD  amoxicillin-clavulanate (AUGMENTIN) 875-125 MG tablet Take 1 tablet by mouth 2 (two) times daily. 07/06/16 07/20/16  Clanford Marisa Hua, MD  diclofenac sodium (VOLTAREN) 1 % GEL Apply 2 g topically 4 (four) times daily. 07/06/16   Clanford Marisa Hua, MD  folic acid (FOLVITE) 1 MG tablet Take 1 tablet (1 mg total) by mouth daily. 07/06/16   Clanford Marisa Hua, MD  ibuprofen (ADVIL,MOTRIN) 800 MG tablet Take 0.5 tablets (400 mg total) by mouth 3 (three) times daily. Patient not taking: Reported on 06/19/2016 06/16/16   [name], MD  nutrition supplement, JUVEN, (JUVEN) PACK Take 2 packets by mouth 2 (two) times daily between meals. 07/06/16   Clanford Marisa Hua, MD  ondansetron (ZOFRAN) 4 MG tablet Take 1 tablet (4 mg total) by mouth every 8 (eight) hours as needed for nausea or vomiting. 07/06/16   Clanford Marisa Hua, MD  tamsulosin (FLOMAX) 0.4 MG CAPS capsule Take 1 capsule (0.4 mg total) by mouth daily after breakfast. 07/06/16   Clanford Marisa Hua, MD     Vital Signs: BP (!) 145/68 (BP Location: Right Arm)   Pulse 94   Temp 98.3 F (36.8 C) (Oral)   Resp 18   Ht 5' 3.5" (1.613 m)   Wt 135 lb 11.2 oz (61.6 kg)   SpO2 97%   BMI 23.66 kg/m   Physical Exam awake/alert; Right upper quadrant drain intact, insertion site clean and dry, mildly tender to palpation; small amount of bloody fluid in JP bulb  Imaging: Ct Abdomen Pelvis W Contrast  Result Date: 07/04/2016 CLINICAL DATA:  Follow-up known liver abscesses. EXAM: CT ABDOMEN AND PELVIS WITH CONTRAST TECHNIQUE: Multidetector CT imaging of the abdomen and pelvis was performed using the standard protocol following bolus administration of intravenous contrast. CONTRAST:  172mL ISOVUE-300 IOPAMIDOL (ISOVUE-300) INJECTION 61% COMPARISON:  June 19, 2016 and June 25, 2016 FINDINGS: Bilateral pleural effusions and atelectasis are stable since June 25, 2016. The lung bases are otherwise unchanged. No free air. Free fluid in the pelvis is  increased in the interval. There is rim enhancement around a pocket of fluid in the right pelvis on series 3, image 61, measuring 3 by 1.6 cm. No abnormal fluid otherwise seen in the abdomen. The percutaneous drainage catheter remains in the liver. The pigtail is now within the more posterior abscess. This abscess is smaller in the interval measuring 3.3 x 3.5 cm today versus 4.8 by 4.2 cm previously. The more peripheral abscess is also being appropriately drained and is also smaller in the interval. There is a track of low attenuation extending from the more peripheral abscess into the posterior right hepatic lobe which was not seen previously. There is also a small region of low attenuation in the hepatic dome on axial image 11, not clearly seen previously. The pigtail catheter originally ended in this region of the medial hepatic dome on the June 25, 2016 study. Periportal edema remains. The portal vein is patent. A subcapsular hepatic collection on series 3, image 25 remains measuring 3.8 by 1.1 cm today versus 6.7 by 1.1 cm previously. The gallbladder is distended to a similar degree compared to June 25, 2016. The spleen and adrenal glands are normal. An indeterminate low-attenuation lesion measuring 17 mm in the left kidney on image 28 is stable. Several renal cysts are noted. Three low-attenuation rounded regions of the pancreas are stable. Atherosclerosis is seen in the abdominal aorta. There is a small hiatal hernia. The stomach is fluid filled and mildly distended but there is no convincing evidence of outlet obstruction. The small bowel is normal. The colon is normal. There is no secondary evidence of appendicitis. Free fluid in the pelvis. Rim enhancing fluid to collection identified in the right posterior pelvis. No adenopathy or mass. No other pelvic changes. Significant subcutaneous edema remains but has improved in the interval. Delayed images demonstrate no filling defects in the upper renal  collecting systems. Significant off loss of height of T11, unchanged. Mild loss of height of L1 is also unchanged. IMPRESSION: 1. The previously seen abscesses in the liver are smaller in the interval. There is a small region of low attenuation near the medial hepatic dome which was the site of the pigtail catheter on the previous study. This could be postprocedural. It would be difficult to exclude a developing abscess in this region. Recommend attention on follow-up. There is also a track of low attenuation extending from the more anterior superficial abscess into the posterior hepatic dome. Extension of infection is not excluded but the track like appearance raises the possibility of postprocedural change. Recommend attention on followup. 2. Rim enhancing collection of fluid in the right posterior pelvis concerning for developing abscess. 3. The subcapsular fluid collection over the right hepatic lobe is smaller in the interval. 4. Stable indeterminate lesion in the left kidney. 5. Stable low-attenuation lesions in the pancreas. 6. Stable compression fractures in the spine. Electronically Signed   By: Dorise Bullion III M.D   On: 07/04/2016 12:13   Labs:  CBC:  Recent Labs  06/30/16 0342 07/02/16 0350 07/04/16 0638 07/05/16 0346  WBC 31.4* 23.1* 18.0* 16.8*  HGB 7.6* 6.6* 9.1* 9.0*  HCT 21.4* 18.8* 26.8* 26.1*  PLT 696* 746* PLATELET CLUMPS NOTED ON SMEAR, COUNT APPEARS INCREASED 608*    COAGS:  Recent Labs  06/20/16 0231 06/20/16 1330  INR 1.31 1.22    BMP:  Recent Labs  07/01/16 0401 07/02/16 0350 07/03/16 0518 07/05/16 0346  NA 131* 133* 132* 133*  K 4.5 3.5 4.2 3.5  CL 96* 97* 100* 101  CO2 28 29 26 26   GLUCOSE 95 103* 98 97  BUN 16 14 12 9   CALCIUM 7.8* 7.8* 8.0* 7.9*  CREATININE 0.47 0.44 0.43* 0.40*  GFRNONAA >60 >60 >60 >60  GFRAA >60 >60 >60 >60    LIVER FUNCTION TESTS:  Recent Labs  07/01/16 0401 07/02/16 0350 07/03/16 0518 07/05/16 0346  BILITOT  4.3* 3.6* 3.1* 2.8*  AST 50* 29 34 18  ALT 51 42 34 25  ALKPHOS 176* 173* 169* 144*  PROT 5.5* 5.5* 5.8* 5.7*  ALBUMIN 2.4* 2.4* 2.6* 2.6*    Assessment and Plan: Patient with history of hepatic abscesses, status post drainage on 7/10 with exchange on 7/19; afebrile; total bilirubin 2.8; creatinine 0.4; WBC 16.8 down from 18, hemoglobin 9.0; recent follow-up CT scan reveals improvement in size of abscesses. As outpatient recommend once daily irrigation of drain with 5 -10 mL sterile normal saline, recording of drain output and dressing changes every 1-2 days. Will schedule patient for follow-up CT at our IR drain clinic in 2-4 weeks.   Electronically Signed: D. Rowe Robert 07/06/2016, 10:48 AM   I spent a total of 15 minutes at the the patient's bedside AND on the patient's hospital floor or unit, greater than 50% of which was counseling/coordinating care for hepatic abscess drain

## 2016-07-06 NOTE — Discharge Instructions (Signed)
Abscess An abscess is an infected area that contains a collection of pus and debris.It can occur in almost any part of the body. An abscess is also known as a furuncle or boil. CAUSES  An abscess occurs when tissue gets infected. This can occur from blockage of oil or sweat glands, infection of hair follicles, or a minor injury to the skin. As the body tries to fight the infection, pus collects in the area and creates pressure under the skin. This pressure causes pain. People with weakened immune systems have difficulty fighting infections and get certain abscesses more often.  SYMPTOMS Usually an abscess develops on the skin and becomes a painful mass that is red, warm, and tender. If the abscess forms under the skin, you may feel a moveable soft area under the skin. Some abscesses break open (rupture) on their own, but most will continue to get worse without care. The infection can spread deeper into the body and eventually into the bloodstream, causing you to feel ill.  DIAGNOSIS  Your caregiver will take your medical history and perform a physical exam. A sample of fluid may also be taken from the abscess to determine what is causing your infection. TREATMENT  Your caregiver may prescribe antibiotic medicines to fight the infection. However, taking antibiotics alone usually does not cure an abscess. Your caregiver may need to make a small cut (incision) in the abscess to drain the pus. In some cases, gauze is packed into the abscess to reduce pain and to continue draining the area. HOME CARE INSTRUCTIONS   Only take over-the-counter or prescription medicines for pain, discomfort, or fever as directed by your caregiver.  If you were prescribed antibiotics, take them as directed. Finish them even if you start to feel better.  If gauze is used, follow your caregiver's directions for changing the gauze.  To avoid spreading the infection:  Keep your draining abscess covered with a  bandage.  Wash your hands well.  Do not share personal care items, towels, or whirlpools with others.  Avoid skin contact with others.  Keep your skin and clothes clean around the abscess.  Keep all follow-up appointments as directed by your caregiver. SEEK MEDICAL CARE IF:   You have increased pain, swelling, redness, fluid drainage, or bleeding.  You have muscle aches, chills, or a general ill feeling.  You have a fever. MAKE SURE YOU:   Understand these instructions.  Will watch your condition.  Will get help right away if you are not doing well or get worse.   This information is not intended to replace advice given to you by your health care provider. Make sure you discuss any questions you have with your health care provider.   Document Released: 09/08/2005 Document Revised: 05/30/2012 Document Reviewed: 02/11/2012 Elsevier Interactive Patient Education 2016 [place].     Antibiotic Medicine Antibiotic medicines are used to treat infections caused by bacteria. They work by injuring or killing the bacteria that is making you sick. HOW IS AN ANTIBIOTIC CHOSEN? An antibiotic is chosen based on many factors. To help your health care provider choose one for you, tell your health care provider if:  You have any allergies.  You are pregnant or plan to get pregnant.  You are breastfeeding.  You are taking any medicines. These include over-the-counter medicines, prescription medicines, and herbal remedies.  You have a medical condition or problem you have not already discussed. Your health care provider will also consider:  How often the  medicine has to be taken.  Common side effects of the medicine.  The cost of the medicine.  The taste of the medicine. If you have questions about why an antibiotic was chosen, make sure to ask. FOR HOW LONG SHOULD I TAKE MY ANTIBIOTIC? Continue to take your antibiotic for as long as told by your health care provider. Do  not stop taking it when you feel better. If you stop taking it too soon:  You may start to feel sick again.  Your infection may become harder to treat.  Complications may develop. WHAT IF I MISS A DOSE? Try not to miss any doses of medicine. If you miss a dose, take it as soon as possible. However, if it is almost time for the next dose:  If you are taking 2 doses per day, take the missed dose and the next dose 5 to 6 hours apart.  If you are taking 3 or more doses per day, take the missed dose and the next dose 2 to 4 hours apart, then go back to the normal schedule. If you cannot make up a missed dose, take the next scheduled dose on time. Then take the missed dose after you have taken all the doses as recommended by your health care provider, as if you had one more dose left. DO ANTIBIOTICS AFFECT BIRTH CONTROL? Birth control pills may not work while you are on antibiotics. If you are taking birth control pills, continue taking them as usual and use a second form of birth control, such as a condom, to avoid unwanted pregnancy. Continue using the second form of birth control until you are finished with your current 1 month cycle of birth control pills. OTHER INFORMATION  If there is any medicine left over, throw it away.  Never take someone else's antibiotics.  Never take leftover antibiotics. SEEK MEDICAL CARE IF:  You get worse.  You do not feel better within a few days of starting the antibiotic medicine.  You vomit.  White patches appear in your mouth.  You have new joint pain that begins after starting the antibiotic.  You have new muscle aches that begin after starting the antibiotic.  You had a fever before starting the antibiotic and it returns.  You have any symptoms of an allergic reaction, such as an itchy rash. If this happens, stop taking the antibiotic. SEEK IMMEDIATE MEDICAL CARE IF:  Your urine turns dark or becomes blood-colored.  Your skin turns  yellow.  You bruise or bleed easily.  You have severe diarrhea and abdominal cramps.  You have a severe headache.  You have signs of a severe allergic reaction, such as:  Trouble breathing.  Wheezing.  Swelling of the lips, tongue, or face.  Fainting.  Blisters on the skin or in the mouth. If you have signs of a severe allergic reaction, stop taking the antibiotic right away.   This information is not intended to replace advice given to you by your health care provider. Make sure you discuss any questions you have with your health care provider.   Document Released: 08/11/2004 Document Revised: 08/20/2015 Document Reviewed: 04/16/2015 Elsevier Interactive Patient Education 2016 [employer]  Anemia, Nonspecific Anemia is a condition in which the concentration of red blood cells or hemoglobin in the blood is below normal. Hemoglobin is a substance in red blood cells that carries oxygen to the tissues of the body. Anemia results in not enough oxygen reaching these tissues.  CAUSES  Common causes  of anemia include:   Excessive bleeding. Bleeding may be internal or external. This includes excessive bleeding from periods (in women) or from the intestine.   Poor nutrition.   Chronic kidney, thyroid, and liver disease.  Bone marrow disorders that decrease red blood cell production.  Cancer and treatments for cancer.  HIV, AIDS, and their treatments.  Spleen problems that increase red blood cell destruction.  Blood disorders.  Excess destruction of red blood cells due to infection, medicines, and autoimmune disorders. SIGNS AND SYMPTOMS   Minor weakness.   Dizziness.   Headache.  Palpitations.   Shortness of breath, especially with exercise.   Paleness.  Cold sensitivity.  Indigestion.  Nausea.  Difficulty sleeping.  Difficulty concentrating. Symptoms may occur suddenly or they may develop slowly.  DIAGNOSIS  Additional blood tests are often  needed. These help your health care provider determine the best treatment. Your health care provider will check your stool for blood and look for other causes of blood loss.  TREATMENT  Treatment varies depending on the cause of the anemia. Treatment can include:   Supplements of iron, vitamin 123456, or folic acid.   Hormone medicines.   A blood transfusion. This may be needed if blood loss is severe.   Hospitalization. This may be needed if there is significant continual blood loss.   Dietary changes.  Spleen removal. HOME CARE INSTRUCTIONS Keep all follow-up appointments. It often takes many weeks to correct anemia, and having your health care provider check on your condition and your response to treatment is very important. SEEK IMMEDIATE MEDICAL CARE IF:   You develop extreme weakness, shortness of breath, or chest pain.   You become dizzy or have trouble concentrating.  You develop heavy vaginal bleeding.   You develop a rash.   You have bloody or black, tarry stools.   You faint.   You vomit up blood.   You vomit repeatedly.   You have abdominal pain.  You have a fever or persistent symptoms for more than 2-3 days.   You have a fever and your symptoms suddenly get worse.   You are dehydrated.  MAKE SURE YOU:  Understand these instructions.  Will watch your condition.  Will get help right away if you are not doing well or get worse.   This information is not intended to replace advice given to you by your health care provider. Make sure you discuss any questions you have with your health care provider.   Document Released: 01/06/2005 Document Revised: 08/01/2013 Document Reviewed: 05/25/2013 Elsevier Interactive Patient Education 2016 [employer]  Percutaneous Abscess Drain, Care After Refer to this sheet in the next few weeks. These instructions provide you with information on caring for yourself after your procedure. Your health care  provider may also give you more specific instructions. Your treatment has been planned according to current medical practices, but problems sometimes occur. Call your health care provider if you have any problems or questions after your procedure. WHAT TO EXPECT AFTER THE PROCEDURE After your procedure, it is typical to have the following:   A small amount of discomfort in the area where the drainage tube was placed.  A small amount of bruising around the area where the drainage tube was placed.  Sleepiness and fatigue for the rest of the day from the medicines used. HOME CARE INSTRUCTIONS  Rest at home for 1-2 days following your procedure or as directed by your health care provider.  If you go home right  after the procedure, plan to have someone with you for 24 hours.  Do not take a bathor shower for 24 hours after your procedure.  Take medicines only as directed by your health care provider. Ask your health care provider when you can resume taking any normal medicines.  Change bandages (dressings) as directed.   You may be told to record the amount of drainage from the bag every time you empty it. Follow your health care provider's directions for emptying the bag. Write down the amount of drainage, the date, and the time you emptied it.  Call your health care provider when the drain is putting out less than 10 mL of drainage per day for 2-3 days in a row or as directed by your health care provider.  Follow your health care provider's instructions for cleaning the drainage tube. You may need to clean the tube every day so that it does not clog. SEEK MEDICAL CARE IF:  You have increased bleeding (more than a small spot) from the site where the drainage tube was placed.  You have redness, swelling, or increasing pain around the site where the drainage tube was placed.  You notice a discharge or bad smell coming from the site where the drainage tube was placed.  You have a fever or  chills.  You have pain that is not helped by medicine.  SEEK IMMEDIATE MEDICAL CARE IF:  There is leakage around the drainage tube.  The drainage tube pulls out.  You suddenly stop having drainage from the tube.  You suddenly have blood in the drainage fluid.  You become dizzy or faint.  You develop a rash.   You have nausea or vomiting.  You have difficulty breathing, feel short of breath, or feel faint.   You develop chest pain.  You have problems with your speech or vision.  You have trouble balancing or moving your arms or legs.   This information is not intended to replace advice given to you by your health care provider. Make sure you discuss any questions you have with your health care provider.   Document Released: 04/15/2014 Document Revised: 09/17/2014 Document Reviewed: 04/15/2014 Elsevier Interactive Patient Education [employer].

## 2016-07-06 NOTE — Clinical Social Work Placement (Signed)
   CLINICAL SOCIAL WORK PLACEMENT  NOTE  Date:  07/06/2016  Patient Details  Name: Brianna [name] MRN: [id number] Date of Birth: 1924-04-24  Clinical Social Work is seeking post-discharge placement for this patient at the [place] level of care (*CSW will initial, date and re-position this form in  chart as items are completed):  Yes   Patient/family provided with [place] Work Department's list of facilities offering this level of care within the geographic area requested by the patient (or if unable, by the patient's family).  Yes   Patient/family informed of their freedom to choose among providers that offer the needed level of care, that participate in Medicare, Medicaid or managed care program needed by the patient, have an available bed and are willing to accept the patient.  Yes   Patient/family informed of 's ownership interest in [facility] and [facility], as well as of the fact that they are under no obligation to receive care at these facilities.  PASRR submitted to [facility] on 06/30/16     PASRR number received on 06/30/16     Existing PASRR number confirmed on       FL2 transmitted to all facilities in geographic area requested by pt/family on       FL2 transmitted to all facilities within larger geographic area on 06/30/16     Patient informed that his/her managed care company has contracts with or will negotiate with certain facilities, including the following:            Patient/family informed of bed offers received.  Patient chooses bed at [facility]     Physician recommends and patient chooses bed at      Patient to be transferred to [facility] on 07/06/16.  Patient to be transferred to facility by [facility]     Patient family notified on 07/06/16 of transfer.  Name of family member notified:  Rodena Piety     PHYSICIAN       Additional Comment:    _______________________________________________ Weston Anna, LCSW 07/06/2016, 2:49 PM

## 2016-07-06 NOTE — Discharge Summary (Signed)
Physician Discharge Summary  Brianna [name] FBX:[id number] DOB: Dec 08, 1924 DOA: 06/19/2016  PCP: Tawanna Solo, MD  Admit date: 06/19/2016 Discharge date: 07/06/2016  Admitted From: Home Disposition:  SNF  Recommendations for Outpatient Follow-up:  1. Follow up with Dr. Benay Spice on Aug 1 as scheduled.   2. Follow up with [facility] in 1 month 3. Follow up with the [facility] as scheduled - They will contact you about appointment. 4. Follow up with ID clinic Dr Johnnye Sima in 2 weeks.   5. Please obtain BMP/CBC in 3 days for recheck.   6. Please consider repeat CT abdomen in 2-4 weeks.   Discharge Condition: Stable CODE STATUS: FULL   Brief/Interim Summary: Brianna [name] is a 82 y.o. female with HTN, cold agglutinin disease presenting with temp of 103, jaundice and dark urine and weight loss of about 10 lbs. She had been feeling bad since 7/5 and was seen in the ER for syncope and discharged home. She returned to there ER after she was noted to be jaundiced. She was found to have elevated LFTS, WBC count of 40 and a CT scan showing right lobe hepatic abscesses. She also had a sodium of 125 and potassium of 5.7. Tylenol level was normal.   She is being followed by GI, oncology, ID and IR, she is on IV antibiotics per ID and has a right upper quadrant drain placed by IR. She also has issues with hemolytic anemia, she's been requiring transfusions, oncology is assisting in management of this problem.  Bone Marrow Biopsy was done by IR on 7/21.    Due to her liver abscess and poor appetite she has severe protein calorie malnutrition with third spacing of fluids which is being removed with gentle diuresis.  Pt to return to have new larger drain placed by IR 7/19.  Repeat CT 7/23 shows improvement in abscesses.   Hepatic abscess causing sepsis Sepsis,  Acute liver failure with Elevated LFTs & jaundice - ID, GI and IR following. Sepsis resolved now.  She was treated with IV unasyn under  the guidance of ID, right upper quadrant drain placed by IR on 06/21/2016. LFTs and total bilirubin have improved,  Pt to go home on augmentin oral tablets for 2 weeks.  Pt s/p new larger drain 7/19 which is not draining much anymore.  WBC is trending down.  Repeated CT abd/pelv 7/23 shows improvement.  Pt to follow-up with drain clinic for care of drain.    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 Oncology and following and assisting. There is question of underlying proliferative disorder. Bone marrow pathology pending.  Hemoglobin down to 6.6.  Transfused 2 units 7/21.  Hg stable at 9 post recent transfusion.  Recheck CBC in 3 days.  Follow up with Dr. Benay Spice on Aug 1.  Cont Folic acid.   Leukocytosis - trending down. mostly neutrophils- suspecting possibly underlying proliferative disorder in addition to current infection, Heme/Onc ordered bone marrow biopsy done on 7/21.  Final Pathology pending.  Preliminary pathology does not seem so show a lymphoproliferative disorder.    Difficulty with urination - stable post-void residuals.  Pt reports that she is having increasing difficulty with urination.   I spoke with Dr Alger Simons on phone and I ran the case by him and he suggested starting flomax.  Flomax  started 7/22 and patient reporting improvement in symptoms.   Chronic Right mid back pain - Voltaren gel as needed seems to help.  Tylenol prn.  Hyponatremia/ pedal edema - sodium stable. Following.  Elevate extremities.  Stopping sodium tablets now.   Hypoalbuminemia/ Moderate protein calorie malnutrition/ anasarca - due to abscesses and poor appetite, continue protein supplementation, appetite improving, continue gentle diuresis as permitted by blood pressure and renal function.  Hypoxia on exertion -  pulm edema due to third  spacing and fluid overload +  atelectasis due to being bed bound she was started on PO Lasix + IS, continue to gently diurese and monitor. Shortness of breath has considerably improved.   Generalized weakness and debility - pt has become deconditioned and will require SNF rehab.  PT while in hospital.  OOB and to SNF for rehab.     Abdominal pain -   Improved.      Benign essential HTN - Metoprolol seems to keep controlled.   Discharge Diagnoses:  Principal Problem:   Hepatic abscess Active Problems:   Spinal stenosis of lumbar region   Benign essential HTN   Sepsis (HCC)   Hyponatremia   Hyperkalemia   Jaundice   Elevated LFTs   Malnutrition of moderate degree   Hypoxia   Liver abscess   Hemolytic anemia (HCC)   Cold agglutinin disease (HCC)   IgM monoclonal gammopathy of uncertain significance   Leukocytosis   Anemia  Discharge Instructions  Discharge Instructions    Discharge instructions    Complete by:  As directed   Please check CBC in 3 days and send results to hematologist Dr. Benay Spice.   Elevate head of bed and use applesauce when taking pills.  Please make sure patient gets to her follow up appointments.   Increase activity slowly    Complete by:  As directed       Medication List    STOP taking these medications   ibuprofen 200 MG tablet Commonly known as:  ADVIL,MOTRIN   ibuprofen 800 MG tablet Commonly known as:  ADVIL,MOTRIN   Melatonin 5 MG Tabs     TAKE these medications   acetaminophen 500 MG tablet Commonly known as:  TYLENOL Take 1 tablet (500 mg total) by mouth every 6 (six) hours as needed for mild pain, moderate pain, fever or headache. What changed:  how much to take   ALPRAZolam 0.5 MG tablet Commonly known as:  XANAX Take 1 tablet (0.5 mg total) by mouth at bedtime as needed for anxiety.   amoxicillin-clavulanate 875-125 MG tablet Commonly known as:  AUGMENTIN Take 1 tablet by mouth 2 (two) times daily.   conjugated estrogens  vaginal cream Commonly known as:  PREMARIN Place 1 Applicatorful vaginally 3 (three) times a week.   diclofenac sodium 1 % Gel Commonly known as:  VOLTAREN Apply 2 g topically 4 (four) times daily.   diphenhydrAMINE 25 MG tablet Commonly known as:  BENADRYL Take 25 mg by mouth at bedtime as needed for sleep.   famotidine 10 MG tablet Commonly known as:  PEPCID Take 10 mg by mouth daily as needed for heartburn or indigestion.   folic acid 1 MG tablet Commonly known as:  FOLVITE Take 1 tablet (1 mg total) by mouth daily.   metoprolol succinate 25 MG 24 hr tablet Commonly known as:  TOPROL-XL Take 1 tablet (25 mg total) by mouth daily.   multivitamin with minerals Tabs tablet Take 1 tablet by mouth daily.   nutrition  supplement (JUVEN) Pack Take 2 packets by mouth 2 (two) times daily between meals.   ondansetron 4 MG tablet Commonly known as:  ZOFRAN Take 1 tablet (4 mg total) by mouth every 8 (eight) hours as needed for nausea or vomiting. What changed:  medication strength  how much to take   SYSTANE OP Apply 2 drops to eye 3 (three) times daily as needed (dry eyes).   tamsulosin 0.4 MG Caps capsule Commonly known as:  FLOMAX Take 1 capsule (0.4 mg total) by mouth daily after breakfast.      Follow-up Information    Betsy Coder, MD. Go on 07/13/2016.   Specialty:  Oncology Contact information: [place] [address] [phone]        Bobby Rumpf, MD. Schedule an appointment as soon as possible for a visit in 2 week(s).   Specialty:  Infectious Diseases Contact information: [address] [place] [address] [facility] .   Why:  They will call you with a follow up appointment.         [facility] Schedule an appointment as soon as possible for a visit in 1 month(s).   Contact information: [address]  [place] [address] [place]  [address] [phone]           Allergies  Allergen Reactions  . Other Nausea And Vomiting    *All pain medications*  . Compazine [Prochlorperazine] Other (See Comments)    Restless, hallucinations  . Erythromycin     "passed out"  . Lyrica [Pregabalin] Other (See Comments)    Sleep walking  . Promethazine Other (See Comments)    Restless, hallucinations  . Tramadol Nausea And Vomiting  . Vistaril [Hydroxyzine Hcl] Other (See Comments)    Stated she had hallucinations.    Procedures/Studies: Dg Chest 2 View  Result Date: 06/19/2016 CLINICAL DATA:  Fever EXAM: CHEST  2 VIEW COMPARISON:  06/16/2016 chest radiograph. FINDINGS: Stable cardiomediastinal silhouette with mild cardiomegaly. No pneumothorax. No pleural effusion. No overt pulmonary edema. New mild patchy opacity at the right lung base. Interval stability of moderate to severe lower thoracic vertebral compression fracture. IMPRESSION: 1. New mild patchy right lung base opacity, cannot exclude aspiration or pneumonia. Recommend follow-up PA and lateral post treatment chest radiographs in 4-6 weeks. 2. Stable mild cardiomegaly without overt pulmonary edema. Electronically Signed   By: Ilona Sorrel M.D.   On: 06/19/2016 13:57   Dg Chest 2 View  Result Date: 06/16/2016 CLINICAL DATA:  Lightheadedness EXAM: CHEST  2 VIEW COMPARISON:  12/10/2013 FINDINGS: Mild cardiac enlargement. Aortic atherosclerosis. There is no pleural effusion or edema. There are coarsened interstitial markings identified bilaterally. No airspace consolidation. There is a compression fracture within the lower thoracic spine, new from previous exam. IMPRESSION: 1. Mild cardiac enlargement and aortic atherosclerosis. 2. Age-indeterminate lower thoracic spine compression fracture. New from 12/10/2013. Electronically Signed   By: Kerby Moors M.D.   On: 06/16/2016 11:14   Dg Pelvis 1-2 Views  Result Date: 06/16/2016 CLINICAL DATA:  Evaluate for pelvis fracture after fall. Initial encounter. EXAM:  PELVIS - 1-2 VIEW COMPARISON:  None. FINDINGS: No evidence of pelvic ring fracture or diastasis. Previous sacral insufficiency fractures without current visualization. Foreshortened appearance of the right femoral neck attributed to rotation. No definitive hip fracture. No dislocation. Osteopenia. IMPRESSION: No acute finding.  If hip pain recommend dedicated hip series. Electronically Signed   By:  Monte Fantasia M.D.   On: 06/16/2016 11:06   Ct Head Wo Contrast  Result Date: 06/16/2016 CLINICAL DATA:  Fall. EXAM: CT HEAD WITHOUT CONTRAST TECHNIQUE: Contiguous axial images were obtained from the base of the skull through the vertex without intravenous contrast. COMPARISON:  None. FINDINGS: Brain: There is prominence of the sulci and ventricles. Mild low attenuation within the periventricular and subcortical white matter identified compatible with chronic microvascular disease. No evidence of acute infarction, hemorrhage, extra-axial collection, ventriculomegaly, or mass effect. Vascular: No hyperdense vessel or unexpected calcification. Skull: Negative for fracture or focal lesion. Sinuses/Orbits: No acute findings. Other: None. IMPRESSION: 1. Mild brain atrophy and chronic microvascular disease. 2. No acute findings. Electronically Signed   By: Kerby Moors M.D.   On: 06/16/2016 11:20   US Abdomen Complete  Result Date: 06/19/2016 CLINICAL DATA:  Hyperbilirubinemia, history hypertension EXAM: ABDOMEN ULTRASOUND COMPLETE COMPARISON:  None FINDINGS: Gallbladder: Normally distended. Few tiny dependent calculi at gallbladder neck. No gallbladder wall thickening pericholecystic fluid. Non shadowing non mobile echogenic focus at mid gallbladder most consistent with polyp 6 mm diameter. No sonographic Murphy sign. Common bile duct: Diameter: Question leaned visualized, question 2 mm diameter Liver: Normal echogenicity. Mass lesion centrally RIGHT lobe 3.6 x 3.6 x 3.6 cm, complex hypoechoic with few areas partial  septation. No internal blood flow on color Doppler imaging within this lesion. A second lesion posteriorly RIGHT lobe liver, 5.1 x 4.1 x 5.0 cm, similar characteristics. These require further assessment by CT or MR. No additional focal hepatic abnormalities. Hepatopetal portal venous flow. IVC: Normal appearance Pancreas: Normal parenchymal echogenicity. Small cystic lesion at pancreatic tail 10 x 8 x 9 mm with an additional questionable tiny cystic lesion at pancreatic tail 9 x 5 x 8 mm. Spleen: Normal appearance, 5.8 cm length Right Kidney: Length: 9.1 cm. Mild collecting system dilatation/hydronephrosis. No mass or calcification. Left Kidney: Length: 10.9 cm. Suboptimal visualization to body habitus. No gross evidence of mass or hydronephrosis. Abdominal aorta: Normal caliber with scattered atherosclerotic change. Other findings: No free fluid IMPRESSION: 2 hepatic masses are identified measuring 3.6cm nad 5.1 cm in greatest sizes, both complex predominately hypoechoic without internal blood flow, uncertain etiology; further characterization of these lesions by MR or CT imaging with and without contrast recommended. Tiny nonspecific cystic lesions at pancreatic tail. Cholelithiasis with additional 6 mm gallbladder polyp, likely benign based on size, no further workup required; This recommendation follows ACR consensus guidelines: White Paper of the ACR Incidental Findings Committee II on Gallbladder and Biliary Findings. J Am Coll Radiol 2013:;10:953-956. Electronically Signed   By: Lavonia Dana M.D.   On: 06/19/2016 15:15   Ct Abdomen Pelvis W Contrast  Result Date: 07/04/2016 CLINICAL DATA:  Follow-up known liver abscesses. EXAM: CT ABDOMEN AND PELVIS WITH CONTRAST TECHNIQUE: Multidetector CT imaging of the abdomen and pelvis was performed using the standard protocol following bolus administration of intravenous contrast. CONTRAST:  166m ISOVUE-300 IOPAMIDOL (ISOVUE-300) INJECTION 61% COMPARISON:  June 19, 2016 and June 25, 2016 FINDINGS: Bilateral pleural effusions and atelectasis are stable since June 25, 2016. The lung bases are otherwise unchanged. No free air. Free fluid in the pelvis is increased in the interval. There is rim enhancement around a pocket of fluid in the right pelvis on series 3, image 61, measuring 3 by 1.6 cm. No abnormal fluid otherwise seen in the abdomen. The percutaneous drainage catheter remains in the liver. The pigtail is now within the more posterior abscess. This abscess is  smaller in the interval measuring 3.3 x 3.5 cm today versus 4.8 by 4.2 cm previously. The more peripheral abscess is also being appropriately drained and is also smaller in the interval. There is a track of low attenuation extending from the more peripheral abscess into the posterior right hepatic lobe which was not seen previously. There is also a small region of low attenuation in the hepatic dome on axial image 11, not clearly seen previously. The pigtail catheter originally ended in this region of the medial hepatic dome on the June 25, 2016 study. Periportal edema remains. The portal vein is patent. A subcapsular hepatic collection on series 3, image 25 remains measuring 3.8 by 1.1 cm today versus 6.7 by 1.1 cm previously. The gallbladder is distended to a similar degree compared to June 25, 2016. The spleen and adrenal glands are normal. An indeterminate low-attenuation lesion measuring 17 mm in the left kidney on image 28 is stable. Several renal cysts are noted. Three low-attenuation rounded regions of the pancreas are stable. Atherosclerosis is seen in the abdominal aorta. There is a small hiatal hernia. The stomach is fluid filled and mildly distended but there is no convincing evidence of outlet obstruction. The small bowel is normal. The colon is normal. There is no secondary evidence of appendicitis. Free fluid in the pelvis. Rim enhancing fluid to collection identified in the right posterior pelvis. No  adenopathy or mass. No other pelvic changes. Significant subcutaneous edema remains but has improved in the interval. Delayed images demonstrate no filling defects in the upper renal collecting systems. Significant off loss of height of T11, unchanged. Mild loss of height of L1 is also unchanged. IMPRESSION: 1. The previously seen abscesses in the liver are smaller in the interval. There is a small region of low attenuation near the medial hepatic dome which was the site of the pigtail catheter on the previous study. This could be postprocedural. It would be difficult to exclude a developing abscess in this region. Recommend attention on follow-up. There is also a track of low attenuation extending from the more anterior superficial abscess into the posterior hepatic dome. Extension of infection is not excluded but the track like appearance raises the possibility of postprocedural change. Recommend attention on followup. 2. Rim enhancing collection of fluid in the right posterior pelvis concerning for developing abscess. 3. The subcapsular fluid collection over the right hepatic lobe is smaller in the interval. 4. Stable indeterminate lesion in the left kidney. 5. Stable low-attenuation lesions in the pancreas. 6. Stable compression fractures in the spine. Electronically Signed   By: Dorise Bullion III M.D   On: 07/04/2016 12:13  Ct Abdomen Pelvis W Contrast  Result Date: 06/25/2016 CLINICAL DATA:  82 year old female with hypertension. Jaundice. Loss of weight. Fevers. EXAM: CT ABDOMEN AND PELVIS WITH CONTRAST TECHNIQUE: Multidetector CT imaging of the abdomen and pelvis was performed using the standard protocol following bolus administration of intravenous contrast. CONTRAST:  19m ISOVUE-300 IOPAMIDOL (ISOVUE-300) INJECTION 61% COMPARISON:  06/19/2016 FINDINGS: Lower chest: There are bilateral pleural effusions, right greater than left. These appear increased in volume when compared with previous exam.  Hepatobiliary: Liver abscess ease are again identified. A percutaneous drainage catheter is in place. The larger of the 2 abscesses measures 4.8 cm, image number 48 of series 2. Previously 4.7 cm. The more lateral abscess measures 3.7 cm, image 34 of series 2. Previously 4.2 cm. No new fluid collections identified. Pancreas: There are a few scattered low-density lesions within the pancreas  which are not changed from previous exam. The largest is in the distal tail measuring 7 mm, image 43 of series 2. Spleen: The spleen appears normal. Adrenals/Urinary Tract: Indeterminate, intermediate attenuating lesion in the mid left kidney measures 1.7 cm and 39 HU, image 45 of series 2. Unchanged from previous study. The bladder is normal. Stomach/Bowel: The stomach is normal. There is no pathologic dilatation of the small bowel loops. Moderate stool burden is identified throughout the colon. No pathologic dilatation of the large bowel loops identified. Vascular/Lymphatic: Calcified atherosclerotic disease involves the abdominal aorta. No aneurysm. No enlarged retroperitoneal or mesenteric adenopathy. No enlarged pelvic or inguinal lymph nodes. Reproductive: The uterus and adnexal structures are unremarkable. Other: There is a small amount of ascites noted within the dependent portion of the pelvis. The diffuse body wall edema is identified. A small amount of perihepatic ascites noted. Musculoskeletal: No aggressive lytic or sclerotic bone lesions. The bones appear osteopenic. There is a stable compression fractures involve T11 and L1. Degenerative disc disease is noted at L4-5. IMPRESSION: 1. Two liver abscesses are again identified within the right lobe. Overall there has been no significant change in size of these complex fluid collections. The percutaneous drainage catheter remains in appropriate position. 2. Similar appearance of indeterminate low-attenuation lesions within the spleen an intermediate attenuating foci  within the left kidney. Although favored to represent benign foci these will require nonemergent follow-up imaging with contrast enhanced MRI of the upper abdomen per for bili performed following resolution of the patient's current clinical condition on an outpatient basis. 3. Aortic atherosclerosis 4. Increase in pleural effusions and body wall edema. Electronically Signed   By: Kerby Moors M.D.   On: 06/25/2016 18:25   Ct Abdomen Pelvis W Contrast  Result Date: 06/19/2016 CLINICAL DATA:  Worsening upper abdominal pain and fever. EXAM: CT ABDOMEN AND PELVIS WITH CONTRAST TECHNIQUE: Multidetector CT imaging of the abdomen and pelvis was performed using the standard protocol following bolus administration of intravenous contrast. CONTRAST:  33m ISOVUE-300 IOPAMIDOL (ISOVUE-300) INJECTION 61% COMPARISON:  CT scan of the chest December 10, 2013 FINDINGS: Small bilateral pleural effusions are identified. There is mild atelectasis in the left base. Streaky opacity posteriorly on the right is likely atelectasis as well. No infiltrate suspicious for pneumonia. No nodule or mass. The lung bases are otherwise unremarkable. No free air identified. There is a small amount of free fluid in the pelvis which is nonspecific. There are 2 masses in the right hepatic lobe with the first seen on series 3, image 25 measuring 4.7 by 4.1 cm and the other seen on image 28, a little smaller. There is no definitive peripheral nodular enhancement associated with either of these masses, including on delayed images. The masses demonstrate attenuations between 10 and 20 Hounsfield units. These masses are new when compared to the December 10, 2013 study. A few tiny foci of low attenuation in the liver likely represent tiny cysts. Periportal edema is identified. The portal vein is patent. A rounded region of low attenuation is seen in the pancreatic tail on series 3, image 33 measuring 7.5 mm and too small to characterize. Another smaller  similar region is seen on image 33 in the body of the pancreas. No other pancreatic masses. The spleen and adrenal glands are normal. 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 There is a probable cyst in the right kidney on image 51, too small to characterize. No solid masses are seen in the right kidney. Low-attenuation  in the left kidney on series 3, image 32 is too small to characterize as well. A larger rounded region of low attenuation in the left kidney on image 34 measures up to 17 mm is not definitively cystic on this study with an attenuation of 28 Hounsfield units. No other suspicious abnormality seen in either kidney. There is atherosclerotic change in the tortuous abdominal aorta with no aneurysm or dissection. No adenopathy. The cholelithiasis and probable polyp in the gallbladder seen on recent ultrasound are not seen on the CT scan. The gallbladder is unremarkable. There is a small hiatal hernia. The stomach is decompressed limiting evaluation but no gastric abnormalities are seen. The stomach and small bowel are unremarkable. There is fecal loading throughout the colon. A region of relative narrowing in the sigmoid colon is thought to be due to peristalsis with no identified colonic mass. The patient is status post appendectomy. No adenopathy is seen in the pelvis. The uterus and adnexae are unremarkable. The bladder is normal. Degenerative changes are seen in the spine. There is anterior wedging of T11 which is new compared to 2014 with approximately 70% loss of height. There is mild anterior wedging of L1 which is age indeterminate but may be chronic. No other bony abnormalities. IMPRESSION: 1. There are 2 low-attenuation masses in the right hepatic lobe new since December 2014. These do not meet the criteria for hemangiomas. The low-attenuation would suggest cystic components. Recommended MRI for further characterisation. Cystic neoplasms or abscesses are not excluded on this study.  2. Indeterminate rounded low-attenuation measuring 17 mm in the left kidney. This could represent a hyperdense cyst or a solid neoplasm based on this CT scan. Recommend attention to this region on the MRI to exclude an underlying solid neoplasm. Other smaller regions of low attenuation in the kidneys are probably cystic. 3. 2 small regions of low attenuation in the pancreatic body and tail are nonspecific. Recommend attention to these regions on the recommended MRI. These are of low suspicion. 4. Age indeterminate compression fractures of T11 and L1. Recommend clinical correlation. 5. There is a small amount of fluid in the pelvis which is nonspecific. 6. Mild periportal edema, nonspecific. 7. Small bilateral effusions with associated atelectasis. 8. Moderate to severe fecal loading throughout the colon. Electronically Signed   By: Dorise Bullion III M.D   On: 06/19/2016 21:37   Ct Abscess Cath Exchange  Result Date: 07/01/2016 INDICATION: Status post percutaneous drainage of 2 separate hepatic abscesses on 06/21/2016 with placement of an elongated drainage catheter across both collections. There has been worsening elevation of white blood cell count with CT on 06/25/2016 demonstrating slight reduction in a lateral right hepatic abscess and minimal change in a medial right lobe hepatic abscess. EXAM: CT ABSCESS CATH EXCHANGE MEDICATIONS: The patient is currently admitted to the hospital and receiving intravenous antibiotics. The antibiotics were administered within an appropriate time frame prior to the initiation of the procedure. ANESTHESIA/SEDATION: Fentanyl 50 mcg IV; Versed 3.0 mg IV Moderate Sedation Time:  62 minutes. The patient was continuously monitored during the procedure by the interventional radiology nurse under my direct supervision. COMPLICATIONS: None immediate. PROCEDURE: Informed written consent was obtained from the patient after a thorough discussion of the procedural risks, benefits and  alternatives. All questions were addressed. Maximal Sterile Barrier Technique was utilized including caps, mask, sterile gowns, sterile gloves, sterile drape, hand hygiene and skin antiseptic. A timeout was performed prior to the initiation of the procedure. The pre-existing exiting hepatic drainage  catheter and surrounding skin were prepped with chlorhexidine. During the procedure, 1% lidocaine was infiltrated to provide local anesthesia around the tube exit site. The current drainage catheter course was imaged by CT. The catheter was cut and removed over a guidewire. A 5 French diagnostic catheter was then advanced into the liver parenchyma. The catheter and guidewire were manipulated to gain wire access into the deeper, more medial hepatic abscess. A new 12 French drainage catheter was then advanced over the wire and into the collection. The catheter positioning was confirmed by CT. The catheter was flushed with saline and connected to a suction bulb. It was secured at the skin with a Prolene retention suture and StatLock device. The rest of the procedure was spent trying to gain separate access into the lateral hepatic abscess. 18 gauge trocar needles were advanced into this collection and guidewires advanced as well as attempts at aspiration. Ultimately, attempt to place a second new hepatic abscess drainage catheter were aborted. FINDINGS: Based on unenhanced CT images obtained, the lateral right lobe hepatic abscess clearly is significantly smaller than on the imaging obtained at the time of initial drainage. Maximal diameter of this area of residual collections/edema has decreased from approximately 4.3 cm down to 2.6 cm. The medial abscess is also smaller than original size with maximal diameter of approximately 4.1 cm compared to 4.7 cm prior to drainage. The pre-existing multi side-hole drainage catheter extends through both collections and terminates in the medial dome of the liver. With catheter  exchange, there with success in placing a standard 12 French pigtail drainage catheter into the larger medial collection with return of some debris and bloody fluid. With multiple separate needle accesses of the more lateral collection, there was inability to aspirate free fluid. Guidewire advancement extended through the parenchyma and did not coil within the collection to allow placement of a second drain. Based on decreased in size as well as inability to accurately place a second drain in this collection, attempts at further new drainage catheter placement were aborted. This area appears to be resolving and will be followed while the patient receives appropriate antibiotic therapy. IMPRESSION: 1. Successful exchange of the elongated multi side-hole drain placed initially with a standard 12 French pigtail drain formed in the larger residual medial right lobe hepatic abscess. This drain will be connected to suction bulb drainage and flushed 3 times daily. 2. The lateral hepatic abscess shows more significant size reduction after initial drainage. Attempts at placing a second drain in this collection were unsuccessful due to lack of liquefaction and inability to coiled a wire within the collection. This may be essentially resolved from a drainage perspective with some residual edema/fluid remaining. This collection will be followed as well as the remaining abscess containing a drain while the patient is receiving appropriate antibiotic therapy. CT resolution of hepatic collections with drainage can take weeks to months and once the patient is able to be discharged from the hospital, abscess drainage catheter management and status of collections can be followed in the IR drain Clinic. Electronically Signed   By: Aletta Edouard M.D.   On: 07/01/2016 09:01   Ct Biopsy  Result Date: 07/02/2016 INDICATION: 82 year old female with anemia and leukocytosis. EXAM: CT GUIDED BONE MARROW ASPIRATION AND CORE BIOPSY  Interventional Radiologist:  Criselda Peaches, MD MEDICATIONS: None. ANESTHESIA/SEDATION: Moderate (conscious) sedation was employed during this procedure. A total of 1.5 milligrams versed and 100 micrograms fentanyl were administered intravenously. The patient's level of consciousness and vital  signs were monitored continuously by radiology nursing throughout the procedure under my direct supervision. Total monitored sedation time: 8 minutes FLUOROSCOPY TIME:  None COMPLICATIONS: None immediate. Estimated blood loss: <25 mL PROCEDURE: Informed written consent was obtained from the patient after a thorough discussion of the procedural risks, benefits and alternatives. All questions were addressed. Maximal Sterile Barrier Technique was utilized including caps, mask, sterile gowns, sterile gloves, sterile drape, hand hygiene and skin antiseptic. A timeout was performed prior to the initiation of the procedure. The patient was positioned prone and non-contrast localization CT was performed of the pelvis to demonstrate the iliac marrow spaces. Maximal barrier sterile technique utilized including caps, mask, sterile gowns, sterile gloves, large sterile drape, hand hygiene, and betadine prep. Under sterile conditions and local anesthesia, an 11 gauge coaxial bone biopsy needle was advanced into the left iliac marrow space. Needle position was confirmed with CT imaging. Initially, bone marrow aspiration was performed. Next, the 11 gauge outer cannula was utilized to obtain a left iliac bone marrow core biopsy. Needle was removed. Hemostasis was obtained with compression. The patient tolerated the procedure well. Samples were prepared with the cytotechnologist. IMPRESSION: Successful CT-guided bone marrow biopsy. Electronically Signed   By: [name] M.D.   On: 07/02/2016 11:54   Dg Chest Port 1 View  Result Date: 06/24/2016 CLINICAL DATA:  Shortness of breath today. EXAM: PORTABLE CHEST 1 VIEW COMPARISON:   06/19/2016. FINDINGS: 0950 hours. Patient is rotated to the right. Low volume film with bibasilar atelectasis or infiltrate and small bilateral pleural effusions. The cardio pericardial silhouette is enlarged. Bones are diffusely demineralized. Telemetry leads overlie the chest. Pigtail catheter over the right upper quadrant is compatible with recent hepatic abscess drainage. IMPRESSION: Rotated film with vascular congestion and bibasilar atelectasis or infiltrate. Small bilateral pleural effusions. Electronically Signed   By: Misty Stanley M.D.   On: 06/24/2016 10:10   Ct Image Guided Drainage Percut Cath  Peritoneal Retroperit  Result Date: 06/21/2016 CLINICAL DATA:  past medical history of hypertension, T 11 fracture who was admitted to the hospital on 7/8 with malaise, fever, intermittent abdominal/substernal discomfort, diminished appetite/oral intake, weakness, jaundice and occasional nausea. Subsequent laboratory evaluation revealed marked leukocytosis, as well as anemia and elevated LFTs. She has received blood transfusion. Current laboratory values reveal WBC of 36.8, hemoglobin 10.2, platelets 339 k, creatinine 0.49, total bilirubin 9.8 , PT 15.5, INR 1.22 . CT of the abdomen and pelvis on 7/8 revealed 2 low attenuation areas in the right hepatic lobe which are new since December 2014 and suspicious for hepatic abscesses and less likely neoplasm. Request now received for CT guided drainage of the above-mentioned abscesses. " EXAM: CT AND ULTRASOUND GUIDED DRAINAGE OF LIVER ABSCESSES ANESTHESIA/SEDATION: Intravenous Fentanyl and Versed were administered as conscious sedation during continuous monitoring of the patient's level of consciousness and physiological / cardiorespiratory status by the radiology RN, with a total moderate sedation time of 25 minutes. PROCEDURE: The procedure, risks, benefits, and alternatives were explained to the patient. Questions regarding the procedure were encouraged and  answered. The patient understands and consents to the procedure. The lesions were better localized with ultrasound so survey ultrasound of the level was used to determine appropriate skin entry site. The operative field was prepped with chlorhexidinein a sterile fashion, and a sterile drape was applied covering the operative field. A sterile gown and sterile gloves were used for the procedure. Local anesthesia was provided with 1% Lidocaine. Under real-time ultrasound guidance, a 25 gauge  trocar needle was advanced through the segment 5 lesion into the segment 7 lesion. Purulent material could be aspirated. An Amplatz guidewire advanced easily. Limited CT was performed to confirm appropriate needle a guidewire positioning. The tract was then dilated to allow placement of a 12 French multi side-hole biliary drain catheter, positioned with the distal sideholes and the segment 7 collection, proximal side holes in the segment 5 collection. A total of 75 mL purulent material were aspirated. A sample was sent for Gram stain, culture and sensitivity. Catheter was secured externally with 0 Prolene suture and StatLock, and placed to gravity drain after flushing. The patient tolerated the procedure well. COMPLICATIONS: None immediate FINDINGS: Two complex right lobe fluid collections were identified on ultrasound corresponding to the lesions seen on prior CT. Multi side hole drain catheter was placed across both lesions as detailed above. IMPRESSION: 1. Technically successful CT-guided drain catheter placement across 2 right lobe hepatic abscesses. Electronically Signed   By: Lucrezia Europe M.D.   On: 06/21/2016 17:30   Subjective: Pt is motivated to go to rehab.    Discharge Exam: Vitals:   07/05/16 2155 07/06/16 0539  BP: (!) 142/73 (!) 145/68  Pulse: 95 94  Resp: 18 18  Temp: 98.6 F (37 C) 98.3 F (36.8 C)   Vitals:   07/05/16 1000 07/05/16 1341 07/05/16 2155 07/06/16 0539  BP: 128/62 (!) 144/66 (!) 142/73  (!) 145/68  Pulse: 86 81 95 94  Resp:  _0 Temp:  98.5 F (36.9 C) 98.6 F (37 C) 98.3 F (36.8 C)  TempSrc:  Oral Oral Oral  SpO2:  99% 95% 97%  Weight:    61.6 kg (135 lb 11.2 oz)  Height:        General:  Pt is alert, not in acute distress, jaundice resolved.   HEENT: resolved scleral icterus, No thrush, oral mucosa moist  Cardiovascular: regular rate and rhythm, S1/S2 No murmur  Respiratory: clear to auscultation bilaterally   Abdomen: Soft, +Bowel sounds, less distended, tenderness RUQ/RLQ with light palpation, - drain in place  MSK: No cyanosis or clubbing- 1+ pitting pedal edema   The results of significant diagnostics from this hospitalization (including imaging, microbiology, ancillary and laboratory) are listed below for reference.    Microbiology: No results found for this or any previous visit (from the past 240 hour(s)).   Labs: BNP (last 3 results) No results for input(s): BNP in the last 8760 hours. Basic Metabolic Panel:  Recent Labs Lab 06/30/16 0342 07/01/16 0401 07/02/16 0350 07/03/16 0518 07/05/16 0346  NA 130* 131* 133* 132* 133*  K 3.8 4.5 3.5 4.2 3.5  CL 95* 96* 97* 100* 101  CO2 _1 GLUCOSE 106* 95 103* 98 97  BUN _2 CREATININE 0.58 0.47 0.44 0.43* 0.40*  CALCIUM 7.7* 7.8* 7.8* 8.0* 7.9*  MG 2.2  --   --   --   --    Liver Function Tests:  Recent Labs Lab 06/30/16 0342 07/01/16 0401 07/02/16 0350 07/03/16 0518 07/05/16 0346  AST 28 50* 29 34 18  ALT 41 51 42 34 25  ALKPHOS 176* 176* 173* 169* 144*  BILITOT 4.6* 4.3* 3.6* 3.1* 2.8*  PROT 5.4* 5.5* 5.5* 5.8* 5.7*  ALBUMIN 2.4* 2.4* 2.4* 2.6* 2.6*   No results for input(s): LIPASE, AMYLASE in the last 168 hours. No results for input(s): AMMONIA in the last 168 hours. CBC:  Recent Labs Lab  06/30/16 0342 07/02/16 0350 07/04/16 0638 07/05/16 0346  WBC 31.4* 23.1* 18.0* 16.8*  NEUTROABS  --   --   --  12.0*  HGB 7.6* 6.6* 9.1* 9.0*  HCT  21.4* 18.8* 26.8* 26.1*  MCV 93.4 94.5 93.4 93.2  PLT 696* 746* PLATELET CLUMPS NOTED ON SMEAR, COUNT APPEARS INCREASED 608*   Cardiac Enzymes: No results for input(s): CKTOTAL, CKMB, CKMBINDEX, TROPONINI in the last 168 hours. BNP: Invalid input(s): POCBNP CBG: No results for input(s): GLUCAP in the last 168 hours. D-Dimer No results for input(s): DDIMER in the last 72 hours. Hgb A1c No results for input(s): HGBA1C in the last 72 hours. Lipid Profile No results for input(s): CHOL, HDL, LDLCALC, TRIG, CHOLHDL, LDLDIRECT in the last 72 hours. Thyroid function studies No results for input(s): TSH, T4TOTAL, T3FREE, THYROIDAB in the last 72 hours.  Invalid input(s): FREET3 Anemia work up No results for input(s): VITAMINB12, FOLATE, FERRITIN, TIBC, IRON, RETICCTPCT in the last 72 hours. Urinalysis    Component Value Date/Time   COLORURINE AMBER (A) 06/25/2016 1731   APPEARANCEUR CLEAR 06/25/2016 1731   LABSPEC 1.012 06/25/2016 1731   PHURINE 5.0 06/25/2016 1731   GLUCOSEU NEGATIVE 06/25/2016 1731   HGBUR NEGATIVE 06/25/2016 1731   BILIRUBINUR NEGATIVE 06/25/2016 1731   KETONESUR NEGATIVE 06/25/2016 1731   PROTEINUR NEGATIVE 06/25/2016 1731   UROBILINOGEN 1.0 12/10/2013 0452   NITRITE NEGATIVE 06/25/2016 1731   LEUKOCYTESUR TRACE (A) 06/25/2016 1731   Sepsis Labs Invalid input(s): PROCALCITONIN,  WBC,  LACTICIDVEN Microbiology No results found for this or any previous visit (from the past 240 hour(s)).  Time spent 45 mins preparing discharge.    SIGNED:  Irwin Brakeman, MD  [facility] 07/06/2016, 10:10 AM Pager   If 7PM-7AM, please contact night-coverage [email or web address] Password [email or web address]

## 2016-07-07 ENCOUNTER — Telehealth: Payer: Self-pay | Admitting: *Deleted

## 2016-07-07 ENCOUNTER — Other Ambulatory Visit: Payer: Self-pay | Admitting: *Deleted

## 2016-07-07 NOTE — Telephone Encounter (Signed)
Call from [place] at [facility] asking if pt is scheduled for follow up.  Order sent to schedulers for Lab/MD on 7/31. Requested they fax appts to [place] at [facility] @ [phone].

## 2016-07-12 ENCOUNTER — Ambulatory Visit (HOSPITAL_BASED_OUTPATIENT_CLINIC_OR_DEPARTMENT_OTHER): Payer: Medicare Other | Admitting: Oncology

## 2016-07-12 ENCOUNTER — Telehealth: Payer: Self-pay | Admitting: Oncology

## 2016-07-12 ENCOUNTER — Other Ambulatory Visit (HOSPITAL_BASED_OUTPATIENT_CLINIC_OR_DEPARTMENT_OTHER): Payer: Medicare Other

## 2016-07-12 VITALS — BP 135/72 | HR 106 | Temp 98.6°F | Resp 18 | Ht 63.5 in | Wt 119.2 lb

## 2016-07-12 DIAGNOSIS — D591 Other autoimmune hemolytic anemias: Secondary | ICD-10-CM

## 2016-07-12 DIAGNOSIS — K750 Abscess of liver: Secondary | ICD-10-CM | POA: Diagnosis not present

## 2016-07-12 DIAGNOSIS — D5919 Other autoimmune hemolytic anemia: Secondary | ICD-10-CM

## 2016-07-12 DIAGNOSIS — D649 Anemia, unspecified: Secondary | ICD-10-CM

## 2016-07-12 DIAGNOSIS — D5912 Cold autoimmune hemolytic anemia: Secondary | ICD-10-CM

## 2016-07-12 LAB — COMPREHENSIVE METABOLIC PANEL
ALBUMIN: 3.2 g/dL — AB (ref 3.5–5.0)
ALT: 20 U/L (ref 0–55)
ANION GAP: 8 meq/L (ref 3–11)
AST: 20 U/L (ref 5–34)
Alkaline Phosphatase: 160 U/L — ABNORMAL HIGH (ref 40–150)
BUN: 9.9 mg/dL (ref 7.0–26.0)
CALCIUM: 9.3 mg/dL (ref 8.4–10.4)
CHLORIDE: 98 meq/L (ref 98–109)
CO2: 27 meq/L (ref 22–29)
CREATININE: 0.6 mg/dL (ref 0.6–1.1)
EGFR: 79 mL/min/{1.73_m2} — AB (ref >90)
GLUCOSE: 104 mg/dL (ref 70–140)
POTASSIUM: 4.2 meq/L (ref 3.5–5.1)
Sodium: 134 mEq/L — ABNORMAL LOW (ref 136–145)
TOTAL PROTEIN: 7 g/dL (ref 6.4–8.3)
Total Bilirubin: 3.34 mg/dL — ABNORMAL HIGH (ref 0.20–1.20)

## 2016-07-12 LAB — CBC WITH DIFFERENTIAL/PLATELET
BASO%: 0.6 % (ref 0.0–2.0)
BASOS ABS: 0.1 10*3/uL (ref 0.0–0.1)
EOS ABS: 0.1 10*3/uL (ref 0.0–0.5)
EOS%: 1 % (ref 0.0–7.0)
HCT: 25.2 % — ABNORMAL LOW (ref 34.8–46.6)
HEMOGLOBIN: 9 g/dL — AB (ref 11.6–15.9)
LYMPH#: 2.4 10*3/uL (ref 0.9–3.3)
LYMPH%: 18.2 % (ref 14.0–49.7)
MCH: 34.4 pg — ABNORMAL HIGH (ref 25.1–34.0)
MCHC: 35.7 g/dL (ref 31.5–36.0)
MCV: 96.2 fL (ref 79.5–101.0)
MONO#: 1 10*3/uL — ABNORMAL HIGH (ref 0.1–0.9)
MONO%: 7.9 % (ref 0.0–14.0)
NEUT#: 9.4 10*3/uL — ABNORMAL HIGH (ref 1.5–6.5)
NEUT%: 72.3 % (ref 38.4–76.8)
NRBC: 0 % (ref 0–0)
PLATELETS: 449 10*3/uL — AB (ref 145–400)
RBC: 2.62 10*6/uL — ABNORMAL LOW (ref 3.70–5.45)
RDW: 20.1 % — AB (ref 11.2–14.5)
WBC: 13 10*3/uL — ABNORMAL HIGH (ref 3.9–10.3)

## 2016-07-12 LAB — TECHNOLOGIST REVIEW

## 2016-07-12 LAB — CHROMOSOME ANALYSIS, BONE MARROW

## 2016-07-12 NOTE — Progress Notes (Signed)
  [facility] OFFICE PROGRESS NOTE   Diagnosis: Cold agglutinin disease  INTERVAL HISTORY:   Brianna [name] returns as scheduled. I saw her when she was hospitalized earlier this month with hepatic abscesses and severe anemia. She was diagnosed with cold agglutinin disease. She received multiple transfusions while in the hospital. She was discharged to a skilled nursing facility and is completing physical therapy. She plans to be discharged to home in one week. No fever. She feels well. She underwent a bone marrow biopsy on 07/02/2016. Flow cytometry revealed a minor monoclonal B-cell population representing 6% of all cells. The B cells were kappa restricted with expression of CD20 and CD5. No CD10 expression. The bone marrow biopsy revealed a hypercellular marrow with several small lymphoid aggregates. Immunoperoxidase stains confirmed a B-cell population expressing CD5. There is a slight increase in interstitial plasma cells with Her restriction. These are not mixed with the lymphoid aggregates. There is abundance of erythroid precursors and increased numbers of megakaryocytes.   Objective:  Vital signs in last 24 hours:  Blood pressure 135/72, pulse (!) 106, temperature 98.6 F (37 C), temperature source Oral, resp. rate 18, height 5' 3.5" (1.613 m), weight 119 lb 3.2 oz (54.1 kg), SpO2 99 %.    HEENT: No thrush, mild scleral icterus Resp: Lungs clear bilaterally Cardio: Regular rate and rhythm GI: No hepatosplenomegaly, right upper quadrant liver drain with a few ccs of serosanguineous fluid in the drainage bulb Vascular: Trace pitting edema at the lower leg bilaterally      Lab Results:  Lab Results  Component Value Date   WBC 13.0 (H) 07/12/2016   HGB 9.0 (L) 07/12/2016   HCT 25.2 (L) 07/12/2016   MCV 96.2 07/12/2016   PLT 449 (H) 07/12/2016   NEUTROABS 9.4 (H) 07/12/2016   Potassium 4.2, crit 0.6, bilirubin 3.34, AST 20  Medications: I have reviewed the  patient's current medications.  Assessment/Plan: 1. Admission with fever and liver abscesses 06/19/2016  Status post placement of a liver drain on 06/21/2010, New drain placed 06/30/2016  Treated with broad-spectrum intravenous antibiotics, completing outpatient course of Augmentin   2. High titer cold agglutinin, serum monoclonal IgM kappa protein  Bone marrow biopsy 07/02/2016-07/05/2016 revealed reactive myeloid changes, few interstitial lymphoid aggregates with CD20/CD5 expression and a small clonal B-cell Or restricted population on flow cytometry, increased restricted interstitial plasma cells, probable early involvement with a low-grade lymphoproliferative disorder  3. 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 Hyperbilirubinemia secondary to hemolysis and liver abscesses   Disposition:  Brianna [name] appears stable. She is undergoing physical therapy at a skilled nursing facility following the recent hospital admission with liver abscesses. She has cold agglutinin related hemolytic anemia. The hemoglobin has stabilized since discharge from the hospital.  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 No indication for immunosuppressive therapy or chemotherapy at present.  She will complete the course of Augmentin as recommended by Dr. Johnnye Sima. She is scheduled for a CT to follow up to liver abscesses on 07/20/2016.  Brianna [name] will return for a CBC 07/23/2016 and an office visit 08/09/2016.  Betsy Coder, MD  07/12/2016  12:46 PM

## 2016-07-12 NOTE — Telephone Encounter (Signed)
per pof to sch pt appt-gave pt copy of avs/cal °

## 2016-07-13 ENCOUNTER — Inpatient Hospital Stay: Payer: Medicare Other | Admitting: Oncology

## 2016-07-20 ENCOUNTER — Ambulatory Visit
Admission: RE | Admit: 2016-07-20 | Discharge: 2016-07-20 | Disposition: A | Discharge status: Home / Self Care | Payer: Medicare Other | Source: Ambulatory Visit | Attending: Oncology | Admitting: Oncology

## 2016-07-20 ENCOUNTER — Ambulatory Visit
Admission: RE | Admit: 2016-07-20 | Discharge: 2016-07-20 | Disposition: A | Discharge status: Home / Self Care | Payer: Medicare Other | Source: Ambulatory Visit | Attending: Radiology | Admitting: Radiology

## 2016-07-20 DIAGNOSIS — K750 Abscess of liver: Secondary | ICD-10-CM

## 2016-07-20 HISTORY — PX: IR GENERIC HISTORICAL: IMG1180011

## 2016-07-20 MED ORDER — IOPAMIDOL (ISOVUE-300) INJECTION 61%
100.0000 mL | Freq: Once | INTRAVENOUS | Status: AC | PRN
  Administered 2016-07-20: 100 mL via INTRAVENOUS

## 2016-07-20 NOTE — Progress Notes (Signed)
Chief Complaint: Patient was seen in consultation today for hepatic abscess drain check at the request of Dr Lita Mains  Referring Physician(s): Dr Lita Mains  Supervising Physician: [name]  History of Present Illness: Brianna [name] is a 82 y.o. female   Hepatic abscess drain placement 06/21/16 Exchanged 06/30/2016 Recheck 07/06/2016 Presents today for another recheck Last dose Augmentin today Denies fever or pain Output of drain is minimal---actually "leaking: at site occasionally RN at facility flushes 2-3x/daily 5 cc each time; output 5-10 cc daily; some days none CT today has been reviewed with Dr Laurence Ferrari Abscess resolved  Past Medical History:  Diagnosis Date  . Bladder infection   . Hypertension   . Pneumonia   . Thoracic vertebral fracture ([place])    T 11, unsure type of fracture    Past Surgical History:  Procedure Laterality Date  . APPENDECTOMY    . FRACTURE SURGERY      Allergies: Other; Compazine [prochlorperazine]; Erythromycin; Lyrica [pregabalin]; Promethazine; Tramadol; and Vistaril [hydroxyzine hcl]  Medications: Prior to Admission medications   Medication Sig Start Date End Date Taking? Authorizing Provider  acetaminophen (TYLENOL) 500 MG tablet Take 1 tablet (500 mg total) by mouth every 6 (six) hours as needed for mild pain, moderate pain, fever or headache. 07/06/16   Clanford Marisa Hua, MD  ALPRAZolam Duanne Moron) 0.5 MG tablet Take 1 tablet (0.5 mg total) by mouth at bedtime as needed for anxiety. 07/06/16   Clanford Marisa Hua, MD  amoxicillin-clavulanate (AUGMENTIN) 875-125 MG tablet Take 1 tablet by mouth 2 (two) times daily. 07/06/16 07/20/16  Clanford Marisa Hua, MD  conjugated estrogens (PREMARIN) vaginal cream Place 1 Applicatorful vaginally 3 (three) times a week.    Historical Provider, MD  diclofenac sodium (VOLTAREN) 1 % GEL Apply 2 g topically 4 (four) times daily. 07/06/16   Clanford Marisa Hua, MD  diphenhydrAMINE  (BENADRYL) 25 MG tablet Take 25 mg by mouth at bedtime as needed for sleep.     Historical Provider, MD  famotidine (PEPCID) 10 MG tablet Take 10 mg by mouth daily as needed for heartburn or indigestion.    Historical Provider, MD  folic acid (FOLVITE) 1 MG tablet Take 1 tablet (1 mg total) by mouth daily. 07/06/16   Clanford Marisa Hua, MD  metoprolol succinate (TOPROL-XL) 25 MG 24 hr tablet Take 1 tablet (25 mg total) by mouth daily. 12/13/13   Robbie Lis, MD  Multiple Vitamin (MULTIVITAMIN WITH MINERALS) TABS tablet Take 1 tablet by mouth daily.    Historical Provider, MD  nutrition supplement, JUVEN, (JUVEN) PACK Take 2 packets by mouth 2 (two) times daily between meals. 07/06/16   Clanford Marisa Hua, MD  ondansetron (ZOFRAN) 4 MG tablet Take 1 tablet (4 mg total) by mouth every 8 (eight) hours as needed for nausea or vomiting. 07/06/16   Clanford Marisa Hua, MD  Polyethyl Glycol-Propyl Glycol (SYSTANE OP) Apply 2 drops to eye 3 (three) times daily as needed (dry eyes).    Historical Provider, MD  tamsulosin (FLOMAX) 0.4 MG CAPS capsule Take 1 capsule (0.4 mg total) by mouth daily after breakfast. 07/06/16   Clanford Marisa Hua, MD     Family History  Problem Relation Age of Onset  . Heart disease Mother   . Heart disease Father   . Hypertension Sister   . Stroke Sister   . Cancer Sister     Social History   Social History  . Marital status: Widowed    Spouse name:  N/A  . Number of children: N/A  . Years of education: N/A   Social History Main Topics  . Smoking status: Never Smoker  . Smokeless tobacco: Never Used  . Alcohol use Yes     Comment: 1 glass wine per day  . Drug use: No  . Sexual activity: Not on file   Other Topics Concern  . Not on file   Social History Narrative  . No narrative on file     Review of Systems: A 12 point ROS discussed and pertinent positives are indicated in the HPI above.  All other systems are negative.  Review of Systems  Constitutional:  Negative for activity change, appetite change, fever and unexpected weight change.  Gastrointestinal: Negative for abdominal pain.  Neurological: Positive for weakness.  Psychiatric/Behavioral: Negative for behavioral problems.    Vital Signs: BP (!) 148/71 (BP Location: Right Arm)   Pulse (!) 107   Temp 98.4 F (36.9 C) (Oral)   SpO2 98%   Physical Exam  Abdominal: Soft. Bowel sounds are normal. There is no tenderness.  Skin: Skin is warm and dry. No erythema.  Site of abscess drain is clean and dry NT no bleeding No output in JP   Nursing note and vitals reviewed.  Drain removal without difficulty    Imaging: Ct Abdomen Pelvis W Contrast  Result Date: 07/04/2016 CLINICAL DATA:  Follow-up known liver abscesses. EXAM: CT ABDOMEN AND PELVIS WITH CONTRAST TECHNIQUE: Multidetector CT imaging of the abdomen and pelvis was performed using the standard protocol following bolus administration of intravenous contrast. CONTRAST:  130m ISOVUE-300 IOPAMIDOL (ISOVUE-300) INJECTION 61% COMPARISON:  June 19, 2016 and June 25, 2016 FINDINGS: Bilateral pleural effusions and atelectasis are stable since June 25, 2016. The lung bases are otherwise unchanged. No free air. Free fluid in the pelvis is increased in the interval. There is rim enhancement around a pocket of fluid in the right pelvis on series 3, image 61, measuring 3 by 1.6 cm. No abnormal fluid otherwise seen in the abdomen. The percutaneous drainage catheter remains in the liver. The pigtail is now within the more posterior abscess. This abscess is smaller in the interval measuring 3.3 x 3.5 cm today versus 4.8 by 4.2 cm previously. The more peripheral abscess is also being appropriately drained and is also smaller in the interval. There is a track of low attenuation extending from the more peripheral abscess into the posterior right hepatic lobe which was not seen previously. There is also a small region of low attenuation in the hepatic dome  on axial image 11, not clearly seen previously. The pigtail catheter originally ended in this region of the medial hepatic dome on the June 25, 2016 study. Periportal edema remains. The portal vein is patent. A subcapsular hepatic collection on series 3, image 25 remains measuring 3.8 by 1.1 cm today versus 6.7 by 1.1 cm previously. The gallbladder is distended to a similar degree compared to June 25, 2016. The spleen and adrenal glands are normal. An indeterminate low-attenuation lesion measuring 17 mm in the left kidney on image 28 is stable. Several renal cysts are noted. Three low-attenuation rounded regions of the pancreas are stable. Atherosclerosis is seen in the abdominal aorta. There is a small hiatal hernia. The stomach is fluid filled and mildly distended but there is no convincing evidence of outlet obstruction. The small bowel is normal. The colon is normal. There is no secondary evidence of appendicitis. Free fluid in the pelvis. Rim enhancing fluid  to collection identified in the right posterior pelvis. No adenopathy or mass. No other pelvic changes. Significant subcutaneous edema remains but has improved in the interval. Delayed images demonstrate no filling defects in the upper renal collecting systems. Significant off loss of height of T11, unchanged. Mild loss of height of L1 is also unchanged. IMPRESSION: 1. The previously seen abscesses in the liver are smaller in the interval. There is a small region of low attenuation near the medial hepatic dome which was the site of the pigtail catheter on the previous study. This could be postprocedural. It would be difficult to exclude a developing abscess in this region. Recommend attention on follow-up. There is also a track of low attenuation extending from the more anterior superficial abscess into the posterior hepatic dome. Extension of infection is not excluded but the track like appearance raises the possibility of postprocedural change. Recommend  attention on followup. 2. Rim enhancing collection of fluid in the right posterior pelvis concerning for developing abscess. 3. The subcapsular fluid collection over the right hepatic lobe is smaller in the interval. 4. Stable indeterminate lesion in the left kidney. 5. Stable low-attenuation lesions in the pancreas. 6. Stable compression fractures in the spine. Electronically Signed   By: Dorise Bullion III M.D   On: 07/04/2016 12:13  Ct Abdomen Pelvis W Contrast  Result Date: 06/25/2016 CLINICAL DATA:  82 year old female with hypertension. Jaundice. Loss of weight. Fevers. EXAM: CT ABDOMEN AND PELVIS WITH CONTRAST TECHNIQUE: Multidetector CT imaging of the abdomen and pelvis was performed using the standard protocol following bolus administration of intravenous contrast. CONTRAST:  164m ISOVUE-300 IOPAMIDOL (ISOVUE-300) INJECTION 61% COMPARISON:  06/19/2016 FINDINGS: Lower chest: There are bilateral pleural effusions, right greater than left. These appear increased in volume when compared with previous exam. Hepatobiliary: Liver abscess ease are again identified. A percutaneous drainage catheter is in place. The larger of the 2 abscesses measures 4.8 cm, image number 48 of series 2. Previously 4.7 cm. The more lateral abscess measures 3.7 cm, image 34 of series 2. Previously 4.2 cm. No new fluid collections identified. Pancreas: There are a few scattered low-density lesions within the pancreas which are not changed from previous exam. The largest is in the distal tail measuring 7 mm, image 43 of series 2. Spleen: The spleen appears normal. Adrenals/Urinary Tract: Indeterminate, intermediate attenuating lesion in the mid left kidney measures 1.7 cm and 39 HU, image 45 of series 2. Unchanged from previous study. The bladder is normal. Stomach/Bowel: The stomach is normal. There is no pathologic dilatation of the small bowel loops. Moderate stool burden is identified throughout the colon. No pathologic  dilatation of the large bowel loops identified. Vascular/Lymphatic: Calcified atherosclerotic disease involves the abdominal aorta. No aneurysm. No enlarged retroperitoneal or mesenteric adenopathy. No enlarged pelvic or inguinal lymph nodes. Reproductive: The uterus and adnexal structures are unremarkable. Other: There is a small amount of ascites noted within the dependent portion of the pelvis. The diffuse body wall edema is identified. A small amount of perihepatic ascites noted. Musculoskeletal: No aggressive lytic or sclerotic bone lesions. The bones appear osteopenic. There is a stable compression fractures involve T11 and L1. Degenerative disc disease is noted at L4-5. IMPRESSION: 1. Two liver abscesses are again identified within the right lobe. Overall there has been no significant change in size of these complex fluid collections. The percutaneous drainage catheter remains in appropriate position. 2. Similar appearance of indeterminate low-attenuation lesions within the spleen an intermediate attenuating foci within the left  kidney. Although favored to represent benign foci these will require nonemergent follow-up imaging with contrast enhanced MRI of the upper abdomen per for bili performed following resolution of the patient's current clinical condition on an outpatient basis. 3. Aortic atherosclerosis 4. Increase in pleural effusions and body wall edema. Electronically Signed   By: Kerby Moors M.D.   On: 06/25/2016 18:25   Ct Abscess Cath Exchange  Result Date: 07/01/2016 INDICATION: Status post percutaneous drainage of 2 separate hepatic abscesses on 06/21/2016 with placement of an elongated drainage catheter across both collections. There has been worsening elevation of white blood cell count with CT on 06/25/2016 demonstrating slight reduction in a lateral right hepatic abscess and minimal change in a medial right lobe hepatic abscess. EXAM: CT ABSCESS CATH EXCHANGE MEDICATIONS: The patient  is currently admitted to the hospital and receiving intravenous antibiotics. The antibiotics were administered within an appropriate time frame prior to the initiation of the procedure. ANESTHESIA/SEDATION: Fentanyl 50 mcg IV; Versed 3.0 mg IV Moderate Sedation Time:  62 minutes. The patient was continuously monitored during the procedure by the interventional radiology nurse under my direct supervision. COMPLICATIONS: None immediate. PROCEDURE: Informed written consent was obtained from the patient after a thorough discussion of the procedural risks, benefits and alternatives. All questions were addressed. Maximal Sterile Barrier Technique was utilized including caps, mask, sterile gowns, sterile gloves, sterile drape, hand hygiene and skin antiseptic. A timeout was performed prior to the initiation of the procedure. The pre-existing exiting hepatic drainage catheter and surrounding skin were prepped with chlorhexidine. During the procedure, 1% lidocaine was infiltrated to provide local anesthesia around the tube exit site. The current drainage catheter course was imaged by CT. The catheter was cut and removed over a guidewire. A 5 French diagnostic catheter was then advanced into the liver parenchyma. The catheter and guidewire were manipulated to gain wire access into the deeper, more medial hepatic abscess. A new 12 French drainage catheter was then advanced over the wire and into the collection. The catheter positioning was confirmed by CT. The catheter was flushed with saline and connected to a suction bulb. It was secured at the skin with a Prolene retention suture and StatLock device. The rest of the procedure was spent trying to gain separate access into the lateral hepatic abscess. 18 gauge trocar needles were advanced into this collection and guidewires advanced as well as attempts at aspiration. Ultimately, attempt to place a second new hepatic abscess drainage catheter were aborted. FINDINGS: Based on  unenhanced CT images obtained, the lateral right lobe hepatic abscess clearly is significantly smaller than on the imaging obtained at the time of initial drainage. Maximal diameter of this area of residual collections/edema has decreased from approximately 4.3 cm down to 2.6 cm. The medial abscess is also smaller than original size with maximal diameter of approximately 4.1 cm compared to 4.7 cm prior to drainage. The pre-existing multi side-hole drainage catheter extends through both collections and terminates in the medial dome of the liver. With catheter exchange, there with success in placing a standard 12 French pigtail drainage catheter into the larger medial collection with return of some debris and bloody fluid. With multiple separate needle accesses of the more lateral collection, there was inability to aspirate free fluid. Guidewire advancement extended through the parenchyma and did not coil within the collection to allow placement of a second drain. Based on decreased in size as well as inability to accurately place a second drain in this collection, attempts at  further new drainage catheter placement were aborted. This area appears to be resolving and will be followed while the patient receives appropriate antibiotic therapy. IMPRESSION: 1. Successful exchange of the elongated multi side-hole drain placed initially with a standard 12 French pigtail drain formed in the larger residual medial right lobe hepatic abscess. This drain will be connected to suction bulb drainage and flushed 3 times daily. 2. The lateral hepatic abscess shows more significant size reduction after initial drainage. Attempts at placing a second drain in this collection were unsuccessful due to lack of liquefaction and inability to coiled a wire within the collection. This may be essentially resolved from a drainage perspective with some residual edema/fluid remaining. This collection will be followed as well as the remaining  abscess containing a drain while the patient is receiving appropriate antibiotic therapy. CT resolution of hepatic collections with drainage can take weeks to months and once the patient is able to be discharged from the hospital, abscess drainage catheter management and status of collections can be followed in the IR drain Clinic. Electronically Signed   By: Aletta Edouard M.D.   On: 07/01/2016 09:01   Ct Biopsy  Result Date: 07/02/2016 INDICATION: 82 year old female with anemia and leukocytosis. EXAM: CT GUIDED BONE MARROW ASPIRATION AND CORE BIOPSY Interventional Radiologist:  Criselda Peaches, MD MEDICATIONS: None. ANESTHESIA/SEDATION: Moderate (conscious) sedation was employed during this procedure. A total of 1.5 milligrams versed and 100 micrograms fentanyl were administered intravenously. The patient's level of consciousness and vital signs were monitored continuously by radiology nursing throughout the procedure under my direct supervision. Total monitored sedation time: 8 minutes FLUOROSCOPY TIME:  None COMPLICATIONS: None immediate. Estimated blood loss: <25 mL PROCEDURE: Informed written consent was obtained from the patient after a thorough discussion of the procedural risks, benefits and alternatives. All questions were addressed. Maximal Sterile Barrier Technique was utilized including caps, mask, sterile gowns, sterile gloves, sterile drape, hand hygiene and skin antiseptic. A timeout was performed prior to the initiation of the procedure. The patient was positioned prone and non-contrast localization CT was performed of the pelvis to demonstrate the iliac marrow spaces. Maximal barrier sterile technique utilized including caps, mask, sterile gowns, sterile gloves, large sterile drape, hand hygiene, and betadine prep. Under sterile conditions and local anesthesia, an 11 gauge coaxial bone biopsy needle was advanced into the left iliac marrow space. Needle position was confirmed with CT  imaging. Initially, bone marrow aspiration was performed. Next, the 11 gauge outer cannula was utilized to obtain a left iliac bone marrow core biopsy. Needle was removed. Hemostasis was obtained with compression. The patient tolerated the procedure well. Samples were prepared with the cytotechnologist. IMPRESSION: Successful CT-guided bone marrow biopsy. Electronically Signed   By: [name] M.D.   On: 07/02/2016 11:54   Dg Chest Port 1 View  Result Date: 06/24/2016 CLINICAL DATA:  Shortness of breath today. EXAM: PORTABLE CHEST 1 VIEW COMPARISON:  06/19/2016. FINDINGS: 0950 hours. Patient is rotated to the right. Low volume film with bibasilar atelectasis or infiltrate and small bilateral pleural effusions. The cardio pericardial silhouette is enlarged. Bones are diffusely demineralized. Telemetry leads overlie the chest. Pigtail catheter over the right upper quadrant is compatible with recent hepatic abscess drainage. IMPRESSION: Rotated film with vascular congestion and bibasilar atelectasis or infiltrate. Small bilateral pleural effusions. Electronically Signed   By: Misty Stanley M.D.   On: 06/24/2016 10:10   Ct Image Guided Drainage Percut Cath  Peritoneal Retroperit  Result Date: 06/21/2016 CLINICAL DATA:  past medical history of hypertension, T 11 fracture who was admitted to the hospital on 7/8 with malaise, fever, intermittent abdominal/substernal discomfort, diminished appetite/oral intake, weakness, jaundice and occasional nausea. Subsequent laboratory evaluation revealed marked leukocytosis, as well as anemia and elevated LFTs. She has received blood transfusion. Current laboratory values reveal WBC of 36.8, hemoglobin 10.2, platelets 339 k, creatinine 0.49, total bilirubin 9.8 , PT 15.5, INR 1.22 . CT of the abdomen and pelvis on 7/8 revealed 2 low attenuation areas in the right hepatic lobe which are new since December 2014 and suspicious for hepatic abscesses and less likely  neoplasm. Request now received for CT guided drainage of the above-mentioned abscesses. " EXAM: CT AND ULTRASOUND GUIDED DRAINAGE OF LIVER ABSCESSES ANESTHESIA/SEDATION: Intravenous Fentanyl and Versed were administered as conscious sedation during continuous monitoring of the patient's level of consciousness and physiological / cardiorespiratory status by the radiology RN, with a total moderate sedation time of 25 minutes. PROCEDURE: The procedure, risks, benefits, and alternatives were explained to the patient. Questions regarding the procedure were encouraged and answered. The patient understands and consents to the procedure. The lesions were better localized with ultrasound so survey ultrasound of the level was used to determine appropriate skin entry site. The operative field was prepped with chlorhexidinein a sterile fashion, and a sterile drape was applied covering the operative field. A sterile gown and sterile gloves were used for the procedure. Local anesthesia was provided with 1% Lidocaine. Under real-time ultrasound guidance, a 25 gauge trocar needle was advanced through the segment 5 lesion into the segment 7 lesion. Purulent material could be aspirated. An Amplatz guidewire advanced easily. Limited CT was performed to confirm appropriate needle a guidewire positioning. The tract was then dilated to allow placement of a 12 French multi side-hole biliary drain catheter, positioned with the distal sideholes and the segment 7 collection, proximal side holes in the segment 5 collection. A total of 75 mL purulent material were aspirated. A sample was sent for Gram stain, culture and sensitivity. Catheter was secured externally with 0 Prolene suture and StatLock, and placed to gravity drain after flushing. The patient tolerated the procedure well. COMPLICATIONS: None immediate FINDINGS: Two complex right lobe fluid collections were identified on ultrasound corresponding to the lesions seen on prior CT.  Multi side hole drain catheter was placed across both lesions as detailed above. IMPRESSION: 1. Technically successful CT-guided drain catheter placement across 2 right lobe hepatic abscesses. Electronically Signed   By: Lucrezia Europe M.D.   On: 06/21/2016 17:30    Labs:  CBC:  Recent Labs  07/02/16 0350 07/04/16 0638 07/05/16 0346 07/12/16 1149  WBC 23.1* 18.0* 16.8* 13.0*  HGB 6.6* 9.1* 9.0* 9.0*  HCT 18.8* 26.8* 26.1* 25.2*  PLT 746* PLATELET CLUMPS NOTED ON SMEAR, COUNT APPEARS INCREASED 608* 449*    COAGS:  Recent Labs  06/20/16 0231 06/20/16 1330  INR 1.31 1.22    BMP:  Recent Labs  07/01/16 0401 07/02/16 0350 07/03/16 0518 07/05/16 0346 07/12/16 1149  NA 131* 133* 132* 133* 134*  K 4.5 3.5 4.2 3.5 4.2  CL 96* 97* 100* 101  --   CO2 _0 GLUCOSE 95 103* 98 97 104  BUN _1 9.9  CALCIUM 7.8* 7.8* 8.0* 7.9* 9.3  CREATININE 0.47 0.44 0.43* 0.40* 0.6  GFRNONAA >60 >60 >60 >60  --   GFRAA >60 >60 >60 >60  --     LIVER FUNCTION TESTS:  Recent  Labs  07/02/16 0350 07/03/16 0518 07/05/16 0346 07/12/16 1149  BILITOT 3.6* 3.1* 2.8* 3.34*  AST 29 34 18 20  ALT 42 34 25 20  ALKPHOS 173* 169* 144* 160*  PROT 5.5* 5.8* 5.7* 7.0  ALBUMIN 2.4* 2.6* 2.6* 3.2*    TUMOR MARKERS:  Recent Labs  06/20/16 1330  AFPTM 7.9    Assessment:  Hepatic abscess resolution Drain removal per Dr Laurence Ferrari No follow up Pt and dtr have good understanding of plan   Electronically Signed: Maziah Smola A 07/20/2016, 1:49 PM   Please refer to Dr. Laurence Ferrari attestation of this note for management and plan.

## 2016-07-21 ENCOUNTER — Encounter (HOSPITAL_COMMUNITY): Payer: Self-pay

## 2016-07-22 ENCOUNTER — Telehealth: Payer: Self-pay | Admitting: *Deleted

## 2016-07-22 NOTE — Telephone Encounter (Signed)
TC from pt's daughter cancelling appt for tomorrow for labs. Pt is in rehab and labs will be done there. Results to be faxed to this clinic.

## 2016-07-23 ENCOUNTER — Other Ambulatory Visit: Payer: Medicare Other

## 2016-07-23 ENCOUNTER — Telehealth: Payer: Self-pay | Admitting: *Deleted

## 2016-07-23 ENCOUNTER — Encounter: Payer: Self-pay | Admitting: *Deleted

## 2016-07-23 NOTE — Telephone Encounter (Signed)
No note

## 2016-07-23 NOTE — Telephone Encounter (Signed)
rec'd call from daughter Lenord Carbo. She states patient is currently in [facility] for re-hab. Patient had labs drawn on 07-20-16 and today 07-23-16. I called nursing home and spoke with nurse shanlyn and asked her to fax a copy of each of those labs to the cancer center for dr sherrill's review.

## 2016-08-06 ENCOUNTER — Ambulatory Visit (INDEPENDENT_AMBULATORY_CARE_PROVIDER_SITE_OTHER): Payer: Medicare Other | Admitting: Infectious Diseases

## 2016-08-06 ENCOUNTER — Encounter: Payer: Self-pay | Admitting: Infectious Diseases

## 2016-08-06 DIAGNOSIS — K750 Abscess of liver: Secondary | ICD-10-CM | POA: Diagnosis not present

## 2016-08-06 DIAGNOSIS — D591 Other autoimmune hemolytic anemias: Secondary | ICD-10-CM | POA: Diagnosis not present

## 2016-08-06 DIAGNOSIS — D5919 Other autoimmune hemolytic anemia: Secondary | ICD-10-CM

## 2016-08-06 NOTE — Assessment & Plan Note (Signed)
She appears to be doing well, greatly appreciate Dr Gearldine Shown f/u.

## 2016-08-06 NOTE — Progress Notes (Signed)
   Subjective:    Patient ID: Brianna [name], female    DOB: February 17, 1924, 82 y.o.   MRN: [id number]  HPI 82 yo F with hx of HTN and diverticulitis, who "passed out" 7-5 at home. She was seen in ED and had CT head that was (-). H/H 9.6/28.8. WBC 19.4 She returned to health system on 7-8 with jaundice, dark urine, nausea and anorexia. Her temp was noted to be 103.  H/H 7.2/20.3. WBC 40.0. T bili 7, ALT 216/AST 148.  She was started on vanco/zosyn.  She underwent u/s and was found to have 2 liver lesions. These were seen on CT as well and were felt to be consistent with abscess. She underwent IR guided drain placement on 7-10 showing purulent material. Her Cx grew "few anaerobes, beta lactamase negative".  Hercourse has been further complicated by finding cold agglutinins as the cause of her anemia.   Her last colonoscopy was 2-3 yr ago in [place]. Pt decided told not to eat corn, nuts, or seeds. Diverticulitis has not been seen on CT.    She did well in hospital and was d/c home (7-25) on unasyn for 2 additional weeks. She was seen in IR on 8-8 and had a repeat CT- no abscess. She was off anbx, her abscess was felt to be resolved and her drain was removed.    Is annoyed that she is not able to "go faster".  Is getting home health coming in [place].   Review of Systems  Constitutional: Positive for chills. Negative for appetite change, fever and unexpected weight change.  Gastrointestinal: Negative for abdominal pain, constipation and diarrhea.  Genitourinary: Negative for difficulty urinating.  has lost 16-17# since illness.  Has muscular pain in abd from PT.  Icterus resolved.     Objective:   Physical Exam  Constitutional: She appears well-developed and well-nourished.  HENT:  Mouth/Throat: No oropharyngeal exudate.  Eyes: EOM are normal. Pupils are equal, round, and reactive to light. No scleral icterus.  Neck: Neck supple.  Cardiovascular: Normal rate, regular rhythm and normal heart  sounds.   Pulmonary/Chest: Effort normal and breath sounds normal.  Abdominal: Soft. Bowel sounds are normal. She exhibits no distension. There is no tenderness. There is no rebound.  Musculoskeletal: She exhibits no edema.  Lymphadenopathy:    She has no cervical adenopathy.       Assessment & Plan:

## 2016-08-06 NOTE — Assessment & Plan Note (Signed)
She is doing very well.  I encouraged to continue her PT, strength building.  She asks about taking tylenol- I suggested to take this very sparingly or to consider NSSAID with food sparingly instead.  She asks ETOH/wine- I suggest she limit this to 1 glass/day She can return to see me prn.

## 2016-08-09 ENCOUNTER — Ambulatory Visit: Payer: Medicare Other

## 2016-08-09 ENCOUNTER — Ambulatory Visit: Payer: Medicare Other | Admitting: Nurse Practitioner

## 2016-08-09 ENCOUNTER — Encounter: Payer: Medicare Other | Admitting: Nurse Practitioner

## 2016-08-09 ENCOUNTER — Other Ambulatory Visit: Payer: Medicare Other

## 2016-08-10 ENCOUNTER — Other Ambulatory Visit (HOSPITAL_BASED_OUTPATIENT_CLINIC_OR_DEPARTMENT_OTHER): Payer: Medicare Other

## 2016-08-10 ENCOUNTER — Telehealth: Payer: Self-pay | Admitting: Oncology

## 2016-08-10 ENCOUNTER — Ambulatory Visit (HOSPITAL_BASED_OUTPATIENT_CLINIC_OR_DEPARTMENT_OTHER): Payer: Medicare Other | Admitting: Oncology

## 2016-08-10 VITALS — BP 141/68 | HR 88 | Temp 98.5°F | Resp 18 | Ht 63.5 in | Wt 110.8 lb

## 2016-08-10 DIAGNOSIS — D5912 Cold autoimmune hemolytic anemia: Secondary | ICD-10-CM

## 2016-08-10 DIAGNOSIS — D638 Anemia in other chronic diseases classified elsewhere: Secondary | ICD-10-CM | POA: Diagnosis not present

## 2016-08-10 DIAGNOSIS — D591 Other autoimmune hemolytic anemias: Secondary | ICD-10-CM | POA: Diagnosis present

## 2016-08-10 LAB — CBC & DIFF AND RETIC
BASO%: 0.5 % (ref 0.0–2.0)
Basophils Absolute: 0.1 10*3/uL (ref 0.0–0.1)
EOS%: 0.4 % (ref 0.0–7.0)
Eosinophils Absolute: 0 10*3/uL (ref 0.0–0.5)
HEMATOCRIT: 31.5 % — AB (ref 34.8–46.6)
HGB: 10.9 g/dL — ABNORMAL LOW (ref 11.6–15.9)
Immature Retic Fract: 1 % — ABNORMAL LOW (ref 1.60–10.00)
LYMPH#: 1.5 10*3/uL (ref 0.9–3.3)
LYMPH%: 14.7 % (ref 14.0–49.7)
MCH: 33.9 pg (ref 25.1–34.0)
MCHC: 34.6 g/dL (ref 31.5–36.0)
MCV: 97.8 fL (ref 79.5–101.0)
MONO#: 0.6 10*3/uL (ref 0.1–0.9)
MONO%: 5.6 % (ref 0.0–14.0)
NEUT%: 78.8 % — ABNORMAL HIGH (ref 38.4–76.8)
NEUTROS ABS: 8.1 10*3/uL — AB (ref 1.5–6.5)
PLATELETS: 371 10*3/uL (ref 145–400)
RBC: 3.22 10*6/uL — AB (ref 3.70–5.45)
RDW: 14.8 % — ABNORMAL HIGH (ref 11.2–14.5)
RETIC CT ABS: 119.78 10*3/uL — AB (ref 33.70–90.70)
Retic %: 3.72 % — ABNORMAL HIGH (ref 0.70–2.10)
WBC: 10.3 10*3/uL (ref 3.9–10.3)
nRBC: 0 % (ref 0–0)

## 2016-08-10 LAB — COMPREHENSIVE METABOLIC PANEL
ALBUMIN: 3.6 g/dL (ref 3.5–5.0)
ALK PHOS: 77 U/L (ref 40–150)
ALT: 13 U/L (ref 0–55)
ANION GAP: 10 meq/L (ref 3–11)
AST: 15 U/L (ref 5–34)
BILIRUBIN TOTAL: 1.96 mg/dL — AB (ref 0.20–1.20)
BUN: 15.7 mg/dL (ref 7.0–26.0)
CALCIUM: 9.8 mg/dL (ref 8.4–10.4)
CO2: 28 mEq/L (ref 22–29)
CREATININE: 0.8 mg/dL (ref 0.6–1.1)
Chloride: 100 mEq/L (ref 98–109)
EGFR: 64 mL/min/{1.73_m2} — ABNORMAL LOW (ref >90)
Glucose: 119 mg/dl (ref 70–140)
POTASSIUM: 4.8 meq/L (ref 3.5–5.1)
Sodium: 138 mEq/L (ref 136–145)
Total Protein: 7.5 g/dL (ref 6.4–8.3)

## 2016-08-10 NOTE — Progress Notes (Signed)
  [facility] OFFICE PROGRESS NOTE   Diagnosis: Cold agglutinin disease  INTERVAL HISTORY:   Ms. Mclaurin returns as scheduled. She continues to have malaise, but she feels better compared to hospital admission. She is living at home. Good appetite.  Objective:  Vital signs in last 24 hours:  Blood pressure (!) 141/68, pulse 88, temperature 98.5 F (36.9 C), temperature source Oral, resp. rate 18, height 5' 3.5" (1.613 m), weight 110 lb 12.8 oz (50.3 kg), SpO2 98 %.    HEENT: ? Mild scleral icterus Lymphatics: No cervical or supraclavicular nodes Resp: Lungs clear bilaterally Cardio: Regular rate and rhythm GI: No hepatosplenomegaly, nontender Vascular: No leg edema   Lab Results:  Lab Results  Component Value Date   WBC 10.3 08/10/2016   HGB 10.9 (L) 08/10/2016   HCT 31.5 (L) 08/10/2016   MCV 97.8 08/10/2016   PLT 371 08/10/2016   NEUTROABS 8.1 (H) 08/10/2016   Bilirubin 1.96, albumin 3.6  Medications: I have reviewed the patient's current medications.  Assessment/Plan: 1. Admission with fever and liver abscesses 06/19/2016  Status post placement of a liver drain on 06/21/2010, New drain placed 06/30/2016  Treated with broad-spectrum intravenous antibiotics, completing outpatient course of Augmentin   CT 07/20/2016-no residual abscess identified,  2. 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 Hyperbilirubinemia secondary to hemolysis and liver abscesses-improved    Disposition:  Ms. Star continues to recover from  the recent admission with liver abscesses. The hemoglobin and hyperbilirubinemia are improved today.  We will continue observation. She will return for an office visit and CBC in one month.  Betsy Coder, MD  08/10/2016  3:43 PM

## 2016-08-10 NOTE — Telephone Encounter (Signed)
GAVE PATIENT AVS REPORT AND APPOINTMENTS FOR September. PER 8/29 LOS RTN 9/26 FOR LAB. NO F/U INFORMATION GIVEN.

## 2016-08-11 ENCOUNTER — Telehealth: Payer: Self-pay

## 2016-08-11 NOTE — Telephone Encounter (Signed)
Pt called that an MD appt was not scheduled for 9/26 with the lab appt. In basket sent per OV note.

## 2016-08-13 ENCOUNTER — Telehealth: Payer: Self-pay | Admitting: Oncology

## 2016-08-13 NOTE — Telephone Encounter (Signed)
PER DESK NURSE ADJUSTED 9.26 LAB/FU TO 10:30 AM AND 11 AM. SPOKE WITH PATIENT DTR ANITA SHE IS AWARE.

## 2016-08-13 NOTE — Telephone Encounter (Signed)
Spoke with daughter to confirm lab/ov 9/26 at 10 am

## 2016-09-07 ENCOUNTER — Telehealth: Payer: Self-pay | Admitting: Oncology

## 2016-09-07 ENCOUNTER — Other Ambulatory Visit (HOSPITAL_BASED_OUTPATIENT_CLINIC_OR_DEPARTMENT_OTHER): Payer: Medicare Other

## 2016-09-07 ENCOUNTER — Ambulatory Visit (HOSPITAL_BASED_OUTPATIENT_CLINIC_OR_DEPARTMENT_OTHER): Payer: Medicare Other | Admitting: Oncology

## 2016-09-07 VITALS — BP 149/57 | HR 82 | Temp 98.9°F | Resp 18 | Ht 63.0 in | Wt 112.7 lb

## 2016-09-07 DIAGNOSIS — Z23 Encounter for immunization: Secondary | ICD-10-CM | POA: Diagnosis not present

## 2016-09-07 DIAGNOSIS — D5912 Cold autoimmune hemolytic anemia: Secondary | ICD-10-CM

## 2016-09-07 DIAGNOSIS — D638 Anemia in other chronic diseases classified elsewhere: Secondary | ICD-10-CM

## 2016-09-07 DIAGNOSIS — D5919 Other autoimmune hemolytic anemia: Secondary | ICD-10-CM

## 2016-09-07 DIAGNOSIS — D591 Other autoimmune hemolytic anemias: Secondary | ICD-10-CM

## 2016-09-07 LAB — CBC WITH DIFFERENTIAL/PLATELET
BASO%: 0.4 % (ref 0.0–2.0)
BASOS ABS: 0.1 10*3/uL (ref 0.0–0.1)
EOS%: 0.3 % (ref 0.0–7.0)
Eosinophils Absolute: 0 10*3/uL (ref 0.0–0.5)
HCT: 29.4 % — ABNORMAL LOW (ref 34.8–46.6)
HEMOGLOBIN: 10.2 g/dL — AB (ref 11.6–15.9)
LYMPH#: 1.6 10*3/uL (ref 0.9–3.3)
LYMPH%: 12.1 % — ABNORMAL LOW (ref 14.0–49.7)
MCH: 33.2 pg (ref 25.1–34.0)
MCV: 95.8 fL (ref 79.5–101.0)
MONO#: 1.1 10*3/uL — ABNORMAL HIGH (ref 0.1–0.9)
MONO%: 8.8 % (ref 0.0–14.0)
NEUT#: 10.1 10*3/uL — ABNORMAL HIGH (ref 1.5–6.5)
NEUT%: 78.4 % — AB (ref 38.4–76.8)
Platelets: 369 10*3/uL (ref 145–400)
RBC: 3.07 10*6/uL — AB (ref 3.70–5.45)
RDW: 13.5 % (ref 11.2–14.5)
WBC: 12.9 10*3/uL — ABNORMAL HIGH (ref 3.9–10.3)
nRBC: 0 % (ref 0–0)

## 2016-09-07 LAB — COMPREHENSIVE METABOLIC PANEL
ALBUMIN: 3.4 g/dL — AB (ref 3.5–5.0)
ALT: 17 U/L (ref 0–55)
AST: 16 U/L (ref 5–34)
Alkaline Phosphatase: 72 U/L (ref 40–150)
Anion Gap: 9 mEq/L (ref 3–11)
BUN: 17.7 mg/dL (ref 7.0–26.0)
CHLORIDE: 100 meq/L (ref 98–109)
CO2: 26 meq/L (ref 22–29)
Calcium: 9.3 mg/dL (ref 8.4–10.4)
Creatinine: 0.7 mg/dL (ref 0.6–1.1)
EGFR: 76 mL/min/{1.73_m2} — AB (ref >90)
GLUCOSE: 89 mg/dL (ref 70–140)
POTASSIUM: 4.8 meq/L (ref 3.5–5.1)
SODIUM: 135 meq/L — AB (ref 136–145)
Total Bilirubin: 2.27 mg/dL — ABNORMAL HIGH (ref 0.20–1.20)
Total Protein: 7 g/dL (ref 6.4–8.3)

## 2016-09-07 MED ORDER — INFLUENZA VAC SPLIT QUAD 0.5 ML IM SUSY
0.5000 mL | PREFILLED_SYRINGE | Freq: Once | INTRAMUSCULAR | Status: AC
  Administered 2016-09-07: 0.5 mL via INTRAMUSCULAR
  Filled 2016-09-07: qty 0.5

## 2016-09-07 NOTE — Progress Notes (Signed)
  [facility] OFFICE PROGRESS NOTE   Diagnosis: Cold agglutinin disease  INTERVAL HISTORY:   Ms. Usman returns as scheduled. She reports a good appetite. Her energy level has improved, but is not back to normal. No fever. She has discomfort at the right costal margin with a deep inspiration.  Objective:  Vital signs in last 24 hours:  Blood pressure (!) 149/57, pulse 82, temperature 98.9 F (37.2 C), temperature source Oral, resp. rate 18, height '5\' 3"'$  (1.6 m), weight 112 lb 11.2 oz (51.1 kg), SpO2 99 %.    HEENT: Sclera anicteric Resp: Scattered bronchial sounds, no respiratory distress, good air movement bilaterally Cardio: Regular rate and rhythm GI: No hepatosplenomegaly, tender in near the right costal margin, no mass Vascular: No leg edema   Lab Results:  Lab Results  Component Value Date   WBC 12.9 (H) 09/07/2016   HGB 10.2 (L) 09/07/2016   HCT 29.4 (L) 09/07/2016   MCV 95.8 09/07/2016   PLT 369 09/07/2016   NEUTROABS 10.1 (H) 09/07/2016     Medications: I have reviewed the patient's current medications.  Assessment/Plan: 1. Admission with fever and liver abscesses 06/19/2016  Status post placement of a liver drain on 06/21/2010, New drain placed 06/30/2016  Treated with broad-spectrum intravenous antibiotics, completing outpatient course of Augmentin   CT 07/20/2016-no residual abscess identified,  2. High titer cold agglutinin, serum monoclonal IgM kappa protein  Bone marrow biopsy 07/02/2016-07/05/2016 revealed reactive myeloid changes, few interstitial lymphoid aggregateswith CD20/CD5 expression and a small clonal B-cell kappa restricted population on flow cytometry, increased kappa restricted interstitial plasma cells, probable early involvement with a low-grade lymphoproliferative disorder  3. 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 Hyperbilirubinemia secondary to hemolysis and liver  abscesses-improved    Disposition:  Ms. Harkins appears stable. The bilirubin remains elevated, likely secondary to ongoing hemolysis. The hemoglobin is stable. She will return for an office visit and CBC in approximately 5 weeks. She will contact us in the interim for new symptoms. The plan is to observe the cold agglutinin disease unless she develops progressive anemia.  Betsy Coder, MD  09/07/2016  11:29 AM

## 2016-09-07 NOTE — Telephone Encounter (Signed)
Gv pt appt for 11/2 @ 2.45pm.

## 2016-10-13 ENCOUNTER — Encounter: Payer: Self-pay | Admitting: Internal Medicine

## 2016-10-13 ENCOUNTER — Ambulatory Visit (INDEPENDENT_AMBULATORY_CARE_PROVIDER_SITE_OTHER): Payer: Medicare Other | Admitting: Internal Medicine

## 2016-10-13 VITALS — BP 128/72 | HR 100 | Ht 63.0 in | Wt 111.2 lb

## 2016-10-13 DIAGNOSIS — R1084 Generalized abdominal pain: Secondary | ICD-10-CM

## 2016-10-13 DIAGNOSIS — K750 Abscess of liver: Secondary | ICD-10-CM | POA: Diagnosis not present

## 2016-10-13 DIAGNOSIS — R141 Gas pain: Secondary | ICD-10-CM | POA: Diagnosis not present

## 2016-10-13 DIAGNOSIS — K5732 Diverticulitis of large intestine without perforation or abscess without bleeding: Secondary | ICD-10-CM

## 2016-10-13 MED ORDER — HYOSCYAMINE SULFATE 0.125 MG SL SUBL: 0.1250 mg | SUBLINGUAL_TABLET | SUBLINGUAL | 0 refills | Status: DC | PRN

## 2016-10-13 NOTE — Patient Instructions (Signed)
Your physician has requested that you go to the basement for the following lab work before leaving today:  BMET  We have sent the following medications to your pharmacy for you to pick up at your convenience:  Levsin  You have been scheduled for a CT scan of the abdomen and pelvis at [facility] ([address]---this is in the same building as [facility]).   You are scheduled on 10-20-2016 at 3:30pm. You should arrive 15 minutes prior to your appointment time for registration. Please follow the written instructions below on the day of your exam:  WARNING: IF YOU ARE ALLERGIC TO IODINE/X-RAY DYE, PLEASE NOTIFY RADIOLOGY IMMEDIATELY AT [phone]! YOU WILL BE GIVEN A 13 HOUR PREMEDICATION PREP.  1) Do not eat or drink anything after 11:30am (4 hours prior to your test) 2) You have been given 2 bottles of oral contrast to drink. The solution may taste               better if refrigerated, but do NOT add ice or any other liquid to this solution. Shake             well before drinking.    Drink 1 bottle of contrast @ 1:30pm (2 hours prior to your exam)  Drink 1 bottle of contrast @ 2:30pm (1 hour prior to your exam)  You may take any medications as prescribed with a small amount of water except for the following: Metformin, Glucophage, Glucovance, Avandamet, Riomet, Fortamet, Actoplus Met, Janumet, Glumetza or Metaglip. The above medications must be held the day of the exam AND 48 hours after the exam.  The purpose of you drinking the oral contrast is to aid in the visualization of your intestinal tract. The contrast solution may cause some diarrhea. Before your exam is started, you will be given a small amount of fluid to drink. Depending on your individual set of symptoms, you may also receive an intravenous injection of x-ray contrast/dye. Plan on being at [facility] for 30 minutes or long, depending on the type of exam you are having performed.  If you have any  questions regarding your exam or if you need to reschedule, you may call the CT department at [phone] between the hours of 8:00 am and 5:00 pm, Monday-Friday.  ________________________________________________________________________

## 2016-10-13 NOTE — Progress Notes (Signed)
HISTORY OF PRESENT ILLNESS:  Brianna [name] is a delightful 82 y.o. female from [place] who is referred by Dr. Benita Gutter PA regarding abdominal complaints and possible diverticulitis. I saw the patient on 1 occasion 06/20/2016 when she was admitted with symptomatic hepatic abscess. This was treated under the guidance of infectious diseases and interventional radiology. Eventually had her percutaneous tube removed around August. Recently seen in Dr. Ammie Ferrier office complaining of lower abdominal discomfort. Diagnosed with diverticulitis presumptively and placed on antibiotics (Augmentin). Office note reviewed from 09/27/2016. This appointment recommended. Patient is covered by her daughter. She tells me that she has increased intestinal gas with associated cramping. Relieved with passing flatus. Cramping is severe at times. She has had her diet changed after being diagnosed with diverticulitis. Does not like this. Also reports problems with her nerves and significant stress. Bowel movements fluctuate, though she is not constipated as she had been previously. Feels fine presently. Last CT several months ago. Recent blood work reviewed and unremarkable  REVIEW OF SYSTEMS:  All non-GI ROS negative except for anxiety  Past Medical History:  Diagnosis Date  . Bladder infection   . Hypertension   . Pneumonia   . Thoracic vertebral fracture (Brianna [name])    T 11, unsure type of fracture    Past Surgical History:  Procedure Laterality Date  . APPENDECTOMY    . FRACTURE SURGERY      Social History Brianna [name]  reports that she has never smoked. She has never used smokeless tobacco. She reports that she drinks alcohol. She reports that she does not use drugs.  family history includes Cancer in her sister; Heart disease in her father and mother; Hypertension in her sister; Stroke in her sister.  Allergies  Allergen Reactions  . Other Nausea And Vomiting    *All pain medications*  . Compazine  [Prochlorperazine] Other (See Comments)    Restless, hallucinations  . Erythromycin     "passed out"  . Lyrica [Pregabalin] Other (See Comments)    Sleep walking  . Promethazine Other (See Comments)    Restless, hallucinations  . Tramadol Nausea And Vomiting  . Vistaril [Hydroxyzine Hcl] Other (See Comments)    Stated she had hallucinations.       PHYSICAL EXAMINATION: Vital signs: BP 128/72 (BP Location: Left Arm, Patient Position: Sitting, Cuff Size: Normal)   Pulse 100   Ht 5\' 3"  (1.6 m)   Wt 111 lb 3.2 oz (50.4 kg)   BMI 19.70 kg/m   Constitutional: generally well-appearing, no acute distress Psychiatric: alert and oriented x3, cooperative Eyes: extraocular movements intact, anicteric, conjunctiva pink Mouth: oral pharynx moist, no lesions Neck: supple no lymphadenopathy Cardiovascular: heart regular rate and rhythm, no murmur Lungs: clear to auscultation bilaterally Abdomen: soft, Mild tenderness right midabdomen without mass, nondistended, no obvious ascites, no peritoneal signs, normal bowel sounds, no organomegaly Rectal:Omitted Extremities: no clubbing cyanosis or lower extremity edema bilaterally Skin: no lesions on visible extremities Neuro: No focal deficits. Cranial nerves intact  ASSESSMENT:  #1. Nonspecific abdominal complaints as manifested by gas with associated cramping #2. Recent history of hepatic abscess  #3. No diverticular disease question recent diverticulitis  PLAN:  #1. Resume previous diet #2. Increase fiber #3. Prescribed Levsin sublingual 0.125 mg. 1-2 sublingual as needed for cramping pain #4. Schedule contrast-enhanced CT scan of the abdomen and pelvis to rule out any intra-abdominal process to explain recent problems with pain. Specifically, rule out active diverticular disease, complications from diverticular disease, or recurrent hepatic  issues

## 2016-10-14 ENCOUNTER — Telehealth: Payer: Self-pay | Admitting: Nurse Practitioner

## 2016-10-14 ENCOUNTER — Other Ambulatory Visit (HOSPITAL_BASED_OUTPATIENT_CLINIC_OR_DEPARTMENT_OTHER): Payer: Medicare Other

## 2016-10-14 ENCOUNTER — Ambulatory Visit (HOSPITAL_BASED_OUTPATIENT_CLINIC_OR_DEPARTMENT_OTHER): Payer: Medicare Other | Admitting: Nurse Practitioner

## 2016-10-14 VITALS — BP 121/53 | HR 85 | Temp 98.7°F | Resp 18 | Ht 63.0 in | Wt 111.0 lb

## 2016-10-14 DIAGNOSIS — D591 Other autoimmune hemolytic anemias: Secondary | ICD-10-CM | POA: Diagnosis present

## 2016-10-14 DIAGNOSIS — D5912 Cold autoimmune hemolytic anemia: Secondary | ICD-10-CM

## 2016-10-14 DIAGNOSIS — E638 Other specified nutritional deficiencies: Secondary | ICD-10-CM | POA: Diagnosis not present

## 2016-10-14 DIAGNOSIS — D5919 Other autoimmune hemolytic anemia: Secondary | ICD-10-CM

## 2016-10-14 DIAGNOSIS — R109 Unspecified abdominal pain: Secondary | ICD-10-CM

## 2016-10-14 DIAGNOSIS — Z23 Encounter for immunization: Secondary | ICD-10-CM

## 2016-10-14 LAB — CBC WITH DIFFERENTIAL/PLATELET
BASO%: 0.6 % (ref 0.0–2.0)
Basophils Absolute: 0.1 10*3/uL (ref 0.0–0.1)
EOS ABS: 0.1 10*3/uL (ref 0.0–0.5)
EOS%: 1 % (ref 0.0–7.0)
HEMATOCRIT: 26.1 % — AB (ref 34.8–46.6)
HEMOGLOBIN: 8.9 g/dL — AB (ref 11.6–15.9)
LYMPH#: 3 10*3/uL (ref 0.9–3.3)
LYMPH%: 22.4 % (ref 14.0–49.7)
MCH: 32.2 pg (ref 25.1–34.0)
MCHC: 34.1 g/dL (ref 31.5–36.0)
MCV: 94.6 fL (ref 79.5–101.0)
MONO#: 1.2 10*3/uL — AB (ref 0.1–0.9)
MONO%: 8.9 % (ref 0.0–14.0)
NEUT%: 67.1 % (ref 38.4–76.8)
NEUTROS ABS: 9.1 10*3/uL — AB (ref 1.5–6.5)
Platelets: 372 10*3/uL (ref 145–400)
RBC: 2.76 10*6/uL — ABNORMAL LOW (ref 3.70–5.45)
RDW: 13.6 % (ref 11.2–14.5)
WBC: 13.5 10*3/uL — ABNORMAL HIGH (ref 3.9–10.3)
nRBC: 0 % (ref 0–0)

## 2016-10-14 LAB — COMPREHENSIVE METABOLIC PANEL
ALBUMIN: 3.3 g/dL — AB (ref 3.5–5.0)
ALK PHOS: 72 U/L (ref 40–150)
ALT: 11 U/L (ref 0–55)
AST: 14 U/L (ref 5–34)
Anion Gap: 6 mEq/L (ref 3–11)
BILIRUBIN TOTAL: 2.52 mg/dL — AB (ref 0.20–1.20)
BUN: 17.1 mg/dL (ref 7.0–26.0)
CALCIUM: 9.2 mg/dL (ref 8.4–10.4)
CO2: 28 mEq/L (ref 22–29)
CREATININE: 0.7 mg/dL (ref 0.6–1.1)
Chloride: 101 mEq/L (ref 98–109)
EGFR: 77 mL/min/{1.73_m2} — ABNORMAL LOW (ref >90)
Glucose: 112 mg/dl (ref 70–140)
Potassium: 4.2 mEq/L (ref 3.5–5.1)
Sodium: 135 mEq/L — ABNORMAL LOW (ref 136–145)
Total Protein: 6.8 g/dL (ref 6.4–8.3)

## 2016-10-14 NOTE — Progress Notes (Addendum)
  [facility] OFFICE PROGRESS NOTE   Diagnosis:  Cold agglutinin disease  INTERVAL HISTORY:   Ms. Eden returns as scheduled. She notes increased fatigue. She has recently noticed increased pain at the right upper abdomen.The pain typically occurs when she coughs, sneezes or takes a deep breath. No fever. She intermittently feels chilled. No shaking chills. Mild increase in baseline dyspnea on exertion.  She is scheduled for CT scans of the abdomen/pelvis 10/20/2016 per Dr. Henrene Pastor.  Objective:  Vital signs in last 24 hours:  Blood pressure (!) 121/53, pulse 85, temperature 98.7 F (37.1 C), temperature source Oral, resp. rate 18, height '5\' 3"'$  (1.6 m), weight 111 lb (50.3 kg), SpO2 100 %.    HEENT: No thrush or ulcers. Mild scleral icterus. Lymphatics: No palpable cervical, supraclavicular or axillary lymph nodes. Resp: Lungs clear bilaterally. Cardio: Regular rate and rhythm. GI: Abdomen is soft. Tender at the right upper abdomen. No organomegaly. No mass. Vascular: No leg edema.   Lab Results:  Lab Results  Component Value Date   WBC 13.5 (H) 10/14/2016   HGB 8.9 (L) 10/14/2016   HCT 26.1 (L) 10/14/2016   MCV 94.6 10/14/2016   PLT 372 10/14/2016   NEUTROABS 9.1 (H) 10/14/2016    Imaging:  No results found.  Medications: I have reviewed the patient's current medications.  Assessment/Plan: 1. Admission with fever and liver abscesses 06/19/2016  Status post placement of a liver drain on 06/21/2010, New drain placed 06/30/2016  Treated with broad-spectrum intravenous antibiotics, completed outpatient course of Augmentin   CT 07/20/2016-no residual abscess identified  2. 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 Hyperbilirubinemia secondary to hemolysis and liver abscesses  Disposition: Ms. Formisano has progressive anemia and the bilirubin is higher. This is likely due to hemolysis. She is having increased pain at the right upper abdomen and is scheduled for CT scans 10/20/2016. We will see her back on 10/21/2016 with repeat labs. She understands to contact the office prior to that visit with signs/symptoms suggestive of progressive anemia. We also discussed that she should contact the office with fever, shaking chills or other signs of infection.  Patient seen with Dr. Benay Spice.    Ned Card ANP/GNP-BC   10/14/2016  3:20 PM  This was a shared visit with Ned Card. She has progressive anemia, likely secondary to cold agglutinin related hemolysis. She may have recurrent infection in the liver. She will contact us for symptoms of anemia and we will arrange for transfusion support. Ms. Bleich will return for an office and lab visit in one week.  Julieanne Manson, M.D.

## 2016-10-14 NOTE — Telephone Encounter (Signed)
Appointments scheduled per 11/2 LOS. The patient was given AVS report and calendars with future scheduled appointments. °

## 2016-10-20 ENCOUNTER — Ambulatory Visit (INDEPENDENT_AMBULATORY_CARE_PROVIDER_SITE_OTHER)
Admission: RE | Admit: 2016-10-20 | Discharge: 2016-10-20 | Disposition: A | Discharge status: Home / Self Care | Payer: Medicare Other | Source: Ambulatory Visit | Attending: Internal Medicine | Admitting: Internal Medicine

## 2016-10-20 DIAGNOSIS — R1084 Generalized abdominal pain: Secondary | ICD-10-CM | POA: Diagnosis not present

## 2016-10-20 DIAGNOSIS — R141 Gas pain: Secondary | ICD-10-CM

## 2016-10-20 MED ORDER — IOPAMIDOL (ISOVUE-300) INJECTION 61%
100.0000 mL | Freq: Once | INTRAVENOUS | Status: AC | PRN
  Administered 2016-10-20: 100 mL via INTRAVENOUS

## 2016-10-21 ENCOUNTER — Ambulatory Visit (HOSPITAL_BASED_OUTPATIENT_CLINIC_OR_DEPARTMENT_OTHER): Payer: Medicare Other | Admitting: Nurse Practitioner

## 2016-10-21 ENCOUNTER — Telehealth: Payer: Self-pay | Admitting: Oncology

## 2016-10-21 ENCOUNTER — Other Ambulatory Visit (HOSPITAL_BASED_OUTPATIENT_CLINIC_OR_DEPARTMENT_OTHER): Payer: Medicare Other

## 2016-10-21 VITALS — BP 144/68 | HR 110 | Temp 98.6°F | Resp 17 | Ht 63.0 in | Wt 109.8 lb

## 2016-10-21 DIAGNOSIS — D591 Other autoimmune hemolytic anemias: Secondary | ICD-10-CM | POA: Diagnosis present

## 2016-10-21 DIAGNOSIS — K6389 Other specified diseases of intestine: Secondary | ICD-10-CM

## 2016-10-21 DIAGNOSIS — K639 Disease of intestine, unspecified: Secondary | ICD-10-CM | POA: Diagnosis not present

## 2016-10-21 DIAGNOSIS — D5912 Cold autoimmune hemolytic anemia: Secondary | ICD-10-CM

## 2016-10-21 LAB — CBC WITH DIFFERENTIAL/PLATELET
BASO%: 0.5 % (ref 0.0–2.0)
BASOS ABS: 0.1 10*3/uL (ref 0.0–0.1)
EOS%: 0.5 % (ref 0.0–7.0)
Eosinophils Absolute: 0.1 10*3/uL (ref 0.0–0.5)
HCT: 25.7 % — ABNORMAL LOW (ref 34.8–46.6)
HEMOGLOBIN: 8.8 g/dL — AB (ref 11.6–15.9)
LYMPH#: 2.1 10*3/uL (ref 0.9–3.3)
LYMPH%: 14.7 % (ref 14.0–49.7)
MCH: 32.1 pg (ref 25.1–34.0)
MCHC: 34.2 g/dL (ref 31.5–36.0)
MCV: 93.8 fL (ref 79.5–101.0)
MONO#: 1.2 10*3/uL — ABNORMAL HIGH (ref 0.1–0.9)
MONO%: 8 % (ref 0.0–14.0)
NEUT#: 11.1 10*3/uL — ABNORMAL HIGH (ref 1.5–6.5)
NEUT%: 76.3 % (ref 38.4–76.8)
Platelets: 362 10*3/uL (ref 145–400)
RBC: 2.74 10*6/uL — ABNORMAL LOW (ref 3.70–5.45)
RDW: 13.6 % (ref 11.2–14.5)
WBC: 14.6 10*3/uL — ABNORMAL HIGH (ref 3.9–10.3)
nRBC: 0 % (ref 0–0)

## 2016-10-21 LAB — COMPREHENSIVE METABOLIC PANEL
ALBUMIN: 3.3 g/dL — AB (ref 3.5–5.0)
ALK PHOS: 74 U/L (ref 40–150)
ALT: 8 U/L (ref 0–55)
AST: 13 U/L (ref 5–34)
Anion Gap: 7 mEq/L (ref 3–11)
BUN: 13.6 mg/dL (ref 7.0–26.0)
CALCIUM: 9.3 mg/dL (ref 8.4–10.4)
CO2: 28 mEq/L (ref 22–29)
Chloride: 96 mEq/L — ABNORMAL LOW (ref 98–109)
Creatinine: 0.7 mg/dL (ref 0.6–1.1)
EGFR: 77 mL/min/{1.73_m2} — AB (ref >90)
GLUCOSE: 118 mg/dL (ref 70–140)
POTASSIUM: 4 meq/L (ref 3.5–5.1)
SODIUM: 131 meq/L — AB (ref 136–145)
Total Bilirubin: 2.43 mg/dL — ABNORMAL HIGH (ref 0.20–1.20)
Total Protein: 6.9 g/dL (ref 6.4–8.3)

## 2016-10-21 NOTE — Telephone Encounter (Signed)
Appointments scheduled per 11/9 LOS. Patient given AVS report and calendars with future scheduled appointments. °

## 2016-10-21 NOTE — Progress Notes (Addendum)
Brianna [name] OFFICE PROGRESS NOTE   Diagnosis:  Cold agglutinin disease  INTERVAL HISTORY:   Brianna [name] returns as scheduled. She reports crampy abdominal pain. She developed diarrhea after the CT contrast. No fevers or sweats. No rectal bleeding. She intermittently notes a portion of the stool is "yellow".  Objective:  Vital signs in last 24 hours:  Blood pressure (!) 144/68, pulse (!) 110, temperature 98.6 F (37 C), temperature source Oral, resp. rate 17, height 5' 3"  (1.6 m), weight 109 lb 12.8 oz (49.8 kg), SpO2 99 %.    HEENT: No thrush or ulcers. Lymphatics: No palpable cervical, supra-clavicular or inguinal lymph nodes. Resp: Lungs clear bilaterally. Cardio: Regular rate and rhythm. GI: Abdomen is soft. No hepatomegaly. Tender right mid to upper abdomen. Vascular: No leg edema.   Lab Results:  Lab Results  Component Value Date   WBC 14.6 (H) 10/21/2016   HGB 8.8 (L) 10/21/2016   HCT 25.7 (L) 10/21/2016   MCV 93.8 10/21/2016   PLT 362 10/21/2016   NEUTROABS 11.1 (H) 10/21/2016    Imaging:  Ct Abdomen Pelvis W Contrast  Result Date: 10/20/2016 CLINICAL DATA:  Persistent abdominal pain. History of multiple liver abscesses with drains in July in August. EXAM: CT ABDOMEN AND PELVIS WITH CONTRAST TECHNIQUE: Multidetector CT imaging of the abdomen and pelvis was performed using the standard protocol following bolus administration of intravenous contrast. CONTRAST:  163m ISOVUE-300 IOPAMIDOL (ISOVUE-300) INJECTION 61% COMPARISON:  Multiple priors, including 07/20/2016. FINDINGS: Lower chest: Likely post infectious or inflammatory volume loss in the right middle lobe and less so lingula with mild bronchiectasis in the right middle lobe. Mild cardiomegaly, without pericardial or pleural effusion. Tiny hiatal hernia. Hepatobiliary: Improved appearance the liver, with only subtle hypoattenuation on image 16/series 2 in the right lobe, at the site of  percutaneous drain previously. No new liver lesions. Normal gallbladder, without biliary ductal dilatation. Pancreas: Small low-density pancreatic lesions are of doubtful clinical significance given patient age and comorbidities. Example similar at 8 mm within the pancreatic body on image 23/series 2. No evidence of acute pancreatitis. Spleen: Normal in size, without focal abnormality. Adrenals/Urinary Tract: Normal adrenal glands. Too small to characterize lesions in both kidneys. Interpolar left renal lesion measures greater than fluid density, including at 1.2 cm on image 13/series 5. No hydronephrosis. Normal urinary bladder. Stomach/Bowel: Normal remainder of the stomach. Apple core type lesion involves the mid transverse colon, including on image 43/series 2. This continues for an approximately 5.6 cm distance. No obstruction. Probable transmural disease extension, especially anteriorly on image 44/series 2. Normal terminal ileum. Normal small bowel. Vascular/Lymphatic: Aortic and branch vessel atherosclerosis. No retroperitoneal or retrocrural adenopathy. Suspect enlarged nodes at the root of the transverse mesocolon. Example at 9 mm on image 33/series 2. No pelvic sidewall adenopathy. Reproductive: Normal uterus and adnexa. Other: No significant free fluid. Pelvic floor laxity. No evidence of omental or peritoneal disease. Musculoskeletal: Osteopenia. Compression deformities which are mild at L3, moderate L1, and severe T11, similar. IMPRESSION: 1. 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 Apple core type lesion with transmural extension. 2. Suspicion of nodal metastasis at the root of the transverse mesocolon. These results will be called to the ordering clinician or representative by the Radiologist Assistant, and communication documented in the PACS or zVision Dashboard. 3.  Aortic atherosclerosis. 4. Indeterminate left renal lesion. Given well-circumscribed appearance, most  likely a complex cyst. This could be re-evaluated at follow-up and is of questionable clinical significance  given patient age and comorbidities. Electronically Signed   By: Abigail Miyamoto M.D.   On: 10/20/2016 17:05    Medications: I have reviewed the patient's current medications.  Assessment/Plan: 1. Admission with fever and liver abscesses 06/19/2016  Status post placement of a liver drain on 06/21/2010, New drain placed 06/30/2016  Treated with broad-spectrum intravenous antibiotics, completed outpatient course of Augmentin   CT 07/20/2016-no residual abscess identified  CT 10/20/2016-improved appearance of the liver; no new liver lesions; apple core type lesion involving the mid transverse colon continuing for approximately 5.6 cm. No obstruction. Probable transmural disease extension especially anteriorly. Suspected enlarged nodes at the root of the transverse mesocolon; no pelvic sidewall adenopathy.  2. 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 Hyperbilirubinemia secondary to hemolysis and liver abscesses  5. CT abdomen/pelvis 10/20/2016-improved appearance of the liver; no new liver lesions; apple core type lesion involving the mid transverse colon continuing for approximately 5.6 cm. No obstruction. Probable transmural disease extension especially anteriorly. Suspected enlarged nodes at the root of the transverse mesocolon; no pelvic sidewall adenopathy.   Disposition: [name] appears unchanged. Hemoglobin and bilirubin remained stable. She and her  daughter understand the anemia is most likely due to hemolysis.  The CT scan done yesterday showed further improvement in the appearance of the liver and there were no new liver lesions. A lesion was seen involving the mid transverse colon. She is scheduled for a colonoscopy by Dr. Henrene Pastor on 11/02/2016. Dr. Benay Spice reviewed the CT report/images with Brianna [name] and her daughter at today's visit. They understand the colon lesion may represent colon cancer.  We scheduled a return visit here with labs on 11/05/2016. She will contact the office in the interim with any problems.  Patient seen with Dr. Benay Spice. 25 minutes were spent face-to-face at today's visit with the majority of that time involved in counseling/coordination of care.    Ned Card ANP/GNP-BC   10/21/2016  4:26 PM This was a shared visit with Ned Card. I discussed the CT findings and reviewed the images with Brianna [name] and her daughter. I doubt the hemolytic anemia is related to the apparent diagnosis of colon cancer.  She is scheduled to undergo a colonoscopy next week. She most likely has colon cancer, but it is possible the colon finding is related to lymphoma.  I discussed treatment of colon cancer with Brianna [name] and her daughter. We will make a surgical referral.  She will return for an office visit after the colonoscopy. She may require a red cell transfusion prior to surgery.  Julieanne Manson, M.D.

## 2016-10-27 ENCOUNTER — Ambulatory Visit (AMBULATORY_SURGERY_CENTER): Payer: Self-pay | Admitting: *Deleted

## 2016-10-27 ENCOUNTER — Telehealth: Payer: Self-pay | Admitting: *Deleted

## 2016-10-27 VITALS — Ht 63.0 in | Wt 111.0 lb

## 2016-10-27 DIAGNOSIS — K639 Disease of intestine, unspecified: Secondary | ICD-10-CM

## 2016-10-27 DIAGNOSIS — K599 Functional intestinal disorder, unspecified: Secondary | ICD-10-CM

## 2016-10-27 NOTE — Progress Notes (Signed)
See T.E. To Dr. Henrene Pastor.  I will call daughter Rodena Piety as soon as Dr. Henrene Pastor gets back to me.

## 2016-10-27 NOTE — Telephone Encounter (Signed)
Dr. Henrene Pastor,  This pt is here for her PV today with her daughter.  Her daughter is very insistent that she would prefer pt be done at [facility].  The reason is that she has sodium problems and wants to make sure pt is safe because she lives alone.  She has passed out three times in the past d/t low NA, one resulting in a back fracture.  The pt does not want to use Suprep.    She has an autoimmune disorder, resulting in her having nine blood transfusions this past summer.  The daughter would like Ms. Shake to be admitted the night before if possible to make sure she is carefully monitored during her prep for the procedure.  I spoke with Magda Paganini who told me your next hospital week is the week after next.    They have an hour appointment the day after Thanksgiving with Dr. Benay Spice to discuss the next step in her tx.  They are wondering if the hospital doctor for next week could do her colonoscopy.  Could this please be arranged?  The daughter is very uncomfortable with her mother having the procedure at [facility] and prepping at her own home the evening before given her hx.

## 2016-10-27 NOTE — Progress Notes (Signed)
Dr. Henrene Pastor,  This pt is here for her PV today with her daughter.  Her daughter is very insistent that she would prefer pt be done at [facility].  The reason is that she has sodium problems and wants to make sure pt is safe because she lives alone.  She has passed out three times in the past d/t low NA, one resulting in a back fracture.  The pt does not want to use Suprep.    She has an autoimmune disorder, resulting in her having nine blood transfusions this past summer.  The daughter would like Ms. Lapidus to be admitted the night before if possible to make sure she is carefully monitored during her prep for the procedure.  I spoke with Magda Paganini who told me your next hospital week is the week after next.    They have an hour appointment the day after Thanksgiving with Dr. Benay Spice to discuss the next step in her tx.  They are wondering if the hospital doctor for next week could do her colonoscopy.  Could this please be arranged?  The daughter is very uncomfortable with her mother having the procedure at [facility] and prepping at her own home the evening before given her hx.

## 2016-10-28 ENCOUNTER — Telehealth: Payer: Self-pay | Admitting: *Deleted

## 2016-10-28 DIAGNOSIS — D5912 Cold autoimmune hemolytic anemia: Secondary | ICD-10-CM

## 2016-10-28 DIAGNOSIS — D591 Other autoimmune hemolytic anemias: Principal | ICD-10-CM

## 2016-10-28 MED ORDER — HYOSCYAMINE SULFATE 0.125 MG SL SUBL: 0.1250 mg | SUBLINGUAL_TABLET | SUBLINGUAL | 0 refills | Status: DC | PRN

## 2016-10-28 NOTE — Telephone Encounter (Signed)
Yes.  Thank you.

## 2016-10-28 NOTE — Telephone Encounter (Signed)
Thank you, Dr. Henrene Pastor.  I will call her daughter to let her know.  One more thing, before they left their PV, they asked for a refill of Levsin.  It is working very well for her.  Is ok to send that in for her?  Thank you, Cyril Mourning

## 2016-10-28 NOTE — Telephone Encounter (Signed)
Arranged early admin with bed placement for 11/10/16, pt to be admitted for prep. Colon scheduled at [facility] 11/11/16@9 :30am. Left message for pts daughter to call back regarding appt and arrangements.

## 2016-10-28 NOTE — Telephone Encounter (Signed)
Spoke with pt's daughter, colonoscopy is being rescheduled to week of 11/27 and will be done in the hospital. Pt is scheduled to see Dr. Harlow Asa on 11/16/16. Dr. Benay Spice made aware: Reschedule office visit to 11/17/16 @ 11. Message to schedulers to call daughter with appt.

## 2016-10-28 NOTE — Telephone Encounter (Signed)
I am in the hospital the last week of November. I will forward this to our had physician assistant Amy to arrange for patient to be admitted for observation with colonoscopy prep and next day colonoscopy with MAC/propofol. Thank you. Dr. Henrene Pastor. Amy, please assist with above. She may have a transverse colon cancer. I will forward to [place] as well

## 2016-10-28 NOTE — Telephone Encounter (Signed)
Pts daughter aware of admission date and colonoscopy date.

## 2016-10-28 NOTE — Telephone Encounter (Signed)
Refill sent to pt's pharmacy and daughter Rodena Piety made aware of this.

## 2016-11-02 ENCOUNTER — Encounter: Payer: Medicare Other | Admitting: Internal Medicine

## 2016-11-05 ENCOUNTER — Ambulatory Visit: Payer: Medicare Other | Admitting: Oncology

## 2016-11-05 ENCOUNTER — Other Ambulatory Visit: Payer: Medicare Other

## 2016-11-09 ENCOUNTER — Telehealth: Payer: Self-pay

## 2016-11-09 NOTE — Telephone Encounter (Signed)
All questions answered for the patient.  She verbalized understanding to be on a clear liquid diet in the am for planned admission

## 2016-11-10 ENCOUNTER — Observation Stay (HOSPITAL_COMMUNITY)
Admission: AD | Admit: 2016-11-10 | Discharge: 2016-11-11 | Disposition: A | Discharge status: Home / Self Care | Payer: Medicare Other | Source: Ambulatory Visit | Attending: Internal Medicine | Admitting: Internal Medicine

## 2016-11-10 ENCOUNTER — Encounter (HOSPITAL_COMMUNITY): Payer: Self-pay

## 2016-11-10 DIAGNOSIS — F419 Anxiety disorder, unspecified: Secondary | ICD-10-CM | POA: Diagnosis not present

## 2016-11-10 DIAGNOSIS — M1990 Unspecified osteoarthritis, unspecified site: Secondary | ICD-10-CM | POA: Diagnosis not present

## 2016-11-10 DIAGNOSIS — Z800 Family history of malignant neoplasm of digestive organs: Secondary | ICD-10-CM | POA: Insufficient documentation

## 2016-11-10 DIAGNOSIS — K648 Other hemorrhoids: Secondary | ICD-10-CM | POA: Diagnosis not present

## 2016-11-10 DIAGNOSIS — E785 Hyperlipidemia, unspecified: Secondary | ICD-10-CM | POA: Insufficient documentation

## 2016-11-10 DIAGNOSIS — Z881 Allergy status to other antibiotic agents status: Secondary | ICD-10-CM | POA: Insufficient documentation

## 2016-11-10 DIAGNOSIS — M810 Age-related osteoporosis without current pathological fracture: Secondary | ICD-10-CM | POA: Insufficient documentation

## 2016-11-10 DIAGNOSIS — K6389 Other specified diseases of intestine: Secondary | ICD-10-CM | POA: Diagnosis present

## 2016-11-10 DIAGNOSIS — Z888 Allergy status to other drugs, medicaments and biological substances status: Secondary | ICD-10-CM | POA: Diagnosis not present

## 2016-11-10 DIAGNOSIS — R933 Abnormal findings on diagnostic imaging of other parts of digestive tract: Secondary | ICD-10-CM | POA: Diagnosis present

## 2016-11-10 DIAGNOSIS — D649 Anemia, unspecified: Secondary | ICD-10-CM | POA: Diagnosis not present

## 2016-11-10 DIAGNOSIS — D72829 Elevated white blood cell count, unspecified: Secondary | ICD-10-CM | POA: Insufficient documentation

## 2016-11-10 DIAGNOSIS — I10 Essential (primary) hypertension: Secondary | ICD-10-CM | POA: Insufficient documentation

## 2016-11-10 DIAGNOSIS — Z885 Allergy status to narcotic agent status: Secondary | ICD-10-CM | POA: Insufficient documentation

## 2016-11-10 DIAGNOSIS — J45909 Unspecified asthma, uncomplicated: Secondary | ICD-10-CM | POA: Insufficient documentation

## 2016-11-10 DIAGNOSIS — D591 Other autoimmune hemolytic anemias: Secondary | ICD-10-CM | POA: Insufficient documentation

## 2016-11-10 DIAGNOSIS — C184 Malignant neoplasm of transverse colon: Principal | ICD-10-CM | POA: Insufficient documentation

## 2016-11-10 DIAGNOSIS — E871 Hypo-osmolality and hyponatremia: Secondary | ICD-10-CM | POA: Diagnosis not present

## 2016-11-10 LAB — BASIC METABOLIC PANEL
ANION GAP: 8 (ref 5–15)
BUN: 17 mg/dL (ref 6–20)
CHLORIDE: 96 mmol/L — AB (ref 101–111)
CO2: 26 mmol/L (ref 22–32)
Calcium: 8.6 mg/dL — ABNORMAL LOW (ref 8.9–10.3)
Creatinine, Ser: 0.43 mg/dL — ABNORMAL LOW (ref 0.44–1.00)
GFR calc Af Amer: >60 mL/min (ref >60)
GLUCOSE: 99 mg/dL (ref 65–99)
POTASSIUM: 4 mmol/L (ref 3.5–5.1)
Sodium: 130 mmol/L — ABNORMAL LOW (ref 135–145)

## 2016-11-10 LAB — CBC
HEMATOCRIT: 21.9 % — AB (ref 36.0–46.0)
HEMOGLOBIN: 7.5 g/dL — AB (ref 12.0–15.0)
MCH: 32.1 pg (ref 26.0–34.0)
MCHC: 34.2 g/dL (ref 30.0–36.0)
MCV: 93.6 fL (ref 78.0–100.0)
Platelets: 497 10*3/uL — ABNORMAL HIGH (ref 150–400)
RBC: 2.34 MIL/uL — ABNORMAL LOW (ref 3.87–5.11)
RDW: 14.2 % (ref 11.5–15.5)
WBC: 16.5 10*3/uL — ABNORMAL HIGH (ref 4.0–10.5)

## 2016-11-10 LAB — PREPARE RBC (CROSSMATCH)

## 2016-11-10 MED ORDER — HYOSCYAMINE SULFATE 0.125 MG SL SUBL
0.1250 mg | SUBLINGUAL_TABLET | SUBLINGUAL | Status: DC | PRN
  Filled 2016-11-10: qty 2

## 2016-11-10 MED ORDER — PEG-KCL-NACL-NASULF-NA ASC-C 100 G PO SOLR
0.5000 | Freq: Once | ORAL | Status: AC
  Administered 2016-11-10: 100 g via ORAL
  Filled 2016-11-10: qty 1

## 2016-11-10 MED ORDER — PEG-KCL-NACL-NASULF-NA ASC-C 100 G PO SOLR
0.5000 | Freq: Once | ORAL | Status: AC
  Administered 2016-11-11: 100 g via ORAL
  Filled 2016-11-10: qty 1

## 2016-11-10 MED ORDER — METOPROLOL SUCCINATE ER 25 MG PO TB24
25.0000 mg | ORAL_TABLET | Freq: Every day | ORAL | Status: DC
  Administered 2016-11-11: 25 mg via ORAL
  Filled 2016-11-10: qty 1

## 2016-11-10 MED ORDER — SODIUM CHLORIDE 0.9 % IV SOLN
INTRAVENOUS | Status: DC
  Administered 2016-11-10 – 2016-11-11 (×2): 1000 mL via INTRAVENOUS

## 2016-11-10 MED ORDER — ACETAMINOPHEN 650 MG RE SUPP: 650.0000 mg | Freq: Four times a day (QID) | RECTAL | Status: DC | PRN

## 2016-11-10 MED ORDER — IBUPROFEN 200 MG PO TABS
400.0000 mg | ORAL_TABLET | Freq: Four times a day (QID) | ORAL | Status: DC | PRN
Start: 2016-11-10 — End: 2016-11-11
  Filled 2016-11-10: qty 2

## 2016-11-10 MED ORDER — ALPRAZOLAM 0.5 MG PO TABS
0.5000 mg | ORAL_TABLET | Freq: Every evening | ORAL | Status: DC | PRN
Start: 2016-11-10 — End: 2016-11-11
  Administered 2016-11-10: 0.5 mg via ORAL
  Filled 2016-11-10: qty 1

## 2016-11-10 MED ORDER — SODIUM CHLORIDE 0.9 % IV SOLN: Freq: Once | INTRAVENOUS | Status: DC

## 2016-11-10 MED ORDER — ACETAMINOPHEN 325 MG PO TABS
650.0000 mg | ORAL_TABLET | Freq: Four times a day (QID) | ORAL | Status: DC | PRN
  Administered 2016-11-11: 650 mg via ORAL
  Filled 2016-11-10: qty 2

## 2016-11-10 MED ORDER — PEG-KCL-NACL-NASULF-NA ASC-C 100 G PO SOLR: 1.0000 | Freq: Once | ORAL | Status: DC

## 2016-11-10 MED ORDER — BISACODYL 5 MG PO TBEC: 5.0000 mg | DELAYED_RELEASE_TABLET | Freq: Every day | ORAL | Status: DC | PRN

## 2016-11-10 NOTE — Progress Notes (Signed)
Nursing Note: Pt 's daughter is at the bedside and requested to talk w/ Dr.Sherill,who is her doctor.A: Paged on-call and daughter spoke w/ Dr.Mohammad.wbb

## 2016-11-10 NOTE — Progress Notes (Signed)
Patient's daughter Rodena Piety had requested to get an order for blood transfusion. I called Colletta Maryland. PA and informed about this matter.Per PA she will call Dr. Henrene Pastor.

## 2016-11-10 NOTE — H&P (Signed)
.   Primary Care Physician:  Tawanna Solo, MD Primary Gastroenterologist:  [name], MD  CHIEF COMPLAINT:  Colon mass on CT scan  HPI: Brianna [name] is a 82 y.o. female seen in the office 10/13/16 for abdominal discomfort despite a course of antibiotics prescribed by PCP for presumed diverticulitis. For further evaluation we ordered CTscan which showed an apple core type lesion, locally advanced with possible nodal metastasis at the root of the transverse mesocolon. Patient wished to proceed with colonoscopy.   Patient was hospitalized over the summer with severe anemia and liver abscesses requiring drain placement. 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 Learta Codding. She has not required treatment other than blood transfusion for associated anemia.    Past Medical History:  Diagnosis Date  . Allergy   . Anxiety   . Arthritis   . Asthma   . Bladder infection   . Blood transfusion without reported diagnosis    nine in July 2017  . Clotting disorder ([place])    coldaagluten  . Hyperlipidemia    no meds taken  . Hypertension   . Leaky heart valve   . Liver abscess    July 2017  . Osteoporosis   . Pneumonia   . Thoracic vertebral fracture ([place])    T 11, unsure type of fracture    Past Surgical History:  Procedure Laterality Date  . APPENDECTOMY    . COLONOSCOPY    . FRACTURE SURGERY     left shoulder  . SHOULDER FUSION SURGERY     right shoulder    Prior to Admission medications   Medication Sig Start Date End Date Taking? Authorizing Provider  acetaminophen (TYLENOL) 500 MG tablet Take 1,000 mg by mouth every 6 (six) hours as needed for mild pain.    Yes Historical Provider, MD  ALPRAZolam Duanne Moron) 0.5 MG tablet Take 1 tablet (0.5 mg total) by mouth at bedtime as needed for anxiety. 07/06/16  Yes Cl[place]ford Marisa Hua, MD  hyoscyamine (LEVSIN SL) 0.125 MG SL tablet Place  1-2 tablets (0.125-0.25 mg total) under the tongue as needed. Patient taking differently: Place 0.125-0.25 mg under the tongue as needed for cramping. Depends on pain if  Takes 1 or 2 tablets 10/28/16  Yes Irene Shipper, MD  ibuprofen (ADVIL,MOTRIN) 200 MG tablet Take 400 mg by mouth every 6 (six) hours as needed for mild pain.    Yes Historical Provider, MD  metoprolol succinate (TOPROL-XL) 25 MG 24 hr tablet Take 1 tablet (25 mg total) by mouth daily. 12/13/13  Yes Robbie Lis, MD  Polyethyl Glycol-Propyl Glycol (SYSTANE OP) Apply 2 drops to eye 3 (three) times daily as needed (dry eyes).   Yes Historical Provider, MD    Current Facility-Administered Medications  Medication Dose Route Frequency Provider Last Rate Last Dose  . 0.9 %  sodium chloride infusion   Intravenous Continuous Willia Craze, NP 75 mL/hr at 11/10/16 1600 1,000 mL at 11/10/16 1600  . 0.9 %  sodium chloride infusion   Intravenous Once Willia Craze, NP      . acetaminophen (TYLENOL) tablet 650 mg  650 mg Oral Q6H PRN Willia Craze, NP       Or  . acetaminophen (TYLENOL) suppository 650 mg  650 mg Rectal Q6H PRN Willia Craze, NP      . ALPRAZolam Duanne Moron) tablet 0.5 mg  0.5 mg Oral QHS PRN  Willia Craze, NP      . bisacodyl (DULCOLAX) EC tablet 5 mg  5 mg Oral Daily PRN Willia Craze, NP      . hyoscyamine (LEVSIN SL) SL tablet 0.125-0.25 mg  0.125-0.25 mg Sublingual PRN Willia Craze, NP      . ibuprofen (ADVIL,MOTRIN) tablet 400 mg  400 mg Oral Q6H PRN Willia Craze, NP      . metoprolol succinate (TOPROL-XL) 24 hr tablet 25 mg  25 mg Oral Daily Willia Craze, NP      . Derrill Memo ON 11/11/2016] peg 3350 powder (MOVIPREP) kit 100 g  0.5 kit Oral Once Irene Shipper, MD        Allergies as of 10/28/2016 - Review Complete 10/27/2016  Allergen Reaction Noted  . Other Nausea And Vomiting 06/19/2016  . Compazine [prochlorperazine] Other (See Comments) 10/30/2015  . Erythromycin  12/10/2013  . Lyrica  [pregabalin] Other (See Comments) 10/30/2015  . Promethazine Other (See Comments) 10/30/2015  . Tramadol Nausea And Vomiting 10/30/2015  . Vistaril [hydroxyzine hcl] Other (See Comments) 06/21/2016    Family History  Problem Relation Age of Onset  . Heart disease Mother   . Heart disease Father   . Hypertension Sister   . Stroke Sister   . Cancer Sister   . Pancreatic cancer Sister   . Colon cancer Neg Hx   . Esophageal cancer Neg Hx   . Rectal cancer Neg Hx   . Stomach cancer Neg Hx     Social History   Social History  . Marital status: Widowed    Spouse name: N/A  . Number of children: N/A  . Years of education: N/A   Occupational History  . Not on file.   Social History Main Topics  . Smoking status: Never Smoker  . Smokeless tobacco: Never Used  . Alcohol use Yes     Comment: 1 glass wine per day  . Drug use: No  . Sexual activity: No   Other Topics Concern  . Not on file   Social History Narrative  . No narrative on file    Review of Systems: All systems reviewed and negative except where noted in HPI  Physical Exam: Vital signs in last 24 hours: Temp:  [98.3 F (36.8 C)] 98.3 F (36.8 C) (11/29 1451) Pulse Rate:  [114] 114 (11/29 1451) Resp:  [18] 18 (11/29 1451) BP: (120)/(73) 120/73 (11/29 1451) SpO2:  [93 %] 93 % (11/29 1451) Weight:  [106 lb 4.2 oz (48.2 kg)] 106 lb 4.2 oz (48.2 kg) (11/29 1451) Last BM Date: 11/09/16 General:   Thin, white female in NAD Head:  Normocephalic and atraumatic. Eyes:  Sclera clear, no icterus.   Conjunctiva pink. Ears:  Normal auditory acuity. Nose:  No deformity, discharge,  or lesions. Mouth:  No deformity or lesions.  Oropharynx pink & moist. Neck:  Supple; no masses. Lungs:  Clear throughout to auscultation.   No wheezes, crackles, or rhonchi. No acute distress. Heart:  Regular rate and rhythm; no murmurs, no peripheral edema Abdomen:  Soft, nondistended, mild right mid abdominal tenderness. Normal bowel  sounds, without guarding, and without rebound.   Rectal:  Deferred until time of colonoscopy.   Msk:  Symmetrical without gross deformities. Normal posture. Pulses:  Normal pulses noted. Extremities:  Without clubbing or edema. Neurologic:  Alert and  oriented x4;  grossly normal neurologically. Skin:  Intact without significant lesions or rashes. Cervical Nodes:  No significant cervical  adenopathy. Psych:  Alert and cooperative. Normal mood and affect.  Intake/Output from previous day: No intake/output data recorded. Intake/Output this shift: No intake/output data recorded.  Lab Results:  Recent Labs  11/10/16 1550  WBC 16.5*  HGB 7.5*  HCT 21.9*  PLT 497*   BMET  Recent Labs  11/10/16 1550  NA 130*  K 4.0  CL 96*  CO2 26  GLUCOSE 99  BUN 17  CREATININE 0.43*  CALCIUM 8.6*    Impression / Plan:   73. 82 year old female with transverse colon mass on CT scan, neoplasm suspected.  -She will be placed in Observation for bowel prep and am colonoscopy. -gentle IV hydration -continue home anxiolytic (anxiety) and beta blocker  2. Mild hyponatremia, chronic. Marland Kitchen Na+ 130.  -IV normal saline should help raise serum sodium -am bmet  3. Chronic normocytic anemia. Followed by Dr. Learta Codding, she has cold agglutinin disease. Hgb has drifted since late September (10.2 ---> 8.8-----> 7.5 today). Anemia may also be due in part to colon mass.  -transfuse one unit of blood over 4 hours.  -am CBC  4. Leukocytosis, chronic. 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 Geri Seminole., M.D. [facility] Division of Gastroenterology

## 2016-11-10 NOTE — Progress Notes (Signed)
Patient arrived to unit at around 1430. Alert and oriented x 3. Oriented  to room and environment. Vital signs obtained.Discussed with patient [id number] measures, bed alarm to ensure that prevent fall,refused to comply at first, said that she can get out of bed safely without assistance. We told the patient that we will be in the room when she need [place]. Bed alarm initiated. Call bell within reach. Daughter at bedside. Colletta Maryland notified of patient's arrival, new order received and acknowledge. Will continue to monitor.

## 2016-11-10 NOTE — Progress Notes (Signed)
Colletta Maryland PA notified on the results of CBC and BMET.

## 2016-11-10 NOTE — Progress Notes (Signed)
Daughter ,Rodena Piety notified that patient has an order for  1 unit of PRBC . Per Rodena Piety blood transfusion will require a warmer because of patient's autoimmune disorder. Will endorsed to night nurse.

## 2016-11-10 NOTE — Progress Notes (Signed)
This nurse was present when  Colletta Maryland. PA and pt. made an agreement that it is okay not to use nonskid socks.Patient agreed to keep bed alarm on and also understood to call for assist when getting out of bed. Will endorsed to night nurse.

## 2016-11-10 NOTE — Progress Notes (Signed)
Colletta Maryland PA gave this nurse verbal order to transfuse 1 unit PRBC, type and screen also to check CBC in am.Will endorsed to night nurse.

## 2016-11-11 ENCOUNTER — Encounter (HOSPITAL_COMMUNITY): Payer: Self-pay

## 2016-11-11 ENCOUNTER — Encounter (HOSPITAL_COMMUNITY)
Admission: AD | Disposition: A | Discharge status: Home / Self Care | Payer: Self-pay | Source: Ambulatory Visit | Attending: Internal Medicine

## 2016-11-11 ENCOUNTER — Observation Stay (HOSPITAL_COMMUNITY): Payer: Medicare Other | Admitting: Certified Registered Nurse Anesthetist

## 2016-11-11 DIAGNOSIS — K648 Other hemorrhoids: Secondary | ICD-10-CM | POA: Diagnosis not present

## 2016-11-11 DIAGNOSIS — C184 Malignant neoplasm of transverse colon: Principal | ICD-10-CM

## 2016-11-11 DIAGNOSIS — K639 Disease of intestine, unspecified: Secondary | ICD-10-CM | POA: Diagnosis not present

## 2016-11-11 DIAGNOSIS — J45909 Unspecified asthma, uncomplicated: Secondary | ICD-10-CM | POA: Diagnosis not present

## 2016-11-11 DIAGNOSIS — R933 Abnormal findings on diagnostic imaging of other parts of digestive tract: Secondary | ICD-10-CM | POA: Diagnosis not present

## 2016-11-11 DIAGNOSIS — D591 Other autoimmune hemolytic anemias: Secondary | ICD-10-CM | POA: Diagnosis not present

## 2016-11-11 HISTORY — PX: COLONOSCOPY: SHX5424

## 2016-11-11 LAB — CBC
HCT: 24.3 % — ABNORMAL LOW (ref 36.0–46.0)
Hemoglobin: 8.6 g/dL — ABNORMAL LOW (ref 12.0–15.0)
MCH: 33.9 pg (ref 26.0–34.0)
MCHC: 35.4 g/dL (ref 30.0–36.0)
MCV: 95.7 fL (ref 78.0–100.0)
PLATELETS: 524 10*3/uL — AB (ref 150–400)
RBC: 2.54 MIL/uL — AB (ref 3.87–5.11)
RDW: 14.5 % (ref 11.5–15.5)
WBC: 12.6 10*3/uL — ABNORMAL HIGH (ref 4.0–10.5)

## 2016-11-11 LAB — URINE MICROSCOPIC-ADD ON

## 2016-11-11 LAB — TYPE AND SCREEN
ABO/RH(D): A POS
Antibody Screen: POSITIVE
DAT, IGG: NEGATIVE
UNIT DIVISION: 0

## 2016-11-11 LAB — BASIC METABOLIC PANEL
Anion gap: 9 (ref 5–15)
BUN: 13 mg/dL (ref 6–20)
CALCIUM: 8.4 mg/dL — AB (ref 8.9–10.3)
CHLORIDE: 99 mmol/L — AB (ref 101–111)
CO2: 24 mmol/L (ref 22–32)
CREATININE: 0.46 mg/dL (ref 0.44–1.00)
GFR calc Af Amer: >60 mL/min (ref >60)
GFR calc non Af Amer: >60 mL/min (ref >60)
GLUCOSE: 108 mg/dL — AB (ref 65–99)
Potassium: 4 mmol/L (ref 3.5–5.1)
Sodium: 132 mmol/L — ABNORMAL LOW (ref 135–145)

## 2016-11-11 LAB — URINALYSIS, ROUTINE W REFLEX MICROSCOPIC
Bilirubin Urine: NEGATIVE
GLUCOSE, UA: NEGATIVE mg/dL
Ketones, ur: NEGATIVE mg/dL
Nitrite: NEGATIVE
PROTEIN: NEGATIVE mg/dL
Specific Gravity, Urine: 1.013 (ref 1.005–1.030)
pH: 5.5 (ref 5.0–8.0)

## 2016-11-11 LAB — TRANSFUSION REACTION
DAT C3: POSITIVE
POST RXN DAT IGG: NEGATIVE

## 2016-11-11 SURGERY — COLONOSCOPY
Anesthesia: Monitor Anesthesia Care

## 2016-11-11 SURGERY — COLONOSCOPY
Anesthesia: Moderate Sedation

## 2016-11-11 MED ORDER — DIPHENHYDRAMINE HCL 25 MG PO CAPS
25.0000 mg | ORAL_CAPSULE | Freq: Once | ORAL | Status: AC
  Administered 2016-11-11: 25 mg via ORAL

## 2016-11-11 MED ORDER — PROPOFOL 10 MG/ML IV BOLUS
INTRAVENOUS | Status: AC
  Filled 2016-11-11: qty 40

## 2016-11-11 MED ORDER — BOOST PLUS PO LIQD
237.0000 mL | Freq: Three times a day (TID) | ORAL | Status: DC
  Filled 2016-11-11: qty 237

## 2016-11-11 MED ORDER — PROPOFOL 10 MG/ML IV BOLUS
INTRAVENOUS | Status: DC | PRN
Start: 2016-11-11 — End: 2016-11-11
  Administered 2016-11-11: 10 mg via INTRAVENOUS
  Administered 2016-11-11: 20 mg via INTRAVENOUS

## 2016-11-11 MED ORDER — DIPHENHYDRAMINE HCL 25 MG PO CAPS
ORAL_CAPSULE | ORAL | Status: AC
  Filled 2016-11-11: qty 1

## 2016-11-11 MED ORDER — ONDANSETRON HCL 4 MG/2ML IJ SOLN
INTRAMUSCULAR | Status: AC
  Filled 2016-11-11: qty 2

## 2016-11-11 MED ORDER — ONDANSETRON HCL 4 MG/2ML IJ SOLN
INTRAMUSCULAR | Status: DC | PRN
  Administered 2016-11-11: 4 mg via INTRAVENOUS

## 2016-11-11 MED ORDER — PHENYLEPHRINE HCL 10 MG/ML IJ SOLN
INTRAMUSCULAR | Status: DC | PRN
Start: 2016-11-11 — End: 2016-11-11
  Administered 2016-11-11: 80 ug via INTRAVENOUS

## 2016-11-11 MED ORDER — PROPOFOL 500 MG/50ML IV EMUL
INTRAVENOUS | Status: DC | PRN
  Administered 2016-11-11: 200 ug/kg/min via INTRAVENOUS

## 2016-11-11 NOTE — Progress Notes (Signed)
Nursing Note: Pt feels much better,no shaking,pain much better.Bp146/73 P-120 r-24 PO2 96%.wbb

## 2016-11-11 NOTE — Significant Event (Signed)
Rapid Response Event Note  Overview: Time Called: 0150 Arrival Time: 0155 Event Type: Other (Comment) (possible blood reaction)  Initial Focused Assessment:Patient was receiving blood and woke with chills, rigors and increased pain in back and hips. BP was elevated and heart rate 110's. No c/o respiratory distress.    Interventions:Blood was stopped, MD notified, tylenol and benadryl given, labs drawn  Plan of Care (if not transferred):Monitor V/S, send urine specimen  Event Summary: Name of Physician Notified: DR. Fuller Plan at [place]    at    Outcome: Stayed in room and stabalized  Event End Time: 0235  Pricilla Riffle

## 2016-11-11 NOTE — Interval H&P Note (Signed)
History and Physical Interval Note:  11/11/2016 10:03 AM  Brianna [name]  has presented today for surgery, with the diagnosis of Colon mass  The various methods of treatment have been discussed with the patient and family. After consideration of risks, benefits and other options for treatment, the patient has consented to  Procedure(s) with comments: COLONOSCOPY (N/A) - tba prior to appointment as a surgical intervention .  The patient's history has been reviewed, patient examined, no change in status, stable for surgery.  I have reviewed the patient's chart and labs.  Questions were answered to the patient's satisfaction.    Events of last evening reviewed. Patient is feeling well as morning. Was able to complete her prep. Sodium and hemoglobin both improved. Plan for colonoscopy. Again discussed in detail with patient and her daughter.   [name]

## 2016-11-11 NOTE — Discharge Summary (Signed)
Brianna [name]  Name: Brianna [name] MRN: [id number] DOB: 10-30-1924 82 y.o. PCP:  Tawanna Solo, MD  Date of Admission: 11/10/2016  2:29 PM Date of Discharge: 11/11/2016 Attending Physician: Irene Shipper, MD  Discharge Diagnosis:  1. Transverse colon mass, neoplasm suspected. Biopsies pending at time of discharge.  2. Transfusion reaction, resolved.  3. Normocytic anemia / cold agglutinin disease.  Hgb 7.5 on admission. She had a transfusion reaction so only partial transfusion received. Hgb at discharge was stable at 8.6.   4. Mild hyponatremia. Na remained stable at 130 this admission.   Consultations: Treatment Team:  Ladell Pier, MD  Procedures Performed:  Ct Abdomen Pelvis W Contrast  Result Date: 10/20/2016 CLINICAL DATA:  Persistent abdominal pain. History of multiple liver abscesses with drains in July in August. EXAM: CT ABDOMEN AND PELVIS WITH CONTRAST TECHNIQUE: Multidetector CT imaging of the abdomen and pelvis was performed using the standard protocol following bolus administration of intravenous contrast. CONTRAST:  159mL ISOVUE-300 IOPAMIDOL (ISOVUE-300) INJECTION 61% COMPARISON:  Multiple priors, including 07/20/2016. FINDINGS: Lower chest: Likely post infectious or inflammatory volume loss in the right middle lobe and less so lingula with mild bronchiectasis in the right middle lobe. Mild cardiomegaly, without pericardial or pleural effusion. Tiny hiatal hernia. Hepatobiliary: Improved appearance the liver, with only subtle hypoattenuation on image 16/series 2 in the right lobe, at the site of percutaneous drain previously. No new liver lesions. Normal gallbladder, without biliary ductal dilatation. Pancreas: Small low-density pancreatic lesions are of doubtful clinical significance given patient age and comorbidities. Example similar at 8 mm within the pancreatic body on image 23/series 2. No evidence of acute pancreatitis. Spleen:  Normal in size, without focal abnormality. Adrenals/Urinary Tract: Normal adrenal glands. Too small to characterize lesions in both kidneys. Interpolar left renal lesion measures greater than fluid density, including at 1.2 cm on image 13/series 5. No hydronephrosis. Normal urinary bladder. Stomach/Bowel: Normal remainder of the stomach. Apple core type lesion involves the mid transverse colon, including on image 43/series 2. This continues for an approximately 5.6 cm distance. No obstruction. Probable transmural disease extension, especially anteriorly on image 44/series 2. Normal terminal ileum. Normal small bowel. Vascular/Lymphatic: Aortic and branch vessel atherosclerosis. No retroperitoneal or retrocrural adenopathy. Suspect enlarged nodes at the root of the transverse mesocolon. Example at 9 mm on image 33/series 2. No pelvic sidewall adenopathy. Reproductive: Normal uterus and adnexa. Other: No significant free fluid. Pelvic floor laxity. No evidence of omental or peritoneal disease. Musculoskeletal: Osteopenia. Compression deformities which are mild at L3, moderate L1, and severe T11, similar. IMPRESSION: 1. 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 Apple core type lesion with transmural extension. 2. Suspicion of nodal metastasis at the root of the transverse mesocolon. These results will be called to the ordering clinician or representative by the Radiologist Assistant, and communication documented in the PACS or zVision Dashboard. 3.  Aortic atherosclerosis. 4. Indeterminate left renal lesion. Given well-circumscribed appearance, most likely a complex cyst. This could be re-evaluated at follow-up and is of questionable clinical significance given patient age and comorbidities. Electronically Signed   By: Abigail Miyamoto M.D.   On: 10/20/2016 17:05    GI Procedures:  Colonoscopy - extent to transverse (intended). Large malignant appearing transverse colon mass found.    History/Physical Exam:  See Admission H&P  Admission HPI:  Brianna [name] is a 82 y.o. female seen in the office 10/13/16 for abdominal discomfort despite a course of antibiotics prescribed by  PCP for presumed diverticulitis. For further evaluation we ordered CTscan which showed an apple core type lesion, locally advanced with possible nodal metastasis at the root of the transverse mesocolon. Patient wished to proceed with colonoscopy.   Patient was hospitalized over the summer with severe anemia and liver abscesses requiring drain placement. 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 Learta [name]. She has not required treatment other than blood transfusion for associated anemia.   Hospital Course by problem list: Colon mass. Patient admitted overnight to facilitate bowel prep and undergo colonoscopy. Found to have transverse colon mass corresponding to CTscan findings. Biopsies pending at time of discharge. She already has a follow up with Oncology.  Chronic normocytic anemia / cold agglutinin . Her hgb was less than 7 on admission. Blood was ordered but patient had transfusion reaction and received only partial transfusion. Her hgb was stable, actually increased a gram this admission.   Blood transfusion reaction. Patient developed chills and myalgias during transfusion. Transfusion was stopped, tylenol and benadryl given and blood cultures obtained and pending at time of discharge. Symptoms resolved  Chronic hyponatremia. Sodium stable at 130 this admission   Discharge Vitals:  BP (!) 117/59   Pulse 87   Temp 98 F (36.7 C) (Oral)   Resp 20   Ht 5\' 3"  (1.6 m)   Wt 106 lb (48.1 kg)   SpO2 99%   BMI 18.78 kg/m   Discharge Labs:  Results for orders placed or performed during the hospital encounter of 11/10/16 (from the past 24 hour(s))  Basic metabolic panel     Status: Abnormal    Collection Time: 11/10/16  3:50 PM  Result Value Ref Range   Sodium 130 (L) 135 - 145 mmol/L   Potassium 4.0 3.5 - 5.1 mmol/L   Chloride 96 (L) 101 - 111 mmol/L   CO2 26 22 - 32 mmol/L   Glucose, Bld 99 65 - 99 mg/dL   BUN 17 6 - 20 mg/dL   Creatinine, Ser 0.43 (L) 0.44 - 1.00 mg/dL   Calcium 8.6 (L) 8.9 - 10.3 mg/dL   GFR calc non Af Amer >60 >60 mL/min   GFR calc Af Amer >60 >60 mL/min   Anion gap 8 5 - 15  CBC Once     Status: Abnormal   Collection Time: 11/10/16  3:50 PM  Result Value Ref Range   WBC 16.5 (H) 4.0 - 10.5 K/uL   RBC 2.34 (L) 3.87 - 5.11 MIL/uL   Hemoglobin 7.5 (L) 12.0 - 15.0 g/dL   HCT 21.9 (L) 36.0 - 46.0 %   MCV 93.6 78.0 - 100.0 fL   MCH 32.1 26.0 - 34.0 pg   MCHC 34.2 30.0 - 36.0 g/dL   RDW 14.2 11.5 - 15.5 %   Platelets 497 (H) 150 - 400 K/uL  Prepare RBC     Status: None   Collection Time: 11/10/16  7:00 PM  Result Value Ref Range   Order Confirmation ORDER PROCESSED BY BLOOD BANK   Type and screen [facility]     Status: None   Collection Time: 11/10/16  7:01 PM  Result Value Ref Range   ABO/RH(D) A POS    Antibody Screen POS    Sample Expiration 11/13/2016    DAT, IgG NEG    Antibody Identification NON SPECIFIC COLD ANTIBODY    Unit Number [id number]    Blood Component  Type RED CELLS,LR    Unit division 00    Status of Unit ISSUED,FINAL    Transfusion Status OK TO TRANSFUSE    Crossmatch Result COMPATIBLE   Basic metabolic panel     Status: Abnormal   Collection Time: 11/11/16  2:30 AM  Result Value Ref Range   Sodium 132 (L) 135 - 145 mmol/L   Potassium 4.0 3.5 - 5.1 mmol/L   Chloride 99 (L) 101 - 111 mmol/L   CO2 24 22 - 32 mmol/L   Glucose, Bld 108 (H) 65 - 99 mg/dL   BUN 13 6 - 20 mg/dL   Creatinine, Ser 0.46 0.44 - 1.00 mg/dL   Calcium 8.4 (L) 8.9 - 10.3 mg/dL   GFR calc non Af Amer >60 >60 mL/min   GFR calc Af Amer >60 >60 mL/min   Anion gap 9 5 - 15  CBC     Status: Abnormal   Collection Time: 11/11/16   2:30 AM  Result Value Ref Range   WBC 12.6 (H) 4.0 - 10.5 K/uL   RBC 2.54 (L) 3.87 - 5.11 MIL/uL   Hemoglobin 8.6 (L) 12.0 - 15.0 g/dL   HCT 24.3 (L) 36.0 - 46.0 %   MCV 95.7 78.0 - 100.0 fL   MCH 33.9 26.0 - 34.0 pg   MCHC 35.4 30.0 - 36.0 g/dL   RDW 14.5 11.5 - 15.5 %   Platelets 524 (H) 150 - 400 K/uL  Urinalysis, Routine w reflex microscopic (not at [facility])     Status: Abnormal   Collection Time: 11/11/16  2:48 AM  Result Value Ref Range   Color, Urine AMBER (A) YELLOW   APPearance CLOUDY (A) CLEAR   Specific Gravity, Urine 1.013 1.005 - 1.030   pH 5.5 5.0 - 8.0   Glucose, UA NEGATIVE NEGATIVE mg/dL   Hgb urine dipstick SMALL (A) NEGATIVE   Bilirubin Urine NEGATIVE NEGATIVE   Ketones, ur NEGATIVE NEGATIVE mg/dL   Protein, ur NEGATIVE NEGATIVE mg/dL   Nitrite NEGATIVE NEGATIVE   Leukocytes, UA MODERATE (A) NEGATIVE  Urine microscopic-add on     Status: Abnormal   Collection Time: 11/11/16  2:48 AM  Result Value Ref Range   Squamous Epithelial / LPF 0-5 (A) NONE SEEN   WBC, UA TOO NUMEROUS TO COUNT 0 - 5 WBC/hpf   RBC / HPF 0-5 0 - 5 RBC/hpf   Bacteria, UA MANY (A) NONE SEEN   Casts HYALINE CASTS (A) NEGATIVE  Transfusion reaction     Status: None   Collection Time: 11/11/16  3:05 AM  Result Value Ref Range   Post RXN DAT IgG NEG    DAT C3 POS    Path interp tx rxn null     Disposition and follow-up:   Ms.Josette Amor was discharged from [facility] in stable condition.    Follow-up Appointments: Discharge Instructions    Diet - low sodium heart healthy    Complete by:  As directed    Increase activity slowly    Complete by:  As directed       Discharge Medications:   Medication List    STOP taking these medications   acetaminophen 500 MG tablet Commonly known as:  TYLENOL     TAKE these medications   ALPRAZolam 0.5 MG tablet Commonly known as:  XANAX Take 1 tablet (0.5 mg total) by mouth at bedtime as needed for anxiety.   hyoscyamine  0.125 MG SL tablet Commonly known as:  LEVSIN  SL Place 1-2 tablets (0.125-0.25 mg total) under the tongue as needed. What changed:  reasons to take this  additional instructions   ibuprofen 200 MG tablet Commonly known as:  ADVIL,MOTRIN Take 400 mg by mouth every 6 (six) hours as needed for mild pain.   metoprolol succinate 25 MG 24 hr tablet Commonly known as:  TOPROL-XL Take 1 tablet (25 mg total) by mouth daily.   SYSTANE OP Apply 2 drops to eye 3 (three) times daily as needed (dry eyes).       Signed: Tye Savoy 11/11/2016, 2:40 PM

## 2016-11-11 NOTE — Progress Notes (Signed)
Patient back to unit. Alert and oriented x 3. Vital signs obtained. In good spirit. MD order acknowleged. Will continue to monitor.

## 2016-11-11 NOTE — Anesthesia Postprocedure Evaluation (Signed)
Anesthesia Post Note  Patient: Brianna [name]  Procedure(s) Performed: Procedure(s) (LRB): COLONOSCOPY (N/A)  Patient location during evaluation: Endoscopy Anesthesia Type: MAC Level of consciousness: awake Vital Signs Assessment: post-procedure vital signs reviewed and stable Cardiovascular status: stable Anesthetic complications: no    Last Vitals:  Vitals:   11/11/16 1050 11/11/16 1100  BP: (!) 116/43 (!) 110/46  Pulse: 95 89  Resp: 20 (!) 24  Temp:      Last Pain:  Vitals:   11/11/16 1044  TempSrc: Oral                 EDWARDS,Ellina Sivertsen

## 2016-11-11 NOTE — Progress Notes (Signed)
Nursing Note:Called to room for complaint of chills.Pt shaking vigorously and c/o back pain,bilateral hip pain and and thigh pain.Lungs clear ,diminshed.A: Blood stopped.T-98.6 P-115 R-20 BP-162/104 PO2 98% on r/a.Normal saline infusing @ 75/hr per orders.A: Paged on-call.Paged rapid response.67 Spoke w/Dr.Stark and instructed to stop blood[already stopped at 0140],orders to give tylenol and obtain blood cultures.Went to give tylenol and pt initially refused it.She says the doctor did not want her to take it due to effects to her liver.Rapid response nurse Cheryl at the bedside and pt continues to shiver and shake and has back pain and hip pain.A: Paged on-call again and obtained orders for po benadryl 25 mg.Gave tylenol and benadryl at 0216 as pt decided to go ahead and take tylenol.Pt feels much better and shaking has stopped and pain is much better.Pt has denied any SOB,or chest pain.wbb

## 2016-11-11 NOTE — Anesthesia Postprocedure Evaluation (Signed)
Anesthesia Post Note  Patient: Brianna [name]  Procedure(s) Performed: Procedure(s) (LRB): COLONOSCOPY (N/A)  Patient location during evaluation: Endoscopy Anesthesia Type: MAC Respiratory status: spontaneous breathing Cardiovascular status: stable Anesthetic complications: no    Last Vitals:  Vitals:   11/11/16 0600 11/11/16 0922  BP: 122/68 127/63  Pulse: (!) 121 (!) 118  Resp: 20 (!) 26  Temp: 36.5 C 36.7 C    Last Pain:  Vitals:   11/11/16 0922  TempSrc: Oral                 EDWARDS,Mrk Buzby

## 2016-11-11 NOTE — Progress Notes (Addendum)
Nursing Note: Blood bank was notified and remainder of blood sent back to blood bank,blood specimen to be collected,urine sent .wbb

## 2016-11-11 NOTE — Op Note (Addendum)
[facility] Patient Name: Brianna [name] Procedure Date: 11/11/2016 MRN: [id number] Attending MD: Docia Chuck. Henrene Pastor , MD Date of Birth: 12-22-1923 CSN: [id number] Age: 82 Admit Type: Inpatient Procedure:                Colonoscopy, with biopsies Indications:              Abnormal CT of the GI tract. Transverse colon mass                            in a patient with history of liver abscess Providers:                Docia Chuck. Henrene Pastor, MD, Cleda Daub, RN, Elspeth Cho Tech., Technician, Virgia Land, CRNA Referring MD:             Izola Price. Benay Spice, M.D. Medicines:                Monitored Anesthesia Care Complications:            No immediate complications. Estimated blood loss:                            None. Estimated Blood Loss:     Estimated blood loss: none. Procedure:                Pre-Anesthesia Assessment:                           - Prior to the procedure, a History and Physical                            was performed, and patient medications and                            allergies were reviewed. The patient's tolerance of                            previous anesthesia was also reviewed. The risks                            and benefits of the procedure and the sedation                            options and risks were discussed with the patient.                            All questions were answered, and informed consent                            was obtained. Prior Anticoagulants: The patient has                            taken no previous anticoagulant or antiplatelet  agents. ASA Grade Assessment: III - A patient with                            severe systemic disease. After reviewing the risks                            and benefits, the patient was deemed in                            satisfactory condition to undergo the procedure.                           After obtaining informed consent, the  colonoscope                            was passed under direct vision. Throughout the                            procedure, the patient's blood pressure, pulse, and                            oxygen saturations were monitored continuously. The                            [id number]) scope was introduced through                            the anus and advanced to the the transverse colon                            to examine a mass. This was the intended extent.                            The rectum was photographed. The quality of the                            bowel preparation was excellent. The colonoscopy                            was performed without difficulty. The patient                            tolerated the procedure well. The bowel preparation                            used was MoviPrep. Scope In: 10:20:34 AM Scope Out: 10:35:02 AM Total Procedure Duration: 0 hours 14 minutes 28 seconds  Findings:      An ulcerated subtotally obstructing large malignant-appearing mass was       found in the transverse colon. The mass was circumferential. Mild       friability was present. This was biopsied with a cold forceps for       histology. The endoscope could not be passed beyond the mass.  Internal hemorrhoids were found during retroflexion.      The exam was otherwise without abnormality on direct and retroflexion       views. Impression:               - Malignant partially obstructing tumor in the                            transverse colon. Biopsied.                           - Internal hemorrhoids.                           - The examination was otherwise normal on direct                            and retroflexion views. Moderate Sedation:      none Recommendation:           1. Okay for discharge home later today                           2. Diet as tolerated                           3. Keep your follow-up oncology and surgery                             appointments as already scheduled                           4. Await pathology results. Procedure Code(s):        --- Professional ---                           [phone], 28, Colonoscopy, flexible; with biopsy,                            single or multiple Diagnosis Code(s):        --- Professional ---                           C18.4, Malignant neoplasm of transverse colon                           K64.8, Other hemorrhoids                           R93.3, Abnormal findings on diagnostic imaging of                            other parts of digestive tract CPT copyright 2016 [employer]. All rights reserved. The codes documented in this report are preliminary and upon coder review may  be revised to meet current compliance requirements. Docia Chuck. Henrene Pastor, MD 11/11/2016 10:49:50 AM This report has been signed electronically. Number of Addenda: 0

## 2016-11-11 NOTE — Anesthesia Preprocedure Evaluation (Addendum)
Anesthesia Evaluation  Patient identified by MRN, date of birth, ID band Patient awake    Reviewed: Allergy & Precautions, NPO status , Patient's Chart, lab work & pertinent test results  Airway Mallampati: II  TM Distance: >3 FB     Dental   Pulmonary asthma , pneumonia,    breath sounds clear to auscultation       Cardiovascular hypertension,  Rhythm:Regular Rate:Normal     Neuro/Psych    GI/Hepatic Neg liver ROS, History noted. CE   Endo/Other  negative endocrine ROS  Renal/GU negative Renal ROS     Musculoskeletal  (+) Arthritis ,   Abdominal   Peds  Hematology  (+) anemia ,   Anesthesia Other Findings   Reproductive/Obstetrics                             Anesthesia Physical Anesthesia Plan  ASA: III  Anesthesia Plan:    Post-op Pain Management:    Induction: Intravenous  Airway Management Planned: Simple Face Mask  Additional Equipment:   Intra-op Plan:   Post-operative Plan:   Informed Consent: I have reviewed the patients History and Physical, chart, labs and discussed the procedure including the risks, benefits and alternatives for the proposed anesthesia with the patient or authorized representative who has indicated his/her understanding and acceptance.   Dental advisory given  Plan Discussed with: CRNA and Anesthesiologist  Anesthesia Plan Comments:         Anesthesia Quick Evaluation

## 2016-11-11 NOTE — Progress Notes (Signed)
Initial Nutrition Assessment  DOCUMENTATION CODES:   Severe malnutrition in context of chronic illness  INTERVENTION:  Boost Breeze po TID, each supplement provides 250 kcal and 9 grams of protein  NUTRITION DIAGNOSIS:   Malnutrition related to catabolic illness as evidenced by severe depletion of body fat, severe depletion of muscle mass.  GOAL:   Patient will meet greater than or equal to 90% of their needs  MONITOR:   PO intake, Supplement acceptance  REASON FOR ASSESSMENT:   Malnutrition Screening Tool    ASSESSMENT:   82 y/o female presented today for surgery, with the diagnosis of Colon mass. Colonoscopy with biopsies   Met with pt in room today. Patient ate 100% lunch and reports good appetite. Pt has lost 29lbs(22%) over the past 4 months. Pt reports good appetite pta but states that the weigh is just falling off. Patient does not drink Ensures; likes [employer]. Spoke to pt about the importance of eating enough calories and protein; especially before surgery. (pt is planning to have surgery to remove mass).    Medications reviewed and include: dulcolax  Labs reviewed: Na 132(L), Cl 99(L), Ca 8.4(L), Alb 3.3(L). Tbili- 2.43(H) WBC 12.6(H), Hgb 8.6(L), Hct 24.3(L)  Nutrition-Focused physical exam completed. Findings are severe fat depletion, severe muscle depletion, and no edema.   Diet Order:  Diet regular Room service appropriate? Yes; Fluid consistency: Thin  Skin:  Reviewed, no issues  Last BM:  11/29  Height:   Ht Readings from Last 1 Encounters:  11/11/16 5' 3"  (1.6 m)    Weight:   Wt Readings from Last 1 Encounters:  11/11/16 106 lb (48.1 kg)    Ideal Body Weight:  52.3 kg  BMI:  Body mass index is 18.78 kg/m.  Estimated Nutritional Needs:   Kcal:  1450-1700kcal/day   Protein:  72-82g/day   Fluid:  1.5L/day   EDUCATION NEEDS:   No education needs identified at this time  Brianna [name], RD, LDN

## 2016-11-11 NOTE — Transfer of Care (Signed)
Immediate Anesthesia Transfer of Care Note  Patient: Brianna [name]  Procedure(s) Performed: Procedure(s) with comments: COLONOSCOPY (N/A) - tba prior to appointment  Patient Location: PACU  Anesthesia Type:MAC  Level of Consciousness:  sedated, patient cooperative and responds to stimulation  Airway & Oxygen Therapy:Patient Spontanous Breathing and Patient connected to face mask oxgen  Post-op Assessment:  Report given to PACU RN and Post -op Vital signs reviewed and stable  Post vital signs:  Reviewed and stable  Last Vitals:  Vitals:   11/11/16 0600 11/11/16 0922  BP: 122/68 127/63  Pulse: (!) 121 (!) 118  Resp: 20 (!) 26  Temp: 36.5 C [id number] C    Complications: No apparent anesthesia complications

## 2016-11-11 NOTE — Progress Notes (Signed)
Patient tolerated her diet. No  N/v.

## 2016-11-11 NOTE — Progress Notes (Signed)
Patient d/c home. Stable. 

## 2016-11-11 NOTE — Progress Notes (Signed)
Discharge instructions rendered,patient familiar with home meds, no questions asked. Patient is comfortable. No c/o pain. Daughter at bedside.

## 2016-11-12 ENCOUNTER — Other Ambulatory Visit: Payer: Self-pay | Admitting: Internal Medicine

## 2016-11-12 ENCOUNTER — Encounter (HOSPITAL_COMMUNITY): Payer: Self-pay | Admitting: Internal Medicine

## 2016-11-12 DIAGNOSIS — K599 Functional intestinal disorder, unspecified: Secondary | ICD-10-CM

## 2016-11-16 ENCOUNTER — Ambulatory Visit: Payer: Self-pay | Admitting: Surgery

## 2016-11-16 LAB — CULTURE, BLOOD (ROUTINE X 2)
CULTURE: NO GROWTH
Culture: NO GROWTH

## 2016-11-17 ENCOUNTER — Encounter: Payer: Self-pay | Admitting: *Deleted

## 2016-11-17 ENCOUNTER — Ambulatory Visit (HOSPITAL_COMMUNITY)
Admission: RE | Admit: 2016-11-17 | Discharge: 2016-11-17 | Disposition: A | Discharge status: Home / Self Care | Payer: Medicare Other | Source: Ambulatory Visit | Attending: Oncology | Admitting: Oncology

## 2016-11-17 ENCOUNTER — Other Ambulatory Visit: Payer: Self-pay | Admitting: *Deleted

## 2016-11-17 ENCOUNTER — Ambulatory Visit (HOSPITAL_BASED_OUTPATIENT_CLINIC_OR_DEPARTMENT_OTHER): Payer: Medicare Other | Admitting: Oncology

## 2016-11-17 ENCOUNTER — Other Ambulatory Visit (HOSPITAL_BASED_OUTPATIENT_CLINIC_OR_DEPARTMENT_OTHER): Payer: Medicare Other

## 2016-11-17 VITALS — BP 118/51 | HR 93 | Temp 98.4°F | Resp 18 | Ht 63.0 in | Wt 110.9 lb

## 2016-11-17 DIAGNOSIS — D5912 Cold autoimmune hemolytic anemia: Secondary | ICD-10-CM

## 2016-11-17 DIAGNOSIS — C184 Malignant neoplasm of transverse colon: Secondary | ICD-10-CM | POA: Diagnosis present

## 2016-11-17 DIAGNOSIS — D591 Other autoimmune hemolytic anemias: Secondary | ICD-10-CM

## 2016-11-17 LAB — COMPREHENSIVE METABOLIC PANEL
ALBUMIN: 3.2 g/dL — AB (ref 3.5–5.0)
ALK PHOS: 82 U/L (ref 40–150)
ALT: 15 U/L (ref 0–55)
AST: 16 U/L (ref 5–34)
Anion Gap: 10 mEq/L (ref 3–11)
BILIRUBIN TOTAL: 3.5 mg/dL — AB (ref 0.20–1.20)
BUN: 13.3 mg/dL (ref 7.0–26.0)
CALCIUM: 9.1 mg/dL (ref 8.4–10.4)
CO2: 27 mEq/L (ref 22–29)
CREATININE: 0.6 mg/dL (ref 0.6–1.1)
Chloride: 95 mEq/L — ABNORMAL LOW (ref 98–109)
EGFR: 79 mL/min/{1.73_m2} — ABNORMAL LOW (ref >90)
Glucose: 101 mg/dl (ref 70–140)
POTASSIUM: 4.4 meq/L (ref 3.5–5.1)
Sodium: 131 mEq/L — ABNORMAL LOW (ref 136–145)
TOTAL PROTEIN: 7 g/dL (ref 6.4–8.3)

## 2016-11-17 LAB — CBC & DIFF AND RETIC
BASO%: 0.4 % (ref 0.0–2.0)
Basophils Absolute: 0.1 10*3/uL (ref 0.0–0.1)
EOS%: 0.7 % (ref 0.0–7.0)
Eosinophils Absolute: 0.1 10*3/uL (ref 0.0–0.5)
HEMATOCRIT: 23.1 % — AB (ref 34.8–46.6)
HGB: 7.9 g/dL — ABNORMAL LOW (ref 11.6–15.9)
Immature Retic Fract: 7.6 % (ref 1.60–10.00)
LYMPH%: 11.8 % — AB (ref 14.0–49.7)
MCH: 31.7 pg (ref 25.1–34.0)
MCV: 92.8 fL (ref 79.5–101.0)
MONO#: 1.2 10*3/uL — ABNORMAL HIGH (ref 0.1–0.9)
MONO%: 6.4 % (ref 0.0–14.0)
NEUT%: 80.7 % — ABNORMAL HIGH (ref 38.4–76.8)
NEUTROS ABS: 14.8 10*3/uL — AB (ref 1.5–6.5)
PLATELETS: 539 10*3/uL — AB (ref 145–400)
RBC: 2.49 10*6/uL — AB (ref 3.70–5.45)
RDW: 14.7 % — ABNORMAL HIGH (ref 11.2–14.5)
Retic %: 6.59 % — ABNORMAL HIGH (ref 0.70–2.10)
Retic Ct Abs: 164.09 10*3/uL — ABNORMAL HIGH (ref 33.70–90.70)
WBC: 18.3 10*3/uL — AB (ref 3.9–10.3)
lymph#: 2.2 10*3/uL (ref 0.9–3.3)
nRBC: 0 % (ref 0–0)

## 2016-11-17 LAB — PREPARE RBC (CROSSMATCH)

## 2016-11-17 LAB — TECHNOLOGIST REVIEW

## 2016-11-17 MED ORDER — ACETAMINOPHEN 325 MG PO TABS: 650.0000 mg | ORAL_TABLET | Freq: Once | ORAL | Status: DC

## 2016-11-17 MED ORDER — DIPHENHYDRAMINE HCL 25 MG PO CAPS: 25.0000 mg | ORAL_CAPSULE | Freq: Once | ORAL | Status: DC

## 2016-11-17 NOTE — Progress Notes (Signed)
Oncology Nurse Navigator Documentation  Oncology Nurse Navigator Flowsheets 11/17/2016  Navigator Location [facility]  Navigator Encounter Type Letter/Fax/Email  Barriers/Navigation Needs Coordination of Care  Interventions Coordination of Care  Coordination of Care Appts  Acuity Level 1  Sent patient Mychart message to introduce her to navigator. Mailed her follow up appointment with Dr. Benay Spice and business card with contact # for nurse navigator to her home.

## 2016-11-17 NOTE — Progress Notes (Signed)
  [place] OFFICE PROGRESS NOTE   Diagnosis: Cold agglutinin disease, colon cancer  INTERVAL HISTORY:   Brianna [name] returns as scheduled. She was admitted 11/11/2016 for a colonoscopy. She developed chills and back pain during a red cell transfusion. There was no laboratory evidence of a hemolytic transfusion reaction. She underwent a colonoscopy by Dr. Henrene Pastor 11/11/2016. A partially obstructing tumor was noted in the transverse colon. The mass was biopsied. The colonoscope could not pass the mass. The pathology confirmed adenocarcinoma. She reports feeling weak today. She has exertional dyspnea. She continues to have abdominal pain. She saw Dr. Harlow Asa earlier this week and is scheduled for resection of the colon tumor on 11/25/2016.   Objective:  Vital signs in last 24 hours:  Blood pressure (!) 118/51, pulse 93, temperature 98.4 F (36.9 C), temperature source Oral, resp. rate 18, height '5\' 3"'$  (1.6 m), weight 110 lb 14.4 oz (50.3 kg), SpO2 93 %.    Resp: Lungs clear bilaterally Cardio: Regular rate and rhythm GI: No hepatomegaly, tender in the left lower abdomen-palpable mass? Vascular: No leg edema  Lab Results:  Lab Results  Component Value Date   WBC 18.3 (H) 11/17/2016   HGB 7.9 (L) 11/17/2016   HCT 23.1 (L) 11/17/2016   MCV 92.8 11/17/2016   PLT 539 (H) 11/17/2016   NEUTROABS 14.8 (H) 11/17/2016     Medications: I have reviewed the patient's current medications.  Assessment/Plan: 1. Admission with fever and liver abscesses 06/19/2016  Status post placement of a liver drain on 06/21/2010, New drain placed 06/30/2016  Treated with broad-spectrum intravenous antibiotics, completed outpatient course of Augmentin   CT 07/20/2016-no residual abscess identified  CT 10/20/2016-improved appearance of the liver; no new liver lesions; apple core type lesion involving the mid transverse colon continuing for approximately 5.6 cm. No obstruction. Probable  transmural disease extension especially anteriorly. Suspected enlarged nodes at the root of the transverse mesocolon; no pelvic sidewall adenopathy.  2. 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 Hyperbilirubinemia secondary to hemolysis and liver abscesses  5. CT abdomen/pelvis 10/20/2016-improved appearance of the liver; no new liver lesions; apple core type lesion involving the mid transverse colon continuing for approximately 5.6 cm. No obstruction. Probable transmural disease extension especially anteriorly. Suspected enlarged nodes at the root of the transverse mesocolon; no pelvic sidewall adenopathy.  Colonoscopy 11/11/2016 confirmed a transverse colon mass, biopsy revealed adenocarcinoma    Disposition:  Brianna [name] has been diagnosed with colon cancer. She is scheduled for surgery next week.  She has persistent anemia secondary to cold agglutinin disease. The anemia may also be in part related to bleeding from the colon tumor. She had a febrile reaction with a red cell transfusion last week. She will be scheduled for a red cell transfusion today.  I will see Brianna [name] when she is hospitalized for surgery next week.  She will return for an office visit 12/09/2016.  Betsy Coder, MD  11/17/2016  11:49 AM

## 2016-11-17 NOTE — Progress Notes (Signed)
Pt to receive 2 units of PRBC's per order of Dr. Benay Spice.  Per blood bank, blood will not be ready for transfusion until after 4:00PM.  Patient scheduled to receive 2 units of PRBC's at [facility] tomorrow at 0800.  Pt and pt's daughter aware of appt and given map to clinic by infusion room.

## 2016-11-18 ENCOUNTER — Telehealth: Payer: Self-pay | Admitting: *Deleted

## 2016-11-18 ENCOUNTER — Encounter (HOSPITAL_COMMUNITY): Payer: Self-pay

## 2016-11-18 ENCOUNTER — Other Ambulatory Visit: Payer: Self-pay | Admitting: *Deleted

## 2016-11-18 ENCOUNTER — Ambulatory Visit (HOSPITAL_COMMUNITY)
Admission: RE | Admit: 2016-11-18 | Discharge: 2016-11-18 | Disposition: A | Discharge status: Home / Self Care | Payer: Medicare Other | Source: Ambulatory Visit | Attending: Hematology and Oncology | Admitting: Hematology and Oncology

## 2016-11-18 DIAGNOSIS — K6389 Other specified diseases of intestine: Secondary | ICD-10-CM

## 2016-11-18 NOTE — Progress Notes (Signed)
Notified from blood bank that they are trying to contact Dr. Benay Spice about the blood crossmatch and to wait until they call us back to proceed. Patient and daughter aware.

## 2016-11-18 NOTE — Telephone Encounter (Signed)
Message received from patient requesting plan for blood transfusion.  Call placed back to patient and received patient's answering machine.  Requested for patient to call [place] back for instructions regarding appt on Monday.

## 2016-11-18 NOTE — Progress Notes (Signed)
Daughter here to pick up patient. Explained to her that the blood transfusion was cancelled for today and advised that the [facility] would call Brianna [name] today to let her know what the plan to proceed would be. Patient alert, oriented and ambulatory with assistance to the restroom. Discharged to home with daughter. Three pink top tubes of blood drawn and delivered to the blood bank as advised.

## 2016-11-18 NOTE — Telephone Encounter (Signed)
Call received from [facility] in blood bank stating that pt will need 3 further tubes of blood drawn to be sent to outside lab for interpretation of blood type. Beth states that blood sample will not be available until Saturday at the latest and that pt will need to be rescheduled for blood transfusion.  Beth states that she will contact [facility] to update them since patient is currently with them awaiting transfusion.

## 2016-11-18 NOTE — Procedures (Signed)
Beth from blood bank called and asked that we draw 3 pink tubes for patient and send it to lab.  Blood specimens are being sent off to reference lab.  Beth has spoken with Lorriane Shire (RN @ [facility]) and patient is to go home and wait for Lorriane Shire to call her this weekend.  Per beth, the current blood band will expire so she advised me to cut it off the patient as she will get a new one on Monday.

## 2016-11-18 NOTE — Telephone Encounter (Signed)
Call placed back to patient to inform her per blood bank, blood may be ready by Friday afternoon, Saturday at the latest so appt has been made for pt to receive both units of PRBC's on Monday at 0800 at [facility].  Patient's daughter requests if blood is available tomorrow, that patient receives transfusion on Saturday, 11/20/16 d/t she is flying back to [place] on Sunday and will have no way of getting [facility] to [facility] on Monday.

## 2016-11-19 ENCOUNTER — Other Ambulatory Visit: Payer: Self-pay | Admitting: *Deleted

## 2016-11-19 ENCOUNTER — Other Ambulatory Visit: Payer: Medicare Other

## 2016-11-19 ENCOUNTER — Telehealth: Payer: Self-pay | Admitting: *Deleted

## 2016-11-19 DIAGNOSIS — D591 Other autoimmune hemolytic anemias: Secondary | ICD-10-CM | POA: Diagnosis not present

## 2016-11-19 DIAGNOSIS — D5912 Cold autoimmune hemolytic anemia: Secondary | ICD-10-CM

## 2016-11-19 LAB — TYPE AND SCREEN
BLOOD PRODUCT EXPIRATION DATE: 201712212359
BLOOD PRODUCT EXPIRATION DATE: 201801152359
BLOOD PRODUCT EXPIRATION DATE: 201801162359
Blood Product Expiration Date: 201712212359
ISSUE DATE / TIME: 201712081213
ISSUE DATE / TIME: 201712081213
UNIT TYPE AND RH: 5100
UNIT TYPE AND RH: 6200
Unit Type and Rh: 5100
Unit Type and Rh: 6200

## 2016-11-19 LAB — PREPARE RBC (CROSSMATCH)

## 2016-11-19 NOTE — Telephone Encounter (Signed)
Call from blood bank, 2 compatible units ready for patient. Called pt, she reports blood bank band was cut off by nurse at [facility] yesterday.  Pt will need to be re- crossmatched. She is unable to come in now, has an appt with her lawyer. Pt will be here at 3PM to have crossmatch drawn. Informed her of appt for 12/9 at 0830.

## 2016-11-20 ENCOUNTER — Ambulatory Visit: Payer: Medicare Other

## 2016-11-20 DIAGNOSIS — D591 Other autoimmune hemolytic anemias: Principal | ICD-10-CM

## 2016-11-20 DIAGNOSIS — D5912 Cold autoimmune hemolytic anemia: Secondary | ICD-10-CM

## 2016-11-20 MED ORDER — ACETAMINOPHEN 325 MG PO TABS
650.0000 mg | ORAL_TABLET | Freq: Once | ORAL | Status: AC
  Administered 2016-11-20: 650 mg via ORAL

## 2016-11-20 MED ORDER — DIPHENHYDRAMINE HCL 25 MG PO CAPS
25.0000 mg | ORAL_CAPSULE | Freq: Once | ORAL | Status: AC
  Administered 2016-11-20: 25 mg via ORAL

## 2016-11-20 MED ORDER — SODIUM CHLORIDE 0.9 % IV SOLN: 250.0000 mL | Freq: Once | INTRAVENOUS | Status: DC

## 2016-11-20 MED ORDER — ACETAMINOPHEN 325 MG PO TABS
ORAL_TABLET | ORAL | Status: AC
  Filled 2016-11-20: qty 2

## 2016-11-20 MED ORDER — DIPHENHYDRAMINE HCL 25 MG PO CAPS
ORAL_CAPSULE | ORAL | Status: AC
  Filled 2016-11-20: qty 1

## 2016-11-20 MED ORDER — SODIUM CHLORIDE 0.9 % IV SOLN
250.0000 mL | Freq: Once | INTRAVENOUS | Status: AC
  Administered 2016-11-20: 250 mL via INTRAVENOUS

## 2016-11-22 ENCOUNTER — Encounter (HOSPITAL_COMMUNITY): Payer: Medicare Other

## 2016-11-22 ENCOUNTER — Telehealth: Payer: Self-pay | Admitting: *Deleted

## 2016-11-22 ENCOUNTER — Other Ambulatory Visit: Payer: Self-pay | Admitting: *Deleted

## 2016-11-22 DIAGNOSIS — D5912 Cold autoimmune hemolytic anemia: Secondary | ICD-10-CM

## 2016-11-22 DIAGNOSIS — D591 Other autoimmune hemolytic anemias: Principal | ICD-10-CM

## 2016-11-22 LAB — TYPE AND SCREEN
ABO/RH(D): A POS
Antibody Screen: POSITIVE
DAT, IGG: NEGATIVE
UNIT DIVISION: 0
Unit division: 0

## 2016-11-22 NOTE — Telephone Encounter (Signed)
Patient called wanting to change her appt for lab before her surgery.  Dr. Harlow Asa is doing surgery on 12/14 and she wants lab done on 11/23/16 as she is hard to match for a blood transfusion.  Advised her to call Dr. Harlow Asa [phone] and let him know as he is the one to order this.  She appreciated my help with this.

## 2016-11-22 NOTE — Telephone Encounter (Signed)
Call from pt's daughter requesting lab appointment for crossmatch. She is concerned that pt may be admitted late prior to surgery on 12/14. Wants to be sure there is compatible blood for surgery. Informed her we will have schedulers call with lab appt. Called blood bank, only one tube is needed for crossmatch. Message to schedulers for lab appt.

## 2016-11-23 ENCOUNTER — Other Ambulatory Visit: Payer: Medicare Other

## 2016-11-23 DIAGNOSIS — D5912 Cold autoimmune hemolytic anemia: Secondary | ICD-10-CM

## 2016-11-23 DIAGNOSIS — D591 Other autoimmune hemolytic anemias: Principal | ICD-10-CM

## 2016-11-23 LAB — SAMPLE TO BLOOD BANK

## 2016-11-24 ENCOUNTER — Encounter (HOSPITAL_COMMUNITY): Payer: Self-pay

## 2016-11-24 ENCOUNTER — Telehealth: Payer: Self-pay

## 2016-11-24 ENCOUNTER — Inpatient Hospital Stay (HOSPITAL_COMMUNITY)
Admission: AD | Admit: 2016-11-24 | Discharge: 2016-11-29 | DRG: 330 | Disposition: A | Discharge status: Home Health | Payer: Medicare Other | Source: Ambulatory Visit | Attending: Surgery | Admitting: Surgery

## 2016-11-24 DIAGNOSIS — Z86010 Personal history of colonic polyps: Secondary | ICD-10-CM

## 2016-11-24 DIAGNOSIS — C772 Secondary and unspecified malignant neoplasm of intra-abdominal lymph nodes: Secondary | ICD-10-CM | POA: Diagnosis present

## 2016-11-24 DIAGNOSIS — M1990 Unspecified osteoarthritis, unspecified site: Secondary | ICD-10-CM | POA: Diagnosis present

## 2016-11-24 DIAGNOSIS — K567 Ileus, unspecified: Secondary | ICD-10-CM | POA: Diagnosis not present

## 2016-11-24 DIAGNOSIS — I10 Essential (primary) hypertension: Secondary | ICD-10-CM | POA: Diagnosis present

## 2016-11-24 DIAGNOSIS — C184 Malignant neoplasm of transverse colon: Secondary | ICD-10-CM | POA: Diagnosis present

## 2016-11-24 DIAGNOSIS — Z8249 Family history of ischemic heart disease and other diseases of the circulatory system: Secondary | ICD-10-CM

## 2016-11-24 DIAGNOSIS — D598 Other acquired hemolytic anemias: Secondary | ICD-10-CM

## 2016-11-24 DIAGNOSIS — F419 Anxiety disorder, unspecified: Secondary | ICD-10-CM | POA: Diagnosis present

## 2016-11-24 DIAGNOSIS — C189 Malignant neoplasm of colon, unspecified: Secondary | ICD-10-CM | POA: Diagnosis present

## 2016-11-24 DIAGNOSIS — D649 Anemia, unspecified: Secondary | ICD-10-CM

## 2016-11-24 DIAGNOSIS — K219 Gastro-esophageal reflux disease without esophagitis: Secondary | ICD-10-CM | POA: Diagnosis present

## 2016-11-24 DIAGNOSIS — D591 Other autoimmune hemolytic anemias: Secondary | ICD-10-CM | POA: Diagnosis present

## 2016-11-24 DIAGNOSIS — Z800 Family history of malignant neoplasm of digestive organs: Secondary | ICD-10-CM

## 2016-11-24 DIAGNOSIS — D5912 Cold autoimmune hemolytic anemia: Secondary | ICD-10-CM

## 2016-11-24 DIAGNOSIS — K660 Peritoneal adhesions (postprocedural) (postinfection): Secondary | ICD-10-CM | POA: Diagnosis present

## 2016-11-24 LAB — CBC
HEMATOCRIT: 23.8 % — AB (ref 36.0–46.0)
Hemoglobin: 8.5 g/dL — ABNORMAL LOW (ref 12.0–15.0)
MCH: 32.7 pg (ref 26.0–34.0)
MCHC: 35.7 g/dL (ref 30.0–36.0)
MCV: 91.5 fL (ref 78.0–100.0)
Platelets: 536 10*3/uL — ABNORMAL HIGH (ref 150–400)
RBC: 2.6 MIL/uL — ABNORMAL LOW (ref 3.87–5.11)
RDW: 16.2 % — AB (ref 11.5–15.5)
WBC: 20 10*3/uL — AB (ref 4.0–10.5)

## 2016-11-24 LAB — PREPARE RBC (CROSSMATCH)

## 2016-11-24 MED ORDER — ACETAMINOPHEN 650 MG RE SUPP
650.0000 mg | Freq: Four times a day (QID) | RECTAL | Status: DC | PRN
Start: 2016-11-24 — End: 2016-11-25

## 2016-11-24 MED ORDER — ONDANSETRON 4 MG PO TBDP: 4.0000 mg | ORAL_TABLET | Freq: Four times a day (QID) | ORAL | Status: DC | PRN

## 2016-11-24 MED ORDER — POLYETHYLENE GLYCOL 3350 17 GM/SCOOP PO POWD
238.0000 g | Freq: Once | ORAL | Status: DC
  Filled 2016-11-24: qty 238

## 2016-11-24 MED ORDER — ONDANSETRON HCL 4 MG/2ML IJ SOLN
4.0000 mg | Freq: Four times a day (QID) | INTRAMUSCULAR | Status: DC | PRN
  Administered 2016-11-24: 4 mg via INTRAVENOUS
  Filled 2016-11-24: qty 2

## 2016-11-24 MED ORDER — SODIUM CHLORIDE 0.45 % IV SOLN
INTRAVENOUS | Status: DC
  Administered 2016-11-24: 16:00:00 via INTRAVENOUS

## 2016-11-24 MED ORDER — METRONIDAZOLE 500 MG PO TABS
1000.0000 mg | ORAL_TABLET | Freq: Three times a day (TID) | ORAL | Status: AC
  Administered 2016-11-24 (×2): 1000 mg via ORAL
  Filled 2016-11-24 (×2): qty 2

## 2016-11-24 MED ORDER — DIPHENHYDRAMINE HCL 50 MG/ML IJ SOLN
12.5000 mg | Freq: Four times a day (QID) | INTRAMUSCULAR | Status: DC | PRN
Start: 2016-11-24 — End: 2016-11-25
  Administered 2016-11-24: 12.5 mg via INTRAVENOUS
  Filled 2016-11-24: qty 1

## 2016-11-24 MED ORDER — NEOMYCIN SULFATE 500 MG PO TABS
1000.0000 mg | ORAL_TABLET | Freq: Three times a day (TID) | ORAL | Status: AC
  Administered 2016-11-24 (×2): 1000 mg via ORAL
  Filled 2016-11-24 (×2): qty 2

## 2016-11-24 MED ORDER — SODIUM CHLORIDE 0.9 % IV SOLN
1.0000 g | Freq: Once | INTRAVENOUS | Status: DC
  Filled 2016-11-24: qty 1

## 2016-11-24 MED ORDER — DIPHENHYDRAMINE HCL 12.5 MG/5ML PO ELIX: 12.5000 mg | ORAL_SOLUTION | Freq: Four times a day (QID) | ORAL | Status: DC | PRN

## 2016-11-24 MED ORDER — ACETAMINOPHEN 325 MG PO TABS
650.0000 mg | ORAL_TABLET | Freq: Four times a day (QID) | ORAL | Status: DC | PRN
  Administered 2016-11-25: 650 mg via ORAL
  Filled 2016-11-24: qty 2

## 2016-11-24 MED ORDER — POLYETHYLENE GLYCOL 3350 17 G PO PACK
238.0000 g | PACK | Freq: Once | ORAL | Status: DC
  Administered 2016-11-24: 238 g via ORAL
  Filled 2016-11-24: qty 14

## 2016-11-24 MED ORDER — SODIUM CHLORIDE 0.9 % IV SOLN: Freq: Once | INTRAVENOUS | Status: AC

## 2016-11-24 NOTE — Telephone Encounter (Signed)
Brooke preparing pre-op, asking if patient will need a transfusion prior to surgery.  I advised Jerene Pitch that patient was transfused Saturday and that Dr. Benay Spice will need to see updated lab values in order to make that decision.    Brooke advised she will have a CBC done and contact clinic if pt needs to be transfused.    Routed to Pod 2

## 2016-11-24 NOTE — Progress Notes (Signed)
Currently taking bowel preop and seems to be tolerating it.

## 2016-11-24 NOTE — H&P (Signed)
Brianna [name] DOB: Aug 28, 1924 Widowed / Language: [name] / Race: White Female  History of Present Illness  The patient is a 82 year old female who presents with colorectal cancer.  Patient is referred by Dr. Julieanne Manson for surgical evaluation of transverse colon carcinoma with partial obstruction. Patient's primary care physician is Dr. Kathyrn Lass. Patient's gastroenterologist is Dr. [name]. Patient had originally presented with liver abscesses during the summer of 2017. She require percutaneous drainage and antibiotics. Patient has had persistent abdominal pain. She underwent CT scan on October 20, 2016 which demonstrated an apple core lesion in the mid transverse colon measuring approximately 5.6 cm in size. There appeared to be transmural disease. There appears to be involvement of lymph nodes at the root of the transverse mesocolon. Patient subsequently underwent colonoscopy by Dr. [name]. Biopsies confirmed adenocarcinoma. Patient is having partial obstructive symptoms with abdominal pain, cramping, and change in caliber of bowel movements. Denies any bleeding per rectum but she does have significant anemia and has required transfusion. Previous abdominal surgery includes appendectomy as a young adult. Patient presents today accompanied by her daughters to discuss surgical resection. There is no family history of colon cancer. Patient has had previous colonoscopy, her last study approximately 8 years ago in [place].   Other Problems Anxiety Disorder  Arthritis  Back Pain  Gastroesophageal Reflux Disease  High blood pressure  Other disease, cancer, significant illness  Transfusion history   Past Surgical History Appendectomy  Colon Polyp Removal - Colonoscopy  Shoulder Surgery  Right. Tonsillectomy   Diagnostic Studies History Colonoscopy  within last year Mammogram  >3 years ago Pap Smear  >5 years ago  Allergies  Compazine  *ANTIPSYCHOTICS/ANTIMANIC AGENTS*  Nausea, Vomiting. Lyrica *ANTICONVULSANTS*  Rash. TraMADol HCl *ANALGESICS - OPIOID*  Nausea, Vomiting. Vistaril *ANTIANXIETY AGENTS*  Hives. Erythromycin *DERMATOLOGICALS*  Swelling.  Medication History Toprol XL (25MG  Tablet ER 24HR, Oral daily) Active. Hyoscyamine Sulfate (0.125MG  Tablet, Oral as needed) Active. Xanax XR (0.5MG  Tablet ER 24HR, Oral as needed) Active. Medications Reconciled  Social History Alcohol use  Occasional alcohol use. Caffeine use  Coffee. No drug use  Tobacco use  Never smoker.  Family History Arthritis  Daughter. Heart Disease  Father, Mother, Sister. Heart disease in female family member before age 67  Malignant Neoplasm Of Pancreas  Sister.  Pregnancy / Birth History Age at menarche  11 years. Age of menopause  57-50 Gravida  48 Maternal age  62-30 Para  4  Review of Systems General Present- Appetite Loss, Chills, Fatigue and Weight Loss. Not Present- Fever, Night Sweats and Weight Gain. Skin Present- Dryness. Not Present- Change in Wart/Mole, Hives, Jaundice, New Lesions, Non-Healing Wounds, Rash and Ulcer. HEENT Present- Seasonal Allergies, Sinus Pain and Wears glasses/contact lenses. Not Present- Earache, Hearing Loss, Hoarseness, Nose Bleed, Oral Ulcers, Ringing in the Ears, Sore Throat, Visual Disturbances and Yellow Eyes. Respiratory Present- Difficulty Breathing. Not Present- Bloody sputum, Chronic Cough, Snoring and Wheezing. Breast Not Present- Breast Mass, Breast Pain, Nipple Discharge and Skin Changes. Cardiovascular Present- Shortness of Breath. Not Present- Chest Pain, Difficulty Breathing Lying Down, Leg Cramps, Palpitations, Rapid Heart Rate and Swelling of Extremities. Gastrointestinal Present- Abdominal Pain, Bloating, Change in Bowel Habits, Excessive gas and Gets full quickly at meals. Not Present- Bloody Stool, Chronic diarrhea, Constipation, Difficulty Swallowing,  Hemorrhoids, Indigestion, Nausea, Rectal Pain and Vomiting. Female Genitourinary Not Present- Frequency, Nocturia, Painful Urination, Pelvic Pain and Urgency. Musculoskeletal Present- Back Pain, Joint Pain and Muscle Weakness. Not Present- Joint  Stiffness, Muscle Pain and Swelling of Extremities. Neurological Present- Numbness and Weakness. Not Present- Decreased Memory, Fainting, Headaches, Seizures, Tingling, Tremor and Trouble walking. Psychiatric Present- Anxiety. Not Present- Bipolar, Change in Sleep Pattern, Depression, Fearful and Frequent crying. Endocrine Present- Cold Intolerance and Hair Changes. Not Present- Excessive Hunger, Heat Intolerance, Hot flashes and New Diabetes. Hematology Not Present- Blood Thinners, Easy Bruising, Excessive bleeding, Gland problems, HIV and Persistent Infections.  Vitals Weight: 11.2 lb Height: 63in Body Surface Area: 0.57 m Body Mass Index: 1.98 kg/m  Temp.: 98.41F  Pulse: 73 (Regular)  BP: 122/64 (Sitting, Left Arm, Standard)  Physical Exam  General - appears comfortable, no distress; not diaphorectic  HEENT - normocephalic; sclerae clear, gaze conjugate; mucous membranes moist, dentition good; voice normal  Neck - symmetric on extension; no palpable anterior or posterior cervical adenopathy; no palpable masses in the thyroid bed  Chest - clear bilaterally without rhonchi, rales, or wheeze  Cor - regular rhythm with normal rate; no significant murmur  Abd - soft with mild distension; well-healed lower midline surgical incision without herniation; mild distention; palpable mass just to the left of midline in the mid abdomen, mildly tender, mobile  Ext - non-tender without significant edema or lymphedema  Neuro - grossly intact; mild tremor    Assessment & Plan   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 Patient presents on referral from her medical oncologist for evaluation of surgical resection of a partially obstructing transverse colon carcinoma. Patient is provided with written literature on colon surgery to review at home.  Patient is having symptoms of partial obstruction. She has anemia, likely from bleeding from the tumor. I have recommended proceeding with transverse colectomy. The exact procedure will depend on findings at the time of laparotomy. I recommend proceeding through an open abdominal incision. We will attempt to resect the tumor and do a primary anastomosis. Patient may require colostomy if there is more extensive disease than is recognized on CT scan.  We discussed the procedure, the hospital stay to be anticipated, and her postoperative recovery. We discussed the likelihood that this would not be a curative resection. We will coordinate her surgery with her medical oncologist.  The risks and benefits of the procedure have been discussed at length with the patient. The patient understands the proposed procedure, potential alternative treatments, and the course of recovery to be expected. All of the patient's questions have been answered at this time. The patient wishes to proceed with surgery.  Earnstine Regal, MD, [facility] Office: [phone]

## 2016-11-24 NOTE — Progress Notes (Signed)
Chaplain visit the result of a [place].  Brianna [name] is scheduled for surgery and wished for prayer prior to the event. She wishes divine overwatch as she is in surgery and afterwards. She is understandably apprehensive about post op therapy and fears that at the age of 74 her body may not be able to take the shock of surgery. That said she is very upbeat and states she is looking forward to "getting it over with." She states that the care she has received has given her comfort and calmed her fears. She is further heartened by staff reports of advances in cancer care over the last few years. She has a good family support system which will provide for her during any recovery time. 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  Page the chaplain should Brianna Stilling need or request further care.  Sallee Lange. Clayborne Divis, DMin, MDiv, MA Night Chaplain

## 2016-11-24 NOTE — Telephone Encounter (Signed)
Returned call to [place], [place]: Informed her that Dr. Benay Spice recommends transfusion with one unit prior to surgery. Order entered.

## 2016-11-25 ENCOUNTER — Encounter (HOSPITAL_COMMUNITY): Payer: Self-pay | Admitting: Anesthesiology

## 2016-11-25 ENCOUNTER — Encounter (HOSPITAL_COMMUNITY)
Admission: AD | Disposition: A | Discharge status: Home Health | Payer: Self-pay | Source: Ambulatory Visit | Attending: Surgery

## 2016-11-25 ENCOUNTER — Observation Stay (HOSPITAL_COMMUNITY): Payer: Medicare Other | Admitting: Anesthesiology

## 2016-11-25 DIAGNOSIS — C189 Malignant neoplasm of colon, unspecified: Secondary | ICD-10-CM | POA: Diagnosis present

## 2016-11-25 DIAGNOSIS — D638 Anemia in other chronic diseases classified elsewhere: Secondary | ICD-10-CM | POA: Diagnosis not present

## 2016-11-25 DIAGNOSIS — K660 Peritoneal adhesions (postprocedural) (postinfection): Secondary | ICD-10-CM | POA: Diagnosis present

## 2016-11-25 DIAGNOSIS — Z8249 Family history of ischemic heart disease and other diseases of the circulatory system: Secondary | ICD-10-CM | POA: Diagnosis not present

## 2016-11-25 DIAGNOSIS — D591 Other autoimmune hemolytic anemias: Secondary | ICD-10-CM

## 2016-11-25 DIAGNOSIS — I10 Essential (primary) hypertension: Secondary | ICD-10-CM | POA: Diagnosis present

## 2016-11-25 DIAGNOSIS — C184 Malignant neoplasm of transverse colon: Principal | ICD-10-CM

## 2016-11-25 DIAGNOSIS — C772 Secondary and unspecified malignant neoplasm of intra-abdominal lymph nodes: Secondary | ICD-10-CM | POA: Diagnosis present

## 2016-11-25 DIAGNOSIS — K567 Ileus, unspecified: Secondary | ICD-10-CM | POA: Diagnosis not present

## 2016-11-25 DIAGNOSIS — Z800 Family history of malignant neoplasm of digestive organs: Secondary | ICD-10-CM | POA: Diagnosis not present

## 2016-11-25 DIAGNOSIS — K219 Gastro-esophageal reflux disease without esophagitis: Secondary | ICD-10-CM | POA: Diagnosis present

## 2016-11-25 DIAGNOSIS — F419 Anxiety disorder, unspecified: Secondary | ICD-10-CM | POA: Diagnosis present

## 2016-11-25 DIAGNOSIS — M1990 Unspecified osteoarthritis, unspecified site: Secondary | ICD-10-CM | POA: Diagnosis present

## 2016-11-25 DIAGNOSIS — Z86010 Personal history of colonic polyps: Secondary | ICD-10-CM | POA: Diagnosis not present

## 2016-11-25 DIAGNOSIS — C19 Malignant neoplasm of rectosigmoid junction: Secondary | ICD-10-CM | POA: Diagnosis present

## 2016-11-25 DIAGNOSIS — E611 Iron deficiency: Secondary | ICD-10-CM | POA: Diagnosis not present

## 2016-11-25 HISTORY — PX: PARTIAL COLECTOMY: SHX5273

## 2016-11-25 LAB — CBC
HCT: 25.4 % — ABNORMAL LOW (ref 36.0–46.0)
HEMATOCRIT: 27.4 % — AB (ref 36.0–46.0)
HEMOGLOBIN: 10.2 g/dL — AB (ref 12.0–15.0)
Hemoglobin: 9 g/dL — ABNORMAL LOW (ref 12.0–15.0)
MCH: 32.7 pg (ref 26.0–34.0)
MCH: 35.7 pg — ABNORMAL HIGH (ref 26.0–34.0)
MCHC: 35.4 g/dL (ref 30.0–36.0)
MCHC: 37.2 g/dL — AB (ref 30.0–36.0)
MCV: 92.4 fL (ref 78.0–100.0)
MCV: 95.8 fL (ref 78.0–100.0)
PLATELETS: 442 10*3/uL — AB (ref 150–400)
Platelets: 509 10*3/uL — ABNORMAL HIGH (ref 150–400)
RBC: 2.75 MIL/uL — ABNORMAL LOW (ref 3.87–5.11)
RBC: 2.86 MIL/uL — ABNORMAL LOW (ref 3.87–5.11)
RDW: 16.4 % — AB (ref 11.5–15.5)
RDW: 16.7 % — ABNORMAL HIGH (ref 11.5–15.5)
WBC: 23.4 10*3/uL — ABNORMAL HIGH (ref 4.0–10.5)
WBC: 22.4 10*3/uL — ABNORMAL HIGH (ref 4.0–10.5)

## 2016-11-25 LAB — CREATININE, SERUM
Creatinine, Ser: 0.42 mg/dL — ABNORMAL LOW (ref 0.44–1.00)
GFR calc Af Amer: >60 mL/min (ref >60)
GFR calc non Af Amer: >60 mL/min (ref >60)

## 2016-11-25 LAB — SURGICAL PCR SCREEN
MRSA, PCR: NEGATIVE
STAPHYLOCOCCUS AUREUS: NEGATIVE

## 2016-11-25 SURGERY — COLECTOMY, PARTIAL
Anesthesia: General | Site: Abdomen

## 2016-11-25 MED ORDER — SUFENTANIL CITRATE 50 MCG/ML IV SOLN
INTRAVENOUS | Status: AC
  Filled 2016-11-25: qty 1

## 2016-11-25 MED ORDER — DIPHENHYDRAMINE HCL 12.5 MG/5ML PO ELIX: 12.5000 mg | ORAL_SOLUTION | Freq: Four times a day (QID) | ORAL | Status: DC | PRN

## 2016-11-25 MED ORDER — LABETALOL HCL 5 MG/ML IV SOLN
INTRAVENOUS | Status: AC
  Filled 2016-11-25: qty 4

## 2016-11-25 MED ORDER — ONDANSETRON 4 MG PO TBDP: 4.0000 mg | ORAL_TABLET | Freq: Four times a day (QID) | ORAL | Status: DC | PRN

## 2016-11-25 MED ORDER — ROCURONIUM BROMIDE 10 MG/ML (PF) SYRINGE
PREFILLED_SYRINGE | INTRAVENOUS | Status: DC | PRN
  Administered 2016-11-25: 10 mg via INTRAVENOUS
  Administered 2016-11-25: 30 mg via INTRAVENOUS

## 2016-11-25 MED ORDER — DEXAMETHASONE SODIUM PHOSPHATE 10 MG/ML IJ SOLN
INTRAMUSCULAR | Status: AC
  Filled 2016-11-25: qty 1

## 2016-11-25 MED ORDER — CHLORHEXIDINE GLUCONATE CLOTH 2 % EX PADS: 6.0000 | MEDICATED_PAD | Freq: Once | CUTANEOUS | Status: DC

## 2016-11-25 MED ORDER — DIPHENHYDRAMINE HCL 50 MG/ML IJ SOLN: 12.5000 mg | Freq: Four times a day (QID) | INTRAMUSCULAR | Status: DC | PRN

## 2016-11-25 MED ORDER — KCL IN DEXTROSE-NACL 20-5-0.45 MEQ/L-%-% IV SOLN
INTRAVENOUS | Status: DC
  Administered 2016-11-25: 17:00:00 via INTRAVENOUS
  Administered 2016-11-26: 1000 mL via INTRAVENOUS
  Filled 2016-11-25 (×2): qty 1000

## 2016-11-25 MED ORDER — ONDANSETRON HCL 4 MG/2ML IJ SOLN
INTRAMUSCULAR | Status: DC | PRN
  Administered 2016-11-25: 4 mg via INTRAVENOUS

## 2016-11-25 MED ORDER — LACTATED RINGERS IV SOLN
INTRAVENOUS | Status: DC
  Administered 2016-11-25 (×2): via INTRAVENOUS

## 2016-11-25 MED ORDER — ONDANSETRON HCL 4 MG/2ML IJ SOLN
4.0000 mg | Freq: Four times a day (QID) | INTRAMUSCULAR | Status: DC | PRN
  Administered 2016-11-25 (×2): 4 mg via INTRAVENOUS
  Filled 2016-11-25 (×2): qty 2

## 2016-11-25 MED ORDER — SODIUM CHLORIDE 0.9 % IV SOLN
1.0000 g | INTRAVENOUS | Status: AC
  Administered 2016-11-25: 1 g via INTRAVENOUS
  Filled 2016-11-25: qty 1

## 2016-11-25 MED ORDER — ALVIMOPAN 12 MG PO CAPS
12.0000 mg | ORAL_CAPSULE | Freq: Two times a day (BID) | ORAL | Status: DC
  Administered 2016-11-26 – 2016-11-28 (×6): 12 mg via ORAL
  Filled 2016-11-25 (×6): qty 1

## 2016-11-25 MED ORDER — ENOXAPARIN SODIUM 40 MG/0.4ML ~~LOC~~ SOLN
40.0000 mg | SUBCUTANEOUS | Status: DC
  Administered 2016-11-26 – 2016-11-29 (×4): 40 mg via SUBCUTANEOUS
  Filled 2016-11-25 (×4): qty 0.4

## 2016-11-25 MED ORDER — ONDANSETRON HCL 4 MG/2ML IJ SOLN: 4.0000 mg | Freq: Four times a day (QID) | INTRAMUSCULAR | Status: DC | PRN

## 2016-11-25 MED ORDER — ACETAMINOPHEN 10 MG/ML IV SOLN
1000.0000 mg | Freq: Once | INTRAVENOUS | Status: AC
  Administered 2016-11-25: 1000 mg via INTRAVENOUS

## 2016-11-25 MED ORDER — SUCCINYLCHOLINE CHLORIDE 200 MG/10ML IV SOSY
PREFILLED_SYRINGE | INTRAVENOUS | Status: AC
  Filled 2016-11-25: qty 10

## 2016-11-25 MED ORDER — LIP MEDEX EX OINT
TOPICAL_OINTMENT | CUTANEOUS | Status: AC
  Filled 2016-11-25: qty 7

## 2016-11-25 MED ORDER — FENTANYL CITRATE (PF) 100 MCG/2ML IJ SOLN
INTRAMUSCULAR | Status: AC
Start: 2016-11-25 — End: 2016-11-26
  Filled 2016-11-25: qty 2

## 2016-11-25 MED ORDER — LIDOCAINE 2% (20 MG/ML) 5 ML SYRINGE
INTRAMUSCULAR | Status: AC
  Filled 2016-11-25: qty 5

## 2016-11-25 MED ORDER — SODIUM CHLORIDE 0.9 % IJ SOLN
INTRAMUSCULAR | Status: AC
  Filled 2016-11-25: qty 10

## 2016-11-25 MED ORDER — SUFENTANIL CITRATE 50 MCG/ML IV SOLN
INTRAVENOUS | Status: DC | PRN
  Administered 2016-11-25: 5 ug via INTRAVENOUS
  Administered 2016-11-25: 10 ug via INTRAVENOUS
  Administered 2016-11-25 (×3): 5 ug via INTRAVENOUS
  Administered 2016-11-25 (×2): 10 ug via INTRAVENOUS

## 2016-11-25 MED ORDER — NALOXONE HCL 0.4 MG/ML IJ SOLN: 0.4000 mg | INTRAMUSCULAR | Status: DC | PRN

## 2016-11-25 MED ORDER — METOCLOPRAMIDE HCL 5 MG/ML IJ SOLN
INTRAMUSCULAR | Status: AC
  Filled 2016-11-25: qty 2

## 2016-11-25 MED ORDER — FENTANYL 40 MCG/ML IV SOLN
INTRAVENOUS | Status: DC
  Administered 2016-11-25: 240 ug via INTRAVENOUS
  Administered 2016-11-25: 14:00:00 via INTRAVENOUS
  Administered 2016-11-26: 40 ug via INTRAVENOUS
  Administered 2016-11-26: 70 ug via INTRAVENOUS
  Administered 2016-11-26: 40 ug via INTRAVENOUS
  Administered 2016-11-26: 70 ug via INTRAVENOUS
  Administered 2016-11-26: 80 ug via INTRAVENOUS
  Administered 2016-11-27: 21.25 mL via INTRAVENOUS
  Administered 2016-11-27 (×2): 40 ug via INTRAVENOUS
  Administered 2016-11-27: 20 ug via INTRAVENOUS
  Administered 2016-11-28: 06:00:00 via INTRAVENOUS
  Administered 2016-11-28: 20 mL via INTRAVENOUS
  Filled 2016-11-25 (×2): qty 25

## 2016-11-25 MED ORDER — ALVIMOPAN 12 MG PO CAPS
12.0000 mg | ORAL_CAPSULE | Freq: Once | ORAL | Status: AC
  Administered 2016-11-25: 12 mg via ORAL
  Filled 2016-11-25: qty 1

## 2016-11-25 MED ORDER — 0.9 % SODIUM CHLORIDE (POUR BTL) OPTIME
TOPICAL | Status: DC | PRN
  Administered 2016-11-25: 2000 mL

## 2016-11-25 MED ORDER — SODIUM CHLORIDE 0.9% FLUSH: 9.0000 mL | INTRAVENOUS | Status: DC | PRN

## 2016-11-25 MED ORDER — ACETAMINOPHEN 10 MG/ML IV SOLN
INTRAVENOUS | Status: AC
  Filled 2016-11-25: qty 100

## 2016-11-25 MED ORDER — MEPERIDINE HCL 50 MG/ML IJ SOLN: 6.2500 mg | INTRAMUSCULAR | Status: DC | PRN

## 2016-11-25 MED ORDER — LIDOCAINE 2% (20 MG/ML) 5 ML SYRINGE
INTRAMUSCULAR | Status: DC | PRN
  Administered 2016-11-25: 60 mg via INTRAVENOUS

## 2016-11-25 MED ORDER — LORAZEPAM 2 MG/ML IJ SOLN
1.0000 mg | Freq: Once | INTRAMUSCULAR | Status: AC
  Administered 2016-11-25: 1 mg via INTRAVENOUS
  Filled 2016-11-25: qty 1

## 2016-11-25 MED ORDER — ROCURONIUM BROMIDE 50 MG/5ML IV SOSY
PREFILLED_SYRINGE | INTRAVENOUS | Status: AC
  Filled 2016-11-25: qty 5

## 2016-11-25 MED ORDER — PHENYLEPHRINE HCL 10 MG/ML IJ SOLN
INTRAMUSCULAR | Status: DC | PRN
  Administered 2016-11-25: 120 ug via INTRAVENOUS
  Administered 2016-11-25 (×2): 80 ug via INTRAVENOUS

## 2016-11-25 MED ORDER — SUGAMMADEX SODIUM 200 MG/2ML IV SOLN
INTRAVENOUS | Status: AC
  Filled 2016-11-25: qty 2

## 2016-11-25 MED ORDER — ONDANSETRON HCL 4 MG/2ML IJ SOLN
INTRAMUSCULAR | Status: AC
  Filled 2016-11-25: qty 2

## 2016-11-25 MED ORDER — SUCCINYLCHOLINE CHLORIDE 200 MG/10ML IV SOSY
PREFILLED_SYRINGE | INTRAVENOUS | Status: DC | PRN
  Administered 2016-11-25: 80 mg via INTRAVENOUS

## 2016-11-25 MED ORDER — SUGAMMADEX SODIUM 200 MG/2ML IV SOLN
INTRAVENOUS | Status: DC | PRN
  Administered 2016-11-25: 120 mg via INTRAVENOUS

## 2016-11-25 MED ORDER — PHENYLEPHRINE 40 MCG/ML (10ML) SYRINGE FOR IV PUSH (FOR BLOOD PRESSURE SUPPORT)
PREFILLED_SYRINGE | INTRAVENOUS | Status: AC
  Filled 2016-11-25: qty 10

## 2016-11-25 MED ORDER — DEXAMETHASONE SODIUM PHOSPHATE 10 MG/ML IJ SOLN
INTRAMUSCULAR | Status: DC | PRN
  Administered 2016-11-25: 5 mg via INTRAVENOUS

## 2016-11-25 MED ORDER — FENTANYL CITRATE (PF) 100 MCG/2ML IJ SOLN
25.0000 ug | INTRAMUSCULAR | Status: DC | PRN
  Administered 2016-11-25 (×2): 25 ug via INTRAVENOUS

## 2016-11-25 MED ORDER — PROPOFOL 10 MG/ML IV BOLUS
INTRAVENOUS | Status: DC | PRN
  Administered 2016-11-25: 100 mg via INTRAVENOUS

## 2016-11-25 MED ORDER — LABETALOL HCL 5 MG/ML IV SOLN
INTRAVENOUS | Status: DC | PRN
  Administered 2016-11-25: 5 mg via INTRAVENOUS

## 2016-11-25 MED ORDER — SODIUM CHLORIDE 0.9 % IV SOLN
INTRAVENOUS | Status: DC
  Administered 2016-11-25 (×2): via INTRAVENOUS

## 2016-11-25 MED ORDER — METOCLOPRAMIDE HCL 5 MG/ML IJ SOLN
10.0000 mg | Freq: Once | INTRAMUSCULAR | Status: AC | PRN
  Administered 2016-11-25: 10 mg via INTRAVENOUS

## 2016-11-25 SURGICAL SUPPLY — 50 items
BLADE EXTENDED COATED 6.5IN (ELECTRODE) ×2 IMPLANT
BLADE HEX COATED 2.75 (ELECTRODE) ×4 IMPLANT
BLADE SURG SZ10 CARB STEEL (BLADE) IMPLANT
CHLORAPREP W/TINT 26ML (MISCELLANEOUS) ×2 IMPLANT
COVER MAYO STAND STRL (DRAPES) ×6 IMPLANT
COVER SURGICAL LIGHT HANDLE (MISCELLANEOUS) ×2 IMPLANT
DRAPE LAPAROSCOPIC ABDOMINAL (DRAPES) ×2 IMPLANT
DRAPE SHEET LG 3/4 BI-LAMINATE (DRAPES) ×4 IMPLANT
DRAPE UTILITY XL STRL (DRAPES) ×4 IMPLANT
DRAPE WARM FLUID 44X44 (DRAPE) ×2 IMPLANT
DRSG OPSITE POSTOP 4X10 (GAUZE/BANDAGES/DRESSINGS) ×2 IMPLANT
DRSG OPSITE POSTOP 4X6 (GAUZE/BANDAGES/DRESSINGS) IMPLANT
DRSG OPSITE POSTOP 4X8 (GAUZE/BANDAGES/DRESSINGS) IMPLANT
DRSG PAD ABDOMINAL 8X10 ST (GAUZE/BANDAGES/DRESSINGS) IMPLANT
ELECT PENCIL ROCKER SW 15FT (MISCELLANEOUS) ×2 IMPLANT
ELECT REM PT RETURN 9FT ADLT (ELECTROSURGICAL) ×2
ELECTRODE REM PT RTRN 9FT ADLT (ELECTROSURGICAL) ×1 IMPLANT
GAUZE SPONGE 4X4 12PLY STRL (GAUZE/BANDAGES/DRESSINGS) IMPLANT
GLOVE SURG ORTHO 8.0 STRL STRW (GLOVE) ×4 IMPLANT
GOWN STRL REUS W/TWL XL LVL3 (GOWN DISPOSABLE) ×12 IMPLANT
HANDLE SUCTION POOLE (INSTRUMENTS) ×1 IMPLANT
KIT BASIN OR (CUSTOM PROCEDURE TRAY) ×4 IMPLANT
LEGGING LITHOTOMY PAIR STRL (DRAPES) IMPLANT
LUBRICANT JELLY K Y 4OZ (MISCELLANEOUS) IMPLANT
PACK GENERAL/GYN (CUSTOM PROCEDURE TRAY) ×2 IMPLANT
RELOAD PROXIMATE 75MM BLUE (ENDOMECHANICALS) ×4 IMPLANT
SEALER TISSUE X1 CVD JAW (INSTRUMENTS) ×2 IMPLANT
SPONGE LAP 18X18 X RAY DECT (DISPOSABLE) ×2 IMPLANT
STAPLER GUN LINEAR PROX 60 (STAPLE) ×2 IMPLANT
STAPLER PROXIMATE 75MM BLUE (STAPLE) ×2 IMPLANT
STAPLER VISISTAT 35W (STAPLE) ×2 IMPLANT
SUCTION POOLE HANDLE (INSTRUMENTS) ×2
SUT NOV 1 T60/GS (SUTURE) IMPLANT
SUT NOVA 1 T20/GS 25DT (SUTURE) ×4 IMPLANT
SUT NOVA NAB DX-16 0-1 5-0 T12 (SUTURE) IMPLANT
SUT NOVA T20/GS 25 (SUTURE) ×4 IMPLANT
SUT PDS AB 1 TP1 96 (SUTURE) IMPLANT
SUT SILK 2 0 (SUTURE) ×2
SUT SILK 2 0 SH CR/8 (SUTURE) ×2 IMPLANT
SUT SILK 2 0SH CR/8 30 (SUTURE) IMPLANT
SUT SILK 2-0 18XBRD TIE 12 (SUTURE) ×1 IMPLANT
SUT SILK 2-0 30XBRD TIE 12 (SUTURE) ×1 IMPLANT
SUT SILK 3 0 (SUTURE) ×1
SUT SILK 3 0 SH CR/8 (SUTURE) ×6 IMPLANT
SUT SILK 3-0 18XBRD TIE 12 (SUTURE) ×1 IMPLANT
TOWEL OR 17X26 10 PK STRL BLUE (TOWEL DISPOSABLE) ×4 IMPLANT
TOWEL OR NON WOVEN STRL DISP B (DISPOSABLE) ×4 IMPLANT
TRAY FOLEY CATH 14FRSI W/METER (CATHETERS) ×2 IMPLANT
TUBING CONNECTING 10 (TUBING) IMPLANT
YANKAUER SUCT BULB TIP 10FT TU (MISCELLANEOUS) ×2 IMPLANT

## 2016-11-25 NOTE — Anesthesia Preprocedure Evaluation (Addendum)
Anesthesia Evaluation  Patient identified by MRN, date of birth, ID band Patient awake    Reviewed: Allergy & Precautions, NPO status , Patient's Chart, lab work & pertinent test results, reviewed documented beta blocker date and time   Airway Mallampati: II  TM Distance: >3 FB Neck ROM: Full    Dental no notable dental hx. (+) Caps, Teeth Intact   Pulmonary asthma , pneumonia, resolved,    Pulmonary exam normal breath sounds clear to auscultation       Cardiovascular Exercise Tolerance: Poor hypertension, Pt. on medications and Pt. on home beta blockers + Valvular Problems/Murmurs  Rhythm:Regular Rate:Tachycardia     Neuro/Psych Anxiety negative neurological ROS     GI/Hepatic Neg liver ROS, Hx/o Liver abscess   Endo/Other  Hyperlipidemia  Renal/GU negative Renal ROS  negative genitourinary   Musculoskeletal  (+) Arthritis , Compression Fx T11 osteoporosis   Abdominal   Peds  Hematology  (+) anemia , Monoclonal gammopathy of unknown significance (+) cold agglutinin Hx/o prior transfusion   Anesthesia Other Findings   Reproductive/Obstetrics                              Chemistry      Component Value Date/Time   NA 131 (L) 11/17/2016 1042   K 4.4 11/17/2016 1042   CL 99 (L) 11/11/2016 0230   CO2 27 11/17/2016 1042   BUN 13.3 11/17/2016 1042   CREATININE 0.6 11/17/2016 1042      Component Value Date/Time   CALCIUM 9.1 11/17/2016 1042   ALKPHOS 82 11/17/2016 1042   AST 16 11/17/2016 1042   ALT 15 11/17/2016 1042   BILITOT 3.50 (H) 11/17/2016 1042     Lab Results  Component Value Date   WBC 23.4 (H) 11/25/2016   HGB 9.0 (L) 11/25/2016   HCT 25.4 (L) 11/25/2016   MCV 92.4 11/25/2016   PLT 442 (H) 11/25/2016   EKG 06/20/2016: SVT rate 142/min, low voltage.  Anesthesia Physical Anesthesia Plan  ASA: III  Anesthesia Plan: General   Post-op Pain Management:     Induction: Intravenous  Airway Management Planned: Oral ETT  Additional Equipment: Arterial line  Intra-op Plan:   Post-operative Plan: Extubation in OR and Possible Post-op intubation/ventilation  Informed Consent: I have reviewed the patients History and Physical, chart, labs and discussed the procedure including the risks, benefits and alternatives for the proposed anesthesia with the patient or authorized representative who has indicated his/her understanding and acceptance.   Dental advisory given  Plan Discussed with: CRNA, Anesthesiologist and Surgeon  Anesthesia Plan Comments:         Anesthesia Quick Evaluation

## 2016-11-25 NOTE — Anesthesia Postprocedure Evaluation (Signed)
Anesthesia Post Note  Patient: Brianna [name]  Procedure(s) Performed: Procedure(s) (LRB): EXTENDED RIGHT COLECTOMY (N/A)  Patient location during evaluation: PACU Anesthesia Type: General Level of consciousness: awake and alert and oriented Pain management: pain level controlled Vital Signs Assessment: post-procedure vital signs reviewed and stable Respiratory status: spontaneous breathing, nonlabored ventilation, respiratory function stable and patient connected to nasal cannula oxygen Cardiovascular status: blood pressure returned to baseline and stable Anesthetic complications: no Comments: Had some PON treated in PACU and resolved    Last Vitals:  Vitals:   11/25/16 1345 11/25/16 1413  BP: (!) 159/82 (!) 156/70  Pulse: 97 97  Resp: 13 16  Temp:  36.4 C    Last Pain:  Vitals:   11/25/16 1323  TempSrc:   PainSc: 8                  [name] A.

## 2016-11-25 NOTE — Progress Notes (Signed)
IP PROGRESS NOTE  Subjective:   Brianna [name] was admitted yesterday for a bowel prep. She reports nausea and vomiting while taking the bowel prep last night. She feels better this morning. She was transfused with one unit of packed red cells yesterday. No fever or rash during the transfusion. No dyspnea this a.m.  Objective: Vital signs in last 24 hours: Blood pressure 130/71, pulse (!) 112, temperature 97.9 F (36.6 C), temperature source Oral, resp. rate 18, SpO2 95 %.  Intake/Output from previous day: 12/13 0701 - 12/14 0700 In: 781.7 [I.V.:411.7; Blood:370] Out: [id number] [Urine:655; Emesis/NG output:950]  Physical Exam:  HEENT: Scleral icterus Lungs: Clear bilaterally, no respiratory distress Cardiac: Regular rate and rhythm Abdomen: Soft, no mass, no hepatomegaly Extremities: No leg edema   Portacath/PICC-without erythema  Lab Results:  Recent Labs  11/24/16 1414 11/25/16 0443  WBC 20.0* 23.4*  HGB 8.5* 9.0*  HCT 23.8* 25.4*  PLT 536* 442*    BMET No results for input(s): NA, K, CL, CO2, GLUCOSE, BUN, CREATININE, CALCIUM in the last 72 hours.  Studies/Results: No results found.  Medications: I have reviewed the patient's current medications.  Assessment/Plan:  1. Transverse colon cancer-plan for surgery today 2.  Cold agglutinin disease 3.  Anemia secondary to #2 and possible contribution from the transverse colon cancer, status post red blood cell transfusions 11/20/2016 and 11/24/2016 4.  Hyperbilirubinemia secondary to hemolysis 5. Liver abscess July 2017   She is scheduled for resection of the transverse colon tumor today. We will check a CBC on 11/26/2016. She may require additional red cell transfusions while in the hospital.  Recommendations: 1. Follow-up CBC after surgery and transfuse additional red cells as indicated 2. Keep patient warm and use blood warmer for transfusions  Please call hematology as needed.   LOS: 1 day   Betsy Coder,  MD   11/25/2016, 8:11 AM

## 2016-11-25 NOTE — Progress Notes (Signed)
Patient refused NG placement. Dr Marlou Starks notified.

## 2016-11-25 NOTE — Anesthesia Procedure Notes (Signed)
Procedure Name: Intubation Date/Time: 11/25/2016 10:31 AM Performed by: Danley Danker L Patient Re-evaluated:Patient Re-evaluated prior to inductionOxygen Delivery Method: Circle system utilized Preoxygenation: Pre-oxygenation with 100% oxygen Intubation Type: IV induction Ventilation: Mask ventilation without difficulty and Oral airway inserted - appropriate to patient size Laryngoscope Size: Sabra Heck and 2 Grade View: Grade I Tube type: Oral Tube size: 7.0 mm Number of attempts: 1 Airway Equipment and Method: Stylet Placement Confirmation: ETT inserted through vocal cords under direct vision,  positive ETCO2 and breath sounds checked- equal and bilateral Secured at: 21 cm Tube secured with: Tape Dental Injury: Teeth and Oropharynx as per pre-operative assessment

## 2016-11-25 NOTE — Interval H&P Note (Signed)
History and Physical Interval Note:  11/25/2016 10:29 AM  Brianna [name]  has presented today for surgery, with the diagnosis of colon cancer.  The various methods of treatment have been discussed with the patient and family. After consideration of risks, benefits and other options for treatment, the patient has consented to   Procedure(s): TRANSVERSE COLECTOMY WITH POSSIBLE COLOSTOMY (N/A) as a surgical intervention .    The patient's history has been reviewed, patient examined, no change in status, stable for surgery.  I have reviewed the patient's chart and labs.  Questions were answered to the patient's satisfaction.    Earnstine Regal, MD, [facility] Office: [place]

## 2016-11-25 NOTE — Brief Op Note (Signed)
11/24/2016 - 11/25/2016  12:32 PM  PATIENT:  Harriet Masson  82 y.o. female  PRE-OPERATIVE DIAGNOSIS:  colon cancer  POST-OPERATIVE DIAGNOSIS:  colon cancer  PROCEDURE:  Procedure(s): EXTENDED RIGHT COLECTOMY (N/A)  SURGEON:  Surgeon(s) and Role:    * Armandina Gemma, MD - Primary  ANESTHESIA:   general  EBL:  Total I/O In: 1000 [I.V.:1000] Out: 225 [Urine:175; Blood:50]  BLOOD ADMINISTERED:none  DRAINS: none   LOCAL MEDICATIONS USED:  NONE  SPECIMEN:  Excision  DISPOSITION OF SPECIMEN:  PATHOLOGY  COUNTS:  YES  TOURNIQUET:  * No tourniquets in log *  DICTATION: .Other Dictation: Dictation Number [id number]  PLAN OF CARE: Admit to inpatient   PATIENT DISPOSITION:  PACU - hemodynamically stable.   Delay start of Pharmacological VTE agent (>24hrs) due to surgical blood loss or risk of bleeding: yes  Earnstine Regal, MD, [facility] Office: [phone]

## 2016-11-25 NOTE — Progress Notes (Signed)
Dr. Royce Macadamia in to see patient- aware of patient's heart rates

## 2016-11-25 NOTE — Transfer of Care (Signed)
Immediate Anesthesia Transfer of Care Note  Patient: Brianna [name]  Procedure(s) Performed: Procedure(s): EXTENDED RIGHT COLECTOMY (N/A)  Patient Location: PACU  Anesthesia Type:General  Level of Consciousness: awake  Airway & Oxygen Therapy: Patient Spontanous Breathing and Patient connected to nasal cannula oxygen  Post-op Assessment: Report given to RN and Post -op Vital signs reviewed and stable  Post vital signs: Reviewed and stable  Last Vitals:  Vitals:   11/25/16 0606 11/25/16 0918  BP: 130/71 (!) 150/60  Pulse: (!) 112 98  Resp: 18 18  Temp: 36.6 C 36.8 C    Last Pain:  Vitals:   11/25/16 0918  TempSrc: Oral  PainSc:          Complications: No apparent anesthesia complications

## 2016-11-26 LAB — BASIC METABOLIC PANEL
Anion gap: 4 — ABNORMAL LOW (ref 5–15)
BUN: 15 mg/dL (ref 6–20)
CHLORIDE: 98 mmol/L — AB (ref 101–111)
CO2: 26 mmol/L (ref 22–32)
CREATININE: 0.36 mg/dL — AB (ref 0.44–1.00)
Calcium: 8 mg/dL — ABNORMAL LOW (ref 8.9–10.3)
GFR calc Af Amer: >60 mL/min (ref >60)
GFR calc non Af Amer: >60 mL/min (ref >60)
Glucose, Bld: 148 mg/dL — ABNORMAL HIGH (ref 65–99)
POTASSIUM: 3.7 mmol/L (ref 3.5–5.1)
Sodium: 128 mmol/L — ABNORMAL LOW (ref 135–145)

## 2016-11-26 LAB — CBC
HEMATOCRIT: 25.1 % — AB (ref 36.0–46.0)
Hemoglobin: 8.9 g/dL — ABNORMAL LOW (ref 12.0–15.0)
MCH: 32.5 pg (ref 26.0–34.0)
MCHC: 35.5 g/dL (ref 30.0–36.0)
MCV: 91.6 fL (ref 78.0–100.0)
PLATELETS: 461 10*3/uL — AB (ref 150–400)
RBC: 2.74 MIL/uL — AB (ref 3.87–5.11)
RDW: 16.9 % — ABNORMAL HIGH (ref 11.5–15.5)
WBC: 16.9 10*3/uL — ABNORMAL HIGH (ref 4.0–10.5)

## 2016-11-26 MED ORDER — PANTOPRAZOLE SODIUM 40 MG IV SOLR
40.0000 mg | Freq: Every day | INTRAVENOUS | Status: DC
  Administered 2016-11-26: 40 mg via INTRAVENOUS
  Filled 2016-11-26: qty 40

## 2016-11-26 MED ORDER — LORAZEPAM 2 MG/ML IJ SOLN
1.0000 mg | Freq: Every evening | INTRAMUSCULAR | Status: DC | PRN
  Administered 2016-11-26 – 2016-11-27 (×2): 1 mg via INTRAVENOUS
  Filled 2016-11-26 (×2): qty 1

## 2016-11-26 MED ORDER — METOPROLOL SUCCINATE ER 25 MG PO TB24
25.0000 mg | ORAL_TABLET | Freq: Every day | ORAL | Status: DC
  Administered 2016-11-26 – 2016-11-29 (×4): 25 mg via ORAL
  Filled 2016-11-26 (×5): qty 1

## 2016-11-26 MED ORDER — TAMSULOSIN HCL 0.4 MG PO CAPS
0.4000 mg | ORAL_CAPSULE | Freq: Every day | ORAL | Status: DC
  Administered 2016-11-26 – 2016-11-29 (×4): 0.4 mg via ORAL
  Filled 2016-11-26 (×4): qty 1

## 2016-11-26 MED ORDER — KCL IN DEXTROSE-NACL 20-5-0.9 MEQ/L-%-% IV SOLN
INTRAVENOUS | Status: DC
  Administered 2016-11-26 (×2): via INTRAVENOUS
  Filled 2016-11-26 (×2): qty 1000

## 2016-11-26 NOTE — Op Note (Signed)
Brianna [name], [name] NO.:  [id number]  MEDICAL RECORD NO.:  [id number]  LOCATION:  [id number]                         FACILITY:  [facility]  PHYSICIAN:  Earnstine Regal, MD      DATE OF BIRTH:  July 08, 1924  DATE OF PROCEDURE:  11/25/2016                              OPERATIVE REPORT   PREOPERATIVE DIAGNOSIS:  Adenocarcinoma of the colon.  POSTOPERATIVE DIAGNOSIS:  Adenocarcinoma of the colon.  PROCEDURE:  Extended right colectomy with resection of proximal transverse colon tumor.  SURGEON:  Armandina Gemma, MD, FACS  ANESTHESIA:  General.  ESTIMATED BLOOD LOSS:  Minimal.  PREPARATION:  ChloraPrep.  COMPLICATIONS:  None.  INDICATIONS:  The patient is a 82 year old female, referred by Dr. Julieanne Manson for surgical evaluation of transverse colon carcinoma with partial obstruction.  The patient had presented with multiple liver abscesses in the summer of 2017.  She was treated with percutaneous drainage and antibiotics.  She had persistent abdominal pain.  CT scan on October 20, 2016, demonstrated an apple-core lesion in the mid transverse colon measuring 5.6 cm in dimension.  This appeared to be transmural disease with involvements of lymph nodes at the root of the transverse mesocolon.  The patient underwent colonoscopy and biopsies confirmed adenocarcinoma.  The patient has partial obstructive symptoms. She now comes to Surgery for resection.  BODY OF REPORT:  Procedure was done in OR #3 at the [facility].  The patient was brought to the operating room and placed in a supine position on the operating room table.  Following administration of general anesthesia, the patient was positioned and then prepped and draped in the usual aseptic fashion.  After ascertaining that an adequate level of anesthesia had been achieved, a midline abdominal incision was made with a #10 blade.  Dissection was carried into the subcutaneous tissues.  Fascia was incised  in the midline and the peritoneal cavity was entered cautiously.  There were limited adhesions to the anterior abdominal wall.  Incision was extended cephalad and caudad.  Exploration revealed a large tumor in the proximal to mid transverse colon.  This measured at least 5-8 cm in greatest dimension.  There was nodal involvement extending down into the root of the transverse mesocolon and involving the named vessels within this region.  There were adhesions to the cecum and terminal ileum consistent with previous appendectomy.  Adhesions to the right colon were taken down.  After evaluating the location of the tumor within the colon, a decision was made to proceed with extended right colectomy so as to remove the entire right colon and the proximal half of the transverse colon.  Therefore, the right colon was mobilized from its lateral peritoneal attachments.  Right colon was completely mobilized.  Terminal ileum was transected with a GIA stapler. Mesentery was divided with the Ethicon EnSeal.  Dissection was carried up the right colon to the hepatic flexure.  Hepatic flexure was completely taken down by dividing the peritoneal attachments with the EnSeal.  Dissection was carried into the proximal transverse colon.  Next, we found the point distal to the tumor in the mid to distal transverse colon.  This was at least 10 cm past  the tumor.  A point in the transverse colon was selected and transected with a GIA stapler. The gastrocolic ligament was resected with the tumor.  There appeared to be metastatic disease within the gastrocolic ligament.  Gastrocolic ligament was dissected off the greater curvature of the stomach and divided with the EnSeal.  The remaining mesentery was then carefully dissected out.  Larger vessels were divided between Kelly clamps with the EnSeal and then suture ligated with 2-0 silk suture ligatures. Dissection was carried across the transverse mesocolon in this  fashion until the entire specimen was excised.  Suture was used to mark the distal margin.  The entire right colon and proximal transverse colon with tumor were sent to Pathology for review.  Good hemostasis was noted.  Next, a side-to-side functionally end-to-end anastomosis was created between the terminal ileum and the mid transverse colon.  This was performed with the GIA stapler.  Staple line was inspected for hemostasis.  Enterotomy was closed with a TA-60 stapler.  At this point, gown and gloves were changed.  The field was reset. Abdomen was irrigated with warm saline which was evacuated.  Mesenteric defect was closed with interrupted 2-0 and 3-0 silk sutures.  Good hemostasis was noted throughout the wound.  The abdomen was again irrigated with warm saline which was evacuated.  Midline abdominal incision was then closed with interrupted #1 Novafil simple sutures with the knots buried.  The subcutaneous tissues were irrigated and hemostasis achieved with the electrocautery.  Skin was closed with stainless steel staples.  Wound was washed and dried and a honeycomb dressing was applied.  The patient was awakened from anesthesia and brought to the recovery room.  The patient tolerated the procedure well.   Earnstine Regal, MD, [facility] Office: [phone]    TMG/MEDQ  D:  11/25/2016  T:  11/26/2016  Job:  [id number]  cc:   Docia Chuck. Henrene Pastor, MD  Dr. Gala Lewandowsky office  Kathyrn Lass, M.D. Fax: [phone]

## 2016-11-26 NOTE — Progress Notes (Signed)
The hemoglobin is stable.  Recommendations:  Check CBC 11/28/2016 and transfuse packed red blood cells for a hemoglobin of less than 8. Use a blood warmer for transfusions.  Please call Hematology as needed. She is scheduled for an office visit at the Cancer center 12/09/2016.

## 2016-11-26 NOTE — Progress Notes (Signed)
General Surgery [facility]  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 Benay Spice following        Earnstine Regal, MD, [facility]       Office: [phone]    Subjective: Patient in bed, family at bedside.  Mild pain.  Denies nausea - wants to drink.  Requests Ativan for sleep prn.  Objective: Vital signs in last 24 hours: Temp:  [97.5 F (36.4 C)-98.7 F (37.1 C)] 97.7 F (36.5 C) (12/15 1047) Pulse Rate:  [84-108] 106 (12/15 1047) Resp:  [10-20] 14 (12/15 1047) BP: (145-217)/(63-109) 151/69 (12/15 1047) SpO2:  [94 %-100 %] 100 % (12/15 1047) Arterial Line BP: (148-165)/(39-52) 154/47 (12/14 1323) Last BM Date: 11/24/16  Intake/Output from previous day: 12/14 0701 - 12/15 0700 In: 3226.3 [I.V.:3176.3; IV Piggyback:50] Out: [id number] [Urine:1595; Blood:50] Intake/Output this shift: Total I/O In: 300 [I.V.:300] Out: -   Physical Exam: HEENT - sclerae clear, mucous membranes moist Neck - soft Chest - clear bilaterally Cor - RRR Abdomen - soft without distension; midline incision dry and intact; active BS present Ext - no edema, non-tender Neuro - alert & oriented, no focal deficits  Lab Results:   Recent Labs  11/25/16 1458 11/26/16 0532  WBC 22.4* 16.9*  HGB 10.2* 8.9*  HCT 27.4* 25.1*  PLT 509* 461*   BMET  Recent Labs  11/25/16 1458 11/26/16 0532  NA  --  128*  K  --  3.7  CL  --  98*  CO2  --  26  GLUCOSE  --  148*  BUN  --  15  CREATININE 0.42* 0.36*  CALCIUM  --  8.0*   PT/INR No results for input(s): LABPROT, INR in the last 72 hours. Comprehensive Metabolic Panel:    Component Value Date/Time   NA 128 (L) 11/26/2016 0532   NA 131  (L) 11/17/2016 1042   NA 132 (L) 11/11/2016 0230   NA 131 (L) 10/21/2016 1454   K 3.7 11/26/2016 0532   K 4.4 11/17/2016 1042   K 4.0 11/11/2016 0230   K 4.0 10/21/2016 1454   CL 98 (L) 11/26/2016 0532   CL 99 (L) 11/11/2016 0230   CO2 26 11/26/2016 0532   CO2 27 11/17/2016 1042   CO2 24 11/11/2016 0230   CO2 28 10/21/2016 1454   BUN 15 11/26/2016 0532   BUN 13.3 11/17/2016 1042   BUN 13 11/11/2016 0230   BUN 13.6 10/21/2016 1454   CREATININE 0.36 (L) 11/26/2016 0532   CREATININE 0.42 (L) 11/25/2016 1458   CREATININE 0.6 11/17/2016 1042   CREATININE 0.7 10/21/2016 1454   GLUCOSE 148 (H) 11/26/2016 0532   GLUCOSE 101 11/17/2016 1042   GLUCOSE 108 (H) 11/11/2016 0230   GLUCOSE 118 10/21/2016 1454   CALCIUM 8.0 (L) 11/26/2016 0532   CALCIUM 9.1 11/17/2016 1042   CALCIUM 8.4 (L) 11/11/2016 0230   CALCIUM 9.3 10/21/2016 1454   AST 16 11/17/2016 1042   AST 13 10/21/2016 1454   ALT 15 11/17/2016 1042   ALT 8 10/21/2016 1454   ALKPHOS 82 11/17/2016 1042   ALKPHOS 74 10/21/2016 1454   BILITOT 3.50 (H) 11/17/2016  1042   BILITOT 2.43 (H) 10/21/2016 1454   PROT 7.0 11/17/2016 1042   PROT 6.9 10/21/2016 1454   ALBUMIN 3.2 (L) 11/17/2016 1042   ALBUMIN 3.3 (L) 10/21/2016 1454    Studies/Results: No results found.    Dexton Zwilling M 11/26/2016  Patient ID: Brianna [name], female   DOB: [birth year], 82 y.o.   MRN: [id number]

## 2016-11-27 ENCOUNTER — Encounter (HOSPITAL_COMMUNITY): Payer: Self-pay | Admitting: Surgery

## 2016-11-27 DIAGNOSIS — I10 Essential (primary) hypertension: Secondary | ICD-10-CM | POA: Diagnosis present

## 2016-11-27 DIAGNOSIS — F419 Anxiety disorder, unspecified: Secondary | ICD-10-CM | POA: Diagnosis present

## 2016-11-27 LAB — TYPE AND SCREEN
ABO/RH(D): A POS
Antibody Screen: POSITIVE
DAT, IGG: NEGATIVE
UNIT DIVISION: 0
UNIT DIVISION: 0

## 2016-11-27 MED ORDER — VITAMIN C 500 MG PO TABS
500.0000 mg | ORAL_TABLET | Freq: Two times a day (BID) | ORAL | Status: DC
  Administered 2016-11-27 – 2016-11-29 (×5): 500 mg via ORAL
  Filled 2016-11-27 (×5): qty 1

## 2016-11-27 MED ORDER — LACTATED RINGERS IV BOLUS (SEPSIS): 1000.0000 mL | Freq: Three times a day (TID) | INTRAVENOUS | Status: AC | PRN

## 2016-11-27 MED ORDER — DIPHENHYDRAMINE HCL 25 MG PO CAPS: 25.0000 mg | ORAL_CAPSULE | Freq: Four times a day (QID) | ORAL | Status: DC | PRN

## 2016-11-27 MED ORDER — ADULT MULTIVITAMIN W/MINERALS CH
1.0000 | ORAL_TABLET | Freq: Every day | ORAL | Status: DC
  Administered 2016-11-27 – 2016-11-29 (×3): 1 via ORAL
  Filled 2016-11-27 (×5): qty 1

## 2016-11-27 MED ORDER — SODIUM CHLORIDE 0.9% FLUSH: 3.0000 mL | INTRAVENOUS | Status: DC | PRN

## 2016-11-27 MED ORDER — ALUM & MAG HYDROXIDE-SIMETH 200-200-20 MG/5ML PO SUSP
30.0000 mL | Freq: Four times a day (QID) | ORAL | Status: DC | PRN
  Administered 2016-11-27 – 2016-11-29 (×2): 30 mL via ORAL
  Filled 2016-11-27 (×2): qty 30

## 2016-11-27 MED ORDER — DIPHENHYDRAMINE HCL 50 MG/ML IJ SOLN: 12.5000 mg | Freq: Four times a day (QID) | INTRAMUSCULAR | Status: DC | PRN

## 2016-11-27 MED ORDER — ENSURE ENLIVE PO LIQD
237.0000 mL | Freq: Two times a day (BID) | ORAL | Status: DC
  Administered 2016-11-29: 237 mL via ORAL

## 2016-11-27 MED ORDER — SODIUM CHLORIDE 0.9% FLUSH
3.0000 mL | Freq: Two times a day (BID) | INTRAVENOUS | Status: DC
  Administered 2016-11-28 – 2016-11-29 (×3): 3 mL via INTRAVENOUS

## 2016-11-27 MED ORDER — HYDRALAZINE HCL 20 MG/ML IJ SOLN: 5.0000 mg | Freq: Four times a day (QID) | INTRAMUSCULAR | Status: DC | PRN

## 2016-11-27 MED ORDER — METOCLOPRAMIDE HCL 5 MG/ML IJ SOLN: 5.0000 mg | Freq: Four times a day (QID) | INTRAMUSCULAR | Status: DC | PRN

## 2016-11-27 MED ORDER — SACCHAROMYCES BOULARDII 250 MG PO CAPS
250.0000 mg | ORAL_CAPSULE | Freq: Two times a day (BID) | ORAL | Status: DC
  Administered 2016-11-27 – 2016-11-28 (×3): 250 mg via ORAL
  Filled 2016-11-27 (×5): qty 1

## 2016-11-27 MED ORDER — ALPRAZOLAM 0.5 MG PO TABS
0.5000 mg | ORAL_TABLET | Freq: Every evening | ORAL | Status: DC | PRN
  Administered 2016-11-27 – 2016-11-28 (×2): 0.5 mg via ORAL
  Filled 2016-11-27 (×2): qty 1

## 2016-11-27 MED ORDER — ACETAMINOPHEN 500 MG PO TABS
1000.0000 mg | ORAL_TABLET | Freq: Three times a day (TID) | ORAL | Status: DC
  Administered 2016-11-27: 1000 mg via ORAL
  Filled 2016-11-27 (×2): qty 2

## 2016-11-27 MED ORDER — MAGIC MOUTHWASH
15.0000 mL | Freq: Four times a day (QID) | ORAL | Status: DC | PRN
  Filled 2016-11-27: qty 15

## 2016-11-27 MED ORDER — PHENOL 1.4 % MT LIQD
2.0000 | OROMUCOSAL | Status: DC | PRN
  Filled 2016-11-27: qty 177

## 2016-11-27 MED ORDER — SODIUM CHLORIDE 0.9 % IV SOLN: 250.0000 mL | INTRAVENOUS | Status: DC | PRN

## 2016-11-27 MED ORDER — METHOCARBAMOL 1000 MG/10ML IJ SOLN
1000.0000 mg | Freq: Four times a day (QID) | INTRAVENOUS | Status: DC | PRN
  Filled 2016-11-27: qty 10

## 2016-11-27 MED ORDER — METOPROLOL TARTRATE 5 MG/5ML IV SOLN: 5.0000 mg | Freq: Four times a day (QID) | INTRAVENOUS | Status: DC | PRN

## 2016-11-27 MED ORDER — METHOCARBAMOL 500 MG PO TABS: 1000.0000 mg | ORAL_TABLET | Freq: Four times a day (QID) | ORAL | Status: DC | PRN

## 2016-11-27 MED ORDER — MENTHOL 3 MG MT LOZG: 1.0000 | LOZENGE | OROMUCOSAL | Status: DC | PRN

## 2016-11-27 MED ORDER — POLYVINYL ALCOHOL 1.4 % OP SOLN
Freq: Three times a day (TID) | OPHTHALMIC | Status: DC | PRN
  Filled 2016-11-27: qty 15

## 2016-11-27 MED ORDER — LIP MEDEX EX OINT
1.0000 "application " | TOPICAL_OINTMENT | Freq: Two times a day (BID) | CUTANEOUS | Status: DC
  Administered 2016-11-27 – 2016-11-29 (×3): 1 via TOPICAL
  Filled 2016-11-27 (×2): qty 7

## 2016-11-27 NOTE — Progress Notes (Signed)
Chaplain visit is a follow-up to earlier encounters. Brianna [name] is dealing with many stress issues in her life. She is newly to [place] from [place], and has spent a great deal of her time hospitalized. She hopes to recover from her surgery and return to a more active life. Her fear of cancer recurrence is a primary stress point, but she is determined not to allow the stress of this to be the key motivator in her life. By all accounts and assessments Brianna [name] is a self-motivating person. She is deeply religious and uses her faith as the lens for how she deals with life. She has good family support at present. A daughter from [place] is attending to her needs through an extended stay.  Page the chaplain immediately if Brianna [name] needs or requests spiritual or emotional care.  Sallee Lange. Julien Berryman, [place]

## 2016-11-27 NOTE — Progress Notes (Signed)
[facility]  [address] [place], [place] [address] Phone: [phone] FAX: [phone]   Brianna [name] [id number] Jun 10, 1924    Problem List:   Active Problems:   Carcinoma of transverse colon ([place])   Adenocarcinoma, colon ([place])   2 Days Post-Op  11/25/2016  POSTOPERATIVE DIAGNOSIS:  Adenocarcinoma of the colon.  PROCEDURE:  Extended right colectomy with resection of proximal transverse colon tumor.  SURGEON:  Armandina Gemma, MD, FACS   Assessment  OK  Plan:  -f/u pathology -pain control -adv diet gradually -HTN control -anxiolysis -VTE prophylaxis- SCDs, etc -mobilize as tolerated to help recovery.  PT/OT evals for possible d/c needs  Adin Hector, M.D., F.A.C.S. Gastrointestinal and Minimally Invasive Surgery [facility] [address] [place], [place] [address] [phone] Main / Paging   11/27/2016  CARE TEAM:  PCP: Tawanna Solo, MD  Outpatient Care Team: Patient Care Team: Kathyrn Lass, MD as PCP - General (Family Medicine) Dorna Leitz, MD as Consulting Physician (Orthopedic Surgery) Tania Ade, RN as Registered Nurse  Inpatient Treatment Team: Treatment Team: Attending Provider: Armandina Gemma, MD; Technician: Leda Quail, NT; Consulting Physician: Ladell Pier, MD; Technician: Tenna Child, NT; Registered Nurse: Mortimer Fries, RN  Subjective:  Pain controlled No nausea Chaplain at bedside  Objective:  Vital signs:  Vitals:   11/27/16 0100 11/27/16 0400 11/27/16 0502 11/27/16 0800  BP: (!) 167/78  (!) 156/69   Pulse: (!) 101  91   Resp: (!) _0 Temp: 98.1 F (36.7 C)  98.3 F (36.8 C)   TempSrc: Oral  Oral   SpO2: 99% 94% 98% 98%  Weight:      Height:        Last BM Date: 11/24/16  Intake/Output   Yesterday:  12/15 0701 - 12/16 0700 In: 1236.3 [P.O.:360; I.V.:876.3] Out: 1625  [Urine:1625] This shift:  No intake/output data recorded.  Bowel function:  Flatus: No  BM:  No  Drain: (No drain)   Physical Exam:  General: Pt awake/alert/oriented x4 in No acute distress Eyes: PERRL, normal EOM.  Sclera clear.  No icterus Neuro: CN II-XII intact w/o focal sensory/motor deficits. Lymph: No head/neck/groin lymphadenopathy Psych:  No delerium/psychosis/paranoia HENT: Normocephalic, Mucus membranes moist.  No thrush Neck: Supple, No tracheal deviation Chest: No chest wall pain w good excursion CV:  Pulses intact.  Regular rhythm MS: Normal AROM mjr joints.  No obvious deformity Abdomen: Soft.  Mildy distended.  Mildly tender at incisions only.  No evidence of peritonitis.  No incarcerated hernias. Ext:  SCDs BLE.  No mjr edema.  No cyanosis Skin: No petechiae / purpura  Results:   Labs: Results for orders placed or performed during the hospital encounter of 11/24/16 (from the past 48 hour(s))  CBC     Status: Abnormal   Collection Time: 11/25/16  2:58 PM  Result Value Ref Range   WBC 22.4 (H) 4.0 - 10.5 K/uL   RBC 2.86 (L) 3.87 - 5.11 MIL/uL   Hemoglobin 10.2 (L) 12.0 - 15.0 g/dL   HCT 27.4 (L) 36.0 - 46.0 %   MCV 95.8 78.0 - 100.0 fL   MCH 35.7 (H) 26.0 - 34.0 pg   MCHC 37.2 (H) 30.0 - 36.0 g/dL    Comment: PRE-WARMING TECHNIQUE USED 37 C RESULTS;POSSIBLE COLD AGGLUTININS    RDW 16.7 (H) 11.5 - 15.5 %   Platelets 509 (H) 150 - 400 K/uL  Creatinine, serum     Status: Abnormal   Collection Time: 11/25/16  2:58 PM  Result Value Ref Range   Creatinine, Ser 0.42 (L) 0.44 - 1.00 mg/dL   GFR calc non Af Amer >60 >60 mL/min   GFR calc Af Amer >60 >60 mL/min    Comment: (NOTE) The eGFR has been calculated using the CKD EPI equation. This calculation has not been validated in all clinical situations. eGFR's persistently <60 mL/min signify possible Chronic Kidney Disease.   Basic metabolic panel     Status: Abnormal   Collection Time: 11/26/16  5:32 AM   Result Value Ref Range   Sodium 128 (L) 135 - 145 mmol/L   Potassium 3.7 3.5 - 5.1 mmol/L   Chloride 98 (L) 101 - 111 mmol/L   CO2 26 22 - 32 mmol/L   Glucose, Bld 148 (H) 65 - 99 mg/dL   BUN 15 6 - 20 mg/dL   Creatinine, Ser 0.36 (L) 0.44 - 1.00 mg/dL   Calcium 8.0 (L) 8.9 - 10.3 mg/dL   GFR calc non Af Amer >60 >60 mL/min   GFR calc Af Amer >60 >60 mL/min    Comment: (NOTE) The eGFR has been calculated using the CKD EPI equation. This calculation has not been validated in all clinical situations. eGFR's persistently <60 mL/min signify possible Chronic Kidney Disease.    Anion gap 4 (L) 5 - 15  CBC     Status: Abnormal   Collection Time: 11/26/16  5:32 AM  Result Value Ref Range   WBC 16.9 (H) 4.0 - 10.5 K/uL   RBC 2.74 (L) 3.87 - 5.11 MIL/uL   Hemoglobin 8.9 (L) 12.0 - 15.0 g/dL   HCT 25.1 (L) 36.0 - 46.0 %   MCV 91.6 78.0 - 100.0 fL   MCH 32.5 26.0 - 34.0 pg   MCHC 35.5 30.0 - 36.0 g/dL    Comment: PRE-WARMING TECHNIQUE USED   RDW 16.9 (H) 11.5 - 15.5 %   Platelets 461 (H) 150 - 400 K/uL    Imaging / Studies: No results found.  Medications / Allergies: per chart  Antibiotics: Anti-infectives    Start     Dose/Rate Route Frequency Ordered Stop   11/25/16 1000  ertapenem (INVANZ) 1 g in sodium chloride 0.9 % 50 mL IVPB  Status:  Discontinued     1 g 100 mL/hr over 30 Minutes Intravenous  Once 11/24/16 2006 11/25/16 1420   11/25/16 0928  ertapenem (INVANZ) 1 g in sodium chloride 0.9 % 50 mL IVPB     1 g 100 mL/hr over 30 Minutes Intravenous On call to O.R. 11/25/16 0928 11/25/16 1035   11/24/16 1500  neomycin (MYCIFRADIN) tablet 1,000 mg     1,000 mg Oral Every 8 hours 11/24/16 1300 11/24/16 2110   11/24/16 1500  metroNIDAZOLE (FLAGYL) tablet 1,000 mg     1,000 mg Oral Every 8 hours 11/24/16 1300 11/24/16 2110        Note: Portions of this report may have been transcribed using voice recognition software. Every effort was made to ensure accuracy; however,  inadvertent computerized transcription errors may be present.   Any transcriptional errors that result from this process are unintentional.     Adin Hector, M.D., F.A.C.S. Gastrointestinal and Minimally Invasive Surgery [facility] [address] [place], [place] [address] [phone] Main / Paging   11/27/2016

## 2016-11-28 LAB — CBC
HCT: 22.5 % — ABNORMAL LOW (ref 36.0–46.0)
HEMOGLOBIN: 8 g/dL — AB (ref 12.0–15.0)
MCH: 32.4 pg (ref 26.0–34.0)
MCHC: 35.6 g/dL (ref 30.0–36.0)
MCV: 91.1 fL (ref 78.0–100.0)
Platelets: 482 10*3/uL — ABNORMAL HIGH (ref 150–400)
RBC: 2.47 MIL/uL — AB (ref 3.87–5.11)
RDW: 16.4 % — ABNORMAL HIGH (ref 11.5–15.5)
WBC: 12.8 10*3/uL — ABNORMAL HIGH (ref 4.0–10.5)

## 2016-11-28 LAB — CREATININE, SERUM: Creatinine, Ser: 0.35 mg/dL — ABNORMAL LOW (ref 0.44–1.00)

## 2016-11-28 LAB — MAGNESIUM: Magnesium: 1.9 mg/dL (ref 1.7–2.4)

## 2016-11-28 LAB — POTASSIUM: Potassium: 3.6 mmol/L (ref 3.5–5.1)

## 2016-11-28 MED ORDER — FENTANYL CITRATE (PF) 100 MCG/2ML IJ SOLN: 25.0000 ug | INTRAMUSCULAR | Status: DC | PRN

## 2016-11-28 MED ORDER — POLYETHYLENE GLYCOL 3350 17 G PO PACK: 17.0000 g | PACK | Freq: Two times a day (BID) | ORAL | Status: DC | PRN

## 2016-11-28 MED ORDER — POLYETHYLENE GLYCOL 3350 17 G PO PACK
17.0000 g | PACK | Freq: Every day | ORAL | Status: DC
Start: 2016-11-28 — End: 2016-11-29
  Administered 2016-11-28 – 2016-11-29 (×2): 17 g via ORAL
  Filled 2016-11-28 (×2): qty 1

## 2016-11-28 MED ORDER — POTASSIUM CHLORIDE CRYS ER 20 MEQ PO TBCR
40.0000 meq | EXTENDED_RELEASE_TABLET | Freq: Every day | ORAL | Status: DC
  Administered 2016-11-28 – 2016-11-29 (×2): 40 meq via ORAL
  Filled 2016-11-28 (×2): qty 2

## 2016-11-28 MED ORDER — ACETAMINOPHEN 500 MG PO TABS
1000.0000 mg | ORAL_TABLET | Freq: Two times a day (BID) | ORAL | Status: DC
  Administered 2016-11-28 – 2016-11-29 (×2): 1000 mg via ORAL
  Filled 2016-11-28 (×3): qty 2

## 2016-11-28 MED ORDER — HYDROCODONE-ACETAMINOPHEN 5-325 MG PO TABS: 1.0000 | ORAL_TABLET | ORAL | Status: DC | PRN

## 2016-11-28 NOTE — Progress Notes (Signed)
[facility]  [address] [place], [place] [address] Phone: [phone] FAX: [phone]   Brianna [name] [id number] 08-31-1924    Problem List:   Principal Problem:   Carcinoma of transverse colon s/p extended right colectomy 12/12/17/2015 Active Problems:   Hypertension   Anxiety   3 Days Post-Op  11/25/2016  POSTOPERATIVE DIAGNOSIS:  Adenocarcinoma of the colon.  PROCEDURE:  Extended right colectomy with resection of proximal transverse colon tumor.  SURGEON:  Armandina Gemma, MD, FACS   Assessment  Ileus resolving  Plan:  -f/u pathology -pain control -adv diet to solids -HTN control -anxiolysis -VTE prophylaxis- SCDs, etc -mobilize as tolerated to help recovery.  PT/OT evals for possible d/c needs  D/C patient from hospital when patient meets criteria (anticipate in 1-2 day(s)):  Tolerating oral intake well Ambulating well Adequate pain control without IV medications Urinating  Having flatus Disposition planning in place  I updated the patient's status to the patient.  Recommendations were made.  Questions were answered.  She expressed understanding & appreciation.    Adin Hector, M.D., F.A.C.S. Gastrointestinal and Minimally Invasive Surgery [facility] [address] [place], [place] [address] [phone] Main / Paging   11/28/2016  CARE TEAM:  PCP: Tawanna Solo, MD  Outpatient Care Team: Patient Care Team: Kathyrn Lass, MD as PCP - General (Family Medicine) Dorna Leitz, MD as Consulting Physician (Orthopedic Surgery) Tania Ade, RN as Registered Nurse  Inpatient Treatment Team: Treatment Team: Attending Provider: Armandina Gemma, MD; Technician: Leda Quail, NT; Consulting Physician: Ladell Pier, MD; Technician: Tenna Child, NT; Registered Nurse: Mortimer Fries, RN; Registered Nurse: Bailey Mech, RN; Registered  Nurse: Thermon Leyland, RN; Consulting Physician: Volanda Napoleon, MD; Physical Therapist: Alphonzo Severance, PT  Subjective:  Pain controlled - not using PCA much No nausea Tol fulls  Objective:  Vital signs:  Vitals:   11/27/16 2122 11/28/16 0000 11/28/16 0141 11/28/16 0549  BP: (!) 146/72  (!) 126/57 (!) 143/68  Pulse: (!) 120  92 93  Resp: (!) _0 Temp: 98.3 F (36.8 C)  98.8 F (37.1 C) 97.7 F (36.5 C)  TempSrc: Oral  Oral Oral  SpO2: 94% 98% 98% 97%  Weight:      Height:        Last BM Date: 11/24/16  Intake/Output   Yesterday:  12/16 0701 - 12/17 0700 In: 5170 [P.O.:1070; I.V.:375] Out: 2510 [Urine:2510] This shift:  No intake/output data recorded.  Bowel function:  Flatus: No  BM:  No  Drain: (No drain)   Physical Exam:  General: Pt awake/alert/oriented x4 in No acute distress Eyes: PERRL, normal EOM.  Sclera clear.  No icterus Neuro: CN II-XII intact w/o focal sensory/motor deficits. Lymph: No head/neck/groin lymphadenopathy Psych:  No delerium/psychosis/paranoia HENT: Normocephalic, Mucus membranes moist.  No thrush Neck: Supple, No tracheal deviation Chest: No chest wall pain w good excursion CV:  Pulses intact.  Regular rhythm MS: Normal AROM mjr joints.  No obvious deformity Abdomen: Soft.  Mildy distended.  Mildly tender at incisions only.  No evidence of peritonitis.  No incarcerated hernias. Ext:  SCDs BLE.  No mjr edema.  No cyanosis Skin: No petechiae / purpura  Results:   Labs: Results for orders placed or performed during the hospital encounter of 11/24/16 (from the past 48 hour(s))  Potassium     Status: None   Collection Time: 11/28/16  6:22  AM  Result Value Ref Range   Potassium 3.6 3.5 - 5.1 mmol/L  Magnesium     Status: None   Collection Time: 11/28/16  6:22 AM  Result Value Ref Range   Magnesium 1.9 1.7 - 2.4 mg/dL  Creatinine, serum     Status: Abnormal   Collection Time: 11/28/16  6:22 AM  Result Value Ref  Range   Creatinine, Ser 0.35 (L) 0.44 - 1.00 mg/dL   GFR calc non Af Amer >60 >60 mL/min   GFR calc Af Amer >60 >60 mL/min    Comment: (NOTE) The eGFR has been calculated using the CKD EPI equation. This calculation has not been validated in all clinical situations. eGFR's persistently <60 mL/min signify possible Chronic Kidney Disease.   CBC     Status: Abnormal   Collection Time: 11/28/16  6:22 AM  Result Value Ref Range   WBC 12.8 (H) 4.0 - 10.5 K/uL   RBC 2.47 (L) 3.87 - 5.11 MIL/uL   Hemoglobin 8.0 (L) 12.0 - 15.0 g/dL   HCT 22.5 (L) 36.0 - 46.0 %   MCV 91.1 78.0 - 100.0 fL   MCH 32.4 26.0 - 34.0 pg   MCHC 35.6 30.0 - 36.0 g/dL   RDW 16.4 (H) 11.5 - 15.5 %   Platelets 482 (H) 150 - 400 K/uL    Imaging / Studies: No results found.  Medications / Allergies: per chart  Antibiotics: Anti-infectives    Start     Dose/Rate Route Frequency Ordered Stop   11/25/16 1000  ertapenem (INVANZ) 1 g in sodium chloride 0.9 % 50 mL IVPB  Status:  Discontinued     1 g 100 mL/hr over 30 Minutes Intravenous  Once 11/24/16 2006 11/25/16 1420   11/25/16 0928  ertapenem (INVANZ) 1 g in sodium chloride 0.9 % 50 mL IVPB     1 g 100 mL/hr over 30 Minutes Intravenous On call to O.R. 11/25/16 0928 11/25/16 1035   11/24/16 1500  neomycin (MYCIFRADIN) tablet 1,000 mg     1,000 mg Oral Every 8 hours 11/24/16 1300 11/24/16 2110   11/24/16 1500  metroNIDAZOLE (FLAGYL) tablet 1,000 mg     1,000 mg Oral Every 8 hours 11/24/16 1300 11/24/16 2110        Note: Portions of this report may have been transcribed using voice recognition software. Every effort was made to ensure accuracy; however, inadvertent computerized transcription errors may be present.   Any transcriptional errors that result from this process are unintentional.     Adin Hector, M.D., F.A.C.S. Gastrointestinal and Minimally Invasive Surgery [facility] [address] [place], [place]  [address] [phone] Main / Paging   11/28/2016

## 2016-11-28 NOTE — Progress Notes (Signed)
Pt ambulating around the unit with daughter,tolerated well.

## 2016-11-28 NOTE — Progress Notes (Signed)
IP PROGRESS NOTE  Subjective:   Brianna [name]  reports improvement in pain. She had a bowel movement. She denies dyspnea and does not feel like she needs a red cell transfusion today. She reports ambulating in the hall.  Objective: Vital signs in last 24 hours: Blood pressure (!) 143/68, pulse 93, temperature 97.7 F (36.5 C), temperature source Oral, resp. rate 16, height 5\' 3"  (1.6 m), weight 110 lb (49.9 kg), SpO2 97 %.  Intake/Output from previous day: 12/16 0701 - 12/17 0700 In: [id number] [P.O.:1070; I.V.:375] Out: 2510 [Urine:2510]  Physical Exam:   Lungs: Clear bilaterally, no respiratory distress Cardiac: Regular rate and rhythm Abdomen: Soft, midline incision appears dry Extremities: No leg edema    Lab Results:  Recent Labs  11/26/16 0532 11/28/16 0622  WBC 16.9* 12.8*  HGB 8.9* 8.0*  HCT 25.1* 22.5*  PLT 461* 482*    BMET  Recent Labs  11/26/16 0532 11/28/16 0622  NA 128*  --   K 3.7 3.6  CL 98*  --   CO2 26  --   GLUCOSE 148*  --   BUN 15  --   CREATININE 0.36* 0.35*  CALCIUM 8.0*  --     Studies/Results: No results found.  Medications: I have reviewed the patient's current medications.  Assessment/Plan:  1. 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  Cold agglutinin disease 3.  Anemia secondary to #2 and possible contribution from the transverse colon cancer, status post red blood cell transfusions 11/20/2016 and 11/24/2016 4.  Hyperbilirubinemia secondary to hemolysis 5. Liver abscess July 2017   She appears to be doing well following the colon surgery. The hemoglobin is slightly lower today, but she does not report symptoms related to anemia. We will hold on a Red cell transfusion today.  Recommendations: 1. Check CBC 11/29/2016 and transfuse packed red blood cells for a hemoglobin of less than 8 2. Keep patient warm and use blood warmer for transfusions  I will be out starting  11/29/2016. I will ask Dr.Ennever to check on her. Outpatient follow-up as scheduled at the [facility] on 12/09/2016.   LOS: 4 days   Betsy Coder, MD   11/28/2016, 8:39 AM

## 2016-11-28 NOTE — Progress Notes (Signed)
PT Cancellation Note  Patient Details Name: Brianna [name] MRN: [id number] DOB: 1924/05/04   Cancelled Treatment:    Reason Eval/Treat Not Completed: Pain limiting ability to participate. Attempted PT eval this a.m.-pt declined to participate at this time. Will check back as schedule allows.    Weston Anna, MPT Pager: [phone]

## 2016-11-28 NOTE — Evaluation (Signed)
Physical Therapy Evaluation Patient Details Name: Brianna [name] MRN: [id number] DOB: 20-Jan-1924 Today's Date: 11/28/2016   History of Present Illness  82 yo female admitted with s/p R coloctomy 11/25/16. Hx of spinal stenosis, vertebal fx.  Clinical Impression  On eval, pt required Min assist for mobility. She walked ~75 feet in the hallway with 1 HHA/hallway handrail. Pt is generally weak and unsteady. Will recommend RW use for safety and HHPT follow up. Daughter present during session (Brianna [name])-stated she can provide care as needed.     Follow Up Recommendations Home health PT;Supervision/Assistance - 24 hour    Equipment Recommendations  None recommended by PT    Recommendations for Other Services       Precautions / Restrictions Precautions Precautions: Fall Restrictions Weight Bearing Restrictions: No      Mobility  Bed Mobility               General bed mobility comments: oob in recliner  Transfers Overall transfer level: Needs assistance   Transfers: Sit to/from Stand Sit to Stand: Min guard         General transfer comment: close guard for safety.   Ambulation/Gait Ambulation/Gait assistance: Min assist Ambulation Distance (Feet): 75 Feet Assistive device: 1 person hand held assist (vs hallway handrail) Gait Pattern/deviations: Step-through pattern;Decreased stride length;Decreased step length - right;Decreased step length - left     General Gait Details: very slow unsteady gait. Used handrail vs 1 HHA. Will likely need to consider RW use.   Stairs            Wheelchair Mobility    Modified Rankin (Stroke Patients Only)       Balance Overall balance assessment: Needs assistance           Standing balance-Leahy Scale: Poor                               Pertinent Vitals/Pain Pain Assessment: 0-10 Pain Score: 7  Pain Location: abdomen Pain Descriptors / Indicators: Sore Pain Intervention(s): Monitored during  session    Home Living Family/patient expects to be discharged to:: Private residence Living Arrangements: Alone Available Help at Discharge: Family (daughter states she can provide 24 hour care)           Home Equipment: Gilford Rile - 2 wheels;Cane - single point      Prior Function Level of Independence: Independent               Hand Dominance        Extremity/Trunk Assessment   Upper Extremity Assessment Upper Extremity Assessment: Generalized weakness    Lower Extremity Assessment Lower Extremity Assessment: Generalized weakness    Cervical / Trunk Assessment Cervical / Trunk Assessment: Kyphotic  Communication   Communication: No difficulties  Cognition Arousal/Alertness: Awake/alert Behavior During Therapy: WFL for tasks assessed/performed Overall Cognitive Status: Within Functional Limits for tasks assessed                      General Comments      Exercises     Assessment/Plan    PT Assessment Patient needs continued PT services  PT Problem List Decreased mobility;Decreased strength;Decreased activity tolerance;Decreased balance;Pain;Decreased knowledge of use of DME          PT Treatment Interventions DME instruction;Gait training;Therapeutic activities;Therapeutic exercise;Patient/family education;Functional mobility training;Balance training    PT Goals (Current goals can be found in the Care Plan section)  Acute  Rehab PT Goals Patient Stated Goal: home soon PT Goal Formulation: With patient/family Time For Goal Achievement: 12/12/16 Potential to Achieve Goals: Good    Frequency Min 3X/week   Barriers to discharge        Co-evaluation               End of Session   Activity Tolerance: Patient tolerated treatment well Patient left: in chair;with call bell/phone within reach;with family/visitor present           Time: [id number] PT Time Calculation (min) (ACUTE ONLY): 10 min   Charges:   PT Evaluation $PT  Eval Low Complexity: 1 Procedure     PT G Codes:        Weston Anna, MPT Pager: [phone]

## 2016-11-29 DIAGNOSIS — E611 Iron deficiency: Secondary | ICD-10-CM

## 2016-11-29 LAB — CBC
HCT: 21.2 % — ABNORMAL LOW (ref 36.0–46.0)
HEMOGLOBIN: 7.5 g/dL — AB (ref 12.0–15.0)
MCH: 32.2 pg (ref 26.0–34.0)
MCHC: 35.4 g/dL (ref 30.0–36.0)
MCV: 91 fL (ref 78.0–100.0)
Platelets: 467 10*3/uL — ABNORMAL HIGH (ref 150–400)
RBC: 2.33 MIL/uL — AB (ref 3.87–5.11)
RDW: 16.3 % — ABNORMAL HIGH (ref 11.5–15.5)
WBC: 10.7 10*3/uL — ABNORMAL HIGH (ref 4.0–10.5)

## 2016-11-29 LAB — RETICULOCYTES
RBC.: 2.34 MIL/uL — AB (ref 3.87–5.11)
RETIC CT PCT: 5.8 % — AB (ref 0.4–3.1)
Retic Count, Absolute: 135.7 10*3/uL (ref 19.0–186.0)

## 2016-11-29 LAB — IRON AND TIBC
IRON: 76 ug/dL (ref 28–170)
SATURATION RATIOS: 41 % — AB (ref 10.4–31.8)
TIBC: 186 ug/dL — ABNORMAL LOW (ref 250–450)
UIBC: 110 ug/dL

## 2016-11-29 LAB — PREPARE RBC (CROSSMATCH)

## 2016-11-29 LAB — FERRITIN: FERRITIN: 2013 ng/mL — AB (ref 11–307)

## 2016-11-29 MED ORDER — FUROSEMIDE 10 MG/ML IJ SOLN
20.0000 mg | Freq: Once | INTRAMUSCULAR | Status: DC
  Filled 2016-11-29: qty 2

## 2016-11-29 MED ORDER — DIPHENHYDRAMINE HCL 50 MG/ML IJ SOLN: 12.5000 mg | Freq: Once | INTRAMUSCULAR | Status: AC

## 2016-11-29 MED ORDER — HYDROCODONE-ACETAMINOPHEN 5-325 MG PO TABS: 1.0000 | ORAL_TABLET | ORAL | 0 refills | Status: DC | PRN

## 2016-11-29 MED ORDER — ACETAMINOPHEN 325 MG PO TABS
650.0000 mg | ORAL_TABLET | Freq: Once | ORAL | Status: AC
  Administered 2016-11-29: 650 mg via ORAL
  Filled 2016-11-29: qty 2

## 2016-11-29 MED ORDER — SODIUM CHLORIDE 0.9 % IV SOLN
Freq: Once | INTRAVENOUS | Status: AC
  Administered 2016-11-29: 08:00:00 via INTRAVENOUS

## 2016-11-29 MED ORDER — DIPHENHYDRAMINE HCL 25 MG PO CAPS
25.0000 mg | ORAL_CAPSULE | Freq: Once | ORAL | Status: AC
  Administered 2016-11-29: 25 mg via ORAL
  Filled 2016-11-29: qty 1

## 2016-11-29 MED ORDER — FOLIC ACID 1 MG PO TABS
2.0000 mg | ORAL_TABLET | Freq: Every day | ORAL | Status: DC
  Administered 2016-11-29: 2 mg via ORAL
  Filled 2016-11-29: qty 2

## 2016-11-29 MED ORDER — DIPHENHYDRAMINE HCL 50 MG/ML IJ SOLN
12.5000 mg | Freq: Once | INTRAMUSCULAR | Status: AC
  Administered 2016-11-29: 12.5 mg via INTRAVENOUS
  Filled 2016-11-29: qty 1

## 2016-11-29 NOTE — Progress Notes (Signed)
Spoke with patient at bedside. Patient is agreeable to [facility] services. She has used [facility] in the past and would like to use them again. Has 3n1, RW, and shower stool lat home. Does not need further DME. Daughter will provide assistance at home. Awaiting final orders, per patient, hope to d/c today. Contacted [facility] for referral, they will follow of d/c needs. [phone]

## 2016-11-29 NOTE — Discharge Summary (Signed)
Physician Discharge Summary [facility]  Patient ID: Brianna [name] MRN: [id number] DOB/AGE: October 31, 1924 82 y.o.  Admit date: 11/24/2016 Discharge date: 11/29/2016  Admission Diagnoses:  Adenocarcinoma of the colon  Discharge Diagnoses:  Principal Problem:   Carcinoma of transverse colon s/p extended right colectomy 12/12/17/2015 Active Problems:   Hypertension   Anxiety   Discharged Condition: good  Hospital Course: Patient was admitted for observation following colon surgery.  Post op course was uncomplicated.  Pain was well controlled.  Tolerated diet.  Patient was prepared for discharge home on POD#4.  Consults: medical oncology - Dr. Julieanne Manson  Treatments: surgery: extended right colectomy  Discharge Exam: Blood pressure (!) 155/63, pulse 82, temperature 98.5 F (36.9 C), temperature source Oral, resp. rate 18, height 5\' 3"  (1.6 m), weight 49.9 kg (110 lb), SpO2 95 %. HEENT - clear Neck - soft Chest - clear bilaterally Cor - RRR Abd - mild distension; midline wound dry and intact with staples in place; active BS present  Disposition: Home  Discharge Instructions    Diet - low sodium heart healthy    Complete by:  As directed    Discharge instructions    Complete by:  As directed    [facility]  OPEN ABDOMINAL SURGERY: POST OP INSTRUCTIONS  Always review your discharge instruction sheet given to you by the facility where your surgery was performed.  A prescription for pain medication may be given to you upon discharge.  Take your pain medication as prescribed.  If narcotic pain medicine is not needed, then you may take acetaminophen (Tylenol) or ibuprofen (Advil) as needed. Take your usually prescribed medications unless otherwise directed. If you need a refill on your pain medication, please contact your pharmacy. They will contact our office to request authorization.  Prescriptions will not be filled after 5 pm or on  weekends. You should follow a light diet the first few days after arrival home, such as soup and crackers, unless your doctor has advised otherwise. A high-fiber, low fat diet can be resumed as tolerated.  Be sure to include plenty of fluids daily.  Most patients will experience some swelling and bruising in the area of the incision. Ice packs will help. Swelling and bruising can take several days to resolve. It is common to experience some constipation if taking pain medication after surgery.  Increasing fluid intake and taking a stool softener will usually help or prevent this problem from occurring.  A mild laxative (Milk of Magnesia or Miralax) should be taken according to package directions if there are no bowel movements after 48 hours.  You may have steri-strips (small skin tapes) in place directly over the incision.  These strips should be left on the skin for 5-7 days.  Any sutures or staples will be removed at the office during your follow-up visit. You may find that a light gauze bandage over your incision may keep your staples from being rubbed or pulled. You may shower and replace the bandage daily. ACTIVITIES:  You may resume regular (light) daily activities beginning the next day - such as daily self-care, walking, climbing stairs - gradually increasing activities as tolerated.  You may have sexual intercourse when it is comfortable.  Refrain from any heavy lifting or straining until approved by your doctor.  You may drive when you no longer are taking prescription pain medication, you can comfortably wear a seatbelt, and you can safely maneuver your car and apply brakes. You should  see your doctor in the office for a follow-up appointment approximately 2-3 weeks after your surgery.  Make sure that you call for this appointment within a day or two after you arrive home to insure a convenient appointment time.  WHEN TO CALL YOUR DOCTOR: Fever greater than 101.0 Inability to  urinate Persistent nausea and/or vomiting Extreme swelling or bruising Continued bleeding from incision Increased pain, redness, or drainage from the incision Difficulty swallowing or breathing Muscle cramping or spasms Numbness or tingling in hands or around lips  IF YOU HAVE DISABILITY OR FAMILY LEAVE FORMS, YOU MUST BRING THEM TO THE OFFICE FOR PROCESSING.  PLEASE DO NOT GIVE THEM TO YOUR DOCTOR.  The clinic staff is available to answer your questions during regular business hours.  Please don't hesitate to call and ask to speak to one of the nurses if you have concerns.  [facility] [place] Office: [phone]  For further questions, please visit [email or web address]   Increase activity slowly    Complete by:  As directed    No dressing needed    Complete by:  As directed      Allergies as of 11/29/2016      Reactions   Other Nausea And Vomiting   *All pain medications*- all narcotics bother her   Compazine [prochlorperazine] Other (See Comments)   Restless, hallucinations   Erythromycin    "passed out"   Lyrica [pregabalin] Other (See Comments)   Sleep walking   Promethazine Other (See Comments)   Restless, hallucinations   Tramadol Nausea And Vomiting   Vistaril [hydroxyzine Hcl] Other (See Comments)   Stated she had hallucinations.      Medication List    TAKE these medications   ALPRAZolam 0.5 MG tablet Commonly known as:  XANAX Take 1 tablet (0.5 mg total) by mouth at bedtime as needed for anxiety.   HYDROcodone-acetaminophen 5-325 MG tablet Commonly known as:  NORCO/VICODIN Take 1-2 tablets by mouth every 4 (four) hours as needed for moderate pain.   ibuprofen 200 MG tablet Commonly known as:  ADVIL,MOTRIN Take 400 mg by mouth every 6 (six) hours as needed for mild pain.   metoprolol succinate 25 MG 24 hr tablet Commonly known as:  TOPROL-XL Take 1 tablet (25 mg total) by mouth daily.   multivitamin with minerals tablet Take 1  tablet by mouth daily.   SYSTANE OP Apply 2 drops to eye 3 (three) times daily as needed (dry eyes).      Follow-up Information    [facility] Follow up.   Why:  nurse and physical therapy Contact information: [place] [address] [phone]        Earnstine Regal, MD. Schedule an appointment as soon as possible for a visit in 1 week(s).   Specialty:  General Surgery Why:  for wound check and staple removal. Contact information: [place] [address] [phone]        Betsy Coder, MD. Call.   Specialty:  Oncology Why:  Notify of transfusion of one unit of blood.  Arrange follow up lab work. Contact information: [place] [address] [phone]           Caterin Tabares M. Lossie Kalp, MD, [facility] Office: [phone]   Signed: Earnstine Regal 11/29/2016, 4:30 PM

## 2016-11-29 NOTE — Progress Notes (Signed)
Brianna [name] is doing okay. She has hemoglobin of 7.5. I spent a she is hemolyzing. She has cold agglutinin disease. We will go ahead and transfuse her.  Given that she just had surgery, she may also have iron deficiency. I'll check iron studies on her. If her iron levels are low, then I will give her a dose of IV iron.  She really needs to be on folic acid. I will start her on 2 mg of folic acid.  She is eating. There is no bowel movement yet. She is ambulating.  She's had no fever. There's been no cough. She's had no nausea or vomiting.  On her physical exam, her temperature is 97.9. Pulse is 80. Blood pressure 141/62. Head and neck exam shows no ocular or oral lesions. She has no scleral icterus. She has no adenopathy in the neck. Lungs are clear bilaterally. Cardiac exam regular rate and rhythm. She is 1/6 systolic murmur. Abdomen is soft. Bowel sounds are present. She has a healing laparotomy scar. Extremities shows some trace edema in her legs. Skin exam shows no rashes.  [name] has colon cancer. The exact surgical stage not yet known. I'm sure pathology will come out today.  We will go and give her 2 units of blood. Again I suspect she is hemolyzing. I'll check a reticulocyte count on her.  We will give her blood at that is warm. We'll start her on folic acid.  We will continue to follow her along.  [name], MD  Oswaldo Milian 11:6

## 2016-11-29 NOTE — Progress Notes (Signed)
Pt and family were given discharge instructions and prescriptions. All questions were answered and patient was taken by wheelchair to the main entrance where daughter took her home. Brianna [name]

## 2016-11-29 NOTE — Care Management Important Message (Signed)
Important Message  Patient Details  Name: Brianna [name] MRN: [id number] Date of Birth: 1924/07/10   Medicare Important Message Given:       Kerin Salen 11/29/2016, 2:09 [place] Message  Patient Details  Name: Brianna [name] MRN: [id number] Date of Birth: 1924-04-16   Medicare Important Message Given:       Kerin Salen 11/29/2016, 2:09 PM

## 2016-11-29 NOTE — Progress Notes (Signed)
OT Cancellation Note  Patient Details Name: Evarista Reichenberger MRN: [id number] DOB: 24-Apr-1924   Cancelled Treatment:    Reason Eval/Treat Not Completed: Other (comment)  Pt states she got settled in bed and breakfast/lunch arrived as well as pt getting blood.  OT will check back on pt next day per pt request.  Kari Baars, OT [phone]  Payton Mccallum D 11/29/2016, 2:20 PM

## 2016-11-30 ENCOUNTER — Other Ambulatory Visit: Payer: Self-pay | Admitting: *Deleted

## 2016-11-30 ENCOUNTER — Telehealth: Payer: Self-pay | Admitting: Oncology

## 2016-11-30 ENCOUNTER — Telehealth: Payer: Self-pay | Admitting: *Deleted

## 2016-11-30 DIAGNOSIS — D5912 Cold autoimmune hemolytic anemia: Secondary | ICD-10-CM

## 2016-11-30 DIAGNOSIS — C184 Malignant neoplasm of transverse colon: Secondary | ICD-10-CM

## 2016-11-30 DIAGNOSIS — D591 Other autoimmune hemolytic anemias: Secondary | ICD-10-CM

## 2016-11-30 LAB — TYPE AND SCREEN
Blood Product Expiration Date: 201801022359
ISSUE DATE / TIME: 201712181110
Unit Type and Rh: 5100

## 2016-11-30 NOTE — Telephone Encounter (Signed)
sw pt dtr to confirm 12/22 appt date/time per LOS

## 2016-11-30 NOTE — Telephone Encounter (Signed)
Message received from patient's daughter, Shauna Hugh stating that pt was discharged from the hospital yesterday and received only 1 of 2 units of PRBC's ordered d/t 2nd unit was not available for transfusion.  Patient's daughter would like to know if patient should come to [facility] sooner then scheduled appt on 12/09/16 since only 1 unit of PRBC's was given yesterday.  Per L. Thomas NP, patient to come in this Friday, 12/03/16 for lab and possible transfusion.  Message sent to schedulers and message left with [name], patient's daughter to call [place] back for above information.

## 2016-12-02 ENCOUNTER — Ambulatory Visit: Payer: Medicare Other

## 2016-12-02 ENCOUNTER — Telehealth: Payer: Self-pay | Admitting: *Deleted

## 2016-12-02 NOTE — Telephone Encounter (Signed)
Call from pt's daughter requesting to change lab appt to today. She is concerned the crossmatch will not be available soon enough on 12/22. Informed her per my last conversation with blood bank; now that they're aware of her crossmatch issue they are able to match her blood easier. Appt will be changed to accommodate patient.   0933: Pt's daughter returned call, pt doesn't feel up to it. She may not bring her in today. Called blood bank, spoke with Rip Harbour, they do recommend checking crossmatch day several hours prior to transfusion.

## 2016-12-02 NOTE — Telephone Encounter (Signed)
Called pt's daughter, pt doesn't feel well enough to come in today. Informed daughter that crossmatch may take longer. May need to do one unit on 12/22 and the other on Sat 12/23. She voiced understanding.

## 2016-12-03 ENCOUNTER — Encounter: Payer: Self-pay | Admitting: *Deleted

## 2016-12-03 ENCOUNTER — Other Ambulatory Visit (HOSPITAL_BASED_OUTPATIENT_CLINIC_OR_DEPARTMENT_OTHER): Payer: Medicare Other

## 2016-12-03 ENCOUNTER — Ambulatory Visit: Payer: Medicare Other

## 2016-12-03 ENCOUNTER — Other Ambulatory Visit: Payer: Self-pay | Admitting: *Deleted

## 2016-12-03 DIAGNOSIS — C184 Malignant neoplasm of transverse colon: Secondary | ICD-10-CM

## 2016-12-03 DIAGNOSIS — D591 Other autoimmune hemolytic anemias: Secondary | ICD-10-CM

## 2016-12-03 DIAGNOSIS — D5912 Cold autoimmune hemolytic anemia: Secondary | ICD-10-CM

## 2016-12-03 LAB — CBC WITH DIFFERENTIAL/PLATELET
BASO%: 0.7 % (ref 0.0–2.0)
Basophils Absolute: 0.1 10*3/uL (ref 0.0–0.1)
EOS ABS: 0.3 10*3/uL (ref 0.0–0.5)
EOS%: 2 % (ref 0.0–7.0)
HCT: 26.8 % — ABNORMAL LOW (ref 34.8–46.6)
HGB: 9 g/dL — ABNORMAL LOW (ref 11.6–15.9)
LYMPH%: 13.8 % — AB (ref 14.0–49.7)
MCH: 31.6 pg (ref 25.1–34.0)
MCHC: 33.6 g/dL (ref 31.5–36.0)
MCV: 94 fL (ref 79.5–101.0)
MONO#: 1.1 10*3/uL — ABNORMAL HIGH (ref 0.1–0.9)
MONO%: 6.9 % (ref 0.0–14.0)
NEUT%: 76.6 % (ref 38.4–76.8)
NEUTROS ABS: 12.8 10*3/uL — AB (ref 1.5–6.5)
NRBC: 0 % (ref 0–0)
PLATELETS: 476 10*3/uL — AB (ref 145–400)
RBC: 2.85 10*6/uL — AB (ref 3.70–5.45)
RDW: 16.8 % — AB (ref 11.2–14.5)
WBC: 16.6 10*3/uL — AB (ref 3.9–10.3)
lymph#: 2.3 10*3/uL (ref 0.9–3.3)

## 2016-12-03 NOTE — Progress Notes (Signed)
Pt advised by Bishop Dublin, RN, that d/t hgb of 9, pt did not need PRBC on this date. Met pt in lobby and notified. She was assisted home by daughter.

## 2016-12-03 NOTE — Progress Notes (Signed)
Patient and patient's daughter, Shauna Hugh notified that pt.'s HGB is 9.0 and there is no need for blood transfusion today.  Patient states that she is slightly fatigued at this time with no other complaints.  Patient verbalizes an understanding to return as scheduled on 12/09/16 for labs, appt with Dr. Benay Spice and possible blood transfusion.  Patient and patient's daughter have no questions at this time and are appreciative of information.  Pt instructed to call [place] if needed prior to her scheduled appt on 12/09/16.

## 2016-12-08 ENCOUNTER — Telehealth: Payer: Self-pay | Admitting: *Deleted

## 2016-12-08 NOTE — Telephone Encounter (Signed)
ok 

## 2016-12-08 NOTE — Telephone Encounter (Signed)
Message from pt's daughter requesting to cancel lab/ transfusion appt for 12/28. Asks to keep original lab/office. Returned call, informed her we will add transfusion on 12/30 if needed.

## 2016-12-08 NOTE — Telephone Encounter (Signed)
Called pt's daughter, informed her that MD will be out of the office on 12/28. Will reschedule office visit to 12/23/16. Pt declined lab appt for 12/28- she will call for lab appt if pt feels like she needs transfusion prior to 1/11.

## 2016-12-09 ENCOUNTER — Encounter (HOSPITAL_COMMUNITY): Payer: Medicare Other

## 2016-12-09 ENCOUNTER — Ambulatory Visit: Payer: Medicare Other | Admitting: Oncology

## 2016-12-09 ENCOUNTER — Other Ambulatory Visit: Payer: Medicare Other

## 2016-12-14 ENCOUNTER — Telehealth: Payer: Self-pay | Admitting: *Deleted

## 2016-12-15 ENCOUNTER — Telehealth: Payer: Self-pay | Admitting: *Deleted

## 2016-12-15 ENCOUNTER — Ambulatory Visit (HOSPITAL_BASED_OUTPATIENT_CLINIC_OR_DEPARTMENT_OTHER): Payer: Medicare Other | Admitting: Oncology

## 2016-12-15 ENCOUNTER — Ambulatory Visit (HOSPITAL_BASED_OUTPATIENT_CLINIC_OR_DEPARTMENT_OTHER): Payer: Medicare Other

## 2016-12-15 ENCOUNTER — Other Ambulatory Visit: Payer: Self-pay | Admitting: *Deleted

## 2016-12-15 VITALS — BP 140/78 | HR 98 | Temp 98.2°F | Resp 17 | Ht 63.0 in | Wt 101.5 lb

## 2016-12-15 DIAGNOSIS — M549 Dorsalgia, unspecified: Secondary | ICD-10-CM

## 2016-12-15 DIAGNOSIS — C184 Malignant neoplasm of transverse colon: Secondary | ICD-10-CM

## 2016-12-15 DIAGNOSIS — D591 Other autoimmune hemolytic anemias: Secondary | ICD-10-CM

## 2016-12-15 LAB — COMPREHENSIVE METABOLIC PANEL
ALBUMIN: 3.7 g/dL (ref 3.5–5.0)
ALK PHOS: 103 U/L (ref 40–150)
ALT: 13 U/L (ref 0–55)
ANION GAP: 11 meq/L (ref 3–11)
AST: 17 U/L (ref 5–34)
BILIRUBIN TOTAL: 2.92 mg/dL — AB (ref 0.20–1.20)
BUN: 13.8 mg/dL (ref 7.0–26.0)
CALCIUM: 9.6 mg/dL (ref 8.4–10.4)
CHLORIDE: 97 meq/L — AB (ref 98–109)
CO2: 26 mEq/L (ref 22–29)
CREATININE: 0.6 mg/dL (ref 0.6–1.1)
EGFR: 78 mL/min/{1.73_m2} — ABNORMAL LOW (ref >90)
Glucose: 119 mg/dl (ref 70–140)
Potassium: 4.2 mEq/L (ref 3.5–5.1)
Sodium: 134 mEq/L — ABNORMAL LOW (ref 136–145)
TOTAL PROTEIN: 7.2 g/dL (ref 6.4–8.3)

## 2016-12-15 LAB — CBC WITH DIFFERENTIAL/PLATELET
BASO%: 0.5 % (ref 0.0–2.0)
Basophils Absolute: 0.1 10*3/uL (ref 0.0–0.1)
EOS ABS: 0.1 10*3/uL (ref 0.0–0.5)
EOS%: 0.8 % (ref 0.0–7.0)
HEMATOCRIT: 25.2 % — AB (ref 34.8–46.6)
HEMOGLOBIN: 8.7 g/dL — AB (ref 11.6–15.9)
LYMPH#: 2.5 10*3/uL (ref 0.9–3.3)
LYMPH%: 14.1 % (ref 14.0–49.7)
MCH: 32.7 pg (ref 25.1–34.0)
MCHC: 34.5 g/dL (ref 31.5–36.0)
MCV: 94.7 fL (ref 79.5–101.0)
MONO#: 1 10*3/uL — AB (ref 0.1–0.9)
MONO%: 5.8 % (ref 0.0–14.0)
NEUT#: 13.7 10*3/uL — ABNORMAL HIGH (ref 1.5–6.5)
NEUT%: 78.8 % — ABNORMAL HIGH (ref 38.4–76.8)
NRBC: 0 % (ref 0–0)
PLATELETS: 549 10*3/uL — AB (ref 145–400)
RBC: 2.66 10*6/uL — ABNORMAL LOW (ref 3.70–5.45)
RDW: 16.5 % — ABNORMAL HIGH (ref 11.2–14.5)
WBC: 17.4 10*3/uL — ABNORMAL HIGH (ref 3.9–10.3)

## 2016-12-15 LAB — TECHNOLOGIST REVIEW

## 2016-12-15 MED ORDER — HYDROMORPHONE HCL 2 MG PO TABS: 1.0000 mg | ORAL_TABLET | ORAL | 0 refills | Status: DC | PRN

## 2016-12-15 NOTE — Telephone Encounter (Signed)
Message received from patient's daughter requesting earlier appt then 12/23/16 with Dr. Benay Spice d/t patient's "increasing pain".

## 2016-12-15 NOTE — Telephone Encounter (Signed)
Message left with time and place for CT in the AM.

## 2016-12-15 NOTE — Telephone Encounter (Signed)
Call placed to patient's daughter [name] to confirm that she received message regarding CT scan in the AM.  [name] states that her mother will not be able to make the early appt and requests later appt or appt on Friday.  Will call central scheduling in the AM to reschedule CT scan.

## 2016-12-15 NOTE — Progress Notes (Addendum)
[facility] OFFICE PROGRESS NOTE   Diagnosis:  Colon cancer, anemia  INTERVAL HISTORY:   [name] returns prior to scheduled follow-up. She underwent a right colectomy on 11/25/2016. She was discharged home 11/29/2016. Her daughter reports her overall condition has declined since leaving the hospital. Her main complaint is worsening pain at left mid back and at times extending to just below the left breast. She has had this pain for about 6 months. She is not sure whether the pain originates anteriorly or posteriorly. Tylenol and Advil have not helped. The pain does not radiate into her legs. She has overall weakness, no specific leg weakness. No leg numbness. She tried a "TENS units" with brief improvement. She is having difficulty sleeping. Appetite is poor. She is losing weight. She has been constipated and recently started a laxative. She has nausea which she feels is related to the pain. She notes poor tolerance of prescription pain medications in the past with nausea/vomiting. Her family reports the only pain medication she can tolerate is "fentanyl". She has shortness of breath. She has a dry cough. No fever.  Objective:  Vital signs in last 24 hours:  Blood pressure 140/78, pulse 98, temperature 98.2 F (36.8 C), temperature source Oral, resp. rate 17, height 5' 3"  (1.6 m), weight 101 lb 8 oz (46 kg), SpO2 100 %.    HEENT: No thrush or ulcers. Resp: Lungs clear bilaterally. Cardio: Regular rate and rhythm. GI: Abdomen is soft. Tender left upper abdomen. No hepatomegaly or splenomegaly. No mass. Vascular: No leg edema. Musculoskeletal: Tender at the left mid back beginning just lateral to the spine.  Skin: No rash.   Lab Results:  Lab Results  Component Value Date   WBC 17.4 (H) 12/15/2016   HGB 8.7 (L) 12/15/2016   HCT 25.2 (L) 12/15/2016   MCV 94.7 12/15/2016   PLT 549 (H) 12/15/2016   NEUTROABS 13.7 (H) 12/15/2016    Imaging:  No results  found.  Medications: I have reviewed the patient's current medications.  Assessment/Plan: 1. Admission with fever and liver abscesses 06/19/2016  Status post placement of a liver drain on 06/21/2010, New drain placed 06/30/2016  Treated with broad-spectrum intravenous antibiotics, completed outpatient course of Augmentin   CT 07/20/2016-no residual abscess identified  CT 10/20/2016-improved appearance of the liver; no new liver lesions; apple core type lesion involving the mid transverse colon continuing for approximately 5.6 cm. No obstruction. Probable transmural disease extension especially anteriorly. Suspected enlarged nodes at the root of the transverse mesocolon; no pelvic sidewall adenopathy.  2. 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 Hyperbilirubinemia secondary to hemolysis and liver abscesses  5. CT abdomen/pelvis 10/20/2016-improved appearance of the liver; no new liver lesions; apple core type lesion involving the mid transverse colon continuing for approximately 5.6 cm. No obstruction. Probable transmural disease extension especially anteriorly. Suspected enlarged nodes at the root of the transverse mesocolon;no pelvic sidewall adenopathy.  Colonoscopy 11/11/2016 confirmed a transverse colon mass, biopsy revealed adenocarcinoma  Status post right colectomy 11/25/2016 (invasive poorly differentiated adenocarcinoma spanning 8 cm; tumor invades through muscularis propria into subserosal soft tissues including  involvement of mesenteric fat; lymph/vascular invasion  identified; margins negative; 6 of 14 lymph nodes positive  6.  6 month history of left mid back pain with recent worsening   Disposition: Brianna [name] presents today with recent worsening of left mid back pain. We discussed possible etiologies including compression fracture, rib fracture, infection; less likely related to the recent surgery or to cancer. We are referring her for a noncontrast CT scan of the chest to further evaluate. She provides a history of poor tolerance (nausea/vomiting) to pain medications. She will try hydromorphone 1 mg every 4 hours as needed.  Dr. Benay Spice reviewed the pathology report from the colon surgery with Brianna [name] and her daughters. Discussion regarding adjuvant therapy with Xeloda was initiated. This will be discussed further once her pain is under control.  Hemoglobin is stable.  We will schedule a follow-up appointment once the date for the CT scan is determined.  She will contact the office with further problems.  Patient seen with Dr. Benay Spice. 30 minutes were spent face-to-face at today's visit with the majority of that time involved in counseling/coordination of care.    Ned Card ANP/GNP-BC   12/15/2016  4:43 PM  This was a shared visit with Ned Card. Brianna [name] was interviewed and examined. We reviewed the surgical pathology report with her. The etiology of the pain at the mid back is unclear. We discussed the differential diagnosis with Brianna [name] and her daughters. She will be referred for a CT of the chest. She appears to be very uncomfortable from constant back pain. She was started on a low-dose Duragesic patch and Dilaudid for pain.   Julieanne Manson, M.D.

## 2016-12-15 NOTE — Telephone Encounter (Signed)
Call placed back to patient's daughter and she states that patient is not sleeping or eating, is having an increase in abdominal pain and appears to be "yellow" in color.  She states that Aleve and Tylenol are no longer working for her mothers pain.  Dr. Benay Spice notified. Patient's daughter instructed per Dr. Benay Spice for pt to take Tramadol for pain.  [name] states that pt is unable to tolerate Tramadol and requests patch for pain.  Per Dr. Benay Spice, pt does not need patch at this time for pain and requests for pt to come in today to be seen.  Pt.'s daughter states that she can bring daughter in now for labs and to see Dr. Benay Spice.

## 2016-12-16 ENCOUNTER — Telehealth: Payer: Self-pay | Admitting: Oncology

## 2016-12-16 ENCOUNTER — Telehealth: Payer: Self-pay | Admitting: *Deleted

## 2016-12-16 ENCOUNTER — Other Ambulatory Visit: Payer: Self-pay | Admitting: Nurse Practitioner

## 2016-12-16 ENCOUNTER — Ambulatory Visit (HOSPITAL_COMMUNITY)
Admission: RE | Admit: 2016-12-16 | Discharge: 2016-12-16 | Disposition: A | Discharge status: Home / Self Care | Payer: Medicare Other | Source: Ambulatory Visit | Attending: Nurse Practitioner | Admitting: Nurse Practitioner

## 2016-12-16 ENCOUNTER — Encounter: Payer: Self-pay | Admitting: Radiology

## 2016-12-16 ENCOUNTER — Ambulatory Visit (HOSPITAL_COMMUNITY): Admission: RE | Admit: 2016-12-16 | Payer: Medicare Other | Source: Ambulatory Visit

## 2016-12-16 DIAGNOSIS — D649 Anemia, unspecified: Secondary | ICD-10-CM | POA: Diagnosis not present

## 2016-12-16 DIAGNOSIS — I700 Atherosclerosis of aorta: Secondary | ICD-10-CM | POA: Insufficient documentation

## 2016-12-16 DIAGNOSIS — M546 Pain in thoracic spine: Secondary | ICD-10-CM | POA: Insufficient documentation

## 2016-12-16 DIAGNOSIS — R918 Other nonspecific abnormal finding of lung field: Secondary | ICD-10-CM | POA: Insufficient documentation

## 2016-12-16 DIAGNOSIS — I2510 Atherosclerotic heart disease of native coronary artery without angina pectoris: Secondary | ICD-10-CM | POA: Diagnosis not present

## 2016-12-16 DIAGNOSIS — K769 Liver disease, unspecified: Secondary | ICD-10-CM | POA: Insufficient documentation

## 2016-12-16 DIAGNOSIS — C184 Malignant neoplasm of transverse colon: Secondary | ICD-10-CM

## 2016-12-16 DIAGNOSIS — Z9049 Acquired absence of other specified parts of digestive tract: Secondary | ICD-10-CM | POA: Insufficient documentation

## 2016-12-16 DIAGNOSIS — R109 Unspecified abdominal pain: Secondary | ICD-10-CM | POA: Insufficient documentation

## 2016-12-16 DIAGNOSIS — J479 Bronchiectasis, uncomplicated: Secondary | ICD-10-CM | POA: Diagnosis not present

## 2016-12-16 DIAGNOSIS — K599 Functional intestinal disorder, unspecified: Secondary | ICD-10-CM

## 2016-12-16 MED ORDER — FENTANYL 12 MCG/HR TD PT72: 12.5000 ug | MEDICATED_PATCH | TRANSDERMAL | 0 refills | Status: DC

## 2016-12-16 NOTE — Telephone Encounter (Signed)
Call placed to patient to give her new time for stat CT scan.  Patient instructed to be at radiology at [facility] at 3:15PM and CT will be done at 3:30PM.  Patient states she was not able to tolerate Dilaudid d/t nausea and lightheadedness.  She states that her pain continues despite taking Dilaudid and that she will not take anymore Dilaudid. Dr. Benay Spice notified.

## 2016-12-16 NOTE — Telephone Encounter (Signed)
Informed pt that Duragesic patch prescription is ready for pick up. She will pick it up before CT today.

## 2016-12-16 NOTE — Telephone Encounter (Signed)
spoke to patient to advise that appointment for 12/23/16 at 4:00P has been changed to 12/20/16 at 2:30P

## 2016-12-16 NOTE — Telephone Encounter (Signed)
Close encounter 

## 2016-12-17 ENCOUNTER — Ambulatory Visit (HOSPITAL_BASED_OUTPATIENT_CLINIC_OR_DEPARTMENT_OTHER): Payer: Medicare Other | Admitting: Nurse Practitioner

## 2016-12-17 ENCOUNTER — Ambulatory Visit (HOSPITAL_COMMUNITY)
Admission: RE | Admit: 2016-12-17 | Discharge: 2016-12-17 | Disposition: A | Discharge status: Home / Self Care | Payer: Medicare Other | Source: Ambulatory Visit | Attending: Nurse Practitioner | Admitting: Nurse Practitioner

## 2016-12-17 ENCOUNTER — Encounter: Payer: Self-pay | Admitting: *Deleted

## 2016-12-17 ENCOUNTER — Telehealth: Payer: Self-pay | Admitting: Nurse Practitioner

## 2016-12-17 ENCOUNTER — Other Ambulatory Visit: Payer: Self-pay | Admitting: Nurse Practitioner

## 2016-12-17 ENCOUNTER — Telehealth: Payer: Self-pay

## 2016-12-17 VITALS — BP 135/72 | HR 94 | Temp 98.4°F | Resp 18 | Ht 63.0 in

## 2016-12-17 DIAGNOSIS — R590 Localized enlarged lymph nodes: Secondary | ICD-10-CM | POA: Diagnosis not present

## 2016-12-17 DIAGNOSIS — D591 Other autoimmune hemolytic anemias: Secondary | ICD-10-CM

## 2016-12-17 DIAGNOSIS — N289 Disorder of kidney and ureter, unspecified: Secondary | ICD-10-CM | POA: Diagnosis not present

## 2016-12-17 DIAGNOSIS — K769 Liver disease, unspecified: Secondary | ICD-10-CM | POA: Insufficient documentation

## 2016-12-17 DIAGNOSIS — Z9049 Acquired absence of other specified parts of digestive tract: Secondary | ICD-10-CM | POA: Diagnosis not present

## 2016-12-17 DIAGNOSIS — R110 Nausea: Secondary | ICD-10-CM

## 2016-12-17 DIAGNOSIS — M4854XA Collapsed vertebra, not elsewhere classified, thoracic region, initial encounter for fracture: Secondary | ICD-10-CM | POA: Diagnosis not present

## 2016-12-17 DIAGNOSIS — K750 Abscess of liver: Secondary | ICD-10-CM

## 2016-12-17 DIAGNOSIS — R1907 Generalized intra-abdominal and pelvic swelling, mass and lump: Secondary | ICD-10-CM | POA: Diagnosis not present

## 2016-12-17 DIAGNOSIS — C184 Malignant neoplasm of transverse colon: Secondary | ICD-10-CM

## 2016-12-17 DIAGNOSIS — R1010 Upper abdominal pain, unspecified: Secondary | ICD-10-CM

## 2016-12-17 DIAGNOSIS — M549 Dorsalgia, unspecified: Secondary | ICD-10-CM

## 2016-12-17 DIAGNOSIS — Z7189 Other specified counseling: Secondary | ICD-10-CM | POA: Insufficient documentation

## 2016-12-17 MED ORDER — ONDANSETRON 4 MG PO TBDP: 4.0000 mg | ORAL_TABLET | Freq: Three times a day (TID) | ORAL | 0 refills | Status: DC | PRN

## 2016-12-17 MED ORDER — IOPAMIDOL (ISOVUE-300) INJECTION 61%
75.0000 mL | Freq: Once | INTRAVENOUS | Status: AC | PRN
  Administered 2016-12-17: 75 mL via INTRAVENOUS

## 2016-12-17 MED ORDER — HYOSCYAMINE SULFATE 0.125 MG PO TABS: 0.1250 mg | ORAL_TABLET | Freq: Three times a day (TID) | ORAL | 0 refills | Status: DC | PRN

## 2016-12-17 MED ORDER — IOPAMIDOL (ISOVUE-300) INJECTION 61%
30.0000 mL | Freq: Once | INTRAVENOUS | Status: AC | PRN
  Administered 2016-12-17: 30 mL via ORAL

## 2016-12-17 MED ORDER — IOPAMIDOL (ISOVUE-300) INJECTION 61%
INTRAVENOUS | Status: AC
  Filled 2016-12-17: qty 30

## 2016-12-17 MED ORDER — IOPAMIDOL (ISOVUE-300) INJECTION 61%
INTRAVENOUS | Status: AC
  Filled 2016-12-17: qty 75

## 2016-12-17 NOTE — Progress Notes (Addendum)
[place] OFFICE PROGRESS NOTE   Diagnosis:  Colon cancer, anemia  INTERVAL HISTORY:   Brianna [name] returns prior to scheduled follow-up to review CT results. She was last seen on 12/15/2016 for evaluation of back/chest pain. She was referred for a CT scan of the chest on 12/16/2016 with no etiology for the pain identified. There was interval development of multifocal ill-defined lesions within the liver suspicious for recurrent abscesses. We referred her for a CT scan of the abdomen/pelvis earlier today.  She does not feel well. No fever. She is nauseated intermittently. Appetite is poor. Energy level is poor. She is mainly having pain at the left upper abdomen. She tried Dilaudid and noted nausea and dizziness. A Duragesic patch was placed last night. Her daughter found the patch stuck to her nightgown early this morning. She also describes a separate "crampy" abdominal pain. She takes Levsin with good relief of this pain.  Objective:  Vital signs in last 24 hours:  Blood pressure 135/72, pulse 94, temperature 98.4 F (36.9 C), temperature source Oral, resp. rate 18, height 5' 3"  (1.6 m), SpO2 100 %.    GI: Abdomen is soft. Tender at the left upper abdomen. No hepatomegaly. No splenomegaly. No mass. Vascular: No leg edema. Neuro: Alert and oriented. Follows commands.  Skin: Warm and dry. No rash.    Lab Results:  Lab Results  Component Value Date   WBC 17.4 (H) 12/15/2016   HGB 8.7 (L) 12/15/2016   HCT 25.2 (L) 12/15/2016   MCV 94.7 12/15/2016   PLT 549 (H) 12/15/2016   NEUTROABS 13.7 (H) 12/15/2016    Imaging:  Ct Chest Wo Contrast  Result Date: 12/16/2016 CLINICAL DATA:  Back pain. EXAM: CT CHEST WITHOUT CONTRAST TECHNIQUE: Multidetector CT imaging of the chest was performed following the standard protocol without IV contrast. COMPARISON:  CT chest from 12/10/2013 and CT abdomen and pelvis from 10/20/2016 FINDINGS: Cardiovascular: The heart size is  mildly enlarged. There is a small pericardial effusion identified. Aortic atherosclerosis. Calcification involving the LAD coronary artery is identified. Mediastinum/Nodes: No enlarged mediastinal or axillary lymph nodes. Thyroid gland, trachea, and esophagus demonstrate no significant findings. Lungs/Pleura: There is no pleural effusion. Nodular density within the right middle lobe measures 1.3 x 1.0 cm, image 104 of series 7. This is within an area of previous inflammatory changes identified on study from 10/20/2016. There are adjacent areas of bronchiectasis and mucoid impaction within the right middle lobe. Within the posterior right upper lobe there is evidence of bronchiectasis, image 43 of series 7. Upper Abdomen: There are multifocal ill defined low-attenuation foci within the liver. Within the left lobe there is a focus measuring 2.5 cm, image 121 of series 2. Three foci are identified within the right lobe. The largest measures 2.7 cm. Findings are suspicious for recurrent multifocal liver abscesses. In this patient who has a history of colon cancer multifocal liver metastasis not excluded. Musculoskeletal: The bones are osteopenic. There is multi level degenerative disc disease and mild scoliosis involving the thoracic spine. No chest wall mass or suspicious bone lesions identified. IMPRESSION: 1. Interval development of multifocal ill defined low-attenuation lesions within the liver which are suspicious for recurrent abscesses . Less likely, but not excluded, are multifocal liver metastasis in this patient who has a history of carcinoma of the transverse colon. More definitive assessment of the liver with contrast enhanced cross-sectional imaging is recommended. Either liver protocol MRI or CT would be advised. 2. 13 mm nodular density  in the right middle lobe is likely post infectious or inflammatory in etiology. Consider one of the following in 3 months for both low-risk and high-risk individuals: (a)  repeat chest CT, (b) follow-up PET-CT, or (c) tissue sampling. This recommendation follows the consensus statement: Guidelines for Management of Incidental Pulmonary Nodules Detected on CT Images: From the Fleischner Society 2017; Radiology 2017; 284:228-243. 3. Multifocal areas of bronchiectasis identified within the lungs compatible with recurrent inflammation/infection 4. Aortic atherosclerosis and coronary artery disease. Electronically Signed   By: Kerby Moors M.D.   On: 12/16/2016 16:17   Ct Abdomen Pelvis W Contrast  Result Date: 12/17/2016 CLINICAL DATA:  Left-sided abdominal pain for 6 months. Transverse colon cancer. Hepatic abscess EXAM: CT ABDOMEN AND PELVIS WITH CONTRAST TECHNIQUE: Multidetector CT imaging of the abdomen and pelvis was performed using the standard protocol following bolus administration of intravenous contrast. CONTRAST:  66m ISOVUE-300 IOPAMIDOL (ISOVUE-300) INJECTION 61%, 330mISOVUE-300 IOPAMIDOL (ISOVUE-300) INJECTION 61% COMPARISON:  10/20/2016 FINDINGS: Lower chest: Subsegmental airway plugging in the right middle lobe. Hepatobiliary: Numerous new scattered hypodense hepatic lesions, an index lesion posteriorly in the right hepatic lobe measures 2.4 by 2.3 cm on image 20 of series 2 and was not previously present. Over 10 additional liver lesions are present. Well it is possible that 1 or more of these could be abscesses, the appearance is more characteristic of metastatic lesions. Pancreas: Several tiny hypodensities in the pancreas are stable from 06/19/2016. Although nonspecific these are likely of limited clinical import in the context of the patient's overall clinical scenario. Spleen: Unremarkable Adrenals/Urinary Tract: Adrenal glands normal. Hypodense renal lesions are present, some represent cysts but a 1.6 by 1.5 cm complex left mid kidney lesion is technically nonspecific and somewhat high in density compared to the other lesions. Stomach/Bowel: Right  hemicolectomy noted with removal of the original: Mass. Vascular/Lymphatic: Aortoiliac atherosclerotic vascular disease. Lymph node along the left hemidiaphragmatic crus measures 1 cm in short axis image 16/2, not previously visible. Indistinct porta hepatis, difficult to exclude possible adenopathy in this region. There is abnormal centrally necrotic mesenteric adenopathy with a conglomerate 3.6 by 2.5 cm central mesenteric mass shown on image 36/2. Reproductive: Unremarkable Other: Diffuse mesenteric edema. Faint nodularity along parts of the omentum, omental tumor nodularity difficult to exclude. Musculoskeletal: Diffuse bony demineralization. Prior compression fractures at T11, L1, and L3 common not appreciably changed. Prominent right and moderate left degenerative chondral thinning in the hips. IMPRESSION: 1. Numerous new scattered hypodense hepatic lesions, index lesion about 2.4 cm in diameter. While scattered hepatic abscesses are not entirely excluded as a possibility, these lesions are more characteristic of metastatic disease. 2. Necrotic conglomerate central mesenteric adenopathy compatible with active tumor. Questionable omental deposits of tumor. 3. Interval right hemicolectomy. 4. Borderline adenopathy in the retrocrural region bilaterally. 5. Diffuse mesenteric edema. 6. Subsegmental airway plugging on the right middle lobe. 7. Prior compression fractures at T11, L1, and L3 unchanged. 8. Stable low complex left mid kidney lesion is technically nonspecific and measures 1.6 by 1.5 cm. Other renal lesions appear to represent benign cysts. Electronically Signed   By: WaVan Clines.D.   On: 12/17/2016 16:00    Medications: I have reviewed the patient's current medications.  Assessment/Plan: 1. Admission with fever and liver abscesses 06/19/2016  Status post placement of a liver drain on 06/21/2010, New drain placed 06/30/2016  Treated with broad-spectrum intravenous antibiotics,  completed outpatient course of Augmentin   CT 07/20/2016-no residual abscess identified  CT 10/20/2016-improved appearance  of the liver; no new liver lesions; apple core type lesion involving the mid transverse colon continuing for approximately 5.6 cm. No obstruction. Probable transmural disease extension especially anteriorly. Suspected enlarged nodes at the root of the transverse mesocolon; no pelvic sidewall adenopathy.  Chest CT 10/16/2017-interval development of multifocal ill-defined low attenuation lesions within the liver suspicious for recurrent abscesses.  Abdomen/pelvis CT 12/17/2016-numerous new scattered hypodense hepatic lesions. Necrotic conglomerate central mesenteric adenopathy. Questionable omental deposits of tumor. Borderline adenopathy in the retrocrural region bilaterally. Diffuse mesenteric edema.  2. 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 Hyperbilirubinemia secondary to hemolysis and liver abscesses  5. CT abdomen/pelvis 10/20/2016-improved appearance of the liver; no new liver lesions; apple core type lesion involving the mid transverse colon continuing for approximately 5.6 cm. No obstruction. Probable transmural disease extension especially anteriorly. Suspected enlarged nodes at the root of the transverse mesocolon;no pelvic sidewall adenopathy.  Colonoscopy 11/11/2016 confirmed a transverse colon mass, biopsy revealed adenocarcinoma  Status post right colectomy 11/25/2016 (invasive poorly  differentiated adenocarcinoma spanning 8 cm; tumor invades through muscularis propria into subserosal soft tissues including involvement of mesenteric fat; lymph/vascular invasion identified; margins negative; 6 of 14 lymph nodes positive  Loss of major and minor MMR proteins MLH1 and PMS2  MSI - HIGH   Abdomen/pelvis CT 12/17/2016-numerous new scattered hypodense hepatic lesions. Necrotic conglomerate central mesenteric adenopathy. Questionable omental deposits of tumor. Borderline adenopathy in the retrocrural region bilaterally. Diffuse mesenteric edema.  6.  6 month history of left mid back pain with recent worsening. CT chest 12/16/2016 showed no specific etiology for the pain. Interval development of multifocal ill-defined lesions within the liver suspicious for recurrent abscesses noted. 13 mm nodular density right middle lobe likely post infectious or inflammatory. Multifocal areas of bronchiectasis. 12/17/2016-the pain may be related to the mesenteric mass.   Disposition: Brianna [name] appears unchanged. The CT scan from earlier today shows multiple new liver lesions that appear more consistent with metastatic disease then hepatic abscesses. The scan also shows a mesenteric mass and possible omental deposits of tumor. The mesenteric mass may explain the pain she is experiencing.  The tumor shows loss of MLH1 and PMS2. We have requested BRAF testing.   The tumor also shows high microsatellite instability.  Dr. Benay Spice reviewed the the above with Brianna [name] and her daughter Brianna [name]. The CT images were reviewed with them on the computer. They understand the findings on the CT scan most likely represent metastatic colon cancer and no therapy will be curative.  We discussed options to include a supportive care approach, a modified chemotherapy regimen, immunotherapy with Pembrolizumab. We reviewed potential toxicities associated with 5-FU/oxaliplatin. We also discussed potential toxicities  associate with Pembrolizumab. She is interested in proceeding with Pembrolizumab in hopes of improving the pain she is experiencing. She and her daughter would like to consider the options over the next few days before making a final decision.  For pain control she will continue the low-dose Duragesic patch. She will try the Dilaudid again as well.   Prescriptions were sent to her pharmacy for Zofran 4 mg every 8 hours as needed for nausea and Levsin 0.125 mg every 8  hours as needed for abdominal cramping.  She will return for a follow-up visit on 12/20/2016 for further discussion. She will contact the office in the interim with any problems.  Patient seen with Dr. Benay Spice. 45 minutes were spent face-to-face at today's visit with the majority of that time involved in counseling/coordination of care.    Ned Card ANP/GNP-BC   12/17/2016  5:04 PM This was a shared visit with Ned Card. Brianna [name] was interviewed and examined. We reviewed the CT images with her. I suspect the abdominal pain is related to the necrotic mesenteric node and potentially carcinomatosis. She appears to have metastatic colon cancer. We discussed treatment options. The transverse colon tumor returned MSI-high with a pattern of mismatch repair protein loss suggestive of a sporadic tumor. There is a significant chance she could respond to a PD1 inhibitor. We recommend treatment with pembrolizumab. She will return for an office visit and further discussion on 12/20/2016.  We adjusted the pain regimen today.  Julieanne Manson, M.D.

## 2016-12-17 NOTE — Telephone Encounter (Signed)
I contacted Brianna [name]'s daughter Brianna [name] regarding the chest CT result. There is no explanation for the back or left-sided abdominal pain. There was concern for recurrent hepatic abscesses. We are referring her for a CT scan of the abdomen/pelvis.

## 2016-12-17 NOTE — Telephone Encounter (Signed)
Called [name] back, patients daughter to inform her radiology could get patient in today and to go ahead and bring her in. [name] verbalized understanding and will get pt ready

## 2016-12-17 NOTE — Progress Notes (Signed)
Oncology Nurse Navigator Documentation  Oncology Nurse Navigator Flowsheets 12/17/2016  Navigator Location [facility]  Navigator Encounter Type Letter/Fax/Email  Surgery Date 11/25/2016  Barriers/Navigation Needs Coordination of Care  Interventions Coordination of Care  Coordination of Care -  Acuity -  Email request to [facility] for BRAF testing on following case:  Patient: Brianna [name], Brianna [name] Collected: 11/25/2016 Client: [facility] Accession: [id number] Received: 11/25/2016 Brianna [name] DOB: 12-19-1923 Age: 83 Gender: F Reported: 11/29/2016 [place] Patient Ph: [phone] MRN #: [id number] [place] [address] [place], [place] [address] Visit #: [id number] Chart #: Phone: [phone] Fax: CC: REPORT

## 2016-12-17 NOTE — Progress Notes (Signed)
START OFF PATHWAY REGIMEN - Colorectal  Off Pathway: Pembrolizumab 200 mg q21 Days  [id number]:Pembrolizumab 200 mg q21 Days:   A cycle is 21 days:     Pembrolizumab [facility])) 200 mg flat dose in 50 mL NS IV over 30 minutes every 21 days.  Inline filter required (low-protein binding) Dose Mod: None Additional Orders: Severe immune-mediated reactions can occur. See prescribing information for more details and required immediate management with steroids. Monitor thyroid, renal, liver function tests, glucose, and sodium at baseline and before each  dose of pembrolizumab. Ref: Keytruda(R) (pembrolizumab) prescribing information, 2016.  **Always confirm dose/schedule in your pharmacy ordering system**    Patient Characteristics: Metastatic Colorectal, First Line, Nonsurgical Candidate, KRAS Mutation Positive/Unknown, BRAF Wild-Type/Unknown, PS = 0,1; Bevacizumab Ineligible AJCC T Stage: 3 AJCC N Stage: 2a AJCC Stage Grouping: IVB AJCC M Stage: 1b Current evidence of distant metastases? Yes BRAF Mutation Status: Awaiting Test Results KRAS/NRAS Mutation Status: Did Not Order Test Line of therapy: First Line Would you be surprised if this patient died  in the next year? I would NOT be surprised if this patient died in the next year Performance Status: PS = 0, 1 Bevacizumab Eligibility: Ineligible  Intent of Therapy: Non-Curative / Palliative Intent, Discussed with Patient

## 2016-12-20 ENCOUNTER — Ambulatory Visit (HOSPITAL_BASED_OUTPATIENT_CLINIC_OR_DEPARTMENT_OTHER): Payer: Medicare Other | Admitting: Oncology

## 2016-12-20 ENCOUNTER — Telehealth: Payer: Self-pay | Admitting: Oncology

## 2016-12-20 ENCOUNTER — Ambulatory Visit (HOSPITAL_COMMUNITY)
Admission: RE | Admit: 2016-12-20 | Discharge: 2016-12-20 | Disposition: A | Discharge status: Home / Self Care | Payer: Medicare Other | Source: Ambulatory Visit | Attending: Oncology | Admitting: Oncology

## 2016-12-20 ENCOUNTER — Other Ambulatory Visit (HOSPITAL_BASED_OUTPATIENT_CLINIC_OR_DEPARTMENT_OTHER): Payer: Medicare Other

## 2016-12-20 ENCOUNTER — Other Ambulatory Visit: Payer: Self-pay | Admitting: *Deleted

## 2016-12-20 VITALS — BP 118/49 | HR 99 | Temp 98.6°F | Resp 16 | Ht 63.0 in | Wt 102.9 lb

## 2016-12-20 DIAGNOSIS — C184 Malignant neoplasm of transverse colon: Secondary | ICD-10-CM

## 2016-12-20 DIAGNOSIS — R1907 Generalized intra-abdominal and pelvic swelling, mass and lump: Secondary | ICD-10-CM

## 2016-12-20 DIAGNOSIS — G4700 Insomnia, unspecified: Secondary | ICD-10-CM | POA: Diagnosis not present

## 2016-12-20 DIAGNOSIS — D649 Anemia, unspecified: Secondary | ICD-10-CM | POA: Insufficient documentation

## 2016-12-20 DIAGNOSIS — R109 Unspecified abdominal pain: Secondary | ICD-10-CM

## 2016-12-20 DIAGNOSIS — D591 Other autoimmune hemolytic anemias: Secondary | ICD-10-CM

## 2016-12-20 DIAGNOSIS — K769 Liver disease, unspecified: Secondary | ICD-10-CM

## 2016-12-20 DIAGNOSIS — D598 Other acquired hemolytic anemias: Secondary | ICD-10-CM | POA: Diagnosis not present

## 2016-12-20 DIAGNOSIS — D5912 Cold autoimmune hemolytic anemia: Secondary | ICD-10-CM

## 2016-12-20 LAB — CBC WITH DIFFERENTIAL/PLATELET
BASO%: 0.2 % (ref 0.0–2.0)
BASOS ABS: 0 10*3/uL (ref 0.0–0.1)
EOS ABS: 0.1 10*3/uL (ref 0.0–0.5)
EOS%: 1 % (ref 0.0–7.0)
HCT: 23.5 % — ABNORMAL LOW (ref 34.8–46.6)
HEMOGLOBIN: 8 g/dL — AB (ref 11.6–15.9)
LYMPH#: 2.4 10*3/uL (ref 0.9–3.3)
LYMPH%: 18.1 % (ref 14.0–49.7)
MCH: 32.4 pg (ref 25.1–34.0)
MCHC: 34 g/dL (ref 31.5–36.0)
MCV: 95.1 fL (ref 79.5–101.0)
MONO#: 1.2 10*3/uL — ABNORMAL HIGH (ref 0.1–0.9)
MONO%: 8.8 % (ref 0.0–14.0)
NEUT%: 71.9 % (ref 38.4–76.8)
NEUTROS ABS: 9.6 10*3/uL — AB (ref 1.5–6.5)
NRBC: 0 % (ref 0–0)
PLATELETS: 466 10*3/uL — AB (ref 145–400)
RBC: 2.47 10*6/uL — ABNORMAL LOW (ref 3.70–5.45)
RDW: 15.9 % — AB (ref 11.2–14.5)
WBC: 13.4 10*3/uL — AB (ref 3.9–10.3)

## 2016-12-20 MED ORDER — HYOSCYAMINE SULFATE 0.125 MG SL SUBL: 0.1250 mg | SUBLINGUAL_TABLET | SUBLINGUAL | 0 refills | Status: DC | PRN

## 2016-12-20 MED ORDER — HYDROMORPHONE HCL 2 MG PO TABS: 1.0000 mg | ORAL_TABLET | ORAL | 0 refills | Status: DC | PRN

## 2016-12-20 MED ORDER — ALPRAZOLAM 0.25 MG PO TABS: 0.2500 mg | ORAL_TABLET | Freq: Every evening | ORAL | 0 refills | Status: DC | PRN

## 2016-12-20 MED ORDER — FENTANYL 25 MCG/HR TD PT72: 25.0000 ug | MEDICATED_PATCH | TRANSDERMAL | 0 refills | Status: DC

## 2016-12-20 MED ORDER — ONDANSETRON 8 MG PO TBDP: 8.0000 mg | ORAL_TABLET | Freq: Three times a day (TID) | ORAL | 0 refills | Status: DC | PRN

## 2016-12-20 NOTE — Progress Notes (Signed)
No chemo class needed per Dr. Benay Spice.

## 2016-12-20 NOTE — Progress Notes (Signed)
[name] OFFICE PROGRESS NOTE   Diagnosis: Colon cancer, anemia  INTERVAL HISTORY:   [name] returns as scheduled. The Duragesic patch has not helped the abdominal pain. She is having bowel movements. She reports anorexia, but is taking 2 nutrition supplements per day. Her bowels are moving. He has insomnia. She presents today with 2 of her daughters for further discussion regarding treatment of the metastatic colon cancer.  Objective:  Vital signs in last 24 hours:  Blood pressure (!) 118/49, pulse 99, temperature 98.6 F (37 C), temperature source Oral, resp. rate 16, height _0  (1.6 m), weight 102 lb 14.4 oz (46.7 kg), SpO2 100 %.   Resp: Coarse rhonchi at the lower posterior chest bilaterally, no respiratory distress Cardio: Regular rate and rhythm GI: No hepatomegaly, no mass, nontender Vascular: No leg edema   Lab Results:  Lab Results  Component Value Date   WBC 13.4 (H) 12/20/2016   HGB 8.0 (L) 12/20/2016   HCT 23.5 (L) 12/20/2016   MCV 95.1 12/20/2016   PLT 466 (H) 12/20/2016   NEUTROABS 9.6 (H) 12/20/2016      Imaging:  Ct Abdomen Pelvis W Contrast  Result Date: 12/17/2016 CLINICAL DATA:  Left-sided abdominal pain for 6 months. Transverse colon cancer. Hepatic abscess EXAM: CT ABDOMEN AND PELVIS WITH CONTRAST TECHNIQUE: Multidetector CT imaging of the abdomen and pelvis was performed using the standard protocol following bolus administration of intravenous contrast. CONTRAST:  20m ISOVUE-300 IOPAMIDOL (ISOVUE-300) INJECTION 61%, 359mISOVUE-300 IOPAMIDOL (ISOVUE-300) INJECTION 61% COMPARISON:  10/20/2016 FINDINGS: Lower chest: Subsegmental airway plugging in the right middle lobe. Hepatobiliary: Numerous new scattered hypodense hepatic lesions, an index lesion posteriorly in the right hepatic lobe measures 2.4 by 2.3 cm on image 20 of series 2 and was not previously present. Over 10 additional liver lesions are present. Well it is possible  that 1 or more of these could be abscesses, the appearance is more characteristic of metastatic lesions. Pancreas: Several tiny hypodensities in the pancreas are stable from 06/19/2016. Although nonspecific these are likely of limited clinical import in the context of the patient's overall clinical scenario. Spleen: Unremarkable Adrenals/Urinary Tract: Adrenal glands normal. Hypodense renal lesions are present, some represent cysts but a 1.6 by 1.5 cm complex left mid kidney lesion is technically nonspecific and somewhat high in density compared to the other lesions. Stomach/Bowel: Right hemicolectomy noted with removal of the original: Mass. Vascular/Lymphatic: Aortoiliac atherosclerotic vascular disease. Lymph node along the left hemidiaphragmatic crus measures 1 cm in short axis image 16/2, not previously visible. Indistinct porta hepatis, difficult to exclude possible adenopathy in this region. There is abnormal centrally necrotic mesenteric adenopathy with a conglomerate 3.6 by 2.5 cm central mesenteric mass shown on image 36/2. Reproductive: Unremarkable Other: Diffuse mesenteric edema. Faint nodularity along parts of the omentum, omental tumor nodularity difficult to exclude. Musculoskeletal: Diffuse bony demineralization. Prior compression fractures at T11, L1, and L3 common not appreciably changed. Prominent right and moderate left degenerative chondral thinning in the hips. IMPRESSION: 1. Numerous new scattered hypodense hepatic lesions, index lesion about 2.4 cm in diameter. While scattered hepatic abscesses are not entirely excluded as a possibility, these lesions are more characteristic of metastatic disease. 2. Necrotic conglomerate central mesenteric adenopathy compatible with active tumor. Questionable omental deposits of tumor. 3. Interval right hemicolectomy. 4. Borderline adenopathy in the retrocrural region bilaterally. 5. Diffuse mesenteric edema. 6. Subsegmental airway plugging on the right  middle lobe. 7. Prior compression fractures at T11, L1, and L3  unchanged. 8. Stable low complex left mid kidney lesion is technically nonspecific and measures 1.6 by 1.5 cm. Other renal lesions appear to represent benign cysts. Electronically Signed   By: Van Clines M.D.   On: 12/17/2016 16:00    Medications: I have reviewed the patient's current medications.  Assessment/Plan: 1. Admission with fever and liver abscesses 06/19/2016  Status post placement of a liver drain on 06/21/2010, New drain placed 06/30/2016  Treated with broad-spectrum intravenous antibiotics, completed outpatient course of Augmentin   CT 07/20/2016-no residual abscess identified  CT 10/20/2016-improved appearance of the liver; no new liver lesions; apple core type lesion involving the mid transverse colon continuing for approximately 5.6 cm. No obstruction. Probable transmural disease extension especially anteriorly. Suspected enlarged nodes at the root of the transverse mesocolon; no pelvic sidewall adenopathy.  Chest CT 10/16/2017-interval development of multifocal ill-defined low attenuation lesions within the liver suspicious for recurrent abscesses.  Abdomen/pelvis CT 12/17/2016-numerous new scattered hypodense hepatic lesions. Necrotic conglomerate central mesenteric adenopathy. Questionable omental deposits of tumor. Borderline adenopathy in the retrocrural region bilaterally. Diffuse mesenteric edema.  2. 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 Hyperbilirubinemia secondary to hemolysis and liver abscesses  5. CT abdomen/pelvis 10/20/2016-improved appearance of the liver; no new liver lesions; apple core type lesion involving the mid transverse colon continuing for approximately 5.6 cm. No obstruction. Probable transmural disease extension especially anteriorly. Suspected enlarged nodes at the root of the transverse mesocolon;no pelvic sidewall adenopathy.  Colonoscopy 11/11/2016 confirmed a transverse colon mass, biopsy revealed adenocarcinoma  Status post right colectomy 11/25/2016 (invasive poorly differentiated adenocarcinoma spanning 8 cm; tumor invades through muscularis propria into subserosal soft tissues including involvement of mesenteric fat; lymph/vascular invasion identified; margins negative; 6 of 14 lymph nodes positive  Loss of major and minor MMR proteins MLH1 and PMS2  MSI - HIGH   Abdomen/pelvis CT 12/17/2016-numerous new scattered hypodense hepatic lesions. Necrotic conglomerate central mesenteric adenopathy. Questionable omental deposits of tumor. Borderline adenopathy in the retrocrural region bilaterally. Diffuse mesenteric edema.  6. 6 month history of left mid back pain with recent worsening. CT chest 12/16/2016 showed no specific etiology for the pain. Interval development of multifocal ill-defined lesions within the liver suspicious for recurrent abscesses noted. 13 mm nodular density right middle lobe likely post infectious or inflammatory. Multifocal areas of bronchiectasis. 12/17/2016-the pain may be related to the mesenteric mass.   Disposition:  [name] appears unchanged. I discussed the colon cancer pathology with [name] and her daughters. The tumor returned MSI-high. There is a significant chance she could respond to immunotherapy. I recommend treatment with pembrolizumab. We reviewed the potential toxicities associated with this agent including the chance of a rash,  diarrhea, hepatitis, and endocrinopathies. She agrees to proceed. We increase the Duragesic dose to 25 g. She will try Dilaudid for breakthrough pain. She requested a prescription for Xanax to use as needed for sleep.  [name] will be transfused with 2 units of packed red blood cells when she is here on 12/22/2016. She will be scheduled for an office visit on 12/29/2016.  Approximately 25 minutes were spent with the patient today. The majority of the time  was used for counseling and coordination of care.  Betsy Coder, MD  12/20/2016  3:54 PM

## 2016-12-20 NOTE — Telephone Encounter (Signed)
Spoke with patient dtr re appointments for 1/10 and 1/17.

## 2016-12-21 ENCOUNTER — Other Ambulatory Visit: Payer: Self-pay | Admitting: *Deleted

## 2016-12-21 ENCOUNTER — Other Ambulatory Visit: Payer: Medicare Other

## 2016-12-21 ENCOUNTER — Telehealth: Payer: Self-pay | Admitting: *Deleted

## 2016-12-21 DIAGNOSIS — D598 Other acquired hemolytic anemias: Secondary | ICD-10-CM

## 2016-12-21 DIAGNOSIS — C184 Malignant neoplasm of transverse colon: Secondary | ICD-10-CM

## 2016-12-21 MED ORDER — ACETAMINOPHEN 325 MG PO TABS: 650.0000 mg | ORAL_TABLET | Freq: Once | ORAL | Status: DC

## 2016-12-21 MED ORDER — DIPHENHYDRAMINE HCL 25 MG PO TABS: 25.0000 mg | ORAL_TABLET | Freq: Once | ORAL | Status: DC

## 2016-12-21 NOTE — Addendum Note (Signed)
Addended by: Carolynne Edouard B on: 12/21/2016 03:10 PM   Modules accepted: Orders

## 2016-12-21 NOTE — Telephone Encounter (Signed)
Call placed to patient's daughter Stanton Kidney to inform her that Beryle Flock has been approved and that blood transfusion can be given at 0900 tomorrow.  Patient's daughter to discuss time with pt and call [facility] back to confirm appt time.

## 2016-12-21 NOTE — Telephone Encounter (Signed)
Return call from patient's daughter Stanton Kidney to confirm that 0900 appt time works for patient tomorrow.

## 2016-12-22 ENCOUNTER — Ambulatory Visit: Payer: Medicare Other

## 2016-12-22 ENCOUNTER — Ambulatory Visit (HOSPITAL_BASED_OUTPATIENT_CLINIC_OR_DEPARTMENT_OTHER): Payer: Medicare Other | Admitting: Nurse Practitioner

## 2016-12-22 ENCOUNTER — Telehealth: Payer: Self-pay | Admitting: Nurse Practitioner

## 2016-12-22 ENCOUNTER — Ambulatory Visit: Payer: Medicare Other | Admitting: Nutrition

## 2016-12-22 ENCOUNTER — Encounter: Payer: Self-pay | Admitting: *Deleted

## 2016-12-22 ENCOUNTER — Encounter: Payer: Self-pay | Admitting: Nurse Practitioner

## 2016-12-22 ENCOUNTER — Ambulatory Visit (HOSPITAL_BASED_OUTPATIENT_CLINIC_OR_DEPARTMENT_OTHER): Payer: Medicare Other

## 2016-12-22 VITALS — BP 137/72 | HR 102 | Temp 99.2°F | Resp 18

## 2016-12-22 DIAGNOSIS — T7840XA Allergy, unspecified, initial encounter: Secondary | ICD-10-CM | POA: Insufficient documentation

## 2016-12-22 DIAGNOSIS — T451X5A Adverse effect of antineoplastic and immunosuppressive drugs, initial encounter: Secondary | ICD-10-CM

## 2016-12-22 DIAGNOSIS — D591 Other autoimmune hemolytic anemias: Secondary | ICD-10-CM

## 2016-12-22 DIAGNOSIS — D598 Other acquired hemolytic anemias: Secondary | ICD-10-CM

## 2016-12-22 DIAGNOSIS — D5912 Cold autoimmune hemolytic anemia: Secondary | ICD-10-CM

## 2016-12-22 DIAGNOSIS — C184 Malignant neoplasm of transverse colon: Secondary | ICD-10-CM

## 2016-12-22 DIAGNOSIS — D649 Anemia, unspecified: Secondary | ICD-10-CM

## 2016-12-22 DIAGNOSIS — D500 Iron deficiency anemia secondary to blood loss (chronic): Secondary | ICD-10-CM

## 2016-12-22 DIAGNOSIS — Z5112 Encounter for antineoplastic immunotherapy: Secondary | ICD-10-CM

## 2016-12-22 LAB — PREPARE RBC (CROSSMATCH)

## 2016-12-22 MED ORDER — SODIUM CHLORIDE 0.9 % IV SOLN
Freq: Once | INTRAVENOUS | Status: AC
  Administered 2016-12-22: 10:00:00 via INTRAVENOUS

## 2016-12-22 MED ORDER — SODIUM CHLORIDE 0.9% FLUSH
3.0000 mL | INTRAVENOUS | Status: DC | PRN
  Filled 2016-12-22: qty 10

## 2016-12-22 MED ORDER — ACETAMINOPHEN 325 MG PO TABS
ORAL_TABLET | ORAL | Status: AC
  Filled 2016-12-22: qty 2

## 2016-12-22 MED ORDER — DIPHENHYDRAMINE HCL 25 MG PO CAPS
ORAL_CAPSULE | ORAL | Status: AC
  Filled 2016-12-22: qty 1

## 2016-12-22 MED ORDER — DIPHENHYDRAMINE HCL 50 MG/ML IJ SOLN
25.0000 mg | Freq: Once | INTRAMUSCULAR | Status: AC
  Administered 2016-12-22: 25 mg via INTRAVENOUS

## 2016-12-22 MED ORDER — SODIUM CHLORIDE 0.9% FLUSH
10.0000 mL | INTRAVENOUS | Status: DC | PRN
  Filled 2016-12-22: qty 10

## 2016-12-22 MED ORDER — SODIUM CHLORIDE 0.9 % IV SOLN
200.0000 mg | Freq: Once | INTRAVENOUS | Status: AC
  Administered 2016-12-22: 200 mg via INTRAVENOUS
  Filled 2016-12-22: qty 8

## 2016-12-22 MED ORDER — METHYLPREDNISOLONE SODIUM SUCC 125 MG IJ SOLR: 125.0000 mg | Freq: Once | INTRAMUSCULAR | Status: AC

## 2016-12-22 MED ORDER — METHYLPREDNISOLONE SODIUM SUCC 125 MG IJ SOLR
125.0000 mg | Freq: Once | INTRAMUSCULAR | Status: DC
  Administered 2016-12-22: 125 mg via INTRAVENOUS

## 2016-12-22 MED ORDER — DIPHENHYDRAMINE HCL 25 MG PO CAPS
25.0000 mg | ORAL_CAPSULE | Freq: Once | ORAL | Status: AC
  Administered 2016-12-22: 25 mg via ORAL

## 2016-12-22 MED ORDER — ACETAMINOPHEN 325 MG PO TABS
650.0000 mg | ORAL_TABLET | Freq: Once | ORAL | Status: AC
  Administered 2016-12-22: 650 mg via ORAL

## 2016-12-22 NOTE — Assessment & Plan Note (Signed)
Patient presented to the [facility] today to receive her first cycle of [place] immunotherapy.  Patient is scheduled to return for labs and a follow-up visit on 12/29/2016.

## 2016-12-22 NOTE — Assessment & Plan Note (Signed)
Patient's hemoglobin had dropped down to 8.0.  Patient had presented to the [facility] earlier today to receive 2 units of blood transfusion.  She developed some chest wall discomfort in the midst of the first unit of blood; and received Benadryl, Pepcid, and Solu-Medrol for any possible issues with reaction.  All symptoms completely resolved; and patient was able to proceed with her blood transfusion as planned.  Reviewed all findings with Dr. Benay Spice; and he advised that this was most likely not an actual transfusion reaction.

## 2016-12-22 NOTE — Progress Notes (Signed)
83 year old female diagnosed with metastatic colon cancer.  She is a patient of Dr. Julieanne Manson.  Past medical history includes osteoporosis, hypertension, hyperlipidemia, and anxiety.  Medications include Xanax, multivitamin, Zofran.  Labs include sodium 134, albumin 3.7 on January 3.  Height: 63 inches.   Weight: 102.9 pounds. Usual body weight: 120 pounds in July 2017. BMI: 18.23.  Patient reports she has had abdominal pain when eating. She is drinking Carnation breakfast essentials. Patient verbalizes concern regarding weight loss. Reports she has been slowly advancing her diet per M.D.  Nutrition diagnosis:  Unintended weight loss related to metastatic colon cancer as evidenced by 14% weight loss over 6 months.  Intervention: Patient educated on strategies for increasing calories and protein. I provided fact sheet on increasing calories and protein and encouraged small frequent meals and snacks. Recommended patient continue Carnation breakfast essentials and provided recipes for fortified milk. Patient should consume Carnation breakfast essentials twice daily. Educated patient on slow diet advancement. Questions were answered.  Teach back method used.  Monitoring, evaluation, goals:  Patient will tolerate increased calories and protein to minimize weight loss.  Next visit: To be scheduled as needed.  **Disclaimer: This note was dictated with voice recognition software. Similar sounding words can inadvertently be transcribed and this note may contain transcription errors which may not have been corrected upon publication of note.**

## 2016-12-22 NOTE — Telephone Encounter (Signed)
NO 1/10 LOS FROM [facility] VISIT

## 2016-12-22 NOTE — Patient Instructions (Addendum)
[facility] Discharge Instructions for Patients Receiving Chemotherapy  Today you received the following chemotherapy agents: Keytruda.  To help prevent nausea and vomiting after your treatment, we encourage you to take your nausea medication If you develop nausea and vomiting that is not controlled by your nausea medication, call the clinic.   BELOW ARE SYMPTOMS THAT SHOULD BE REPORTED IMMEDIATELY:  *FEVER GREATER THAN 100.5 F  *CHILLS WITH OR WITHOUT FEVER  NAUSEA AND VOMITING THAT IS NOT CONTROLLED WITH YOUR NAUSEA MEDICATION  *UNUSUAL SHORTNESS OF BREATH  *UNUSUAL BRUISING OR BLEEDING  TENDERNESS IN MOUTH AND THROAT WITH OR WITHOUT PRESENCE OF ULCERS  *URINARY PROBLEMS  *BOWEL PROBLEMS  UNUSUAL RASH Items with * indicate a potential emergency and should be followed up as soon as possible.  Feel free to call the clinic you have any questions or concerns. The clinic phone number is [phone].  Please show the [facility] at check-in to the Emergency Department and triage nurse.    Pembrolizumab injection What is this medicine? PEMBROLIZUMAB (pem broe liz ue mab) is a monoclonal antibody. It is used to treat melanoma, head and neck cancer, Hodgkin lymphoma, and non-small cell lung cancer. COMMON BRAND NAME(S): Keytruda What should I tell my health care provider before I take this medicine? They need to know if you have any of these conditions: -diabetes -immune system problems -inflammatory bowel disease -liver disease -lung or breathing disease -lupus -organ transplant -an unusual or allergic reaction to pembrolizumab, other medicines, foods, dyes, or preservatives -pregnant or trying to get pregnant -breast-feeding How should I use this medicine? This medicine is for infusion into a vein. It is given by a health care professional in a hospital or clinic setting. A special MedGuide will be given to you before each treatment. Be sure to  read this information carefully each time. Talk to your pediatrician regarding the use of this medicine in children. While this drug may be prescribed for selected conditions, precautions do apply. What if I miss a dose? It is important not to miss your dose. Call your doctor or health care professional if you are unable to keep an appointment. What may interact with this medicine? Interactions have not been studied. Give your health care provider a list of all the medicines, herbs, non-prescription drugs, or dietary supplements you use. Also tell them if you smoke, drink alcohol, or use illegal drugs. Some items may interact with your medicine. What should I watch for while using this medicine? Your condition will be monitored carefully while you are receiving this medicine. You may need blood work done while you are taking this medicine. Do not become pregnant while taking this medicine or for 4 months after stopping it. Women should inform their doctor if they wish to become pregnant or think they might be pregnant. There is a potential for serious side effects to an unborn child. Talk to your health care professional or pharmacist for more information. Do not breast-feed an infant while taking this medicine or for 4 months after the last dose. What side effects may I notice from receiving this medicine? Side effects that you should report to your doctor or health care professional as soon as possible: -allergic reactions like skin rash, itching or hives, swelling of the face, lips, or tongue -bloody or black, tarry -breathing problems -changes in vision -chest pain -chills -constipation -cough -dizziness or feeling faint or lightheaded -fast or irregular heartbeat -fever -flushing -hair loss -low blood counts -  this medicine may decrease the number of white blood cells, red blood cells and platelets. You may be at increased risk for infections and bleeding. -muscle pain -muscle  weakness -persistent headache -signs and symptoms of high blood sugar such as dizziness; dry mouth; dry skin; fruity breath; nausea; stomach pain; increased hunger or thirst; increased urination -signs and symptoms of kidney injury like trouble passing urine or change in the amount of urine -signs and symptoms of liver injury like dark urine, light-colored stools, loss of appetite, nausea, right upper belly pain, yellowing of the eyes or skin -stomach pain -sweating -weight loss Side effects that usually do not require medical attention (report to your doctor or health care professional if they continue or are bothersome): -decreased appetite -diarrhea -tiredness Where should I keep my medicine? This drug is given in a hospital or clinic and will not be stored at home.  2017 Elsevier/Gold Standard (2016-03-01 19:28:44)   Blood Transfusion , Adult A blood transfusion is a procedure in which you receive donated blood, including plasma, platelets, and red blood cells, through an IV tube. You may need a blood transfusion because of illness, surgery, or injury. The blood may come from a donor. You may also be able to donate blood for yourself (autologous blood donation) before a surgery if you know that you might require a blood transfusion. The blood given in a transfusion is made up of different types of cells. You may receive:  Red blood cells. These carry oxygen to the cells in the body.  White blood cells. These help you fight infections.  Platelets. These help your blood to clot.  Plasma. This is the liquid part of your blood and it helps with fluid imbalances. If you have hemophilia or another clotting disorder, you may also receive other types of blood products. Tell a health care provider about:  Any allergies you have.  All medicines you are taking, including vitamins, herbs, eye drops, creams, and over-the-counter medicines.  Any problems you or family members have had with  anesthetic medicines.  Any blood disorders you have.  Any surgeries you have had.  Any medical conditions you have, including any recent fever or cold symptoms.  Whether you are pregnant or may be pregnant.  Any previous reactions you have had during a blood transfusion. What are the risks? Generally, this is a safe procedure. However, problems may occur, including:  Having an allergic reaction to something in the donated blood. Hives and itching may be symptoms of this type of reaction.  Fever. This may be a reaction to the white blood cells in the transfused blood. Nausea or chest pain may accompany a fever.  Iron overload. This can happen from having many transfusions.  Transfusion-related acute lung injury (TRALI). This is a rare reaction that causes lung damage. The cause is not known.TRALI can occur within hours of a transfusion or several days later.  Sudden (acute) or delayed hemolytic reactions. This happens if your blood does not match the cells in your transfusion. Your body's defense system (immune system) may try to attack the new cells. This complication is rare. The symptoms include fever, chills, nausea, and low back pain or chest pain.  Infection or disease transmission. This is rare. What happens before the procedure?  You will have a blood test to determine your blood type. This is necessary to know what kind of blood your body will accept and to match it to the donor blood.  If you are going  to have a planned surgery, you may be able to do an autologous blood donation. This may be done in case you need to have a transfusion.  If you have had an allergic reaction to a transfusion in the past, you may be given medicine to help prevent a reaction. This medicine may be given to you by mouth or through an IV tube.  You will have your temperature, blood pressure, and pulse monitored before the transfusion.  Follow instructions from your health care provider about  eating and drinking restrictions.  Ask your health care provider about:  Changing or stopping your regular medicines. This is especially important if you are taking diabetes medicines or blood thinners.  Taking medicines such as aspirin and ibuprofen. These medicines can thin your blood. Do not take these medicines before your procedure if your health care provider instructs you not to. What happens during the procedure?  An IV tube will be inserted into one of your veins.  The bag of donated blood will be attached to your IV tube. The blood will then enter through your vein.  Your temperature, blood pressure, and pulse will be monitored regularly during the transfusion. This monitoring is done to detect early signs of a transfusion reaction.  If you have any signs or symptoms of a reaction, your transfusion will be stopped and you may be given medicine.  When the transfusion is complete, your IV tube will be removed.  Pressure may be applied to the IV site for a few minutes.  A bandage (dressing) will be applied. The procedure may vary among health care providers and hospitals. What happens after the procedure?  Your temperature, blood pressure, heart rate, breathing rate, and blood oxygen level will be monitored often.  Your blood may be tested to see how you are responding to the transfusion.  You may be warmed with fluids or blankets to maintain a normal body temperature. Summary  A blood transfusion is a procedure in which you receive donated blood, including plasma, platelets, and red blood cells, through an IV tube.  Your temperature, blood pressure, and pulse will be monitored before, during, and after the transfusion.  Your blood may be tested after the transfusion to see how your body has responded. This information is not intended to replace advice given to you by your health care provider. Make sure you discuss any questions you have with your health care  provider. Document Released: 11/26/2000 Document Revised: 08/26/2016 Document Reviewed: 08/26/2016 Elsevier Interactive Patient Education  2017 [employer].

## 2016-12-22 NOTE — Progress Notes (Signed)
5- Pt reports having severe bone pain on her lower back and tail bone area. Pt states that she has had this type of pain during blood transfusions before in the hospital but went away. This time, pt states that her lower back pain was a 15/10 and that her chest was starting to hurt as well. Stopped blood transfusion immediately and started a 2nd 22 g iv on left forearm. Flushed pt with normal saline wide open (see vitals in flowsheet). Selena Lesser, NP called to evaluate pt reaction. Pt immediately given 25mg  IV of benardryl and 125mg  solumedrol. Pt started to improve with her symptoms but the back pain still hurts. Obtained blood samples to send to blood bank. SM discussed pt event with Dr.Sherrill and doesn't feel that she had any blood reaction at this time. Per Dr.Sherrill, pt to move forward with blood transfusions. Pt had 15 minutes left with her 1st unit of blood. Clarified with SM that pt does not need any blood work or urine sample sent to lab for workup. Pt daughter at chairside now. Pt unable to tolerate long periods of sitting and will transfer pt to a bed, once she starts her 2nd transfusion of blood for comfort and pain relief. Pt daughter brought medication for cramps and nausea and will give those to pt while she is in treatment.   1500- Pt tolerating 2nd unit of blood transfusion with warmer. Pt is now lying on the bed and is still having some discomfort. Pt states that she has a fentanyl patch on her back and will feel better soon after she leaves to go home. Pt does live alone, but her daughter is a Marine scientist and lives across the street from her house. Told pt to call for assistance at the cancer center for any further concerns when she gets home or go to the emergency room if her back pain or chest pain worsens. Daughter at bedside and verbalized understanding.

## 2016-12-22 NOTE — Assessment & Plan Note (Signed)
Patient presented to the [facility] today to receive her first cycle of Keytruda immunotherpay and also was in the midst of receiving the first unit of a blood transfusion as well.  She tolerated the immunotherapy infusion with no difficulty; but began complaining of some chest wall discomfort in the midst of the first bag of blood transfusion.  Blood transfusion was held; and patient was given Benadryl, Pepcid, and site Medrol per hypersensitivity protocol.  All symptoms resolved.  Reviewed all symptoms with Dr. Benay Spice; and it was thought most likely that this was not an actual hypersensitivity reaction; but instead discomfort to the chest wall secondary to disease.  However, decision was made for patient to proceed with a blood transfusion as prescribed.  Patient tolerated.  The rest of her blood transfusions with no further difficulty whatsoever.

## 2016-12-22 NOTE — Progress Notes (Signed)
SYMPTOM MANAGEMENT CLINIC    Chief Complaint: Hypersensitivity reaction  HPI:  Brianna [name] 83 y.o. female diagnosed with colon cancer and anemia.  Currently undergoing Keytruda immunotherapy and blood transfusion on an as-needed basis.    No history exists.    Review of Systems  Cardiovascular: Positive for chest pain.  All other systems reviewed and are negative.   Past Medical History:  Diagnosis Date  . Allergy   . Anxiety   . Arthritis   . Asthma   . Bladder infection   . Blood transfusion without reported diagnosis    nine in July 2017  . Clotting disorder ([place])    coldaagluten  . Hyperlipidemia    no meds taken  . Hypertension   . Leaky heart valve   . Liver abscess    July 2017  . Osteoporosis   . Pneumonia   . Thoracic vertebral fracture ([place])    T 11, unsure type of fracture    Past Surgical History:  Procedure Laterality Date  . APPENDECTOMY    . COLONOSCOPY    . COLONOSCOPY N/A 11/11/2016   Procedure: COLONOSCOPY;  Surgeon: Irene Shipper, MD;  Location: [facility];  Service: Endoscopy;  Laterality: N/A;  tba prior to appointment  . FRACTURE SURGERY     left shoulder  . IR GENERIC HISTORICAL  07/20/2016   IR RADIOLOGIST EVAL & MGMT 07/20/2016 Monia Sabal, PA-C [facility] INTERV RAD  . PARTIAL COLECTOMY N/A 11/25/2016   Procedure: EXTENDED RIGHT COLECTOMY;  Surgeon: Armandina Gemma, MD;  Location: [facility] ORS;  Service: General;  Laterality: N/A;  . 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    is allergic to other; compazine [prochlorperazine]; erythromycin; lyrica [pregabalin]; promethazine; tramadol; and vistaril [hydroxyzine hcl].  Allergies as of 12/22/2016      Reactions   Other Nausea And Vomiting   *All pain medications*- all narcotics bother her   Compazine [prochlorperazine] Other (See Comments)   Restless, hallucinations   Erythromycin    "passed out"   Lyrica [pregabalin] Other (See Comments)   Sleep walking   Promethazine Other (See Comments)   Restless, hallucinations   Tramadol Nausea And Vomiting   Vistaril [hydroxyzine Hcl] Other (See Comments)   Stated she had hallucinations.      Medication List       Accurate as of 12/22/16  3:14 PM. Always use your most recent med list.          ALPRAZolam 0.25 MG tablet Commonly known as:  XANAX Take 1 tablet (0.25 mg total) by mouth at bedtime as needed for anxiety.   fentaNYL 25 MCG/HR patch Commonly known as:  DURAGESIC - dosed mcg/hr Place 1 patch (25 mcg total) onto the skin every 3 (three) days.   HYDROcodone-acetaminophen 5-325 MG tablet Commonly known as:  NORCO/VICODIN Take 1-2 tablets by mouth every 4 (four) hours as needed for moderate pain.   HYDROmorphone 2 MG tablet Commonly known as:  DILAUDID Take 0.5 tablets (1 mg total) by mouth every 4 (four) hours as  needed for severe pain.   hyoscyamine 0.125 MG SL tablet Commonly known as:  LEVSIN SL Place 0.125 mg under the tongue every 4 (four) hours as needed.   ibuprofen 200 MG tablet Commonly known as:  ADVIL,MOTRIN Take 400 mg by mouth every 6 (six) hours as needed for mild pain.   metoprolol succinate 25 MG 24 hr tablet Commonly known as:  TOPROL-XL Take 1 tablet (25 mg total) by mouth daily.   multivitamin with minerals tablet Take 1 tablet by mouth daily.   ondansetron 8 MG disintegrating  tablet Commonly known as:  ZOFRAN ODT Take 1 tablet (8 mg total) by mouth every 8 (eight) hours as needed for nausea or vomiting.   SYSTANE OP Apply 2 drops to eye 3 (three) times daily as needed (dry eyes).        PHYSICAL EXAMINATION  Oncology Vitals 12/22/2016 12/22/2016  Height - -  Weight - -  Weight (lbs) - -  BMI (kg/m2) - -  Temp 99.1 99  Pulse 124 123  Resp 18 18  SpO2 100 97  BSA (m2) - -   BP Readings from Last 2 Encounters:  12/22/16 (!) 174/79  12/20/16 (!) 118/49    Physical Exam  Constitutional: She is oriented to person, place, and time and well-developed, well-nourished, and in no distress.  HENT:  Head: Normocephalic and atraumatic.  Mouth/Throat: Oropharynx is clear and moist.  Eyes: Conjunctivae and EOM are normal. Pupils are equal, round, and reactive to light. Right eye exhibits no discharge. Left eye exhibits no discharge. No scleral icterus.  Neck: Normal range of motion. Neck supple. No JVD present. No tracheal deviation present. No thyromegaly present.  Cardiovascular: Normal rate, regular rhythm, normal heart sounds and intact distal pulses.   Pulmonary/Chest: Effort normal and breath sounds normal. No respiratory distress. She has no wheezes. She has no rales. She exhibits no tenderness.  Abdominal: Soft. Bowel sounds are normal. She exhibits no distension and no mass. There is no tenderness. There is no rebound and no guarding.  Musculoskeletal: Normal range of motion. She exhibits no edema or tenderness.  Lymphadenopathy:    She has no cervical adenopathy.  Neurological: She is alert and oriented to person, place, and time. Gait normal.  Skin: Skin is warm and dry. No rash noted. No erythema. No pallor.  Psychiatric: Affect normal.  Nursing note and vitals reviewed.   LABORATORY DATA:. Hospital Outpatient Visit on 12/20/2016  Component Date Value Ref Range Status  . ABO/RH(D) 12/22/2016 A POS   Final  . Antibody Screen 12/22/2016 POS    Final  . Sample Expiration 12/22/2016 12/23/2016   Final  . DAT, IgG 12/22/2016 NEG   Final  . Unit Number 12/22/2016 [id number]   Final  . Blood Component Type 12/22/2016 RED CELLS,LR   Final  . Unit division 12/22/2016 00   Final  . Status of Unit 12/22/2016 ISSUED   Final  . Donor AG Type 12/22/2016 NEGATIVE FOR E ANTIGEN NEGATIVE FOR c ANTIGEN NEGATIVE FOR S ANTIGEN NEGATIVE FOR KELL ANTIGEN   Final  . Transfusion Status 12/22/2016 OK TO TRANSFUSE   Final  . Crossmatch Result 12/22/2016 COMPATIBLE   Final  . Unit Number 12/22/2016 [id number]   Final  . Blood Component Type 12/22/2016 RED CELLS,LR   Final  . Unit division 12/22/2016 00   Final  . Status of Unit 12/22/2016 ISSUED   Final  . Donor AG Type 12/22/2016 NEGATIVE FOR E ANTIGEN NEGATIVE FOR c  ANTIGEN NEGATIVE FOR S ANTIGEN NEGATIVE FOR KELL ANTIGEN   Final  . Transfusion Status 12/22/2016 OK TO TRANSFUSE   Final  . Crossmatch Result 12/22/2016 COMPATIBLE   Final  . Order Confirmation 12/22/2016 ORDER PROCESSED BY BLOOD BANK   Final  Appointment on 12/20/2016  Component Date Value Ref Range Status  . WBC 12/20/2016 13.4* 3.9 - 10.3 10e3/uL Final  . NEUT# 12/20/2016 9.6* 1.5 - 6.5 10e3/uL Final  . HGB 12/20/2016 8.0* 11.6 - 15.9 g/dL Final  . HCT 12/20/2016 23.5* 34.8 - 46.6 % Final  . Platelets 12/20/2016 466* 145 - 400 10e3/uL Final  . MCV 12/20/2016 95.1  79.5 - 101.0 fL Final  . MCH 12/20/2016 32.4  25.1 - 34.0 pg Final  . MCHC 12/20/2016 34.0  31.5 - 36.0 g/dL Final  . RBC 12/20/2016 2.47* 3.70 - 5.45 10e6/uL Final  . RDW 12/20/2016 15.9* 11.2 - 14.5 % Final  . lymph# 12/20/2016 2.4  0.9 - 3.3 10e3/uL Final  . MONO# 12/20/2016 1.2* 0.1 - 0.9 10e3/uL Final  . Eosinophils Absolute 12/20/2016 0.1  0.0 - 0.5 10e3/uL Final  . Basophils Absolute 12/20/2016 0.0  0.0 - 0.1 10e3/uL Final  . NEUT% 12/20/2016 71.9  38.4 - 76.8 % Final  . LYMPH% 12/20/2016 18.1  14.0 - 49.7 % Final  . MONO% 12/20/2016 8.8  0.0 - 14.0 %  Final  . EOS% 12/20/2016 1.0  0.0 - 7.0 % Final  . BASO% 12/20/2016 0.2  0.0 - 2.0 % Final  . nRBC 12/20/2016 0  0 - 0 % Final    RADIOGRAPHIC STUDIES: No results found.  ASSESSMENT/PLAN:    Hypersensitivity reaction Patient presented to the [facility] today to receive her first cycle of Keytruda immunotherpay and also was in the midst of receiving the first unit of a blood transfusion as well.  She tolerated the immunotherapy infusion with no difficulty; but began complaining of some chest wall discomfort in the midst of the first bag of blood transfusion.  Blood transfusion was held; and patient was given Benadryl, Pepcid, and site Medrol per hypersensitivity protocol.  All symptoms resolved.  Reviewed all symptoms with Dr. Benay Spice; and it was thought most likely that this was not an actual hypersensitivity reaction; but instead discomfort to the chest wall secondary to disease.  However, decision was made for patient to proceed with a blood transfusion as prescribed.  Patient tolerated.  The rest of her blood transfusions with no further difficulty whatsoever.  Carcinoma of transverse colon s/p extended right colectomy 12/12/17/2015 Patient presented to the [facility] today to receive her first cycle of [place] immunotherapy.  Patient is scheduled to return for labs and a follow-up visit on 12/29/2016.  Anemia Patient's hemoglobin had dropped down to 8.0.  Patient had presented to the [facility] earlier today to receive 2 units of blood transfusion.  She developed some chest wall discomfort in the midst of the first unit of blood; and received Benadryl, Pepcid, and Solu-Medrol for any possible issues with reaction.  All symptoms completely resolved; and patient was able to proceed with her blood transfusion as planned.  Reviewed all findings with Dr. Benay Spice; and he advised that this was most likely not an actual transfusion reaction.   Patient stated understanding of all  instructions; and was in agreement with this plan of care. The patient knows to call the clinic with any problems, questions or concerns.   Total time spent with patient was 15 minutes;  with  greater than 75 percent of that time spent in face to face counseling regarding patient's symptoms,  and coordination of care and follow up.  Disclaimer:This dictation was prepared with Dragon/digital dictation along with [employer]. Any transcriptional errors that result from this process are unintentional.  Drue Second, NP 12/22/2016

## 2016-12-22 NOTE — Progress Notes (Signed)
Oncology Nurse Navigator Documentation  Oncology Nurse Navigator Flowsheets 12/22/2016  Navigator Location [facility]  Navigator Encounter Type Treatment  Surgery Date -  Treatment Initiated Date 12/22/2016  Patient Visit Type MedOnc  Treatment Phase First Chemo Tx  Barriers/Navigation Needs Coordination of Care--requesting her appointments to be at 12:00 or later in future and asking if 1/17 visit can be moved to later in the day.  Interventions Coordination of Care--instructed her to tell MD/NP/scheduler her wishes for appointment times in the future and they will try to accommodate her her. The visit on 1/17 can't be moved to afternoon, because MD is not in the office that afternoon and he needs to be here when NP sees her.  Coordination of Care -  Support Groups/Services GI Support Group;Other--[facility] Support Calendar  Acuity Level 1  Time Spent with Patient 15  Reports her daughters (3) have been taking turns staying and checking on her. One daughter is a Marine scientist and lives across the street from her. No difficulties in home or with transportation. Has positive outlook on things, saying "you just have to deal with cards you are given and keep on pushing forward".

## 2016-12-23 ENCOUNTER — Ambulatory Visit: Payer: Medicare Other | Admitting: Oncology

## 2016-12-23 ENCOUNTER — Other Ambulatory Visit: Payer: Medicare Other

## 2016-12-23 LAB — TYPE AND SCREEN
ABO/RH(D): A POS
ANTIBODY SCREEN: POSITIVE
DAT, IgG: NEGATIVE
UNIT DIVISION: 0
Unit division: 0

## 2016-12-24 ENCOUNTER — Other Ambulatory Visit: Payer: Self-pay | Admitting: *Deleted

## 2016-12-24 ENCOUNTER — Telehealth: Payer: Self-pay | Admitting: Oncology

## 2016-12-24 MED ORDER — ONDANSETRON 8 MG PO TBDP: 8.0000 mg | ORAL_TABLET | Freq: Three times a day (TID) | ORAL | 1 refills | Status: AC | PRN

## 2016-12-24 NOTE — Telephone Encounter (Signed)
Returned call to pt, informed her Zofran refill was sent to pharmacy. Insurance will not fill until 1/15. Pt has enough to last until then.

## 2016-12-24 NOTE — Telephone Encounter (Signed)
Spoke with patient re appointments for 1/17 and 1/31 - lab for 1/31 scheduled based on order entered 1/10.   Patient request a refill for her anti nausea medication. Message given to desk nurse.

## 2016-12-29 ENCOUNTER — Other Ambulatory Visit (HOSPITAL_BASED_OUTPATIENT_CLINIC_OR_DEPARTMENT_OTHER): Payer: Medicare Other

## 2016-12-29 ENCOUNTER — Ambulatory Visit (HOSPITAL_BASED_OUTPATIENT_CLINIC_OR_DEPARTMENT_OTHER): Payer: Medicare Other | Admitting: Nurse Practitioner

## 2016-12-29 VITALS — BP 129/71 | HR 118 | Temp 98.5°F | Resp 18

## 2016-12-29 DIAGNOSIS — M549 Dorsalgia, unspecified: Secondary | ICD-10-CM | POA: Diagnosis not present

## 2016-12-29 DIAGNOSIS — D598 Other acquired hemolytic anemias: Secondary | ICD-10-CM

## 2016-12-29 DIAGNOSIS — K769 Liver disease, unspecified: Secondary | ICD-10-CM

## 2016-12-29 DIAGNOSIS — R110 Nausea: Secondary | ICD-10-CM

## 2016-12-29 DIAGNOSIS — R1907 Generalized intra-abdominal and pelvic swelling, mass and lump: Secondary | ICD-10-CM | POA: Diagnosis not present

## 2016-12-29 DIAGNOSIS — M25559 Pain in unspecified hip: Secondary | ICD-10-CM | POA: Diagnosis not present

## 2016-12-29 DIAGNOSIS — R0789 Other chest pain: Secondary | ICD-10-CM

## 2016-12-29 DIAGNOSIS — C184 Malignant neoplasm of transverse colon: Secondary | ICD-10-CM

## 2016-12-29 DIAGNOSIS — M25511 Pain in right shoulder: Secondary | ICD-10-CM | POA: Diagnosis not present

## 2016-12-29 DIAGNOSIS — M25512 Pain in left shoulder: Secondary | ICD-10-CM | POA: Diagnosis not present

## 2016-12-29 DIAGNOSIS — J479 Bronchiectasis, uncomplicated: Secondary | ICD-10-CM

## 2016-12-29 DIAGNOSIS — R109 Unspecified abdominal pain: Secondary | ICD-10-CM

## 2016-12-29 LAB — CBC WITH DIFFERENTIAL/PLATELET
BASO%: 0.5 % (ref 0.0–2.0)
BASOS ABS: 0.1 10*3/uL (ref 0.0–0.1)
EOS%: 0.5 % (ref 0.0–7.0)
Eosinophils Absolute: 0.1 10*3/uL (ref 0.0–0.5)
HCT: 25.7 % — ABNORMAL LOW (ref 34.8–46.6)
HGB: 8.9 g/dL — ABNORMAL LOW (ref 11.6–15.9)
LYMPH%: 9.5 % — ABNORMAL LOW (ref 14.0–49.7)
MCH: 32.8 pg (ref 25.1–34.0)
MCHC: 34.6 g/dL (ref 31.5–36.0)
MCV: 94.8 fL (ref 79.5–101.0)
MONO#: 1.6 10*3/uL — ABNORMAL HIGH (ref 0.1–0.9)
MONO%: 8.2 % (ref 0.0–14.0)
NEUT#: 15.8 10*3/uL — ABNORMAL HIGH (ref 1.5–6.5)
NEUT%: 81.3 % — ABNORMAL HIGH (ref 38.4–76.8)
NRBC: 0 % (ref 0–0)
Platelets: 468 10*3/uL — ABNORMAL HIGH (ref 145–400)
RBC: 2.71 10*6/uL — ABNORMAL LOW (ref 3.70–5.45)
RDW: 16.6 % — ABNORMAL HIGH (ref 11.2–14.5)
WBC: 19.4 10*3/uL — ABNORMAL HIGH (ref 3.9–10.3)
lymph#: 1.9 10*3/uL (ref 0.9–3.3)

## 2016-12-29 LAB — TECHNOLOGIST REVIEW

## 2016-12-29 MED ORDER — FENTANYL 25 MCG/HR TD PT72: 25.0000 ug | MEDICATED_PATCH | TRANSDERMAL | 0 refills | Status: DC

## 2016-12-29 MED ORDER — METOCLOPRAMIDE HCL 5 MG PO TABS: 5.0000 mg | ORAL_TABLET | Freq: Three times a day (TID) | ORAL | 0 refills | Status: DC

## 2016-12-29 MED ORDER — HYOSCYAMINE SULFATE 0.125 MG SL SUBL: 0.1250 mg | SUBLINGUAL_TABLET | SUBLINGUAL | 1 refills | Status: DC | PRN

## 2016-12-29 MED ORDER — FENTANYL 12 MCG/HR TD PT72: 12.5000 ug | MEDICATED_PATCH | TRANSDERMAL | 0 refills | Status: DC

## 2016-12-29 NOTE — Progress Notes (Addendum)
[facility] OFFICE PROGRESS NOTE   Diagnosis:  Colon cancer, anemia  INTERVAL HISTORY:   Brianna [name] returns as scheduled. She completed cycle 1 Pembrolizumab 12/22/2016. She was also transfused 2 units of blood on 12/22/2016. She reports developing chest pain/pressure during the blood transfusion. The pain has persisted. She feels it is a "bone pain". She is also having bilateral shoulder and hip pain. She is currently on a 25 g Duragesic patch. She is unable to tolerate Dilaudid. She continues to have nausea. Zofran is not effective. Appetite is poor. She thinks she has probably lost a pound or 2. She has intermittent loose stools. No dysphagia. She continues to have abdominal cramps. She takes Levsin as needed.  Objective:  Vital signs in last 24 hours:  Blood pressure 129/71, pulse (!) 118, temperature 98.5 F (36.9 C), temperature source Oral, resp. rate 18, SpO2 97 %.    HEENT: No thrush or ulcers. Resp: Lungs clear bilaterally. Cardio: Regular, tachycardic. GI: Abdomen is soft. No hepatomegaly. Tender left abdomen. Vascular: No leg edema. Neuro: Alert and oriented.  Skin: Mild jaundice.    Lab Results:  Lab Results  Component Value Date   WBC 19.4 (H) 12/29/2016   HGB 8.9 (L) 12/29/2016   HCT 25.7 (L) 12/29/2016   MCV 94.8 12/29/2016   PLT 468 (H) 12/29/2016   NEUTROABS 15.8 (H) 12/29/2016    Imaging:  No results found.  Medications: I have reviewed the patient's current medications.  Assessment/Plan: 1. Admission with fever and liver abscesses 06/19/2016  Status post placement of a liver drain on 06/21/2010, New drain placed 06/30/2016  Treated with broad-spectrum intravenous antibiotics, completed outpatient course of Augmentin   CT 07/20/2016-no residual abscess identified  CT 10/20/2016-improved appearance of the liver; no new liver lesions; apple core type lesion involving the mid transverse colon continuing for approximately 5.6  cm. No obstruction. Probable transmural disease extension especially anteriorly. Suspected enlarged nodes at the root of the transverse mesocolon; no pelvic sidewall adenopathy.  Chest CT 10/16/2017-interval development of multifocal ill-defined low attenuation lesions within the liver suspicious for recurrent abscesses.  Abdomen/pelvis CT 12/17/2016-numerous new scattered hypodense hepatic lesions. Necrotic conglomerate central mesenteric adenopathy. Questionable omental deposits of tumor. Borderline adenopathy in the retrocrural region bilaterally. Diffuse mesenteric edema.  2. 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 Hyperbilirubinemia secondary to hemolysis and liver abscesses  5. CT abdomen/pelvis 10/20/2016-improved appearance of the liver; no new liver lesions; apple core type lesion involving the mid transverse colon continuing for approximately 5.6 cm. No obstruction. Probable transmural disease extension especially anteriorly. Suspected enlarged nodes at the root of the transverse mesocolon;no pelvic sidewall adenopathy.  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 Necrotic conglomerate central mesenteric adenopathy. Questionable omental deposits of  tumor. Borderline adenopathy in the retrocrural region bilaterally. Diffuse mesenteric edema.  Cycle 1 Pembrolizumab 12/22/2016  6. 6 month history of left mid back pain with recent worsening. CT chest 12/16/2016 showed no specific etiology for the pain. Interval development of multifocal ill-defined lesions within the liver suspicious for recurrent abscesses noted. 13 mm nodular density right middle lobe likely post infectious or inflammatory. Multifocal areas of bronchiectasis.12/17/2016-the pain may be related to the mesenteric mass.   Disposition: Brianna [name] completed cycle 1 Pembrolizumab 12/22/2016. She is having persistent nausea and musculoskeletal pain. For the nausea she will try Reglan 5 mg before meals. We discussed beginning a proton pump inhibitor which she declines at present. For the pain we increased the Duragesic patch to 37.5 g every 3 days.   She was last transfused 12/22/2016. Hemoglobin today is 8.9.  She will return for labs, follow-up visit and cycle 2 Pembrolizumab in 2 weeks. She will contact the office in the interim with any problems.  Patient seen with Dr. Benay Spice. 25 minutes were spent face-to-face at today's visit with the majority of that time involved in counseling/coordination of care.     Ned Card ANP/GNP-BC   12/29/2016  11:19 AM This was a shared visit with Ned Card. Brianna [name] was interviewed and examined. The etiology of her chest discomfort remains unclear. This may be related to the upper abdominal adenopathy. She continues to have abdominal pain. We increased the Duragesic patch. She will begin a trial of Reglan.  Julieanne Manson, M.D.

## 2017-01-02 ENCOUNTER — Telehealth: Payer: Self-pay | Admitting: Oncology

## 2017-01-02 NOTE — Telephone Encounter (Signed)
S/w pt, gave appt for 1/31 @ 10.30am.

## 2017-01-04 ENCOUNTER — Telehealth: Payer: Self-pay | Admitting: *Deleted

## 2017-01-04 NOTE — Telephone Encounter (Signed)
Ok to continue home nursing

## 2017-01-04 NOTE — Telephone Encounter (Signed)
"  [place] [phone].  This patient has been monitored once a week after surgery.  The order is running out.   1. We'd like to continue service but need a new order from Dr Benay Spice.   2. We also have noticed redness around the coccyx area.  Skin is intact.  Stage 1 pressure sure and would like an order for Duo derm to apply to this area."

## 2017-01-04 NOTE — Telephone Encounter (Signed)
Called Brianna [name] to provided Dr. Gearldine Shown order.  No further needs or questions.

## 2017-01-09 ENCOUNTER — Other Ambulatory Visit: Payer: Self-pay | Admitting: Oncology

## 2017-01-10 ENCOUNTER — Encounter: Payer: Self-pay | Admitting: *Deleted

## 2017-01-12 ENCOUNTER — Other Ambulatory Visit: Payer: Self-pay | Admitting: *Deleted

## 2017-01-12 ENCOUNTER — Other Ambulatory Visit: Payer: Medicare Other

## 2017-01-12 ENCOUNTER — Ambulatory Visit (HOSPITAL_BASED_OUTPATIENT_CLINIC_OR_DEPARTMENT_OTHER): Payer: Medicare Other

## 2017-01-12 ENCOUNTER — Other Ambulatory Visit (HOSPITAL_BASED_OUTPATIENT_CLINIC_OR_DEPARTMENT_OTHER): Payer: Medicare Other

## 2017-01-12 ENCOUNTER — Ambulatory Visit (HOSPITAL_BASED_OUTPATIENT_CLINIC_OR_DEPARTMENT_OTHER): Payer: Medicare Other | Admitting: Oncology

## 2017-01-12 VITALS — BP 121/50 | HR 78 | Temp 98.6°F | Resp 18 | Ht 63.0 in | Wt 101.1 lb

## 2017-01-12 DIAGNOSIS — Z5112 Encounter for antineoplastic immunotherapy: Secondary | ICD-10-CM

## 2017-01-12 DIAGNOSIS — C184 Malignant neoplasm of transverse colon: Secondary | ICD-10-CM

## 2017-01-12 DIAGNOSIS — R0789 Other chest pain: Secondary | ICD-10-CM

## 2017-01-12 DIAGNOSIS — J479 Bronchiectasis, uncomplicated: Secondary | ICD-10-CM | POA: Diagnosis not present

## 2017-01-12 DIAGNOSIS — D598 Other acquired hemolytic anemias: Secondary | ICD-10-CM | POA: Diagnosis not present

## 2017-01-12 DIAGNOSIS — Z79899 Other long term (current) drug therapy: Secondary | ICD-10-CM | POA: Diagnosis not present

## 2017-01-12 DIAGNOSIS — K769 Liver disease, unspecified: Secondary | ICD-10-CM

## 2017-01-12 LAB — COMPREHENSIVE METABOLIC PANEL
ALBUMIN: 3.7 g/dL (ref 3.5–5.0)
ALK PHOS: 135 U/L (ref 40–150)
ALT: 17 U/L (ref 0–55)
ANION GAP: 7 meq/L (ref 3–11)
AST: 18 U/L (ref 5–34)
BILIRUBIN TOTAL: 2.3 mg/dL — AB (ref 0.20–1.20)
BUN: 15 mg/dL (ref 7.0–26.0)
CO2: 29 meq/L (ref 22–29)
CREATININE: 0.7 mg/dL (ref 0.6–1.1)
Calcium: 9.6 mg/dL (ref 8.4–10.4)
Chloride: 96 mEq/L — ABNORMAL LOW (ref 98–109)
EGFR: 77 mL/min/{1.73_m2} — ABNORMAL LOW (ref >90)
GLUCOSE: 107 mg/dL (ref 70–140)
Potassium: 4.7 mEq/L (ref 3.5–5.1)
SODIUM: 132 meq/L — AB (ref 136–145)
Total Protein: 7.4 g/dL (ref 6.4–8.3)

## 2017-01-12 LAB — CBC WITH DIFFERENTIAL/PLATELET
BASO%: 0.8 % (ref 0.0–2.0)
BASOS ABS: 0.1 10*3/uL (ref 0.0–0.1)
EOS ABS: 0.1 10*3/uL (ref 0.0–0.5)
EOS%: 0.4 % (ref 0.0–7.0)
HCT: 28.3 % — ABNORMAL LOW (ref 34.8–46.6)
HEMOGLOBIN: 9.5 g/dL — AB (ref 11.6–15.9)
LYMPH#: 1.5 10*3/uL (ref 0.9–3.3)
LYMPH%: 12.5 % — ABNORMAL LOW (ref 14.0–49.7)
MCH: 33.2 pg (ref 25.1–34.0)
MCHC: 33.6 g/dL (ref 31.5–36.0)
MCV: 99 fL (ref 79.5–101.0)
MONO#: 0.7 10*3/uL (ref 0.1–0.9)
MONO%: 6 % (ref 0.0–14.0)
NEUT%: 80.3 % — ABNORMAL HIGH (ref 38.4–76.8)
NEUTROS ABS: 9.5 10*3/uL — AB (ref 1.5–6.5)
NRBC: 0 % (ref 0–0)
PLATELETS: 496 10*3/uL — AB (ref 145–400)
RBC: 2.86 10*6/uL — AB (ref 3.70–5.45)
RDW: 15.6 % — AB (ref 11.2–14.5)
WBC: 11.8 10*3/uL — AB (ref 3.9–10.3)

## 2017-01-12 LAB — TECHNOLOGIST REVIEW

## 2017-01-12 LAB — TSH: TSH: 1.219 m(IU)/L (ref 0.308–3.960)

## 2017-01-12 MED ORDER — SODIUM CHLORIDE 0.9 % IV SOLN
Freq: Once | INTRAVENOUS | Status: AC
  Administered 2017-01-12: 13:00:00 via INTRAVENOUS

## 2017-01-12 MED ORDER — SODIUM CHLORIDE 0.9 % IV SOLN
200.0000 mg | Freq: Once | INTRAVENOUS | Status: AC
  Administered 2017-01-12: 200 mg via INTRAVENOUS
  Filled 2017-01-12: qty 8

## 2017-01-12 MED ORDER — FENTANYL 25 MCG/HR TD PT72: 25.0000 ug | MEDICATED_PATCH | TRANSDERMAL | 0 refills | Status: DC

## 2017-01-12 NOTE — Progress Notes (Signed)
[facility] OFFICE PROGRESS NOTE   Diagnosis: Colon cancer, cold agglutinin disease  INTERVAL HISTORY:   Brianna [name] returns as scheduled. She reports the abdomen and back pain has completely resolved. She remains on 837.5 g Duragesic patch. She is not taking other pain medication. She reports constipation. Her appetite and energy level have improved. She has pruritus. No rash. She has mild discomfort at the right low anterior chest with inspiration.  Objective:  Vital signs in last 24 hours:  Blood pressure (!) 121/50, pulse 78, temperature 98.6 F (37 C), temperature source Oral, resp. rate 18, height 5' 3"  (1.6 m), weight 101 lb 1.6 oz (45.9 kg), SpO2 100 %.    HEENT: No thrush or ulcers, mild scleral icterus Resp: Lungs clear bilaterally, no respiratory distress Cardio: Regular rate and rhythm GI: No hepatomegaly, nontender Vascular: No leg edema    Lab Results:  Lab Results  Component Value Date   WBC 11.8 (H) 01/12/2017   HGB 9.5 (L) 01/12/2017   HCT 28.3 (L) 01/12/2017   MCV 99.0 01/12/2017   PLT 496 (H) 01/12/2017   NEUTROABS 9.5 (H) 01/12/2017     Medications: I have reviewed the patient's current medications.  Assessment/Plan: 1. Admission with fever and liver abscesses 06/19/2016  Status post placement of a liver drain on 06/21/2010, New drain placed 06/30/2016  Treated with broad-spectrum intravenous antibiotics, completed outpatient course of Augmentin   CT 07/20/2016-no residual abscess identified  CT 10/20/2016-improved appearance of the liver; no new liver lesions; apple core type lesion involving the mid transverse colon continuing for approximately 5.6 cm. No obstruction. Probable transmural disease extension especially anteriorly. Suspected enlarged nodes at the root of the transverse mesocolon; no pelvic sidewall adenopathy.  Chest CT 10/16/2017-interval development of multifocal ill-defined low attenuation lesions within the  liver suspicious for recurrent abscesses.  Abdomen/pelvis CT 12/17/2016-numerous new scattered hypodense hepatic lesions. Necrotic conglomerate central mesenteric adenopathy. Questionable omental deposits of tumor. Borderline adenopathy in the retrocrural region bilaterally. Diffuse mesenteric edema.  2. 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 Hyperbilirubinemia secondary to hemolysis and liver abscesses  5. CT abdomen/pelvis 10/20/2016-improved appearance of the liver; no new liver lesions; apple core type lesion involving the mid transverse colon continuing for approximately 5.6 cm. No obstruction. Probable transmural disease extension especially anteriorly. Suspected enlarged nodes at the root of the transverse mesocolon;no pelvic sidewall adenopathy.  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 Necrotic conglomerate central mesenteric adenopathy. Questionable omental deposits of tumor. Borderline adenopathy in the retrocrural  region bilaterally. Diffuse mesenteric edema.  Cycle 1 Pembrolizumab 12/22/2016  Cycle 2 Pembrolizumab 01/12/2017  6. 6 month history of left mid back pain with recent worsening. CT chest 12/16/2016 showed no specific etiology for the pain. Interval development  of multifocal ill-defined lesions within the liver suspicious for recurrent abscesses noted. 13 mm nodular density right middle lobe likely post infectious or inflammatory. Multifocal areas of bronchiectasis.12/17/2016-the pain may be related to the mesenteric mass.     Disposition:  Brianna [name] has a much improved performance status. Her pain is much better. We decreased the Duragesic patch 25 g. She will contact us within the next one to 2 weeks and we will wean the Duragesic further if the pain remains improved.  The hemoglobin is improved compared to when she was here on 12/29/2016.  She will complete cycle 2 pembrolizumab Today.  Brianna [name] will return for an office visit and treatment in 3 weeks.  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  The plan is to complete 3-4 cycles of immunotherapy prior to a restaging CT evaluation.  Betsy Coder, MD  01/12/2017  12:20 PM

## 2017-01-12 NOTE — Patient Instructions (Signed)
[facility] Discharge Instructions for Patients Receiving Chemotherapy  Today you received the following chemotherapy agents: Keytruda.  To help prevent nausea and vomiting after your treatment, we encourage you to take your nausea medication If you develop nausea and vomiting that is not controlled by your nausea medication, call the clinic.   BELOW ARE SYMPTOMS THAT SHOULD BE REPORTED IMMEDIATELY:  *FEVER GREATER THAN 100.5 F  *CHILLS WITH OR WITHOUT FEVER  NAUSEA AND VOMITING THAT IS NOT CONTROLLED WITH YOUR NAUSEA MEDICATION  *UNUSUAL SHORTNESS OF BREATH  *UNUSUAL BRUISING OR BLEEDING  TENDERNESS IN MOUTH AND THROAT WITH OR WITHOUT PRESENCE OF ULCERS  *URINARY PROBLEMS  *BOWEL PROBLEMS  UNUSUAL RASH Items with * indicate a potential emergency and should be followed up as soon as possible.  Feel free to call the clinic you have any questions or concerns. The clinic phone number is [phone].  Please show the [facility] at check-in to the Emergency Department and triage nurse.    Pembrolizumab injection What is this medicine? PEMBROLIZUMAB (pem broe liz ue mab) is a monoclonal antibody. It is used to treat melanoma, head and neck cancer, Hodgkin lymphoma, and non-small cell lung cancer. COMMON BRAND NAME(S): Keytruda What should I tell my health care provider before I take this medicine? They need to know if you have any of these conditions: -diabetes -immune system problems -inflammatory bowel disease -liver disease -lung or breathing disease -lupus -organ transplant -an unusual or allergic reaction to pembrolizumab, other medicines, foods, dyes, or preservatives -pregnant or trying to get pregnant -breast-feeding How should I use this medicine? This medicine is for infusion into a vein. It is given by a health care professional in a hospital or clinic setting. A special MedGuide will be given to you before each treatment. Be sure to  read this information carefully each time. Talk to your pediatrician regarding the use of this medicine in children. While this drug may be prescribed for selected conditions, precautions do apply. What if I miss a dose? It is important not to miss your dose. Call your doctor or health care professional if you are unable to keep an appointment. What may interact with this medicine? Interactions have not been studied. Give your health care provider a list of all the medicines, herbs, non-prescription drugs, or dietary supplements you use. Also tell them if you smoke, drink alcohol, or use illegal drugs. Some items may interact with your medicine. What should I watch for while using this medicine? Your condition will be monitored carefully while you are receiving this medicine. You may need blood work done while you are taking this medicine. Do not become pregnant while taking this medicine or for 4 months after stopping it. Women should inform their doctor if they wish to become pregnant or think they might be pregnant. There is a potential for serious side effects to an unborn child. Talk to your health care professional or pharmacist for more information. Do not breast-feed an infant while taking this medicine or for 4 months after the last dose. What side effects may I notice from receiving this medicine? Side effects that you should report to your doctor or health care professional as soon as possible: -allergic reactions like skin rash, itching or hives, swelling of the face, lips, or tongue -bloody or black, tarry -breathing problems -changes in vision -chest pain -chills -constipation -cough -dizziness or feeling faint or lightheaded -fast or irregular heartbeat -fever -flushing -hair loss -low blood counts -  this medicine may decrease the number of white blood cells, red blood cells and platelets. You may be at increased risk for infections and bleeding. -muscle pain -muscle  weakness -persistent headache -signs and symptoms of high blood sugar such as dizziness; dry mouth; dry skin; fruity breath; nausea; stomach pain; increased hunger or thirst; increased urination -signs and symptoms of kidney injury like trouble passing urine or change in the amount of urine -signs and symptoms of liver injury like dark urine, light-colored stools, loss of appetite, nausea, right upper belly pain, yellowing of the eyes or skin -stomach pain -sweating -weight loss Side effects that usually do not require medical attention (report to your doctor or health care professional if they continue or are bothersome): -decreased appetite -diarrhea -tiredness Where should I keep my medicine? This drug is given in a hospital or clinic and will not be stored at home.  2017 Elsevier/Gold Standard (2016-03-01 19:28:44)   Blood Transfusion , Adult A blood transfusion is a procedure in which you receive donated blood, including plasma, platelets, and red blood cells, through an IV tube. You may need a blood transfusion because of illness, surgery, or injury. The blood may come from a donor. You may also be able to donate blood for yourself (autologous blood donation) before a surgery if you know that you might require a blood transfusion. The blood given in a transfusion is made up of different types of cells. You may receive:  Red blood cells. These carry oxygen to the cells in the body.  White blood cells. These help you fight infections.  Platelets. These help your blood to clot.  Plasma. This is the liquid part of your blood and it helps with fluid imbalances. If you have hemophilia or another clotting disorder, you may also receive other types of blood products. Tell a health care provider about:  Any allergies you have.  All medicines you are taking, including vitamins, herbs, eye drops, creams, and over-the-counter medicines.  Any problems you or family members have had with  anesthetic medicines.  Any blood disorders you have.  Any surgeries you have had.  Any medical conditions you have, including any recent fever or cold symptoms.  Whether you are pregnant or may be pregnant.  Any previous reactions you have had during a blood transfusion. What are the risks? Generally, this is a safe procedure. However, problems may occur, including:  Having an allergic reaction to something in the donated blood. Hives and itching may be symptoms of this type of reaction.  Fever. This may be a reaction to the white blood cells in the transfused blood. Nausea or chest pain may accompany a fever.  Iron overload. This can happen from having many transfusions.  Transfusion-related acute lung injury (TRALI). This is a rare reaction that causes lung damage. The cause is not known.TRALI can occur within hours of a transfusion or several days later.  Sudden (acute) or delayed hemolytic reactions. This happens if your blood does not match the cells in your transfusion. Your body's defense system (immune system) may try to attack the new cells. This complication is rare. The symptoms include fever, chills, nausea, and low back pain or chest pain.  Infection or disease transmission. This is rare. What happens before the procedure?  You will have a blood test to determine your blood type. This is necessary to know what kind of blood your body will accept and to match it to the donor blood.  If you are going  to have a planned surgery, you may be able to do an autologous blood donation. This may be done in case you need to have a transfusion.  If you have had an allergic reaction to a transfusion in the past, you may be given medicine to help prevent a reaction. This medicine may be given to you by mouth or through an IV tube.  You will have your temperature, blood pressure, and pulse monitored before the transfusion.  Follow instructions from your health care provider about  eating and drinking restrictions.  Ask your health care provider about:  Changing or stopping your regular medicines. This is especially important if you are taking diabetes medicines or blood thinners.  Taking medicines such as aspirin and ibuprofen. These medicines can thin your blood. Do not take these medicines before your procedure if your health care provider instructs you not to. What happens during the procedure?  An IV tube will be inserted into one of your veins.  The bag of donated blood will be attached to your IV tube. The blood will then enter through your vein.  Your temperature, blood pressure, and pulse will be monitored regularly during the transfusion. This monitoring is done to detect early signs of a transfusion reaction.  If you have any signs or symptoms of a reaction, your transfusion will be stopped and you may be given medicine.  When the transfusion is complete, your IV tube will be removed.  Pressure may be applied to the IV site for a few minutes.  A bandage (dressing) will be applied. The procedure may vary among health care providers and hospitals. What happens after the procedure?  Your temperature, blood pressure, heart rate, breathing rate, and blood oxygen level will be monitored often.  Your blood may be tested to see how you are responding to the transfusion.  You may be warmed with fluids or blankets to maintain a normal body temperature. Summary  A blood transfusion is a procedure in which you receive donated blood, including plasma, platelets, and red blood cells, through an IV tube.  Your temperature, blood pressure, and pulse will be monitored before, during, and after the transfusion.  Your blood may be tested after the transfusion to see how your body has responded. This information is not intended to replace advice given to you by your health care provider. Make sure you discuss any questions you have with your health care  provider. Document Released: 11/26/2000 Document Revised: 08/26/2016 Document Reviewed: 08/26/2016 Elsevier Interactive Patient Education  2017 [employer].

## 2017-01-17 ENCOUNTER — Telehealth: Payer: Self-pay | Admitting: *Deleted

## 2017-01-17 NOTE — Telephone Encounter (Signed)
Per 1/31 LOS and staff message I have scheduled appt and notified the scheduler

## 2017-01-24 ENCOUNTER — Telehealth: Payer: Self-pay | Admitting: *Deleted

## 2017-01-24 NOTE — Telephone Encounter (Signed)
Call placed back to patient to review her schedule with her for 2/21/8.  Patient appreciative of call back and has no further questions at this time.

## 2017-01-24 NOTE — Telephone Encounter (Signed)
Message received from patient with questions regarding her appt scheduled of 02/02/17.

## 2017-01-26 ENCOUNTER — Telehealth: Payer: Self-pay | Admitting: *Deleted

## 2017-01-26 NOTE — Telephone Encounter (Signed)
Return call placed to [place], physical therapist with [facility] to notify her per order of Dr. Benay Spice that it is ok to continue home physical therapy for an additional four weeks.  Instructed Janine to call [place] back with any questions.

## 2017-01-30 ENCOUNTER — Other Ambulatory Visit: Payer: Self-pay | Admitting: Oncology

## 2017-02-01 ENCOUNTER — Telehealth: Payer: Self-pay | Admitting: *Deleted

## 2017-02-01 NOTE — Telephone Encounter (Signed)
Message from pt reporting she is "very sick" and needs to cancel 2/21 appts. Returned call, she reports "horrendous cough" she was given Mucinex and nasal spray at [facility]. Pt went back to the walk in clinic when temp went up to 101. She reports chest Xray was negative for pneumonia but was given a Rocephin injection, Levaquin and Tessalon prescriptions. Provider told her Dr. Benay Spice may want to cancel treatment this week.

## 2017-02-02 ENCOUNTER — Telehealth: Payer: Self-pay | Admitting: Oncology

## 2017-02-02 ENCOUNTER — Other Ambulatory Visit: Payer: Medicare Other

## 2017-02-02 ENCOUNTER — Ambulatory Visit: Payer: Medicare Other

## 2017-02-02 ENCOUNTER — Ambulatory Visit: Payer: Medicare Other | Admitting: Nurse Practitioner

## 2017-02-02 NOTE — Telephone Encounter (Signed)
R/s and confirmed appt 2/28 at 0900 per LOS

## 2017-02-03 NOTE — Telephone Encounter (Signed)
Called pt for update, she reports she was seen at [facility] office. No longer having fevers. Pt is hopeful she will be well enough to receive treatment next week. Received refill request from [facility] for Reglan. Confirmed with pt she is no longer taking this med. Refill denied.

## 2017-02-09 ENCOUNTER — Encounter: Payer: Self-pay | Admitting: *Deleted

## 2017-02-09 ENCOUNTER — Ambulatory Visit (HOSPITAL_BASED_OUTPATIENT_CLINIC_OR_DEPARTMENT_OTHER): Payer: Medicare Other

## 2017-02-09 ENCOUNTER — Other Ambulatory Visit (HOSPITAL_BASED_OUTPATIENT_CLINIC_OR_DEPARTMENT_OTHER): Payer: Medicare Other

## 2017-02-09 ENCOUNTER — Ambulatory Visit (HOSPITAL_BASED_OUTPATIENT_CLINIC_OR_DEPARTMENT_OTHER): Payer: Medicare Other | Admitting: Oncology

## 2017-02-09 VITALS — BP 118/71 | HR 78 | Temp 98.2°F | Resp 17 | Wt 103.0 lb

## 2017-02-09 DIAGNOSIS — Z5112 Encounter for antineoplastic immunotherapy: Secondary | ICD-10-CM | POA: Diagnosis present

## 2017-02-09 DIAGNOSIS — C184 Malignant neoplasm of transverse colon: Secondary | ICD-10-CM | POA: Diagnosis present

## 2017-02-09 DIAGNOSIS — D591 Other autoimmune hemolytic anemias: Principal | ICD-10-CM

## 2017-02-09 DIAGNOSIS — K769 Liver disease, unspecified: Secondary | ICD-10-CM | POA: Diagnosis not present

## 2017-02-09 DIAGNOSIS — J479 Bronchiectasis, uncomplicated: Secondary | ICD-10-CM

## 2017-02-09 DIAGNOSIS — D5912 Cold autoimmune hemolytic anemia: Secondary | ICD-10-CM

## 2017-02-09 LAB — COMPREHENSIVE METABOLIC PANEL
ALT: 16 U/L (ref 0–55)
ANION GAP: 8 meq/L (ref 3–11)
AST: 17 U/L (ref 5–34)
Albumin: 3.5 g/dL (ref 3.5–5.0)
Alkaline Phosphatase: 74 U/L (ref 40–150)
BUN: 18.4 mg/dL (ref 7.0–26.0)
CALCIUM: 9.3 mg/dL (ref 8.4–10.4)
CO2: 26 meq/L (ref 22–29)
Chloride: 100 mEq/L (ref 98–109)
Creatinine: 0.7 mg/dL (ref 0.6–1.1)
EGFR: 76 mL/min/{1.73_m2} — ABNORMAL LOW (ref >90)
Glucose: 96 mg/dl (ref 70–140)
Potassium: 4.4 mEq/L (ref 3.5–5.1)
Sodium: 134 mEq/L — ABNORMAL LOW (ref 136–145)
TOTAL PROTEIN: 7 g/dL (ref 6.4–8.3)
Total Bilirubin: 1.62 mg/dL — ABNORMAL HIGH (ref 0.20–1.20)

## 2017-02-09 LAB — CBC WITH DIFFERENTIAL/PLATELET
BASO%: 0.8 % (ref 0.0–2.0)
Basophils Absolute: 0.1 10*3/uL (ref 0.0–0.1)
EOS ABS: 0.2 10*3/uL (ref 0.0–0.5)
EOS%: 1.3 % (ref 0.0–7.0)
HEMATOCRIT: 29.9 % — AB (ref 34.8–46.6)
HEMOGLOBIN: 10.3 g/dL — AB (ref 11.6–15.9)
LYMPH#: 2.7 10*3/uL (ref 0.9–3.3)
LYMPH%: 22 % (ref 14.0–49.7)
MCH: 33.3 pg (ref 25.1–34.0)
MCHC: 34.4 g/dL (ref 31.5–36.0)
MCV: 96.8 fL (ref 79.5–101.0)
MONO#: 1.1 10*3/uL — AB (ref 0.1–0.9)
MONO%: 8.8 % (ref 0.0–14.0)
NEUT%: 67.1 % (ref 38.4–76.8)
NEUTROS ABS: 8.2 10*3/uL — AB (ref 1.5–6.5)
NRBC: 0 % (ref 0–0)
PLATELETS: 437 10*3/uL — AB (ref 145–400)
RBC: 3.09 10*6/uL — AB (ref 3.70–5.45)
RDW: 14.1 % (ref 11.2–14.5)
WBC: 12.2 10*3/uL — AB (ref 3.9–10.3)

## 2017-02-09 LAB — TECHNOLOGIST REVIEW

## 2017-02-09 MED ORDER — SODIUM CHLORIDE 0.9 % IV SOLN
Freq: Once | INTRAVENOUS | Status: AC
  Administered 2017-02-09: 11:00:00 via INTRAVENOUS

## 2017-02-09 MED ORDER — SODIUM CHLORIDE 0.9 % IV SOLN
200.0000 mg | Freq: Once | INTRAVENOUS | Status: AC
  Administered 2017-02-09: 200 mg via INTRAVENOUS
  Filled 2017-02-09: qty 8

## 2017-02-09 NOTE — Progress Notes (Signed)
Okay to treat with bilirubin 1.62 per MD. Cindie Crumbly nurse informed

## 2017-02-09 NOTE — Patient Instructions (Signed)
[facility] Discharge Instructions for Patients Receiving Chemotherapy  Today you received the following chemotherapy agents: Keytruda.  To help prevent nausea and vomiting after your treatment, we encourage you to take your nausea medication If you develop nausea and vomiting that is not controlled by your nausea medication, call the clinic.   BELOW ARE SYMPTOMS THAT SHOULD BE REPORTED IMMEDIATELY:  *FEVER GREATER THAN 100.5 F  *CHILLS WITH OR WITHOUT FEVER  NAUSEA AND VOMITING THAT IS NOT CONTROLLED WITH YOUR NAUSEA MEDICATION  *UNUSUAL SHORTNESS OF BREATH  *UNUSUAL BRUISING OR BLEEDING  TENDERNESS IN MOUTH AND THROAT WITH OR WITHOUT PRESENCE OF ULCERS  *URINARY PROBLEMS  *BOWEL PROBLEMS  UNUSUAL RASH Items with * indicate a potential emergency and should be followed up as soon as possible.  Feel free to call the clinic you have any questions or concerns. The clinic phone number is [phone].  Please show the [facility] at check-in to the Emergency Department and triage nurse.    Pembrolizumab injection What is this medicine? PEMBROLIZUMAB (pem broe liz ue mab) is a monoclonal antibody. It is used to treat melanoma, head and neck cancer, Hodgkin lymphoma, and non-small cell lung cancer. COMMON BRAND NAME(S): Keytruda What should I tell my health care provider before I take this medicine? They need to know if you have any of these conditions: -diabetes -immune system problems -inflammatory bowel disease -liver disease -lung or breathing disease -lupus -organ transplant -an unusual or allergic reaction to pembrolizumab, other medicines, foods, dyes, or preservatives -pregnant or trying to get pregnant -breast-feeding How should I use this medicine? This medicine is for infusion into a vein. It is given by a health care professional in a hospital or clinic setting. A special MedGuide will be given to you before each treatment. Be sure to  read this information carefully each time. Talk to your pediatrician regarding the use of this medicine in children. While this drug may be prescribed for selected conditions, precautions do apply. What if I miss a dose? It is important not to miss your dose. Call your doctor or health care professional if you are unable to keep an appointment. What may interact with this medicine? Interactions have not been studied. Give your health care provider a list of all the medicines, herbs, non-prescription drugs, or dietary supplements you use. Also tell them if you smoke, drink alcohol, or use illegal drugs. Some items may interact with your medicine. What should I watch for while using this medicine? Your condition will be monitored carefully while you are receiving this medicine. You may need blood work done while you are taking this medicine. Do not become pregnant while taking this medicine or for 4 months after stopping it. Women should inform their doctor if they wish to become pregnant or think they might be pregnant. There is a potential for serious side effects to an unborn child. Talk to your health care professional or pharmacist for more information. Do not breast-feed an infant while taking this medicine or for 4 months after the last dose. What side effects may I notice from receiving this medicine? Side effects that you should report to your doctor or health care professional as soon as possible: -allergic reactions like skin rash, itching or hives, swelling of the face, lips, or tongue -bloody or black, tarry -breathing problems -changes in vision -chest pain -chills -constipation -cough -dizziness or feeling faint or lightheaded -fast or irregular heartbeat -fever -flushing -hair loss -low blood counts -  this medicine may decrease the number of white blood cells, red blood cells and platelets. You may be at increased risk for infections and bleeding. -muscle pain -muscle  weakness -persistent headache -signs and symptoms of high blood sugar such as dizziness; dry mouth; dry skin; fruity breath; nausea; stomach pain; increased hunger or thirst; increased urination -signs and symptoms of kidney injury like trouble passing urine or change in the amount of urine -signs and symptoms of liver injury like dark urine, light-colored stools, loss of appetite, nausea, right upper belly pain, yellowing of the eyes or skin -stomach pain -sweating -weight loss Side effects that usually do not require medical attention (report to your doctor or health care professional if they continue or are bothersome): -decreased appetite -diarrhea -tiredness Where should I keep my medicine? This drug is given in a hospital or clinic and will not be stored at home.  2017 Elsevier/Gold Standard (2016-03-01 19:28:44)   Blood Transfusion , Adult A blood transfusion is a procedure in which you receive donated blood, including plasma, platelets, and red blood cells, through an IV tube. You may need a blood transfusion because of illness, surgery, or injury. The blood may come from a donor. You may also be able to donate blood for yourself (autologous blood donation) before a surgery if you know that you might require a blood transfusion. The blood given in a transfusion is made up of different types of cells. You may receive:  Red blood cells. These carry oxygen to the cells in the body.  White blood cells. These help you fight infections.  Platelets. These help your blood to clot.  Plasma. This is the liquid part of your blood and it helps with fluid imbalances. If you have hemophilia or another clotting disorder, you may also receive other types of blood products. Tell a health care provider about:  Any allergies you have.  All medicines you are taking, including vitamins, herbs, eye drops, creams, and over-the-counter medicines.  Any problems you or family members have had with  anesthetic medicines.  Any blood disorders you have.  Any surgeries you have had.  Any medical conditions you have, including any recent fever or cold symptoms.  Whether you are pregnant or may be pregnant.  Any previous reactions you have had during a blood transfusion. What are the risks? Generally, this is a safe procedure. However, problems may occur, including:  Having an allergic reaction to something in the donated blood. Hives and itching may be symptoms of this type of reaction.  Fever. This may be a reaction to the white blood cells in the transfused blood. Nausea or chest pain may accompany a fever.  Iron overload. This can happen from having many transfusions.  Transfusion-related acute lung injury (TRALI). This is a rare reaction that causes lung damage. The cause is not known.TRALI can occur within hours of a transfusion or several days later.  Sudden (acute) or delayed hemolytic reactions. This happens if your blood does not match the cells in your transfusion. Your body's defense system (immune system) may try to attack the new cells. This complication is rare. The symptoms include fever, chills, nausea, and low back pain or chest pain.  Infection or disease transmission. This is rare. What happens before the procedure?  You will have a blood test to determine your blood type. This is necessary to know what kind of blood your body will accept and to match it to the donor blood.  If you are going  to have a planned surgery, you may be able to do an autologous blood donation. This may be done in case you need to have a transfusion.  If you have had an allergic reaction to a transfusion in the past, you may be given medicine to help prevent a reaction. This medicine may be given to you by mouth or through an IV tube.  You will have your temperature, blood pressure, and pulse monitored before the transfusion.  Follow instructions from your health care provider about  eating and drinking restrictions.  Ask your health care provider about:  Changing or stopping your regular medicines. This is especially important if you are taking diabetes medicines or blood thinners.  Taking medicines such as aspirin and ibuprofen. These medicines can thin your blood. Do not take these medicines before your procedure if your health care provider instructs you not to. What happens during the procedure?  An IV tube will be inserted into one of your veins.  The bag of donated blood will be attached to your IV tube. The blood will then enter through your vein.  Your temperature, blood pressure, and pulse will be monitored regularly during the transfusion. This monitoring is done to detect early signs of a transfusion reaction.  If you have any signs or symptoms of a reaction, your transfusion will be stopped and you may be given medicine.  When the transfusion is complete, your IV tube will be removed.  Pressure may be applied to the IV site for a few minutes.  A bandage (dressing) will be applied. The procedure may vary among health care providers and hospitals. What happens after the procedure?  Your temperature, blood pressure, heart rate, breathing rate, and blood oxygen level will be monitored often.  Your blood may be tested to see how you are responding to the transfusion.  You may be warmed with fluids or blankets to maintain a normal body temperature. Summary  A blood transfusion is a procedure in which you receive donated blood, including plasma, platelets, and red blood cells, through an IV tube.  Your temperature, blood pressure, and pulse will be monitored before, during, and after the transfusion.  Your blood may be tested after the transfusion to see how your body has responded. This information is not intended to replace advice given to you by your health care provider. Make sure you discuss any questions you have with your health care  provider. Document Released: 11/26/2000 Document Revised: 08/26/2016 Document Reviewed: 08/26/2016 Elsevier Interactive Patient Education  2017 [employer].

## 2017-02-09 NOTE — Progress Notes (Signed)
[facility] OFFICE PROGRESS NOTE  Diagnosis:Colon cancer   INTERVAL HISTORY:   Ms. Steinmeyer completed another treatment with pembrolizumab 01/12/2017. No rash or diarrhea.  No pain. Good appetite an energy level.  Soreness at the sacrum.  Objective:  Vital signs in last 24 hours:  Blood pressure 118/71, pulse 78, temperature 98.2 F (36.8 C), temperature source Oral, resp. rate 17, weight 103 lb (46.7 kg), SpO2 96 %.    HEENT: no thrush or ulcers Resp: lungs clear Cardio: rrr GI: no hsm, nontender, no mass Vascular: no leg edema  Skin: mild erythema at the sacrum without skin breakdown     Lab Results:  Lab Results  Component Value Date   WBC 12.2 (H) 02/09/2017   HGB 10.3 (L) 02/09/2017   HCT 29.9 (L) 02/09/2017   MCV 96.8 02/09/2017   PLT 437 (H) 02/09/2017   NEUTROABS 8.2 (H) 02/09/2017     Medications: I have reviewed the patient's current medications.  Assessment/Plan: 1. Admission with fever and liver abscesses 06/19/2016  Status post placement of a liver drain on 06/21/2010, New drain placed 06/30/2016  Treated with broad-spectrum intravenous antibiotics, completed outpatient course of Augmentin   CT 07/20/2016-no residual abscess identified  CT 10/20/2016-improved appearance of the liver; no new liver lesions; apple core type lesion involving the mid transverse colon continuing for approximately 5.6 cm. No obstruction. Probable transmural disease extension especially anteriorly. Suspected enlarged nodes at the root of the transverse mesocolon; no pelvic sidewall adenopathy.  Chest CT 10/16/2017-interval development of multifocal ill-defined low attenuation lesions within the liver suspicious for recurrent abscesses.  Abdomen/pelvis CT 12/17/2016-numerous new scattered hypodense hepatic lesions. Necrotic conglomerate central mesenteric adenopathy. Questionable omental deposits of tumor. Borderline adenopathy in the retrocrural region  bilaterally. Diffuse mesenteric edema.  2. 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 Hyperbilirubinemia secondary to hemolysis and liver abscesses  5. CT abdomen/pelvis 10/20/2016-improved appearance of the liver; no new liver lesions; apple core type lesion involving the mid transverse colon continuing for approximately 5.6 cm. No obstruction. Probable transmural disease extension especially anteriorly. Suspected enlarged nodes at the root of the transverse mesocolon;no pelvic sidewall adenopathy.  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 Necrotic conglomerate central mesenteric adenopathy. Questionable omental deposits of tumor. Borderline adenopathy in the retrocrural region bilaterally. Diffuse mesenteric edema.  Cycle 1 Pembrolizumab 12/22/2016  Cycle 2 Pembrolizumab 01/12/2017  Cycle 3 Pembrolizumab 02/09/2017  6. 6 month history of left mid back pain with recent worsening. CT chest 12/16/2016 showed no specific  etiology for the pain. Interval development of multifocal ill-defined lesions within the liver suspicious for recurrent abscesses noted. 13 mm nodular density right middle lobe likely post infectious or inflammatory. Multifocal areas of bronchiectasis.12/17/2016-the pain may be related to the mesenteric  mass. Pain resolved with Pembrolizumab treatment    Disposition:  Ms. Cogburn appears well. The plan is to proceed with Pembrolizumab today.  She will stop the duragesic after 1 more 12.5ug patch. She will return for an office visit in 3 weeks She will try to decrease pressure on the sacrum.  Betsy Coder, MD  02/09/2017  10:27 AM

## 2017-02-09 NOTE — Progress Notes (Signed)
Signed MD orders faxed to [facility] for home nursing visits to evaluate pressure area.

## 2017-02-15 ENCOUNTER — Telehealth: Payer: Self-pay | Admitting: *Deleted

## 2017-02-15 NOTE — Telephone Encounter (Signed)
Message from pt reporting she has not used a Fentanyl patch for past 2.5 days. Asks if it is OK to have an occasional glass of wine with dinner. Reviewed with Ned Card, NP: YES. Pt made aware.

## 2017-02-27 ENCOUNTER — Other Ambulatory Visit: Payer: Self-pay | Admitting: Oncology

## 2017-03-01 ENCOUNTER — Other Ambulatory Visit: Payer: Self-pay

## 2017-03-01 DIAGNOSIS — C184 Malignant neoplasm of transverse colon: Secondary | ICD-10-CM

## 2017-03-02 ENCOUNTER — Ambulatory Visit (HOSPITAL_BASED_OUTPATIENT_CLINIC_OR_DEPARTMENT_OTHER): Payer: Medicare Other

## 2017-03-02 ENCOUNTER — Other Ambulatory Visit (HOSPITAL_BASED_OUTPATIENT_CLINIC_OR_DEPARTMENT_OTHER): Payer: Medicare Other

## 2017-03-02 ENCOUNTER — Telehealth: Payer: Self-pay | Admitting: Nurse Practitioner

## 2017-03-02 ENCOUNTER — Ambulatory Visit (HOSPITAL_BASED_OUTPATIENT_CLINIC_OR_DEPARTMENT_OTHER): Payer: Medicare Other | Admitting: Nurse Practitioner

## 2017-03-02 VITALS — BP 130/79 | HR 82 | Temp 98.2°F | Resp 18 | Ht 63.0 in | Wt 105.8 lb

## 2017-03-02 DIAGNOSIS — C184 Malignant neoplasm of transverse colon: Secondary | ICD-10-CM

## 2017-03-02 DIAGNOSIS — D598 Other acquired hemolytic anemias: Secondary | ICD-10-CM

## 2017-03-02 DIAGNOSIS — Z79899 Other long term (current) drug therapy: Secondary | ICD-10-CM | POA: Diagnosis not present

## 2017-03-02 DIAGNOSIS — Z5112 Encounter for antineoplastic immunotherapy: Secondary | ICD-10-CM | POA: Diagnosis present

## 2017-03-02 LAB — COMPREHENSIVE METABOLIC PANEL
ALK PHOS: 57 U/L (ref 40–150)
ALT: 21 U/L (ref 0–55)
ANION GAP: 9 meq/L (ref 3–11)
AST: 18 U/L (ref 5–34)
Albumin: 3.7 g/dL (ref 3.5–5.0)
BUN: 15.6 mg/dL (ref 7.0–26.0)
CHLORIDE: 100 meq/L (ref 98–109)
CO2: 27 meq/L (ref 22–29)
Calcium: 9.3 mg/dL (ref 8.4–10.4)
Creatinine: 0.7 mg/dL (ref 0.6–1.1)
EGFR: 72 mL/min/{1.73_m2} — AB (ref >90)
Glucose: 91 mg/dl (ref 70–140)
Potassium: 4.6 mEq/L (ref 3.5–5.1)
Sodium: 135 mEq/L — ABNORMAL LOW (ref 136–145)
Total Bilirubin: 1.71 mg/dL — ABNORMAL HIGH (ref 0.20–1.20)
Total Protein: 7.1 g/dL (ref 6.4–8.3)

## 2017-03-02 LAB — CBC WITH DIFFERENTIAL/PLATELET
BASO%: 0.8 % (ref 0.0–2.0)
BASOS ABS: 0.1 10*3/uL (ref 0.0–0.1)
EOS ABS: 0.2 10*3/uL (ref 0.0–0.5)
EOS%: 2.1 % (ref 0.0–7.0)
HCT: 31.9 % — ABNORMAL LOW (ref 34.8–46.6)
HGB: 11.1 g/dL — ABNORMAL LOW (ref 11.6–15.9)
LYMPH%: 23.6 % (ref 14.0–49.7)
MCH: 34.5 pg — AB (ref 25.1–34.0)
MCHC: 34.8 g/dL (ref 31.5–36.0)
MCV: 99.1 fL (ref 79.5–101.0)
MONO#: 0.7 10*3/uL (ref 0.1–0.9)
MONO%: 8.2 % (ref 0.0–14.0)
NEUT#: 5.4 10*3/uL (ref 1.5–6.5)
NEUT%: 65.3 % (ref 38.4–76.8)
Platelets: 294 10*3/uL (ref 145–400)
RBC: 3.22 10*6/uL — AB (ref 3.70–5.45)
RDW: 13.4 % (ref 11.2–14.5)
WBC: 8.3 10*3/uL (ref 3.9–10.3)
lymph#: 2 10*3/uL (ref 0.9–3.3)
nRBC: 0 % (ref 0–0)

## 2017-03-02 LAB — TSH: TSH: 1.551 m[IU]/L (ref 0.308–3.960)

## 2017-03-02 MED ORDER — SODIUM CHLORIDE 0.9 % IV SOLN
Freq: Once | INTRAVENOUS | Status: AC
  Administered 2017-03-02: 11:00:00 via INTRAVENOUS

## 2017-03-02 MED ORDER — SODIUM CHLORIDE 0.9 % IV SOLN
200.0000 mg | Freq: Once | INTRAVENOUS | Status: AC
  Administered 2017-03-02: 200 mg via INTRAVENOUS
  Filled 2017-03-02: qty 8

## 2017-03-02 NOTE — Telephone Encounter (Signed)
Gave patient AVS and calender. Per 3/21 los lab and treatment already scheduled - flush/ md added . Called  imaging per [place] patient is on the work que and will give give patient a call when appt is scheduled.

## 2017-03-02 NOTE — Progress Notes (Signed)
NP okay to tx with [place] today despite bilirubin 1.71. Elevated bilirubin to be expected.

## 2017-03-02 NOTE — Progress Notes (Addendum)
[facility] OFFICE PROGRESS NOTE   Diagnosis: Colon cancer   INTERVAL HISTORY:   Brianna [name] returns as scheduled. She completed cycle 3 Pembrolizumab 02/09/2017. She feels well. No nausea or vomiting. No mouth sores. No diarrhea. No abdominal pain. Stable chronic back pain. She has discontinued the Duragesic patch. She reports a good appetite. She has noted "itching" involving multiple areas including the left outer ear, chest and arms. This mainly occurs at nighttime. She has not noticed a rash. She wonders if the itching is related to dry skin.  Objective:  Vital signs in last 24 hours:  Blood pressure 130/79, pulse 82, temperature 98.2 F (36.8 C), temperature source Oral, resp. rate 18, height 5' 3"  (1.6 m), weight 105 lb 12.8 oz (48 kg), SpO2 100 %.    HEENT: No thrush or ulcers. Resp: Lungs clear bilaterally. Cardio: Regular rate and rhythm. GI: Abdomen soft and nontender. No organomegaly. Vascular: No leg edema. Neuro: Alert and oriented.  Skin: Skin has a dry appearance. No rash.    Lab Results:  Lab Results  Component Value Date   WBC 8.3 03/02/2017   HGB 11.1 (L) 03/02/2017   HCT 31.9 (L) 03/02/2017   MCV 99.1 03/02/2017   PLT 294 03/02/2017   NEUTROABS 5.4 03/02/2017    Imaging:  No results found.  Medications: I have reviewed the patient's current medications.  Assessment/Plan: 1. Admission with fever and liver abscesses 06/19/2016  Status post placement of a liver drain on 06/21/2010, New drain placed 06/30/2016  Treated with broad-spectrum intravenous antibiotics, completed outpatient course of Augmentin   CT 07/20/2016-no residual abscess identified  CT 10/20/2016-improved appearance of the liver; no new liver lesions; apple core type lesion involving the mid transverse colon continuing for approximately 5.6 cm. No obstruction. Probable transmural disease extension especially anteriorly. Suspected enlarged nodes at the root of  the transverse mesocolon; no pelvic sidewall adenopathy.  Chest CT 10/16/2017-interval development of multifocal ill-defined low attenuation lesions within the liver suspicious for recurrent abscesses.  Abdomen/pelvis CT 12/17/2016-numerous new scattered hypodense hepatic lesions. Necrotic conglomerate central mesenteric adenopathy. Questionable omental deposits of tumor. Borderline adenopathy in the retrocrural region bilaterally. Diffuse mesenteric edema.  2. 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 Hyperbilirubinemia secondary to hemolysis and liver abscesses  5. CT abdomen/pelvis 10/20/2016-improved appearance of the liver; no new liver lesions; apple core type lesion involving the mid transverse colon continuing for approximately 5.6 cm. No obstruction. Probable transmural disease extension especially anteriorly. Suspected enlarged nodes at the root of the transverse mesocolon;no pelvic sidewall adenopathy.  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 Necrotic conglomerate central mesenteric adenopathy. Questionable omental deposits of tumor. Borderline adenopathy in the retrocrural region bilaterally. Diffuse mesenteric edema.  Cycle 1 Pembrolizumab 12/22/2016  Cycle 2 Pembrolizumab01/31/2018  Cycle 3 Pembrolizumab 02/09/2017  Cycle 4 Pembrolizumab 03/02/2017  6. 6 month history of left mid back pain with recent worsening. CT chest 12/16/2016 showed no specific etiology for the pain. Interval development of multifocal ill-defined lesions within the liver suspicious for recurrent abscesses noted. 13 mm nodular density right middle lobe likely post infectious or inflammatory. Multifocal areas of bronchiectasis.12/17/2016-the pain may be related to the mesenteric mass. Pain resolved with Pembrolizumab treatment     Disposition: Brianna [name] appears stable. She has completed 3 cycles of Pembrolizumab. Plan to proceed with cycle 4 today as scheduled. She will have restaging CT scans on 03/21/2017. She will return for a follow-up visit on 03/23/2017. She will contact the office in the interim with any problems.  The hemoglobin continues to improve. The mildly elevated bilirubin is likely related to ongoing low-grade hemolysis.  Patient seen with Dr. Benay Spice.    Ned Card ANP/GNP-BC   03/02/2017  10:00 AM This was a shared visit with Ned Card. Brianna [name] appears well. The plan is to continue the current therapy. She will undergo a restaging CT evaluation after this cycle.  Julieanne Manson, M.D.

## 2017-03-02 NOTE — Patient Instructions (Signed)
[facility] Discharge Instructions for Patients Receiving Chemotherapy  Today you received the following chemotherapy agents Keytruda  To help prevent nausea and vomiting after your treatment, we encourage you to take your nausea medication    If you develop nausea and vomiting that is not controlled by your nausea medication, call the clinic.   BELOW ARE SYMPTOMS THAT SHOULD BE REPORTED IMMEDIATELY:  *FEVER GREATER THAN 100.5 F  *CHILLS WITH OR WITHOUT FEVER  NAUSEA AND VOMITING THAT IS NOT CONTROLLED WITH YOUR NAUSEA MEDICATION  *UNUSUAL SHORTNESS OF BREATH  *UNUSUAL BRUISING OR BLEEDING  TENDERNESS IN MOUTH AND THROAT WITH OR WITHOUT PRESENCE OF ULCERS  *URINARY PROBLEMS  *BOWEL PROBLEMS  UNUSUAL RASH Items with * indicate a potential emergency and should be followed up as soon as possible.  Feel free to call the clinic you have any questions or concerns. The clinic phone number is [phone].  Please show the CHEMO ALERT CARD at check-in to the Emergency Department and triage nurse.   

## 2017-03-02 NOTE — Telephone Encounter (Signed)
Okay per [facility] at [facility] to give patient contrast from here.

## 2017-03-19 ENCOUNTER — Other Ambulatory Visit: Payer: Self-pay | Admitting: Oncology

## 2017-03-21 ENCOUNTER — Ambulatory Visit
Admission: RE | Admit: 2017-03-21 | Discharge: 2017-03-21 | Disposition: A | Discharge status: Home / Self Care | Payer: Medicare Other | Source: Ambulatory Visit | Attending: Nurse Practitioner | Admitting: Nurse Practitioner

## 2017-03-21 DIAGNOSIS — C184 Malignant neoplasm of transverse colon: Secondary | ICD-10-CM

## 2017-03-21 MED ORDER — IOPAMIDOL (ISOVUE-300) INJECTION 61%
100.0000 mL | Freq: Once | INTRAVENOUS | Status: AC | PRN
Start: 2017-03-21 — End: 2017-03-21
  Administered 2017-03-21: 100 mL via INTRAVENOUS

## 2017-03-23 ENCOUNTER — Other Ambulatory Visit (HOSPITAL_BASED_OUTPATIENT_CLINIC_OR_DEPARTMENT_OTHER): Payer: Medicare Other

## 2017-03-23 ENCOUNTER — Other Ambulatory Visit: Payer: Medicare Other

## 2017-03-23 ENCOUNTER — Other Ambulatory Visit: Payer: Self-pay | Admitting: *Deleted

## 2017-03-23 ENCOUNTER — Telehealth: Payer: Self-pay | Admitting: Oncology

## 2017-03-23 ENCOUNTER — Other Ambulatory Visit (HOSPITAL_COMMUNITY)
Admission: RE | Admit: 2017-03-23 | Discharge: 2017-03-23 | Disposition: A | Discharge status: Home / Self Care | Payer: Medicare Other | Source: Ambulatory Visit | Attending: Oncology | Admitting: Oncology

## 2017-03-23 ENCOUNTER — Ambulatory Visit (HOSPITAL_BASED_OUTPATIENT_CLINIC_OR_DEPARTMENT_OTHER): Payer: Medicare Other

## 2017-03-23 ENCOUNTER — Ambulatory Visit: Payer: Medicare Other

## 2017-03-23 ENCOUNTER — Ambulatory Visit (HOSPITAL_BASED_OUTPATIENT_CLINIC_OR_DEPARTMENT_OTHER): Payer: Medicare Other | Admitting: Oncology

## 2017-03-23 VITALS — BP 157/62 | HR 77 | Temp 98.2°F | Resp 18 | Ht 63.0 in | Wt 109.7 lb

## 2017-03-23 DIAGNOSIS — D591 Other autoimmune hemolytic anemias: Secondary | ICD-10-CM | POA: Diagnosis not present

## 2017-03-23 DIAGNOSIS — C184 Malignant neoplasm of transverse colon: Secondary | ICD-10-CM | POA: Insufficient documentation

## 2017-03-23 DIAGNOSIS — K769 Liver disease, unspecified: Secondary | ICD-10-CM

## 2017-03-23 DIAGNOSIS — Z5181 Encounter for therapeutic drug level monitoring: Secondary | ICD-10-CM

## 2017-03-23 DIAGNOSIS — Z5112 Encounter for antineoplastic immunotherapy: Secondary | ICD-10-CM

## 2017-03-23 DIAGNOSIS — L299 Pruritus, unspecified: Secondary | ICD-10-CM

## 2017-03-23 DIAGNOSIS — D5912 Cold autoimmune hemolytic anemia: Secondary | ICD-10-CM

## 2017-03-23 LAB — COMPREHENSIVE METABOLIC PANEL
ALT: 20 U/L (ref 14–54)
AST: 21 U/L (ref 15–41)
Albumin: 4 g/dL (ref 3.5–5.0)
Alkaline Phosphatase: 49 U/L (ref 38–126)
Anion gap: 6 (ref 5–15)
BILIRUBIN TOTAL: 1.5 mg/dL — AB (ref 0.3–1.2)
BUN: 19 mg/dL (ref 6–20)
CO2: 30 mmol/L (ref 22–32)
CREATININE: 0.6 mg/dL (ref 0.44–1.00)
Calcium: 9 mg/dL (ref 8.9–10.3)
Chloride: 98 mmol/L — ABNORMAL LOW (ref 101–111)
GFR calc Af Amer: >60 mL/min (ref >60)
Glucose, Bld: 102 mg/dL — ABNORMAL HIGH (ref 65–99)
Potassium: 5 mmol/L (ref 3.5–5.1)
Sodium: 134 mmol/L — ABNORMAL LOW (ref 135–145)
TOTAL PROTEIN: 7.4 g/dL (ref 6.5–8.1)

## 2017-03-23 LAB — CBC WITH DIFFERENTIAL/PLATELET
BASO%: 0.7 % (ref 0.0–2.0)
BASOS ABS: 0.1 10*3/uL (ref 0.0–0.1)
EOS ABS: 0.2 10*3/uL (ref 0.0–0.5)
EOS%: 1.7 % (ref 0.0–7.0)
HEMATOCRIT: 32.3 % — AB (ref 34.8–46.6)
HGB: 10.9 g/dL — ABNORMAL LOW (ref 11.6–15.9)
LYMPH#: 2.3 10*3/uL (ref 0.9–3.3)
LYMPH%: 24.2 % (ref 14.0–49.7)
MCH: 33 pg (ref 25.1–34.0)
MCHC: 33.7 g/dL (ref 31.5–36.0)
MCV: 97.9 fL (ref 79.5–101.0)
MONO#: 0.7 10*3/uL (ref 0.1–0.9)
MONO%: 7.6 % (ref 0.0–14.0)
NEUT%: 65.8 % (ref 38.4–76.8)
NEUTROS ABS: 6.3 10*3/uL (ref 1.5–6.5)
NRBC: 0 % (ref 0–0)
PLATELETS: 293 10*3/uL (ref 145–400)
RBC: 3.3 10*6/uL — ABNORMAL LOW (ref 3.70–5.45)
RDW: 13.2 % (ref 11.2–14.5)
WBC: 9.6 10*3/uL (ref 3.9–10.3)

## 2017-03-23 MED ORDER — SODIUM CHLORIDE 0.9 % IV SOLN
200.0000 mg | Freq: Once | INTRAVENOUS | Status: AC
  Administered 2017-03-23: 200 mg via INTRAVENOUS
  Filled 2017-03-23: qty 8

## 2017-03-23 MED ORDER — SODIUM CHLORIDE 0.9 % IV SOLN
Freq: Once | INTRAVENOUS | Status: AC
  Administered 2017-03-23: 12:00:00 via INTRAVENOUS

## 2017-03-23 NOTE — Progress Notes (Signed)
[facility] OFFICE PROGRESS NOTE   Diagnosis: Colon cancer, hemolytic anemia  INTERVAL HISTORY:   Brianna [name] returns as scheduled. She completed another treatment with Pembrolizumab On 03/02/2017. Restaging CTs on 03/21/2017 revealed an unchanged right upper lobe nodule and a new 4 mm nonspecific right lower lobe nodule. Multiple liver lesions are smaller. Mesenteric adenopathy has resolved. No enlarged retroperitoneal nodes. She feels well. Good appetite and energy level. She has chronic back pain that she relates to compression fractures. She complains of pruritus at the abdomen and years. No rash. No diarrhea.  Objective:  Vital signs in last 24 hours:  Blood pressure (!) 157/62, pulse 77, temperature 98.2 F (36.8 C), temperature source Oral, resp. rate 18, height 5' 3"  (1.6 m), weight 109 lb 11.2 oz (49.8 kg), SpO2 100 %.    HEENT: No thrush or ulcers Resp: Lungs clear bilaterally Cardio: Regular rate and rhythm GI: Nontender, no hepatosplenomegaly, no mass Vascular: No leg edema  Skin: No rash, mild erythema over the abdominal wall in areas of scratching. Ears without rash      Lab Results:  Lab Results  Component Value Date   WBC 9.6 03/23/2017   HGB 10.9 (L) 03/23/2017   HCT 32.3 (L) 03/23/2017   MCV 97.9 03/23/2017   PLT 293 03/23/2017   NEUTROABS 6.3 03/23/2017     Imaging:  Ct Chest W Contrast  Result Date: 03/21/2017 CLINICAL DATA:  Restaging metastatic colon cancer. EXAM: CT CHEST, ABDOMEN, AND PELVIS WITH CONTRAST TECHNIQUE: Multidetector CT imaging of the chest, abdomen and pelvis was performed following the standard protocol during bolus administration of intravenous contrast. CONTRAST:  111m ISOVUE-300 IOPAMIDOL (ISOVUE-300) INJECTION 61% COMPARISON:  12/16/2016 FINDINGS: CT CHEST FINDINGS Cardiovascular: The heart size is mildly enlarged. No pericardial effusion. Aortic atherosclerosis noted. Mediastinum/Nodes: The trachea appears  patent and is midline. Normal appearance of the esophagus. No mediastinal or hilar adenopathy identified. Lungs/Pleura: No pleural effusion. Mild changes of centrilobular emphysema identified. Bronchial wall thickening noted. Small nonspecific pulmonary nodule is identified in the right lower lobe 4 mm, image 59 of series 4. New from previous exam. Right upper lobe perifissural nodule measures 3 mm and is unchanged from previous exam, image 61 of series 4. Musculoskeletal: The bones appear diffusely osteopenic. No aggressive lytic or sclerotic bone lesions identified. Unchanged compression fracture involving T11 vertebra. CT ABDOMEN PELVIS FINDINGS Hepatobiliary: Multiple liver metastases are again identified. Index lesion within the posterior right lobe of liver measures 1.7 cm, image 57 of series 3. Previously 2.4 cm, image 55 of series 3. Adjacent lesion measures 1.4 cm, image 55 of series 3. Previously 1.7 cm. Lateral segment of left lobe of liver lesion measures 0.6 cm, image 52 of series 3. Previously 0.8 cm. Pancreas: Small low-attenuation foci within the body and tail of pancreas are unchanged, image 57 of series 3 and image 59 of series 3. Spleen: Normal in size without focal abnormality. Adrenals/Urinary Tract: The adrenal glands are normal. Possible enhancing lesion arising from the posterolateral cortex of the left mid kidney measures 1.7 cm, image 60 of series 3. Unchanged from previous exam. Unremarkable appearance of the right kidney. No hydronephrosis identified. Stomach/Bowel: Stomach is normal. The small bowel loops are unremarkable. No pathologic dilatation of the colon. Vascular/Lymphatic: Aortic atherosclerosis. The index mesenteric adenopathy has resolved in the interval. No enlarged retroperitoneal lymph nodes. No pelvic or inguinal adenopathy. Reproductive: Uterus and bilateral adnexa are unremarkable. Other: No ascites or focal fluid collections identified. Musculoskeletal: The bones  appear  diffusely osteopenic. There are no aggressive lytic or sclerotic bone lesions. Compression fractures are identified involving L1 and L3. Unchanged from previous exam. IMPRESSION: 1. Interval response to therapy. 2. Interval decrease in size of index liver lesions. 3. Necrotic adenopathy within the mesenteries has resolved in the interval. 4. Small nonspecific pulmonary nodule in the right lower lobe is new from previous exam measuring 4 mm. Attention on follow-up imaging advise. 5. Stable appearance of osteopenia and multi level compression fractures within the thoracic and lumbar spine. Electronically Signed   By: Kerby Moors M.D.   On: 03/21/2017 16:02   Ct Abdomen Pelvis W Contrast  Result Date: 03/21/2017 CLINICAL DATA:  Restaging metastatic colon cancer. EXAM: CT CHEST, ABDOMEN, AND PELVIS WITH CONTRAST TECHNIQUE: Multidetector CT imaging of the chest, abdomen and pelvis was performed following the standard protocol during bolus administration of intravenous contrast. CONTRAST:  155m ISOVUE-300 IOPAMIDOL (ISOVUE-300) INJECTION 61% COMPARISON:  12/16/2016 FINDINGS: CT CHEST FINDINGS Cardiovascular: The heart size is mildly enlarged. No pericardial effusion. Aortic atherosclerosis noted. Mediastinum/Nodes: The trachea appears patent and is midline. Normal appearance of the esophagus. No mediastinal or hilar adenopathy identified. Lungs/Pleura: No pleural effusion. Mild changes of centrilobular emphysema identified. Bronchial wall thickening noted. Small nonspecific pulmonary nodule is identified in the right lower lobe 4 mm, image 59 of series 4. New from previous exam. Right upper lobe perifissural nodule measures 3 mm and is unchanged from previous exam, image 61 of series 4. Musculoskeletal: The bones appear diffusely osteopenic. No aggressive lytic or sclerotic bone lesions identified. Unchanged compression fracture involving T11 vertebra. CT ABDOMEN PELVIS FINDINGS Hepatobiliary: Multiple liver  metastases are again identified. Index lesion within the posterior right lobe of liver measures 1.7 cm, image 57 of series 3. Previously 2.4 cm, image 55 of series 3. Adjacent lesion measures 1.4 cm, image 55 of series 3. Previously 1.7 cm. Lateral segment of left lobe of liver lesion measures 0.6 cm, image 52 of series 3. Previously 0.8 cm. Pancreas: Small low-attenuation foci within the body and tail of pancreas are unchanged, image 57 of series 3 and image 59 of series 3. Spleen: Normal in size without focal abnormality. Adrenals/Urinary Tract: The adrenal glands are normal. Possible enhancing lesion arising from the posterolateral cortex of the left mid kidney measures 1.7 cm, image 60 of series 3. Unchanged from previous exam. Unremarkable appearance of the right kidney. No hydronephrosis identified. Stomach/Bowel: Stomach is normal. The small bowel loops are unremarkable. No pathologic dilatation of the colon. Vascular/Lymphatic: Aortic atherosclerosis. The index mesenteric adenopathy has resolved in the interval. No enlarged retroperitoneal lymph nodes. No pelvic or inguinal adenopathy. Reproductive: Uterus and bilateral adnexa are unremarkable. Other: No ascites or focal fluid collections identified. Musculoskeletal: The bones appear diffusely osteopenic. There are no aggressive lytic or sclerotic bone lesions. Compression fractures are identified involving L1 and L3. Unchanged from previous exam. IMPRESSION: 1. Interval response to therapy. 2. Interval decrease in size of index liver lesions. 3. Necrotic adenopathy within the mesenteries has resolved in the interval. 4. Small nonspecific pulmonary nodule in the right lower lobe is new from previous exam measuring 4 mm. Attention on follow-up imaging advise. 5. Stable appearance of osteopenia and multi level compression fractures within the thoracic and lumbar spine. Electronically Signed   By: TKerby MoorsM.D.   On: 03/21/2017 16:02    Medications: I  have reviewed the patient's current medications.  Assessment/Plan: 1. Admission with fever and liver abscesses 06/19/2016  Status post placement  of a liver drain on 06/21/2010, New drain placed 06/30/2016  Treated with broad-spectrum intravenous antibiotics, completed outpatient course of Augmentin   CT 07/20/2016-no residual abscess identified  CT 10/20/2016-improved appearance of the liver; no new liver lesions; apple core type lesion involving the mid transverse colon continuing for approximately 5.6 cm. No obstruction. Probable transmural disease extension especially anteriorly. Suspected enlarged nodes at the root of the transverse mesocolon; no pelvic sidewall adenopathy.  Chest CT 10/16/2017-interval development of multifocal ill-defined low attenuation lesions within the liver suspicious for recurrent abscesses.  Abdomen/pelvis CT 12/17/2016-numerous new scattered hypodense hepatic lesions. Necrotic conglomerate central mesenteric adenopathy. Questionable omental deposits of tumor. Borderline adenopathy in the retrocrural region bilaterally. Diffuse mesenteric edema.  2. 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 Hyperbilirubinemia secondary to hemolysis and liver abscesses  5. CT abdomen/pelvis 10/20/2016-improved appearance of the liver; no new liver lesions; apple core type lesion involving the mid transverse colon continuing for approximately 5.6 cm. No obstruction. Probable transmural disease extension especially  anteriorly. Suspected enlarged nodes at the root of the transverse mesocolon;no pelvic sidewall adenopathy.  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 Necrotic conglomerate central mesenteric adenopathy. Questionable omental deposits of tumor. Borderline adenopathy in the retrocrural region bilaterally. Diffuse mesenteric edema.  Cycle 1 Pembrolizumab 12/22/2016  Cycle 2 Pembrolizumab01/31/2018  Cycle 3 Pembrolizumab 02/09/2017  Cycle 4 Pembrolizumab 03/02/2017  CTs 03/21/2017-decrease in liver lesions, resolution of mesenteric adenopathy, new nonspecific tiny right lower lobe nodule  Cycle 5 Pembrolizumab 03/21/2017   6. 6 month history of left mid back pain with recent worsening. CT chest 12/16/2016 showed no specific etiology for the pain. Interval development of multifocal ill-defined lesions within the liver suspicious for recurrent abscesses noted. 13 mm nodular density right middle lobe likely post infectious or inflammatory. Multifocal areas of bronchiectasis.12/17/2016-the pain may be related to the mesenteric mass. Pain resolved with Pembrolizumab treatment   Disposition:  Ms. Sundberg appears well. There is been a marked improvement in her performance status while on Pembrolizumab. The restaging CT confirms a clinical response. The plan is to continue every three-week treatment.  The etiology of the pruritus is unclear. I think this is most likely unrelated to  treatment.  She will return for an office visit in 3 weeks.  I reviewed the CT images with Ms. Fana and her daughter.  The anemia is also significantly improved. This may be related to the warmer weather or pembrolizumab.  25 minutes were spent with the patient today. The majority of the time was used for counseling and coordination of care.   Brianna Coder, MD  03/23/2017  11:01 AM

## 2017-03-23 NOTE — Telephone Encounter (Signed)
Appointments scheduled per 4.11.18 LOS. Patient given AVS report and calendars with future scheduled appointments. °

## 2017-03-23 NOTE — Patient Instructions (Signed)
[facility] Discharge Instructions for Patients Receiving Chemotherapy  Today you received the following chemotherapy agents: Keytruda   To help prevent nausea and vomiting after your treatment, we encourage you to take your nausea medication as directed    If you develop nausea and vomiting that is not controlled by your nausea medication, call the clinic.   BELOW ARE SYMPTOMS THAT SHOULD BE REPORTED IMMEDIATELY:  *FEVER GREATER THAN 100.5 F  *CHILLS WITH OR WITHOUT FEVER  NAUSEA AND VOMITING THAT IS NOT CONTROLLED WITH YOUR NAUSEA MEDICATION  *UNUSUAL SHORTNESS OF BREATH  *UNUSUAL BRUISING OR BLEEDING  TENDERNESS IN MOUTH AND THROAT WITH OR WITHOUT PRESENCE OF ULCERS  *URINARY PROBLEMS  *BOWEL PROBLEMS  UNUSUAL RASH Items with * indicate a potential emergency and should be followed up as soon as possible.  Feel free to call the clinic you have any questions or concerns. The clinic phone number is [phone].  Please show the CHEMO ALERT CARD at check-in to the Emergency Department and triage nurse.   

## 2017-04-10 ENCOUNTER — Other Ambulatory Visit: Payer: Self-pay | Admitting: Oncology

## 2017-04-12 ENCOUNTER — Other Ambulatory Visit: Payer: Self-pay | Admitting: *Deleted

## 2017-04-13 ENCOUNTER — Ambulatory Visit (HOSPITAL_BASED_OUTPATIENT_CLINIC_OR_DEPARTMENT_OTHER): Payer: Medicare Other | Admitting: Oncology

## 2017-04-13 ENCOUNTER — Ambulatory Visit (HOSPITAL_BASED_OUTPATIENT_CLINIC_OR_DEPARTMENT_OTHER): Payer: Medicare Other

## 2017-04-13 ENCOUNTER — Other Ambulatory Visit (HOSPITAL_BASED_OUTPATIENT_CLINIC_OR_DEPARTMENT_OTHER): Payer: Medicare Other

## 2017-04-13 ENCOUNTER — Telehealth: Payer: Self-pay | Admitting: *Deleted

## 2017-04-13 ENCOUNTER — Other Ambulatory Visit: Payer: Self-pay | Admitting: *Deleted

## 2017-04-13 ENCOUNTER — Ambulatory Visit: Payer: Medicare Other

## 2017-04-13 VITALS — BP 143/76 | HR 70 | Temp 98.1°F | Resp 18 | Wt 112.8 lb

## 2017-04-13 DIAGNOSIS — D591 Other autoimmune hemolytic anemias: Secondary | ICD-10-CM | POA: Diagnosis not present

## 2017-04-13 DIAGNOSIS — K769 Liver disease, unspecified: Secondary | ICD-10-CM

## 2017-04-13 DIAGNOSIS — Z79899 Other long term (current) drug therapy: Secondary | ICD-10-CM

## 2017-04-13 DIAGNOSIS — Z5112 Encounter for antineoplastic immunotherapy: Secondary | ICD-10-CM | POA: Diagnosis present

## 2017-04-13 DIAGNOSIS — L299 Pruritus, unspecified: Secondary | ICD-10-CM | POA: Diagnosis not present

## 2017-04-13 DIAGNOSIS — C184 Malignant neoplasm of transverse colon: Secondary | ICD-10-CM | POA: Diagnosis present

## 2017-04-13 DIAGNOSIS — Z5181 Encounter for therapeutic drug level monitoring: Secondary | ICD-10-CM

## 2017-04-13 LAB — COMPREHENSIVE METABOLIC PANEL
ALK PHOS: 63 U/L (ref 40–150)
ALT: 22 U/L (ref 0–55)
AST: 26 U/L (ref 5–34)
Albumin: 3.9 g/dL (ref 3.5–5.0)
Anion Gap: 12 mEq/L — ABNORMAL HIGH (ref 3–11)
BUN: 19.2 mg/dL (ref 7.0–26.0)
CO2: 26 mEq/L (ref 22–29)
Calcium: 9.4 mg/dL (ref 8.4–10.4)
Chloride: 101 mEq/L (ref 98–109)
Creatinine: 0.7 mg/dL (ref 0.6–1.1)
EGFR: 76 mL/min/{1.73_m2} — AB (ref >90)
Glucose: 85 mg/dl (ref 70–140)
POTASSIUM: 5.4 meq/L — AB (ref 3.5–5.1)
Sodium: 138 mEq/L (ref 136–145)
Total Bilirubin: 1.84 mg/dL — ABNORMAL HIGH (ref 0.20–1.20)
Total Protein: 7.9 g/dL (ref 6.4–8.3)

## 2017-04-13 LAB — BASIC METABOLIC PANEL
Anion Gap: 11 mEq/L (ref 3–11)
BUN: 18.6 mg/dL (ref 7.0–26.0)
CHLORIDE: 100 meq/L (ref 98–109)
CO2: 28 mEq/L (ref 22–29)
Calcium: 9.5 mg/dL (ref 8.4–10.4)
Creatinine: 0.7 mg/dL (ref 0.6–1.1)
EGFR: 76 mL/min/{1.73_m2} — ABNORMAL LOW (ref >90)
Glucose: 93 mg/dl (ref 70–140)
POTASSIUM: 4.5 meq/L (ref 3.5–5.1)
SODIUM: 138 meq/L (ref 136–145)

## 2017-04-13 LAB — TSH: TSH: 0.782 m[IU]/L (ref 0.308–3.960)

## 2017-04-13 MED ORDER — SODIUM CHLORIDE 0.9 % IV SOLN
Freq: Once | INTRAVENOUS | Status: AC
  Administered 2017-04-13: 10:00:00 via INTRAVENOUS

## 2017-04-13 MED ORDER — METOPROLOL SUCCINATE ER 25 MG PO TB24: 25.0000 mg | ORAL_TABLET | Freq: Every day | ORAL | 0 refills | Status: AC

## 2017-04-13 MED ORDER — METOPROLOL SUCCINATE ER 25 MG PO TB24: 25.0000 mg | ORAL_TABLET | Freq: Every day | ORAL | 0 refills | Status: DC

## 2017-04-13 MED ORDER — SODIUM CHLORIDE 0.9 % IV SOLN
200.0000 mg | Freq: Once | INTRAVENOUS | Status: AC
  Administered 2017-04-13: 200 mg via INTRAVENOUS
  Filled 2017-04-13: qty 8

## 2017-04-13 MED ORDER — ALPRAZOLAM 0.25 MG PO TABS: 0.2500 mg | ORAL_TABLET | Freq: Every evening | ORAL | 0 refills | Status: DC | PRN

## 2017-04-13 NOTE — Patient Instructions (Addendum)
[facility] Discharge Instructions for Patients Receiving Chemotherapy  Today you received the following chemotherapy agents: Keytruda.  To help prevent nausea and vomiting after your treatment, we encourage you to take your nausea medication If you develop nausea and vomiting that is not controlled by your nausea medication, call the clinic.   BELOW ARE SYMPTOMS THAT SHOULD BE REPORTED IMMEDIATELY:  *FEVER GREATER THAN 100.5 F  *CHILLS WITH OR WITHOUT FEVER  NAUSEA AND VOMITING THAT IS NOT CONTROLLED WITH YOUR NAUSEA MEDICATION  *UNUSUAL SHORTNESS OF BREATH  *UNUSUAL BRUISING OR BLEEDING  TENDERNESS IN MOUTH AND THROAT WITH OR WITHOUT PRESENCE OF ULCERS  *URINARY PROBLEMS  *BOWEL PROBLEMS  UNUSUAL RASH Items with * indicate a potential emergency and should be followed up as soon as possible.  Feel free to call the clinic you have any questions or concerns. The clinic phone number is [phone].  Please show the [facility] at check-in to the Emergency Department and triage nurse.    Pembrolizumab injection What is this medicine? PEMBROLIZUMAB (pem broe liz ue mab) is a monoclonal antibody. It is used to treat melanoma, head and neck cancer, Hodgkin lymphoma, and non-small cell lung cancer. COMMON BRAND NAME(S): Keytruda What should I tell my health care provider before I take this medicine? They need to know if you have any of these conditions: -diabetes -immune system problems -inflammatory bowel disease -liver disease -lung or breathing disease -lupus -organ transplant -an unusual or allergic reaction to pembrolizumab, other medicines, foods, dyes, or preservatives -pregnant or trying to get pregnant -breast-feeding How should I use this medicine? This medicine is for infusion into a vein. It is given by a health care professional in a hospital or clinic setting. A special MedGuide will be given to you before each treatment. Be sure to  read this information carefully each time. Talk to your pediatrician regarding the use of this medicine in children. While this drug may be prescribed for selected conditions, precautions do apply. What if I miss a dose? It is important not to miss your dose. Call your doctor or health care professional if you are unable to keep an appointment. What may interact with this medicine? Interactions have not been studied. Give your health care provider a list of all the medicines, herbs, non-prescription drugs, or dietary supplements you use. Also tell them if you smoke, drink alcohol, or use illegal drugs. Some items may interact with your medicine. What should I watch for while using this medicine? Your condition will be monitored carefully while you are receiving this medicine. You may need blood work done while you are taking this medicine. Do not become pregnant while taking this medicine or for 4 months after stopping it. Women should inform their doctor if they wish to become pregnant or think they might be pregnant. There is a potential for serious side effects to an unborn child. Talk to your health care professional or pharmacist for more information. Do not breast-feed an infant while taking this medicine or for 4 months after the last dose. What side effects may I notice from receiving this medicine? Side effects that you should report to your doctor or health care professional as soon as possible: -allergic reactions like skin rash, itching or hives, swelling of the face, lips, or tongue -bloody or black, tarry -breathing problems -changes in vision -chest pain -chills -constipation -cough -dizziness or feeling faint or lightheaded -fast or irregular heartbeat -fever -flushing -hair loss -low blood counts -  this medicine may decrease the number of white blood cells, red blood cells and platelets. You may be at increased risk for infections and bleeding. -muscle pain -muscle  weakness -persistent headache -signs and symptoms of high blood sugar such as dizziness; dry mouth; dry skin; fruity breath; nausea; stomach pain; increased hunger or thirst; increased urination -signs and symptoms of kidney injury like trouble passing urine or change in the amount of urine -signs and symptoms of liver injury like dark urine, light-colored stools, loss of appetite, nausea, right upper belly pain, yellowing of the eyes or skin -stomach pain -sweating -weight loss Side effects that usually do not require medical attention (report to your doctor or health care professional if they continue or are bothersome): -decreased appetite -diarrhea -tiredness Where should I keep my medicine? This drug is given in a hospital or clinic and will not be stored at home.  2017 Elsevier/Gold Standard (2016-03-01 19:28:44)

## 2017-04-13 NOTE — Progress Notes (Signed)
OK to treat with CBC and CMET from 03/23/17 per Dr. Benay Spice.

## 2017-04-13 NOTE — Progress Notes (Signed)
Ok to treat with CBC from 03/23/17 and CMET results from today (05/02).  Wylene Simmer, BSN, RN 04/13/2017 10:11 AM

## 2017-04-13 NOTE — Progress Notes (Signed)
[facility] OFFICE PROGRESS NOTE   Diagnosis: Colon cancer  INTERVAL HISTORY:   Brianna [name] returns as scheduled. She completed another treatment with pembrolizumab on 03/23/2017. No rash or diarrhea. She reports pruritus at the ears, scalp, and abdomen. She had transient left abdominal pain a few days ago. She is having bowel movements. Good appetite and energy level.  Objective:  Vital signs in last 24 hours:  Blood pressure (!) 143/76, pulse 70, temperature 98.1 F (36.7 C), temperature source Oral, resp. rate 18, weight 112 lb 12.8 oz (51.2 kg), SpO2 96 %.    HEENT: No thrush Resp: Lungs clear bilaterally Cardio: Regular rate and rhythm GI: No hepatosplenomegaly, no mass, nontender Vascular: No leg edema  Skin: No rash over the ears or abdomen, few areas of faint erythema at the back   Lab Results:  Lab Results  Component Value Date   WBC 9.6 03/23/2017   HGB 10.9 (L) 03/23/2017   HCT 32.3 (L) 03/23/2017   MCV 97.9 03/23/2017   PLT 293 03/23/2017   NEUTROABS 6.3 03/23/2017     Medications: I have reviewed the patient's current medications.  Assessment/Plan: 1. Admission with fever and liver abscesses 06/19/2016  Status post placement of a liver drain on 06/21/2010, New drain placed 06/30/2016  Treated with broad-spectrum intravenous antibiotics, completed outpatient course of Augmentin   CT 07/20/2016-no residual abscess identified  CT 10/20/2016-improved appearance of the liver; no new liver lesions; apple core type lesion involving the mid transverse colon continuing for approximately 5.6 cm. No obstruction. Probable transmural disease extension especially anteriorly. Suspected enlarged nodes at the root of the transverse mesocolon; no pelvic sidewall adenopathy.  Chest CT 10/16/2017-interval development of multifocal ill-defined low attenuation lesions within the liver suspicious for recurrent abscesses.  Abdomen/pelvis CT  12/17/2016-numerous new scattered hypodense hepatic lesions. Necrotic conglomerate central mesenteric adenopathy. Questionable omental deposits of tumor. Borderline adenopathy in the retrocrural region bilaterally. Diffuse mesenteric edema.  2. 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 Hyperbilirubinemia secondary to hemolysis and liver abscesses  5. CT abdomen/pelvis 10/20/2016-improved appearance of the liver; no new liver lesions; apple core type lesion involving the mid transverse colon continuing for approximately 5.6 cm. No obstruction. Probable transmural disease extension especially anteriorly. Suspected enlarged nodes at the root of the transverse mesocolon;no pelvic sidewall adenopathy.  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 Necrotic conglomerate central mesenteric adenopathy. Questionable omental deposits of tumor. Borderline adenopathy in the retrocrural region bilaterally. Diffuse mesenteric edema.  Cycle 1  Pembrolizumab 12/22/2016  Cycle 2 Pembrolizumab01/31/2018  Cycle 3 Pembrolizumab 02/09/2017  Cycle 4 Pembrolizumab 03/02/2017  CTs 03/21/2017-decrease in liver lesions, resolution of mesenteric adenopathy, new nonspecific tiny right lower lobe nodule  Cycle 5 Pembrolizumab 03/23/2017   Cycle 6 Pembrolizumab  04/13/2017   6. 6 month history of left mid back pain with recent worsening. CT chest 12/16/2016 showed no specific etiology for the pain. Interval development of multifocal ill-defined lesions within the liver suspicious for recurrent abscesses noted. 13 mm nodular density right middle lobe likely post infectious or inflammatory. Multifocal areas of bronchiectasis.12/17/2016-the pain may be related to the mesenteric mass. Pain resolved with Pembrolizumab treatment   Disposition:  She appears stable. She has an excellent performance status and appears to be tolerating the immunotherapy well. I doubt the pruritus is related to the pembrolizumab. She will contact us for a rash or increased pruritus.  [name] will return for an office visit and treatment in 3 weeks.  15 minutes were spent with the patient today. The majority of the time was used for counseling and coordination of care.  Betsy Coder, MD  04/13/2017  8:56 AM

## 2017-04-13 NOTE — Telephone Encounter (Signed)
Spoke with patient and let her know that repeat K+ was in normal range.  She very much appreciated the call.

## 2017-04-14 LAB — T3 UPTAKE
FREE THYROXINE INDEX: 1.6 (ref 1.2–4.9)
T3 Uptake Ratio: 26 % (ref 24–39)

## 2017-04-14 LAB — T4, FREE: T4,Free(Direct): 1.1 ng/dL (ref 0.82–1.77)

## 2017-04-14 LAB — T4: T4 TOTAL: 6 ug/dL (ref 4.5–12.0)

## 2017-04-15 ENCOUNTER — Telehealth: Payer: Self-pay

## 2017-04-15 NOTE — Telephone Encounter (Signed)
Pt called requesting appt be moved to late in the day if possible. inbasket sent.

## 2017-04-18 ENCOUNTER — Telehealth: Payer: Self-pay

## 2017-04-18 NOTE — Telephone Encounter (Signed)
Pt called to keep 5/23 appt as is. inbasket message retracted.

## 2017-04-18 NOTE — Telephone Encounter (Signed)
Per Dr Benay Spice OV note pt is to be scheduled on 6/13. That is his day off. Is he going to open it up. She discussed with Dr Benay Spice for appt to be at 1100 am. Dr Benay Spice did not specify in his LOS for 11 oclock. She is asking for RN to notify her. Her daughter flies down from [place] to be here for her visits.

## 2017-04-20 NOTE — Telephone Encounter (Signed)
Late entry for 04/19/17: spoke with pt, informed her of 6/13 appointments.

## 2017-04-30 ENCOUNTER — Other Ambulatory Visit: Payer: Self-pay | Admitting: Oncology

## 2017-05-04 ENCOUNTER — Ambulatory Visit (HOSPITAL_BASED_OUTPATIENT_CLINIC_OR_DEPARTMENT_OTHER): Payer: Medicare Other | Admitting: Nurse Practitioner

## 2017-05-04 ENCOUNTER — Other Ambulatory Visit (HOSPITAL_BASED_OUTPATIENT_CLINIC_OR_DEPARTMENT_OTHER): Payer: Medicare Other

## 2017-05-04 ENCOUNTER — Telehealth: Payer: Self-pay | Admitting: Nurse Practitioner

## 2017-05-04 ENCOUNTER — Ambulatory Visit (HOSPITAL_BASED_OUTPATIENT_CLINIC_OR_DEPARTMENT_OTHER): Payer: Medicare Other

## 2017-05-04 VITALS — BP 138/82 | HR 96 | Temp 98.7°F | Resp 18 | Ht 63.0 in | Wt 113.6 lb

## 2017-05-04 DIAGNOSIS — D591 Other autoimmune hemolytic anemias: Secondary | ICD-10-CM | POA: Diagnosis not present

## 2017-05-04 DIAGNOSIS — L299 Pruritus, unspecified: Secondary | ICD-10-CM | POA: Diagnosis not present

## 2017-05-04 DIAGNOSIS — C184 Malignant neoplasm of transverse colon: Secondary | ICD-10-CM

## 2017-05-04 DIAGNOSIS — Z5112 Encounter for antineoplastic immunotherapy: Secondary | ICD-10-CM | POA: Diagnosis present

## 2017-05-04 DIAGNOSIS — R209 Unspecified disturbances of skin sensation: Secondary | ICD-10-CM | POA: Diagnosis not present

## 2017-05-04 LAB — CBC WITH DIFFERENTIAL/PLATELET
BASO%: 0.4 % (ref 0.0–2.0)
Basophils Absolute: 0 10*3/uL (ref 0.0–0.1)
EOS%: 1.8 % (ref 0.0–7.0)
Eosinophils Absolute: 0.2 10*3/uL (ref 0.0–0.5)
HCT: 33.3 % — ABNORMAL LOW (ref 34.8–46.6)
HEMOGLOBIN: 11 g/dL — AB (ref 11.6–15.9)
LYMPH#: 2.5 10*3/uL (ref 0.9–3.3)
LYMPH%: 26.5 % (ref 14.0–49.7)
MCH: 32.5 pg (ref 25.1–34.0)
MCHC: 33 g/dL (ref 31.5–36.0)
MCV: 98.5 fL (ref 79.5–101.0)
MONO#: 0.8 10*3/uL (ref 0.1–0.9)
MONO%: 8 % (ref 0.0–14.0)
NEUT%: 63.3 % (ref 38.4–76.8)
NEUTROS ABS: 6.1 10*3/uL (ref 1.5–6.5)
NRBC: 0 % (ref 0–0)
Platelets: 247 10*3/uL (ref 145–400)
RBC: 3.38 10*6/uL — ABNORMAL LOW (ref 3.70–5.45)
RDW: 13 % (ref 11.2–14.5)
WBC: 9.6 10*3/uL (ref 3.9–10.3)

## 2017-05-04 LAB — COMPREHENSIVE METABOLIC PANEL
ALT: 16 U/L (ref 0–55)
AST: 16 U/L (ref 5–34)
Albumin: 3.9 g/dL (ref 3.5–5.0)
Alkaline Phosphatase: 58 U/L (ref 40–150)
Anion Gap: 8 mEq/L (ref 3–11)
BILIRUBIN TOTAL: 1.79 mg/dL — AB (ref 0.20–1.20)
BUN: 15.3 mg/dL (ref 7.0–26.0)
CO2: 28 meq/L (ref 22–29)
Calcium: 9.5 mg/dL (ref 8.4–10.4)
Chloride: 100 mEq/L (ref 98–109)
Creatinine: 0.7 mg/dL (ref 0.6–1.1)
EGFR: 72 mL/min/{1.73_m2} — AB (ref >90)
GLUCOSE: 92 mg/dL (ref 70–140)
Potassium: 4.9 mEq/L (ref 3.5–5.1)
SODIUM: 136 meq/L (ref 136–145)
TOTAL PROTEIN: 7.4 g/dL (ref 6.4–8.3)

## 2017-05-04 MED ORDER — PEMBROLIZUMAB CHEMO INJECTION 100 MG/4ML
200.0000 mg | Freq: Once | INTRAVENOUS | Status: AC
  Administered 2017-05-04: 200 mg via INTRAVENOUS
  Filled 2017-05-04: qty 8

## 2017-05-04 MED ORDER — SODIUM CHLORIDE 0.9 % IV SOLN
Freq: Once | INTRAVENOUS | Status: AC
  Administered 2017-05-04: 10:00:00 via INTRAVENOUS

## 2017-05-04 NOTE — Telephone Encounter (Signed)
Gave patient AVS and calender per 5/23 LOS  

## 2017-05-04 NOTE — Patient Instructions (Signed)
[facility] Discharge Instructions for Patients Receiving Chemotherapy  Today you received the following chemotherapy agents: Keytruda   To help prevent nausea and vomiting after your treatment, we encourage you to take your nausea medication as directed    If you develop nausea and vomiting that is not controlled by your nausea medication, call the clinic.   BELOW ARE SYMPTOMS THAT SHOULD BE REPORTED IMMEDIATELY:  *FEVER GREATER THAN 100.5 F  *CHILLS WITH OR WITHOUT FEVER  NAUSEA AND VOMITING THAT IS NOT CONTROLLED WITH YOUR NAUSEA MEDICATION  *UNUSUAL SHORTNESS OF BREATH  *UNUSUAL BRUISING OR BLEEDING  TENDERNESS IN MOUTH AND THROAT WITH OR WITHOUT PRESENCE OF ULCERS  *URINARY PROBLEMS  *BOWEL PROBLEMS  UNUSUAL RASH Items with * indicate a potential emergency and should be followed up as soon as possible.  Feel free to call the clinic you have any questions or concerns. The clinic phone number is [phone].  Please show the CHEMO ALERT CARD at check-in to the Emergency Department and triage nurse.   

## 2017-05-04 NOTE — Progress Notes (Signed)
[facility] OFFICE PROGRESS NOTE   Diagnosis:  Colon cancer  INTERVAL HISTORY:   [name] returns as scheduled. She completed another cycle of Pembrolizumab 04/13/2017. She feels well. She has a good appetite and good energy level. She denies pain. No nausea or vomiting. No mouth sores. No diarrhea. No shortness of breath. No fever. No rash. She continues to note intermittent pruritus over the abdomen, outer ears and breast. Beginning about 4 days ago after talking on the phone for 45 minutes she has noted intermittent numbness and a pin/needle sensation in the right arm. No weakness. She denies neck pain. She wonders if she strained a muscle.  Objective:  Vital signs in last 24 hours:  Blood pressure 138/82, pulse 96, temperature 98.7 F (37.1 C), temperature source Oral, resp. rate 18, height _0  (1.6 m), weight 113 lb 9.6 oz (51.5 kg), SpO2 100 %.    HEENT: No thrush or ulcers. Resp: Lungs clear bilaterally. Cardio: Regular rate and rhythm. GI: Abdomen soft and nontender. No hepatomegaly. Vascular: No leg edema. Calves soft and nontender. Neuro: Upper extremity motor strength 5 over 5.  Skin: No rash over the ears or abdomen.    Lab Results:  Lab Results  Component Value Date   WBC 9.6 05/04/2017   HGB 11.0 (L) 05/04/2017   HCT 33.3 (L) 05/04/2017   MCV 98.5 05/04/2017   PLT 247 05/04/2017   NEUTROABS 6.1 05/04/2017    Imaging:  No results found.  Medications: I have reviewed the patient's current medications.  Assessment/Plan: 1. Admission with fever and liver abscesses 06/19/2016  Status post placement of a liver drain on 06/21/2010, New drain placed 06/30/2016  Treated with broad-spectrum intravenous antibiotics, completed outpatient course of Augmentin   CT 07/20/2016-no residual abscess identified  CT 10/20/2016-improved appearance of the liver; no new liver lesions; apple core type lesion involving the mid transverse colon  continuing for approximately 5.6 cm. No obstruction. Probable transmural disease extension especially anteriorly. Suspected enlarged nodes at the root of the transverse mesocolon; no pelvic sidewall adenopathy.  Chest CT 10/16/2017-interval development of multifocal ill-defined low attenuation lesions within the liver suspicious for recurrent abscesses.  Abdomen/pelvis CT 12/17/2016-numerous new scattered hypodense hepatic lesions. Necrotic conglomerate central mesenteric adenopathy. Questionable omental deposits of tumor. Borderline adenopathy in the retrocrural region bilaterally. Diffuse mesenteric edema.  2. 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 Hyperbilirubinemia secondary to hemolysis and liver abscesses  5. CT abdomen/pelvis 10/20/2016-improved appearance of the liver; no new liver lesions; apple core type lesion involving the mid transverse colon continuing for approximately 5.6 cm. No obstruction. Probable transmural disease extension especially anteriorly. Suspected enlarged nodes at the root of the transverse mesocolon;no pelvic sidewall adenopathy.  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 Necrotic conglomerate central mesenteric adenopathy. Questionable omental deposits of tumor. Borderline adenopathy in the retrocrural region bilaterally. Diffuse mesenteric edema.  Cycle 1 Pembrolizumab 12/22/2016  Cycle 2 Pembrolizumab01/31/2018  Cycle 3 Pembrolizumab 02/09/2017  Cycle 4 Pembrolizumab 03/02/2017  CTs 03/21/2017-decrease in liver lesions, resolution of mesenteric adenopathy, new nonspecific tiny right lower lobe nodule  Cycle 5 Pembrolizumab 03/23/2017   Cycle 6 Pembrolizumab 04/13/2017   Cycle 7 Pembrolizumab 05/04/2017  6. 6 month history of left mid back pain with recent worsening. CT chest 12/16/2016 showed no specific etiology for the pain. Interval development of multifocal ill-defined lesions within the liver suspicious for recurrent abscesses noted. 13 mm nodular density right middle lobe likely post infectious or inflammatory. Multifocal areas of bronchiectasis.12/17/2016-the pain may be related to the mesenteric mass. Pain resolved with Pembrolizumab treatment  7. Intermittent pruritus. No rash.    Disposition: Brianna [name] appears stable. She has completed 6 cycles of Pembrolizumab. Plan to proceed with cycle 7 today as scheduled. She will return for a follow-up visit and treatment in 3 weeks. She will contact the office in the interim with any problems. We specifically discussed right arm weakness, persistent numbness.  Plan reviewed with Dr. Benay Spice.    Ned Card ANP/GNP-BC   05/04/2017  10:16 AM

## 2017-05-06 ENCOUNTER — Telehealth: Payer: Self-pay | Admitting: *Deleted

## 2017-05-06 NOTE — Telephone Encounter (Signed)
Message received from patient requesting a call back from Dr. Gearldine Shown nurse.  Call placed back to patient and patient wanted to report to MD that she had nausea during the night for a short period of time after her Keytruda infusion on Wednesday along with one time diarrhea that AM.  She states that she has had a normal BM this AM and no further nausea.  Patient appreciative of call back and has no further complaints at this time.

## 2017-05-15 ENCOUNTER — Encounter: Payer: Self-pay | Admitting: Oncology

## 2017-05-15 ENCOUNTER — Encounter: Payer: Self-pay | Admitting: Genetic Counselor

## 2017-05-15 ENCOUNTER — Telehealth: Payer: Self-pay | Admitting: Genetic Counselor

## 2017-05-15 NOTE — Telephone Encounter (Signed)
Appt has been scheduled for the pt to see Santiago Glad for genetics on 6/27 at 2pm. Attempted to call the pt, but no answer. Will mail the pt a letter w/the appt date and time.

## 2017-05-22 ENCOUNTER — Other Ambulatory Visit: Payer: Self-pay | Admitting: Oncology

## 2017-05-25 ENCOUNTER — Telehealth: Payer: Self-pay | Admitting: Nurse Practitioner

## 2017-05-25 ENCOUNTER — Ambulatory Visit (HOSPITAL_BASED_OUTPATIENT_CLINIC_OR_DEPARTMENT_OTHER): Payer: Medicare Other | Admitting: Nurse Practitioner

## 2017-05-25 ENCOUNTER — Other Ambulatory Visit (HOSPITAL_BASED_OUTPATIENT_CLINIC_OR_DEPARTMENT_OTHER): Payer: Medicare Other

## 2017-05-25 ENCOUNTER — Ambulatory Visit (HOSPITAL_BASED_OUTPATIENT_CLINIC_OR_DEPARTMENT_OTHER): Payer: Medicare Other

## 2017-05-25 ENCOUNTER — Telehealth: Payer: Self-pay | Admitting: *Deleted

## 2017-05-25 VITALS — BP 176/66

## 2017-05-25 VITALS — BP 156/98 | HR 79 | Temp 98.9°F | Resp 18 | Ht 63.0 in | Wt 113.8 lb

## 2017-05-25 DIAGNOSIS — L299 Pruritus, unspecified: Secondary | ICD-10-CM

## 2017-05-25 DIAGNOSIS — C184 Malignant neoplasm of transverse colon: Secondary | ICD-10-CM | POA: Diagnosis present

## 2017-05-25 DIAGNOSIS — D591 Other autoimmune hemolytic anemias: Secondary | ICD-10-CM

## 2017-05-25 DIAGNOSIS — Z5112 Encounter for antineoplastic immunotherapy: Secondary | ICD-10-CM

## 2017-05-25 DIAGNOSIS — K769 Liver disease, unspecified: Secondary | ICD-10-CM

## 2017-05-25 DIAGNOSIS — I10 Essential (primary) hypertension: Secondary | ICD-10-CM | POA: Diagnosis not present

## 2017-05-25 DIAGNOSIS — R209 Unspecified disturbances of skin sensation: Secondary | ICD-10-CM | POA: Diagnosis not present

## 2017-05-25 DIAGNOSIS — M549 Dorsalgia, unspecified: Secondary | ICD-10-CM

## 2017-05-25 LAB — CBC WITH DIFFERENTIAL/PLATELET
BASO%: 0.4 % (ref 0.0–2.0)
Basophils Absolute: 0 10*3/uL (ref 0.0–0.1)
EOS%: 1 % (ref 0.0–7.0)
Eosinophils Absolute: 0.1 10*3/uL (ref 0.0–0.5)
HCT: 35.2 % (ref 34.8–46.6)
HGB: 11.8 g/dL (ref 11.6–15.9)
LYMPH#: 2.5 10*3/uL (ref 0.9–3.3)
LYMPH%: 24.9 % (ref 14.0–49.7)
MCH: 33 pg (ref 25.1–34.0)
MCHC: 33.5 g/dL (ref 31.5–36.0)
MCV: 98.3 fL (ref 79.5–101.0)
MONO#: 0.8 10*3/uL (ref 0.1–0.9)
MONO%: 8.4 % (ref 0.0–14.0)
NEUT#: 6.6 10*3/uL — ABNORMAL HIGH (ref 1.5–6.5)
NEUT%: 65.3 % (ref 38.4–76.8)
Platelets: 271 10*3/uL (ref 145–400)
RBC: 3.58 10*6/uL — AB (ref 3.70–5.45)
RDW: 12.9 % (ref 11.2–14.5)
WBC: 10 10*3/uL (ref 3.9–10.3)
nRBC: 0 % (ref 0–0)

## 2017-05-25 LAB — COMPREHENSIVE METABOLIC PANEL
ALT: 16 U/L (ref 0–55)
AST: 18 U/L (ref 5–34)
Albumin: 3.9 g/dL (ref 3.5–5.0)
Alkaline Phosphatase: 61 U/L (ref 40–150)
Anion Gap: 8 mEq/L (ref 3–11)
BUN: 15.3 mg/dL (ref 7.0–26.0)
CHLORIDE: 99 meq/L (ref 98–109)
CO2: 30 mEq/L — ABNORMAL HIGH (ref 22–29)
Calcium: 9.7 mg/dL (ref 8.4–10.4)
Creatinine: 0.7 mg/dL (ref 0.6–1.1)
EGFR: 71 mL/min/{1.73_m2} — AB (ref >90)
GLUCOSE: 91 mg/dL (ref 70–140)
POTASSIUM: 4.8 meq/L (ref 3.5–5.1)
Sodium: 137 mEq/L (ref 136–145)
Total Bilirubin: 2.06 mg/dL — ABNORMAL HIGH (ref 0.20–1.20)
Total Protein: 7.7 g/dL (ref 6.4–8.3)

## 2017-05-25 MED ORDER — PEMBROLIZUMAB CHEMO INJECTION 100 MG/4ML
200.0000 mg | Freq: Once | INTRAVENOUS | Status: AC
  Administered 2017-05-25: 200 mg via INTRAVENOUS
  Filled 2017-05-25: qty 8

## 2017-05-25 MED ORDER — SODIUM CHLORIDE 0.9 % IV SOLN
Freq: Once | INTRAVENOUS | Status: AC
  Administered 2017-05-25: 13:00:00 via INTRAVENOUS

## 2017-05-25 NOTE — Patient Instructions (Signed)
[facility] Discharge Instructions for Patients Receiving Chemotherapy  Today you received the following chemotherapy agents:  Keytruda.  To help prevent nausea and vomiting after your treatment, we encourage you to take your nausea medication as prescribed.   If you develop nausea and vomiting that is not controlled by your nausea medication, call the clinic.   BELOW ARE SYMPTOMS THAT SHOULD BE REPORTED IMMEDIATELY:  *FEVER GREATER THAN 100.5 F  *CHILLS WITH OR WITHOUT FEVER  NAUSEA AND VOMITING THAT IS NOT CONTROLLED WITH YOUR NAUSEA MEDICATION  *UNUSUAL SHORTNESS OF BREATH  *UNUSUAL BRUISING OR BLEEDING  TENDERNESS IN MOUTH AND THROAT WITH OR WITHOUT PRESENCE OF ULCERS  *URINARY PROBLEMS  *BOWEL PROBLEMS  UNUSUAL RASH Items with * indicate a potential emergency and should be followed up as soon as possible.  Feel free to call the clinic you have any questions or concerns. The clinic phone number is [phone].  Please show the CHEMO ALERT CARD at check-in to the Emergency Department and triage nurse.   

## 2017-05-25 NOTE — Progress Notes (Signed)
Ok to treat with T bili 2.06 per Dr. Benay Spice.

## 2017-05-25 NOTE — Telephone Encounter (Signed)
Scheduled appt per 6/13 los - Gave patient AVS and calender.  

## 2017-05-25 NOTE — Progress Notes (Signed)
[place] OFFICE PROGRESS NOTE   Diagnosis:  Colon cancer  INTERVAL HISTORY:   Brianna [name] returns as scheduled. She completed another cycle of Pembrolizumab 05/04/2017. She denies nausea/vomiting. No mouth sores. No diarrhea. She continues to experience intermittent pruritus involving various areas. The pruritus tends to occur during the nighttime. She notes improvement with Benadryl. No rash. The pin/needle sensation involving the right arm is occurring less frequently. No associated pain or weakness.  We discussed the elevated blood pressure in the office today. She reports eating a meal last night that was high in salt content. She wonders if this is the reason for the elevated blood pressure. She reports good compliance with her blood pressure medication.  Objective:  Vital signs in last 24 hours:  Blood pressure (!) 156/98, pulse 79, temperature 98.9 F (37.2 C), temperature source Oral, resp. rate 18, height _0  (1.6 m), weight 113 lb 12.8 oz (51.6 kg), SpO2 100 %.    HEENT: No thrush or ulcers. Resp: Lungs clear bilaterally. Cardio: Regular rate and rhythm. GI: Abdomen soft and nontender. No hepatomegaly. Vascular: No leg edema. Neuro: Alert and oriented. Upper extremity strength intact. Skin: No rash. Ecchymosis left pretibial region.    Lab Results:  Lab Results  Component Value Date   WBC 10.0 05/25/2017   HGB 11.8 05/25/2017   HCT 35.2 05/25/2017   MCV 98.3 05/25/2017   PLT 271 05/25/2017   NEUTROABS 6.6 (H) 05/25/2017    Imaging:  No results found.  Medications: I have reviewed the patient's current medications.  Assessment/Plan: 1. Admission with fever and liver abscesses 06/19/2016  Status post placement of a liver drain on 06/21/2010, New drain placed 06/30/2016  Treated with broad-spectrum intravenous antibiotics, completed outpatient course of Augmentin   CT 07/20/2016-no residual abscess identified  CT  10/20/2016-improved appearance of the liver; no new liver lesions; apple core type lesion involving the mid transverse colon continuing for approximately 5.6 cm. No obstruction. Probable transmural disease extension especially anteriorly. Suspected enlarged nodes at the root of the transverse mesocolon; no pelvic sidewall adenopathy.  Chest CT 10/16/2017-interval development of multifocal ill-defined low attenuation lesions within the liver suspicious for recurrent abscesses.  Abdomen/pelvis CT 12/17/2016-numerous new scattered hypodense hepatic lesions. Necrotic conglomerate central mesenteric adenopathy. Questionable omental deposits of tumor. Borderline adenopathy in the retrocrural region bilaterally. Diffuse mesenteric edema.  2. 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 Hemoglobin in normal range 05/25/2017.  4. Hyperbilirubinemia secondary to hemolysis and liver abscesses  5. CT abdomen/pelvis 10/20/2016-improved appearance of the liver; no new liver lesions; apple core type lesion involving the mid transverse colon continuing for approximately 5.6 cm. No obstruction. Probable transmural disease extension especially anteriorly. Suspected enlarged nodes at the root of the transverse mesocolon;no pelvic sidewall adenopathy.  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 Necrotic conglomerate central mesenteric adenopathy. Questionable omental deposits of tumor. Borderline adenopathy in the retrocrural region bilaterally. Diffuse mesenteric edema.  Cycle 1 Pembrolizumab 12/22/2016  Cycle 2 Pembrolizumab01/31/2018  Cycle 3 Pembrolizumab 02/09/2017  Cycle 4 Pembrolizumab 03/02/2017  CTs 03/21/2017-decrease in liver lesions, resolution of mesenteric adenopathy, new nonspecific tiny right lower lobe nodule  Cycle 5 Pembrolizumab 03/23/2017   Cycle 6 Pembrolizumab 04/13/2017  Cycle 7 Pembrolizumab 05/04/2017  Cycle 8 Pembrolizumab 05/25/2017  6. 6 month history of left mid back pain with recent worsening. CT chest 12/16/2016 showed no specific etiology for the pain. Interval development of multifocal ill-defined lesions within the liver suspicious for recurrent abscesses noted. 13 mm nodular density right middle lobe likely post infectious or inflammatory. Multifocal areas of bronchiectasis.12/17/2016-the pain may be related to the mesenteric mass. Pain resolved with Pembrolizumab treatment  7. Intermittent pruritus. No rash.    Disposition: Brianna [name] appears stable. She has completed 7 cycles of Pembrolizumab. She continues to have an improved performance status and there is no evidence of disease progression. Plan to proceed with cycle 8 today as scheduled.  Blood pressure was elevated while in the office today. She reports taking her blood pressure medication this morning. She will monitor this at home and contact her PCP with consistent elevation.  She will return for a follow-up visit and  Pembrolizumab in 3 weeks. She will contact the office in the interim with any problems.  Plan reviewed with Dr. Benay Spice.    Ned Card ANP/GNP-BC   05/25/2017  12:02 PM

## 2017-06-07 ENCOUNTER — Encounter: Payer: Self-pay | Admitting: Genetic Counselor

## 2017-06-07 DIAGNOSIS — Z1379 Encounter for other screening for genetic and chromosomal anomalies: Secondary | ICD-10-CM | POA: Insufficient documentation

## 2017-06-08 ENCOUNTER — Encounter: Payer: Medicare Other | Admitting: Genetic Counselor

## 2017-06-08 ENCOUNTER — Other Ambulatory Visit: Payer: Medicare Other

## 2017-06-08 ENCOUNTER — Telehealth: Payer: Self-pay | Admitting: *Deleted

## 2017-06-08 NOTE — Telephone Encounter (Signed)
Call from pt requesting medical records to be sent to [facility]. They will not schedule appt until they receive records. Message to Maudie Mercury in HIM to forward records.

## 2017-06-09 ENCOUNTER — Telehealth: Payer: Self-pay | Admitting: Oncology

## 2017-06-09 NOTE — Telephone Encounter (Signed)
Faxed records to Dr Lyda Kalata [phone]

## 2017-06-10 ENCOUNTER — Telehealth: Payer: Self-pay | Admitting: Oncology

## 2017-06-10 NOTE — Telephone Encounter (Signed)
Confirmed appointment changes with patient

## 2017-06-12 ENCOUNTER — Other Ambulatory Visit: Payer: Self-pay | Admitting: Oncology

## 2017-06-14 ENCOUNTER — Other Ambulatory Visit: Payer: Medicare Other

## 2017-06-16 ENCOUNTER — Ambulatory Visit: Payer: Medicare Other | Admitting: Oncology

## 2017-06-16 ENCOUNTER — Ambulatory Visit: Payer: Medicare Other

## 2017-06-17 ENCOUNTER — Other Ambulatory Visit (HOSPITAL_BASED_OUTPATIENT_CLINIC_OR_DEPARTMENT_OTHER): Payer: Medicare Other

## 2017-06-17 ENCOUNTER — Ambulatory Visit (HOSPITAL_BASED_OUTPATIENT_CLINIC_OR_DEPARTMENT_OTHER): Payer: Medicare Other | Admitting: Oncology

## 2017-06-17 ENCOUNTER — Telehealth: Payer: Self-pay | Admitting: Oncology

## 2017-06-17 ENCOUNTER — Ambulatory Visit (HOSPITAL_BASED_OUTPATIENT_CLINIC_OR_DEPARTMENT_OTHER): Payer: Medicare Other

## 2017-06-17 VITALS — BP 168/70 | HR 80 | Temp 98.2°F | Resp 19 | Ht 63.0 in | Wt 114.1 lb

## 2017-06-17 DIAGNOSIS — C184 Malignant neoplasm of transverse colon: Secondary | ICD-10-CM

## 2017-06-17 DIAGNOSIS — C787 Secondary malignant neoplasm of liver and intrahepatic bile duct: Secondary | ICD-10-CM

## 2017-06-17 DIAGNOSIS — Z5112 Encounter for antineoplastic immunotherapy: Secondary | ICD-10-CM

## 2017-06-17 DIAGNOSIS — Z79899 Other long term (current) drug therapy: Secondary | ICD-10-CM | POA: Diagnosis not present

## 2017-06-17 LAB — COMPREHENSIVE METABOLIC PANEL
ALBUMIN: 3.9 g/dL (ref 3.5–5.0)
ALK PHOS: 61 U/L (ref 40–150)
ALT: 20 U/L (ref 0–55)
ANION GAP: 8 meq/L (ref 3–11)
AST: 22 U/L (ref 5–34)
BILIRUBIN TOTAL: 2.02 mg/dL — AB (ref 0.20–1.20)
BUN: 18.4 mg/dL (ref 7.0–26.0)
CALCIUM: 9.6 mg/dL (ref 8.4–10.4)
CO2: 29 meq/L (ref 22–29)
CREATININE: 0.8 mg/dL (ref 0.6–1.1)
Chloride: 100 mEq/L (ref 98–109)
EGFR: 68 mL/min/{1.73_m2} — ABNORMAL LOW (ref >90)
Glucose: 107 mg/dl (ref 70–140)
Potassium: 4.9 mEq/L (ref 3.5–5.1)
Sodium: 137 mEq/L (ref 136–145)
TOTAL PROTEIN: 7.6 g/dL (ref 6.4–8.3)

## 2017-06-17 LAB — CBC WITH DIFFERENTIAL/PLATELET
BASO%: 0.6 % (ref 0.0–2.0)
BASOS ABS: 0.1 10*3/uL (ref 0.0–0.1)
EOS ABS: 0.1 10*3/uL (ref 0.0–0.5)
EOS%: 0.6 % (ref 0.0–7.0)
HEMATOCRIT: 32.9 % — AB (ref 34.8–46.6)
HEMOGLOBIN: 11.1 g/dL — AB (ref 11.6–15.9)
LYMPH%: 23.5 % (ref 14.0–49.7)
MCH: 33.1 pg (ref 25.1–34.0)
MCHC: 33.7 g/dL (ref 31.5–36.0)
MCV: 98.2 fL (ref 79.5–101.0)
MONO#: 0.8 10*3/uL (ref 0.1–0.9)
MONO%: 7.7 % (ref 0.0–14.0)
NEUT#: 6.6 10*3/uL — ABNORMAL HIGH (ref 1.5–6.5)
NEUT%: 67.6 % (ref 38.4–76.8)
NRBC: 0 % (ref 0–0)
PLATELETS: 267 10*3/uL (ref 145–400)
RBC: 3.35 10*6/uL — ABNORMAL LOW (ref 3.70–5.45)
RDW: 13.2 % (ref 11.2–14.5)
WBC: 9.7 10*3/uL (ref 3.9–10.3)
lymph#: 2.3 10*3/uL (ref 0.9–3.3)

## 2017-06-17 LAB — TSH: TSH: 1.295 m[IU]/L (ref 0.308–3.960)

## 2017-06-17 MED ORDER — SODIUM CHLORIDE 0.9 % IV SOLN
200.0000 mg | Freq: Once | INTRAVENOUS | Status: AC
  Administered 2017-06-17: 200 mg via INTRAVENOUS
  Filled 2017-06-17: qty 8

## 2017-06-17 MED ORDER — SODIUM CHLORIDE 0.9 % IV SOLN
Freq: Once | INTRAVENOUS | Status: AC
  Administered 2017-06-17: 13:00:00 via INTRAVENOUS

## 2017-06-17 NOTE — Addendum Note (Signed)
Addended by: Cyndia Bent on: 06/17/2017 02:03 PM   Modules accepted: Orders

## 2017-06-17 NOTE — Telephone Encounter (Signed)
Scheduled appt per 7/6 los - Gave patient AVS and calender per los.  

## 2017-06-17 NOTE — Progress Notes (Signed)
Per Dr Benay Spice, [place] for treatment with tot. Billi 2.02

## 2017-06-17 NOTE — Patient Instructions (Signed)
[facility] Discharge Instructions for Patients Receiving Chemotherapy  Today you received the following chemotherapy agents:  Keytruda.  To help prevent nausea and vomiting after your treatment, we encourage you to take your nausea medication as prescribed.   If you develop nausea and vomiting that is not controlled by your nausea medication, call the clinic.   BELOW ARE SYMPTOMS THAT SHOULD BE REPORTED IMMEDIATELY:  *FEVER GREATER THAN 100.5 F  *CHILLS WITH OR WITHOUT FEVER  NAUSEA AND VOMITING THAT IS NOT CONTROLLED WITH YOUR NAUSEA MEDICATION  *UNUSUAL SHORTNESS OF BREATH  *UNUSUAL BRUISING OR BLEEDING  TENDERNESS IN MOUTH AND THROAT WITH OR WITHOUT PRESENCE OF ULCERS  *URINARY PROBLEMS  *BOWEL PROBLEMS  UNUSUAL RASH Items with * indicate a potential emergency and should be followed up as soon as possible.  Feel free to call the clinic you have any questions or concerns. The clinic phone number is [phone].  Please show the CHEMO ALERT CARD at check-in to the Emergency Department and triage nurse.   

## 2017-06-17 NOTE — Progress Notes (Signed)
[place] OFFICE PROGRESS NOTE   Diagnosis: Colon cancer  INTERVAL HISTORY:   Brianna [name] returns as scheduled. She completed another treatment with pembrolizumab on 05/25/2017. No rash or diarrhea. She has occasional abdominal cramping after eating fresh fruit. No other complaint. She plans to relocate to [place] next month.  Objective:  Vital signs in last 24 hours:  Blood pressure (!) 168/70, pulse 80, temperature 98.2 F (36.8 C), temperature source Oral, resp. rate 19, height 5' 3"  (1.6 m), weight 114 lb 1.6 oz (51.8 kg), SpO2 100 %.    HEENT: No thrush or ulcers Resp: Lungs clear bilaterally Cardio: Regular rate and rhythm GI: No hepatomegaly, no mass, nontender Vascular: No leg edema   Lab Results:  Lab Results  Component Value Date   WBC 9.7 06/17/2017   HGB 11.1 (L) 06/17/2017   HCT 32.9 (L) 06/17/2017   MCV 98.2 06/17/2017   PLT 267 06/17/2017   NEUTROABS 6.6 (H) 06/17/2017    CMP     Component Value Date/Time   NA 137 06/17/2017 1024   K 4.9 06/17/2017 1024   CL 98 (L) 03/23/2017 1139   CO2 29 06/17/2017 1024   GLUCOSE 107 06/17/2017 1024   BUN 18.4 06/17/2017 1024   CREATININE 0.8 06/17/2017 1024   CALCIUM 9.6 06/17/2017 1024   PROT 7.6 06/17/2017 1024   ALBUMIN 3.9 06/17/2017 1024   AST 22 06/17/2017 1024   ALT 20 06/17/2017 1024   ALKPHOS 61 06/17/2017 1024   BILITOT 2.02 (H) 06/17/2017 1024   GFRNONAA >60 03/23/2017 1139   GFRNONAA 84 01/02/2014 1226   GFRAA >60 03/23/2017 1139   GFRAA >89 01/02/2014 1226     Medications: I have reviewed the patient's current medications.  Assessment/Plan: 1. Admission with fever and liver abscesses 06/19/2016  Status post placement of a liver drain on 06/21/2010, New drain placed 06/30/2016  Treated with broad-spectrum intravenous antibiotics, completed outpatient course of Augmentin   CT 07/20/2016-no residual abscess identified  CT 10/20/2016-improved appearance of the  liver; no new liver lesions; apple core type lesion involving the mid transverse colon continuing for approximately 5.6 cm. No obstruction. Probable transmural disease extension especially anteriorly. Suspected enlarged nodes at the root of the transverse mesocolon; no pelvic sidewall adenopathy.  Chest CT 10/16/2017-interval development of multifocal ill-defined low attenuation lesions within the liver suspicious for recurrent abscesses.  Abdomen/pelvis CT 12/17/2016-numerous new scattered hypodense hepatic lesions. Necrotic conglomerate central mesenteric adenopathy. Questionable omental deposits of tumor. Borderline adenopathy in the retrocrural region bilaterally. Diffuse mesenteric edema.  2. 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 Hemoglobin in normal range 05/25/2017.  4. Hyperbilirubinemia secondary to hemolysis and liver abscesses  5. CT abdomen/pelvis 10/20/2016-improved appearance of the liver; no new liver lesions; apple core type lesion involving the mid transverse colon continuing for approximately 5.6 cm. No obstruction. Probable transmural disease extension especially anteriorly. Suspected enlarged nodes at the root of the transverse mesocolon;no pelvic sidewall adenopathy.  Colonoscopy 11/11/2016 confirmed a transverse colon mass, biopsy revealed adenocarcinoma  Status post right colectomy 11/25/2016 (invasive poorly differentiated adenocarcinoma spanning 8 cm; tumor invades through muscularis propria into subserosal  soft tissues including involvement  of mesenteric fat; lymph/vascular invasion identified; margins negative; 6 of 14 lymph nodes positive  Loss of major and minor MMR proteins MLH1 and PMS2  MSI - HIGH   BRAF V600E mutation  Abdomen/pelvis CT 12/17/2016-numerous new scattered hypodense hepatic lesions. Necrotic conglomerate central mesenteric adenopathy. Questionable omental deposits of tumor. Borderline adenopathy in the retrocrural region bilaterally. Diffuse mesenteric edema.  Cycle 1 Pembrolizumab 12/22/2016  Cycle 2 Pembrolizumab01/31/2018  Cycle 3 Pembrolizumab 02/09/2017  Cycle 4 Pembrolizumab 03/02/2017  CTs 03/21/2017-decrease in liver lesions, resolution of mesenteric adenopathy, new nonspecific tiny right lower lobe nodule  Cycle 5 Pembrolizumab 03/23/2017   Cycle 6 Pembrolizumab 04/13/2017  Cycle7 Pembrolizumab 05/04/2017  Cycle 8 Pembrolizumab 05/25/2017  Cycle 9Pembrolizumab 06/17/2017   6. 6 month history of left mid back pain with recent worsening. CT chest 12/16/2016 showed no specific etiology for the pain. Interval development of multifocal ill-defined lesions within the liver suspicious for recurrent abscesses noted. 13 mm nodular density right middle lobe likely post infectious or inflammatory. Multifocal areas of bronchiectasis.12/17/2016-the pain may be related to the mesenteric mass. Pain resolved with Pembrolizumab treatment  7. Intermittent pruritus. No rash.    Disposition:  She appears well. No apparent toxicity from the Pembrolizumab. The plan is to continue pembrolizumab every 3 weeks. Brianna [name] will return for an office visit and treatment in 3 weeks.  15 minutes were spent with the patient today. The majority of the time was used for counseling and coordination of care.    Donneta Romberg, MD  06/17/2017  11:53 AM

## 2017-06-18 LAB — T4: T4 TOTAL: 6.1 ug/dL (ref 4.5–12.0)

## 2017-06-18 LAB — T4, FREE: T4,Free(Direct): 1.08 ng/dL (ref 0.82–1.77)

## 2017-06-18 LAB — T3 UPTAKE
FREE THYROXINE INDEX: 1.5 (ref 1.2–4.9)
T3 UPTAKE RATIO: 25 % (ref 24–39)

## 2017-07-02 ENCOUNTER — Other Ambulatory Visit: Payer: Self-pay | Admitting: Oncology

## 2017-07-04 ENCOUNTER — Telehealth: Payer: Self-pay | Admitting: *Deleted

## 2017-07-04 NOTE — Telephone Encounter (Signed)
Call from pt reporting she cracked her front tooth. Her dentist is out of the office until 7/30. Pt asks if there is a dentist here that can see her- Left message for Dr. Ritta Slot office to see if they can work pt in.

## 2017-07-05 NOTE — Telephone Encounter (Signed)
Message from pt reporting she scheduled appt with covering provider for her dentist. She will see them 7/27.

## 2017-07-06 ENCOUNTER — Ambulatory Visit (HOSPITAL_BASED_OUTPATIENT_CLINIC_OR_DEPARTMENT_OTHER): Payer: Medicare Other | Admitting: Oncology

## 2017-07-06 ENCOUNTER — Ambulatory Visit (HOSPITAL_BASED_OUTPATIENT_CLINIC_OR_DEPARTMENT_OTHER): Payer: Medicare Other

## 2017-07-06 ENCOUNTER — Other Ambulatory Visit (HOSPITAL_BASED_OUTPATIENT_CLINIC_OR_DEPARTMENT_OTHER): Payer: Medicare Other

## 2017-07-06 VITALS — BP 188/100 | HR 87 | Temp 98.4°F | Resp 18 | Ht 63.0 in | Wt 114.4 lb

## 2017-07-06 VITALS — BP 167/71 | HR 71

## 2017-07-06 DIAGNOSIS — Z5112 Encounter for antineoplastic immunotherapy: Secondary | ICD-10-CM | POA: Diagnosis present

## 2017-07-06 DIAGNOSIS — I10 Essential (primary) hypertension: Secondary | ICD-10-CM | POA: Diagnosis not present

## 2017-07-06 DIAGNOSIS — C787 Secondary malignant neoplasm of liver and intrahepatic bile duct: Secondary | ICD-10-CM | POA: Diagnosis not present

## 2017-07-06 DIAGNOSIS — C184 Malignant neoplasm of transverse colon: Secondary | ICD-10-CM

## 2017-07-06 DIAGNOSIS — L299 Pruritus, unspecified: Secondary | ICD-10-CM

## 2017-07-06 DIAGNOSIS — Z79899 Other long term (current) drug therapy: Secondary | ICD-10-CM

## 2017-07-06 LAB — CBC WITH DIFFERENTIAL/PLATELET
BASO%: 0.5 % (ref 0.0–2.0)
Basophils Absolute: 0.1 10*3/uL (ref 0.0–0.1)
EOS ABS: 0.1 10*3/uL (ref 0.0–0.5)
EOS%: 0.8 % (ref 0.0–7.0)
HCT: 33.8 % — ABNORMAL LOW (ref 34.8–46.6)
HEMOGLOBIN: 11.3 g/dL — AB (ref 11.6–15.9)
LYMPH#: 2.3 10*3/uL (ref 0.9–3.3)
LYMPH%: 21.6 % (ref 14.0–49.7)
MCH: 32.8 pg (ref 25.1–34.0)
MCHC: 33.4 g/dL (ref 31.5–36.0)
MCV: 98.3 fL (ref 79.5–101.0)
MONO#: 0.6 10*3/uL (ref 0.1–0.9)
MONO%: 5.9 % (ref 0.0–14.0)
NEUT%: 71.2 % (ref 38.4–76.8)
NEUTROS ABS: 7.6 10*3/uL — AB (ref 1.5–6.5)
PLATELETS: 320 10*3/uL (ref 145–400)
RBC: 3.44 10*6/uL — ABNORMAL LOW (ref 3.70–5.45)
RDW: 13.2 % (ref 11.2–14.5)
WBC: 10.6 10*3/uL — AB (ref 3.9–10.3)
nRBC: 0 % (ref 0–0)

## 2017-07-06 LAB — COMPREHENSIVE METABOLIC PANEL
ALBUMIN: 3.9 g/dL (ref 3.5–5.0)
ALK PHOS: 63 U/L (ref 40–150)
ALT: 14 U/L (ref 0–55)
ANION GAP: 10 meq/L (ref 3–11)
AST: 16 U/L (ref 5–34)
BILIRUBIN TOTAL: 1.84 mg/dL — AB (ref 0.20–1.20)
BUN: 15.8 mg/dL (ref 7.0–26.0)
CALCIUM: 10 mg/dL (ref 8.4–10.4)
CHLORIDE: 98 meq/L (ref 98–109)
CO2: 28 mEq/L (ref 22–29)
CREATININE: 0.7 mg/dL (ref 0.6–1.1)
EGFR: 72 mL/min/{1.73_m2} — ABNORMAL LOW (ref >90)
Glucose: 97 mg/dl (ref 70–140)
Potassium: 4.4 mEq/L (ref 3.5–5.1)
Sodium: 135 mEq/L — ABNORMAL LOW (ref 136–145)
Total Protein: 7.8 g/dL (ref 6.4–8.3)

## 2017-07-06 LAB — TSH: TSH: 1.212 m[IU]/L (ref 0.308–3.960)

## 2017-07-06 MED ORDER — SODIUM CHLORIDE 0.9 % IV SOLN
Freq: Once | INTRAVENOUS | Status: AC
  Administered 2017-07-06: 13:00:00 via INTRAVENOUS

## 2017-07-06 MED ORDER — ALPRAZOLAM 0.25 MG PO TABS: 0.2500 mg | ORAL_TABLET | Freq: Every evening | ORAL | 0 refills | Status: AC | PRN

## 2017-07-06 MED ORDER — PEMBROLIZUMAB CHEMO INJECTION 100 MG/4ML
200.0000 mg | Freq: Once | INTRAVENOUS | Status: AC
  Administered 2017-07-06: 200 mg via INTRAVENOUS
  Filled 2017-07-06: qty 8

## 2017-07-06 NOTE — Progress Notes (Signed)
Pt instructed to f/u with her PCP regarding elevated BP.  Pt expressed understanding.

## 2017-07-06 NOTE — Progress Notes (Signed)
[place] OFFICE PROGRESS NOTE   Diagnosis: Colon cancer  INTERVAL HISTORY:   Brianna [name] returns as scheduled. She completed another treatment with pembrolizumab on 06/17/2017. She reports intermittent numbness in the right arm when she applies pressure to the arm/hand. No weakness. No other complaint.  Objective:  Vital signs in last 24 hours:  Blood pressure (!) 188/100, pulse 87, temperature 98.4 F (36.9 C), temperature source Oral, resp. rate 18, height 5' 3"  (1.6 m), weight 114 lb 6.4 oz (51.9 kg), SpO2 100 %.    Resp: Lungs clear bilaterally Cardio: Regular rate and rhythm GI: No hepatomegaly, nontender Vascular: No leg edema Neuro: The motor exam appears intact in the upper extremities bilaterally      Lab Results:  Lab Results  Component Value Date   WBC 10.6 (H) 07/06/2017   HGB 11.3 (L) 07/06/2017   HCT 33.8 (L) 07/06/2017   MCV 98.3 07/06/2017   PLT 320 07/06/2017   NEUTROABS 7.6 (H) 07/06/2017    CMP     Component Value Date/Time   NA 135 (L) 07/06/2017 1108   K 4.4 07/06/2017 1108   CL 98 (L) 03/23/2017 1139   CO2 28 07/06/2017 1108   GLUCOSE 97 07/06/2017 1108   BUN 15.8 07/06/2017 1108   CREATININE 0.7 07/06/2017 1108   CALCIUM 10.0 07/06/2017 1108   PROT 7.8 07/06/2017 1108   ALBUMIN 3.9 07/06/2017 1108   AST 16 07/06/2017 1108   ALT 14 07/06/2017 1108   ALKPHOS 63 07/06/2017 1108   BILITOT 1.84 (H) 07/06/2017 1108   GFRNONAA >60 03/23/2017 1139   GFRNONAA 84 01/02/2014 1226   GFRAA >60 03/23/2017 1139   GFRAA >89 01/02/2014 1226     Medications: I have reviewed the patient's current medications.  Assessment/Plan: 1. Admission with fever and liver abscesses 06/19/2016  Status post placement of a liver drain on 06/21/2010, New drain placed 06/30/2016  Treated with broad-spectrum intravenous antibiotics, completed outpatient course of Augmentin   CT 07/20/2016-no residual abscess identified  CT  10/20/2016-improved appearance of the liver; no new liver lesions; apple core type lesion involving the mid transverse colon continuing for approximately 5.6 cm. No obstruction. Probable transmural disease extension especially anteriorly. Suspected enlarged nodes at the root of the transverse mesocolon; no pelvic sidewall adenopathy.  Chest CT 10/16/2017-interval development of multifocal ill-defined low attenuation lesions within the liver suspicious for recurrent abscesses.  Abdomen/pelvis CT 12/17/2016-numerous new scattered hypodense hepatic lesions. Necrotic conglomerate central mesenteric adenopathy. Questionable omental deposits of tumor. Borderline adenopathy in the retrocrural region bilaterally. Diffuse mesenteric edema.  2. 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 Hemoglobin in normal range 05/25/2017.  4. Hyperbilirubinemia secondary to hemolysis and liver abscesses  5. CT abdomen/pelvis 10/20/2016-improved appearance of the liver; no new liver lesions; apple core type lesion involving the mid transverse colon continuing for approximately 5.6 cm. No obstruction. Probable transmural disease extension especially anteriorly. Suspected enlarged nodes at the root of the transverse mesocolon;no pelvic sidewall adenopathy.  Colonoscopy 11/11/2016 confirmed a transverse colon mass, biopsy revealed adenocarcinoma  Status post right colectomy 11/25/2016 (invasive poorly differentiated adenocarcinoma spanning 8 cm; tumor invades  through muscularis propria into subserosal  soft tissues including involvement of mesenteric fat; lymph/vascular invasion identified; margins negative; 6 of 14 lymph nodes positive  Loss of major and minor MMR proteins MLH1 and PMS2  MSI - HIGH   BRAF V600E mutation  Abdomen/pelvis CT 12/17/2016-numerous new scattered hypodense hepatic lesions. Necrotic conglomerate central mesenteric adenopathy. Questionable omental deposits of tumor. Borderline adenopathy in the retrocrural region bilaterally. Diffuse mesenteric edema.  Cycle 1 Pembrolizumab 12/22/2016  Cycle 2 Pembrolizumab01/31/2018  Cycle 3 Pembrolizumab 02/09/2017  Cycle 4 Pembrolizumab 03/02/2017  CTs 03/21/2017-decrease in liver lesions, resolution of mesenteric adenopathy, new nonspecific tiny right lower lobe nodule  Cycle 5 Pembrolizumab 03/23/2017   Cycle 6 Pembrolizumab 04/13/2017  Cycle7 Pembrolizumab 05/04/2017  Cycle 8 Pembrolizumab 05/25/2017  Cycle 9 Pembrolizumab 06/17/2017   Cycle 10 Pembrolizumab 07/06/2017   6. 6 month history of left mid back pain with recent worsening. CT chest 12/16/2016 showed no specific etiology for the pain. Interval development of multifocal ill-defined lesions within the liver suspicious for recurrent abscesses noted. 13 mm nodular density right middle lobe likely post infectious or inflammatory. Multifocal areas of bronchiectasis.12/17/2016-the pain may be related to the mesenteric mass. Pain resolved with Pembrolizumab treatment  7. Intermittent pruritus. No rash.     Disposition:  She appears unchanged. She will complete another treatment with  pembrolizumab today. She will return for an office visit and treatment in 3 weeks. She plans to relocate to [place] in mid-August. She has significant hypertension today. Her blood pressure will be repeated in the chemotherapy room today.  15 minutes were spent with the patient today. The majority of the time was used for  counseling and coordination of care.  Brianna Romberg, MD  07/06/2017  2:14 PM

## 2017-07-07 LAB — T3 UPTAKE
Free Thyroxine Index: 1.5 (ref 1.2–4.9)
T3 Uptake Ratio: 25 % (ref 24–39)

## 2017-07-07 LAB — T4, FREE: FREE T4: 1.12 ng/dL (ref 0.82–1.77)

## 2017-07-07 LAB — T4: T4 TOTAL: 6.1 ug/dL (ref 4.5–12.0)

## 2017-07-24 ENCOUNTER — Other Ambulatory Visit: Payer: Self-pay | Admitting: Oncology

## 2017-07-27 ENCOUNTER — Other Ambulatory Visit (HOSPITAL_BASED_OUTPATIENT_CLINIC_OR_DEPARTMENT_OTHER): Payer: Medicare Other

## 2017-07-27 ENCOUNTER — Ambulatory Visit (HOSPITAL_BASED_OUTPATIENT_CLINIC_OR_DEPARTMENT_OTHER): Payer: Medicare Other

## 2017-07-27 ENCOUNTER — Ambulatory Visit (HOSPITAL_BASED_OUTPATIENT_CLINIC_OR_DEPARTMENT_OTHER): Payer: Medicare Other | Admitting: Nurse Practitioner

## 2017-07-27 VITALS — BP 178/71 | HR 83 | Temp 98.2°F | Resp 18 | Ht 63.0 in | Wt 114.4 lb

## 2017-07-27 DIAGNOSIS — L299 Pruritus, unspecified: Secondary | ICD-10-CM

## 2017-07-27 DIAGNOSIS — R209 Unspecified disturbances of skin sensation: Secondary | ICD-10-CM | POA: Diagnosis not present

## 2017-07-27 DIAGNOSIS — C787 Secondary malignant neoplasm of liver and intrahepatic bile duct: Secondary | ICD-10-CM

## 2017-07-27 DIAGNOSIS — C184 Malignant neoplasm of transverse colon: Secondary | ICD-10-CM

## 2017-07-27 DIAGNOSIS — Z5112 Encounter for antineoplastic immunotherapy: Secondary | ICD-10-CM | POA: Diagnosis present

## 2017-07-27 LAB — CBC WITH DIFFERENTIAL/PLATELET
BASO%: 0.4 % (ref 0.0–2.0)
BASOS ABS: 0.1 10*3/uL (ref 0.0–0.1)
EOS%: 0.6 % (ref 0.0–7.0)
Eosinophils Absolute: 0.1 10*3/uL (ref 0.0–0.5)
HEMATOCRIT: 34.1 % — AB (ref 34.8–46.6)
HEMOGLOBIN: 11.5 g/dL — AB (ref 11.6–15.9)
LYMPH#: 2.6 10*3/uL (ref 0.9–3.3)
LYMPH%: 22.7 % (ref 14.0–49.7)
MCH: 32.9 pg (ref 25.1–34.0)
MCHC: 33.7 g/dL (ref 31.5–36.0)
MCV: 97.4 fL (ref 79.5–101.0)
MONO#: 0.9 10*3/uL (ref 0.1–0.9)
MONO%: 7.8 % (ref 0.0–14.0)
NEUT#: 7.9 10*3/uL — ABNORMAL HIGH (ref 1.5–6.5)
NEUT%: 68.5 % (ref 38.4–76.8)
PLATELETS: 286 10*3/uL (ref 145–400)
RBC: 3.5 10*6/uL — ABNORMAL LOW (ref 3.70–5.45)
RDW: 13.1 % (ref 11.2–14.5)
WBC: 11.5 10*3/uL — ABNORMAL HIGH (ref 3.9–10.3)
nRBC: 0 % (ref 0–0)

## 2017-07-27 LAB — COMPREHENSIVE METABOLIC PANEL
ALBUMIN: 3.9 g/dL (ref 3.5–5.0)
ALK PHOS: 59 U/L (ref 40–150)
ALT: 18 U/L (ref 0–55)
ANION GAP: 9 meq/L (ref 3–11)
AST: 19 U/L (ref 5–34)
BUN: 16.1 mg/dL (ref 7.0–26.0)
CALCIUM: 9.6 mg/dL (ref 8.4–10.4)
CO2: 26 mEq/L (ref 22–29)
Chloride: 99 mEq/L (ref 98–109)
Creatinine: 0.8 mg/dL (ref 0.6–1.1)
EGFR: 67 mL/min/{1.73_m2} — AB (ref >90)
Glucose: 96 mg/dl (ref 70–140)
POTASSIUM: 4.7 meq/L (ref 3.5–5.1)
Sodium: 134 mEq/L — ABNORMAL LOW (ref 136–145)
Total Bilirubin: 2.13 mg/dL — ABNORMAL HIGH (ref 0.20–1.20)
Total Protein: 7.7 g/dL (ref 6.4–8.3)

## 2017-07-27 MED ORDER — SODIUM CHLORIDE 0.9 % IV SOLN
200.0000 mg | Freq: Once | INTRAVENOUS | Status: AC
  Administered 2017-07-27: 200 mg via INTRAVENOUS
  Filled 2017-07-27: qty 8

## 2017-07-27 MED ORDER — SODIUM CHLORIDE 0.9 % IV SOLN
Freq: Once | INTRAVENOUS | Status: AC
  Administered 2017-07-27: 14:00:00 via INTRAVENOUS

## 2017-07-27 MED ORDER — AMLODIPINE BESYLATE 2.5 MG PO TABS: 2.5000 mg | ORAL_TABLET | Freq: Every day | ORAL | 0 refills | Status: AC

## 2017-07-27 NOTE — Patient Instructions (Signed)
[facility] Discharge Instructions for Patients Receiving Chemotherapy  Today you received the following chemotherapy agents:  Keytruda.  To help prevent nausea and vomiting after your treatment, we encourage you to take your nausea medication as prescribed.   If you develop nausea and vomiting that is not controlled by your nausea medication, call the clinic.   BELOW ARE SYMPTOMS THAT SHOULD BE REPORTED IMMEDIATELY:  *FEVER GREATER THAN 100.5 F  *CHILLS WITH OR WITHOUT FEVER  NAUSEA AND VOMITING THAT IS NOT CONTROLLED WITH YOUR NAUSEA MEDICATION  *UNUSUAL SHORTNESS OF BREATH  *UNUSUAL BRUISING OR BLEEDING  TENDERNESS IN MOUTH AND THROAT WITH OR WITHOUT PRESENCE OF ULCERS  *URINARY PROBLEMS  *BOWEL PROBLEMS  UNUSUAL RASH Items with * indicate a potential emergency and should be followed up as soon as possible.  Feel free to call the clinic you have any questions or concerns. The clinic phone number is [phone].  Please show the CHEMO ALERT CARD at check-in to the Emergency Department and triage nurse.   

## 2017-07-27 NOTE — Progress Notes (Signed)
[facility] OFFICE PROGRESS NOTE   Diagnosis:  Colon cancer  INTERVAL HISTORY:   Brianna [name] returns as scheduled. She completed another treatment with Pembrolizumab 07/06/2017. She feels well. No nausea or vomiting. No mouth sores. No diarrhea. No rash. She continues to have intermittent numbness in the right arm. No associated weakness.  Objective:  Vital signs in last 24 hours:  Blood pressure (!) 178/71, pulse 83, temperature 98.2 F (36.8 C), temperature source Oral, resp. rate 18, height 5' 3"  (1.6 m), weight 114 lb 6.4 oz (51.9 kg), SpO2 100 %.    HEENT: No thrush or ulcers. Resp: Lungs clear bilaterally. Cardio: Regular rate and rhythm. GI: Abdomen soft and nontender. No hepatosplenomegaly. Vascular: No leg edema. Skin: No rash.    Lab Results:  Lab Results  Component Value Date   WBC 11.5 (H) 07/27/2017   HGB 11.5 (L) 07/27/2017   HCT 34.1 (L) 07/27/2017   MCV 97.4 07/27/2017   PLT 286 07/27/2017   NEUTROABS 7.9 (H) 07/27/2017    Imaging:  No results found.  Medications: I have reviewed the patient's current medications.  Assessment/Plan: 1. Admission with fever and liver abscesses 06/19/2016  Status post placement of a liver drain on 06/21/2010, New drain placed 06/30/2016  Treated with broad-spectrum intravenous antibiotics, completed outpatient course of Augmentin   CT 07/20/2016-no residual abscess identified  CT 10/20/2016-improved appearance of the liver; no new liver lesions; apple core type lesion involving the mid transverse colon continuing for approximately 5.6 cm. No obstruction. Probable transmural disease extension especially anteriorly. Suspected enlarged nodes at the root of the transverse mesocolon; no pelvic sidewall adenopathy.  Chest CT 10/16/2017-interval development of multifocal ill-defined low attenuation lesions within the liver suspicious for recurrent abscesses.  Abdomen/pelvis CT 12/17/2016-numerous new  scattered hypodense hepatic lesions. Necrotic conglomerate central mesenteric adenopathy. Questionable omental deposits of tumor. Borderline adenopathy in the retrocrural region bilaterally. Diffuse mesenteric edema.  2. 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 Hemoglobin in normal range 05/25/2017.  4. Hyperbilirubinemia secondary to hemolysis and liver abscesses  5. CT abdomen/pelvis 10/20/2016-improved appearance of the liver; no new liver lesions; apple core type lesion involving the mid transverse colon continuing for approximately 5.6 cm. No obstruction. Probable transmural disease extension especially anteriorly. Suspected enlarged nodes at the root of the transverse mesocolon;no pelvic sidewall adenopathy.  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 Necrotic conglomerate central mesenteric adenopathy. Questionable omental deposits of tumor. Borderline adenopathy in the retrocrural region bilaterally. Diffuse mesenteric  edema.  Cycle 1 Pembrolizumab 12/22/2016  Cycle 2 Pembrolizumab01/31/2018  Cycle 3 Pembrolizumab 02/09/2017  Cycle 4 Pembrolizumab 03/02/2017  CTs 03/21/2017-decrease in liver lesions, resolution of mesenteric adenopathy, new nonspecific tiny right lower lobe nodule  Cycle 5 Pembrolizumab 03/23/2017   Cycle 6  Pembrolizumab 04/13/2017  Cycle7 Pembrolizumab 05/04/2017  Cycle 8 Pembrolizumab 05/25/2017  Cycle 9 Pembrolizumab07/05/2017  Cycle 10 Pembrolizumab 07/06/2017   Cycle 11 Pembrolizumab 07/27/2017  6. 6 month history of left mid back pain with recent worsening. CT chest 12/16/2016 showed no specific etiology for the pain. Interval development of multifocal ill-defined lesions within the liver suspicious for recurrent abscesses noted. 13 mm nodular density right middle lobe likely post infectious or inflammatory. Multifocal areas of bronchiectasis.12/17/2016-the pain may be related to the mesenteric mass. Pain resolved with Pembrolizumab treatment  7. Intermittent pruritus. No rash.     Disposition: Brianna [name] appears well. She has completed 10 cycles of Pembrolizumab. There is no clinical evidence of disease progression. Plan to proceed with cycle 11 today as scheduled.  She is relocating to [place]. She has an appointment with her oncologist at [facility] on 08/05/2017. We will be happy to see her in the future if she returns to this area.    Ned Card ANP/GNP-BC   07/27/2017  12:24 PM

## 2018-06-09 IMAGING — CT CT ABD-PELV W/ CM
2 of 5 series · 15 of 46 positions shown, 17 images · IV contrast (ISOVUE 300)
Comparison: Multiple priors, including 07/20/2016.

CLINICAL DATA: Persistent abdominal pain. History of multiple liver
abscesses with drains in [REDACTED] in [REDACTED].

EXAM:
CT ABDOMEN AND PELVIS WITH CONTRAST
TECHNIQUE: Multidetector CT imaging of the abdomen and pelvis was performed
using the standard protocol following bolus administration of
intravenous contrast.
CONTRAST:  100mL [id number] IOPAMIDOL (PP4JRZ-2OO) INJECTION 61%

[Series 2: abd/pel w · axial · 0.68mm/px · z∈[-350,-10]mm · 12 of 78 slices shown, 14 images]
[im 5/78  soft-tissue]
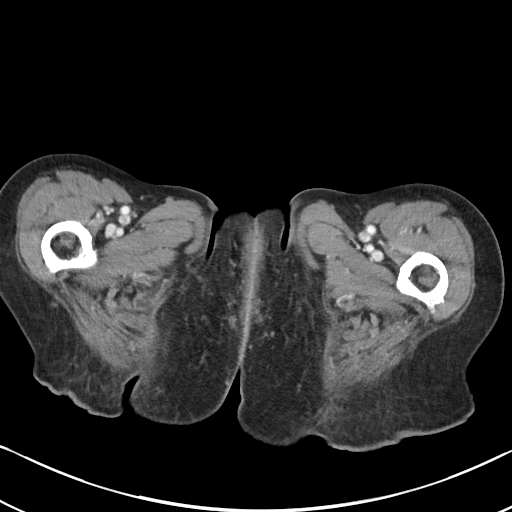
[im 5/78  bone]
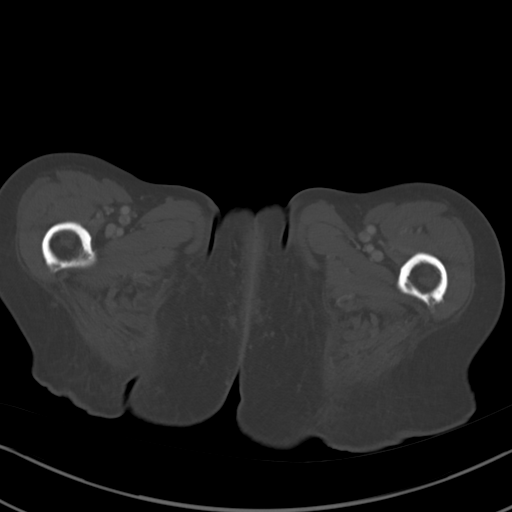
[im 13/78  soft-tissue]
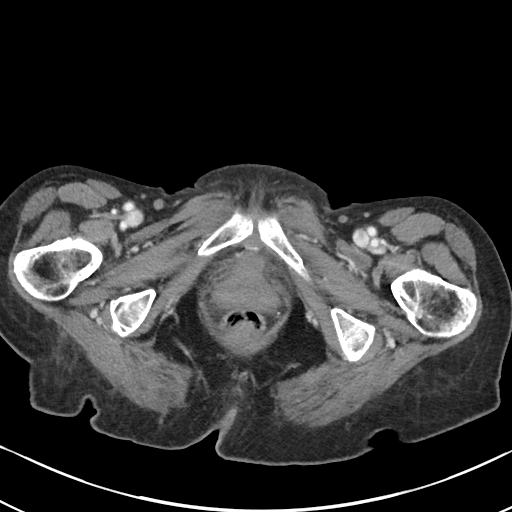
[im 17/78  soft-tissue]
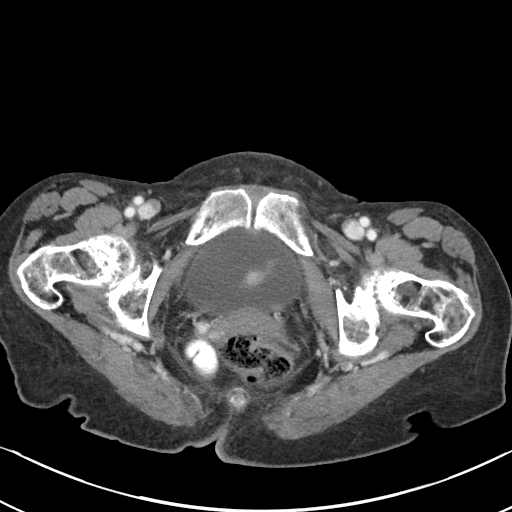
[im 25/78  soft-tissue]
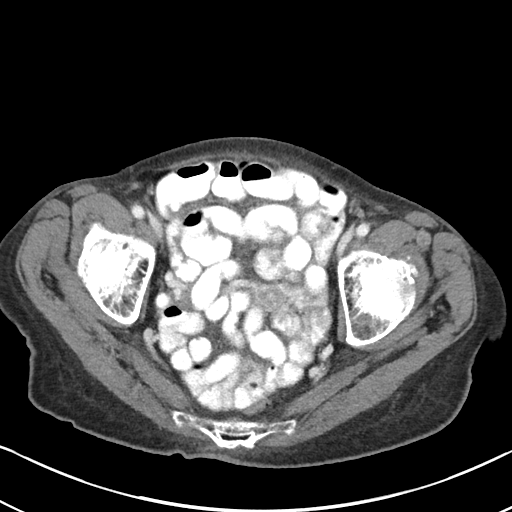
[im 29/78  soft-tissue]
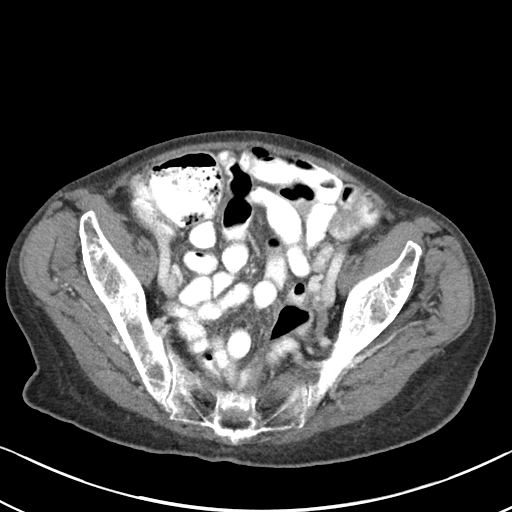
[im 37/78  soft-tissue]
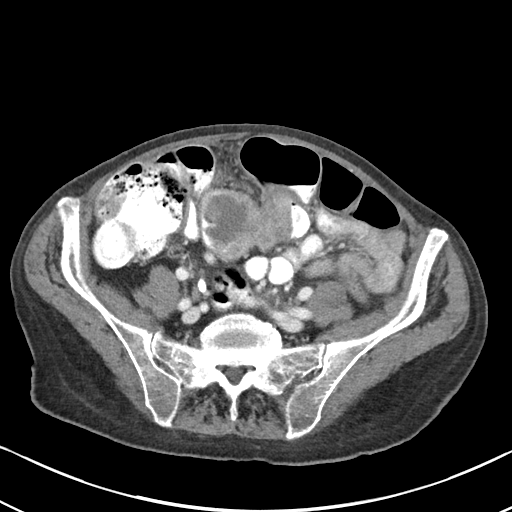
[im 41/78  soft-tissue]
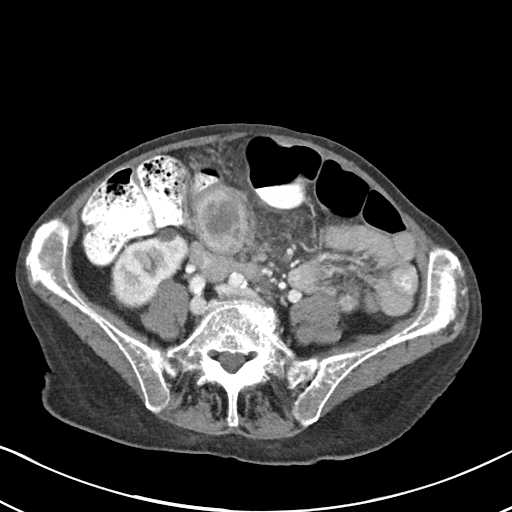
[im 49/78  soft-tissue]
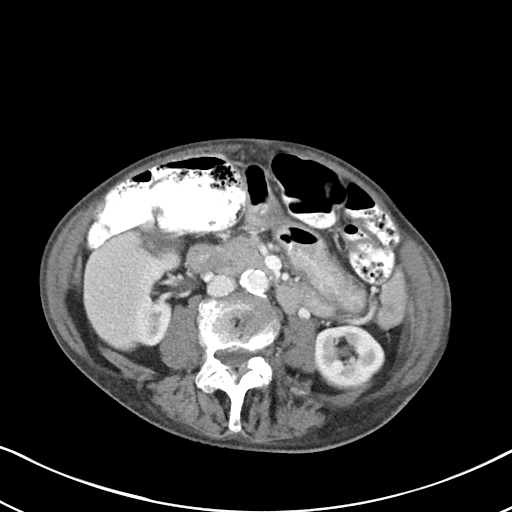
[im 53/78  soft-tissue]
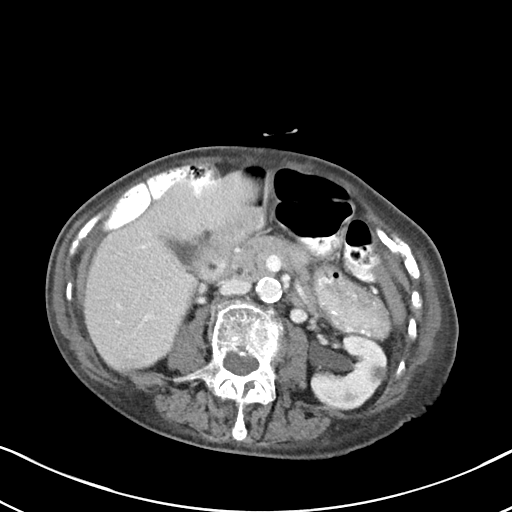
[im 53/78  bone]
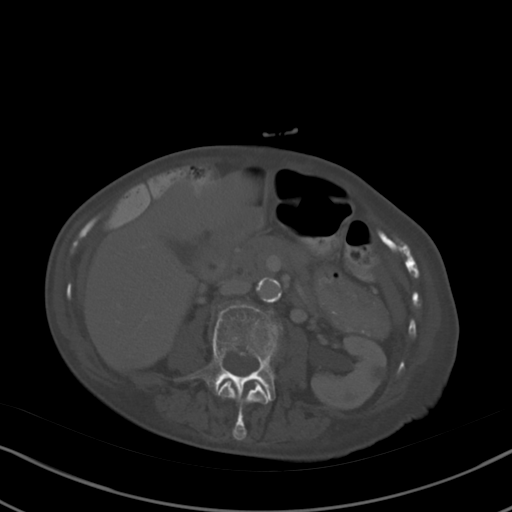
[im 61/78  soft-tissue]
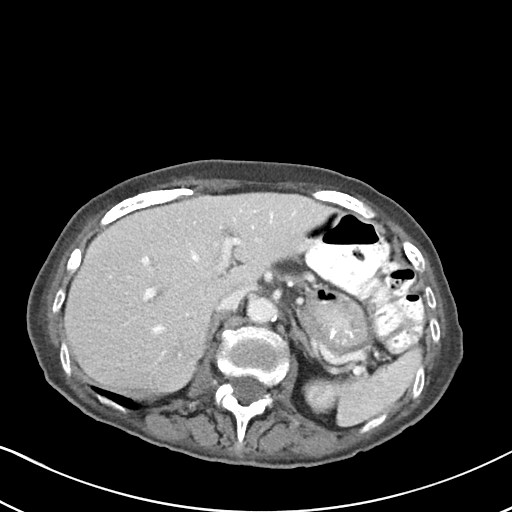
[im 65/78  soft-tissue]
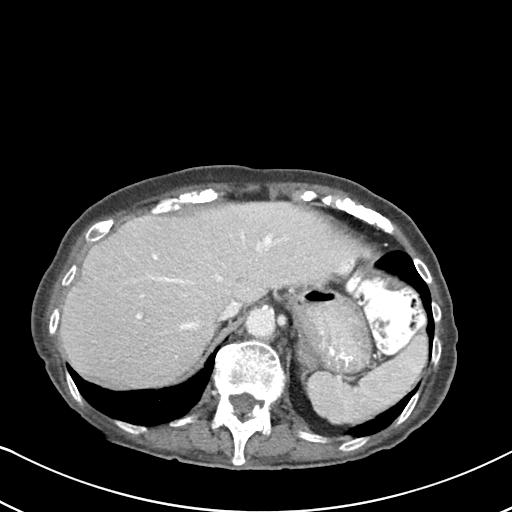
[im 73/78  soft-tissue]
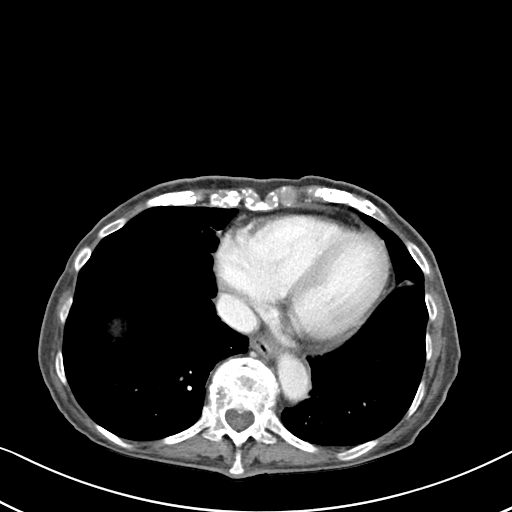

[Series 6: abd/pel w st · coronal · 0.62mm/px · 3 of 78 slices shown]
[im 26/78  soft-tissue]
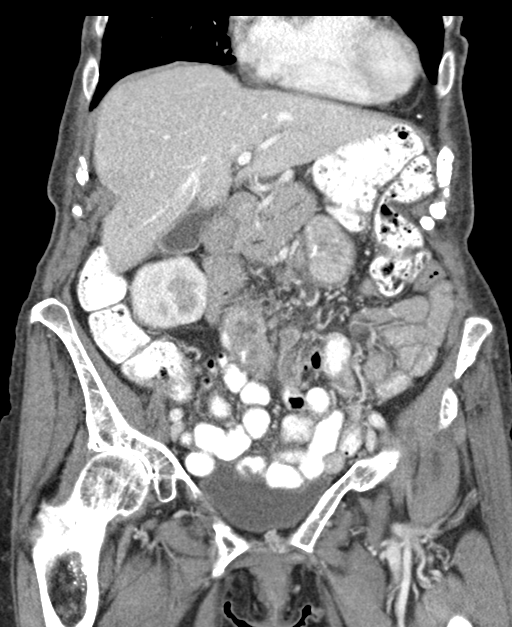
[im 35/78  soft-tissue]
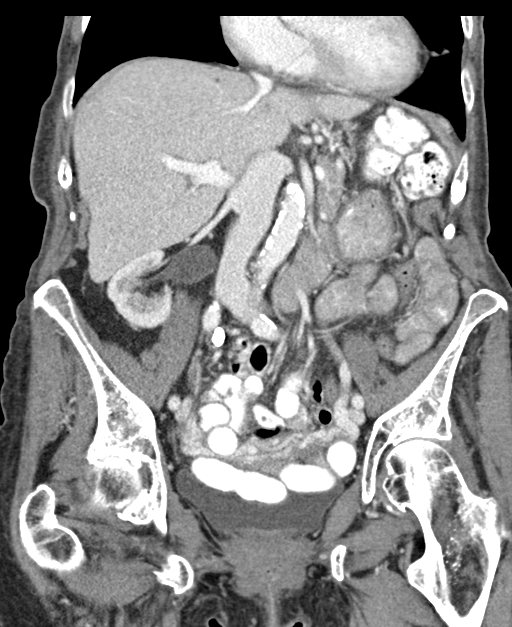
[im 43/78  soft-tissue]
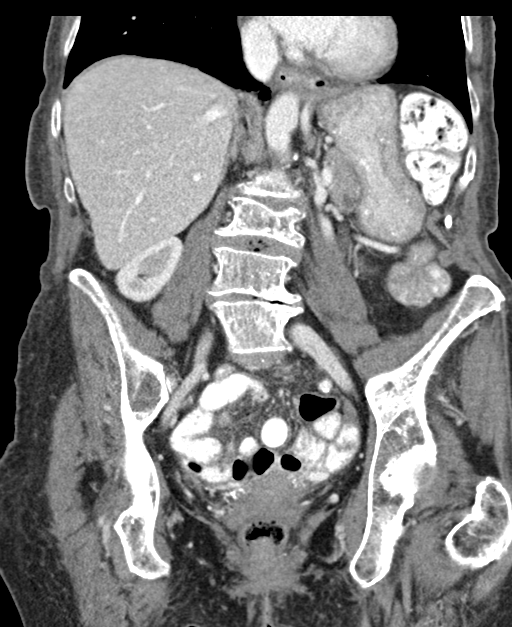

[15 of 46 positions shown; findings below may reference images not displayed]

FINDINGS: Lower chest: Likely post infectious or inflammatory volume loss in
the right middle lobe and less so lingula with mild bronchiectasis
in the right middle lobe. Mild cardiomegaly, without pericardial or
pleural effusion. Tiny hiatal hernia.

Hepatobiliary: Improved appearance the liver, with only subtle
hypoattenuation on image 16/series 2 in the right lobe, at the site
of percutaneous drain previously. No new liver lesions. Normal
gallbladder, without biliary ductal dilatation.

Pancreas: Small low-density pancreatic lesions are of doubtful
clinical significance given patient age and comorbidities. Example
similar at 8 mm within the pancreatic body on image 23/series 2. No
evidence of acute pancreatitis.

Spleen: Normal in size, without focal abnormality.

Adrenals/Urinary Tract: Normal adrenal glands. Too small to
characterize lesions in both kidneys. Interpolar left renal lesion
measures greater than fluid density, including at 1.2 cm on image
13/series 5. No hydronephrosis. Normal urinary bladder.

Stomach/Bowel: Normal remainder of the stomach. Apple core type
lesion involves the mid transverse colon, including on image
43/series 2. This continues for an approximately 5.6 cm distance. No
obstruction. Probable transmural disease extension, especially
anteriorly on image 44/series 2. Normal terminal ileum. Normal small
bowel.

Vascular/Lymphatic: Aortic and branch vessel atherosclerosis. No
retroperitoneal or retrocrural adenopathy. Suspect enlarged nodes at
the root of the transverse mesocolon. Example at 9 mm on image
33/series 2. No pelvic sidewall adenopathy.

Reproductive: Normal uterus and adnexa.

Other: No significant free fluid. Pelvic floor laxity. No evidence
of omental or peritoneal disease.

Musculoskeletal: Osteopenia. Compression deformities which are mild
at L3, moderate L1, and severe T11, similar.
IMPRESSION: 1. Findings consistent with locally advanced transverse colon
carcinoma. Apple core type lesion with transmural extension.
2. Suspicion of nodal metastasis at the root of the transverse
mesocolon. These results will be called to the ordering clinician or
representative by the Radiologist Assistant, and communication
documented in the PACS or zVision Dashboard.
3.  Aortic atherosclerosis.
4. Indeterminate left renal lesion. Given well-circumscribed
appearance, most likely a complex cyst. This could be re-evaluated
at follow-up and is of questionable clinical significance given
patient age and comorbidities.

## 2019-01-27 ENCOUNTER — Other Ambulatory Visit: Payer: Self-pay | Admitting: Nurse Practitioner

## 2022-10-13 DEATH — deceased
